# Patient Record
Sex: Female | Born: 1955
Health system: Southern US, Community
[De-identification: ages and names within clinical notes are randomized; demographics above are authoritative.]

## PROBLEM LIST (undated history)

## (undated) DIAGNOSIS — D689 Coagulation defect, unspecified: Secondary | ICD-10-CM

## (undated) DIAGNOSIS — S52123A Displaced fracture of head of unspecified radius, initial encounter for closed fracture: Secondary | ICD-10-CM

## (undated) DIAGNOSIS — I82409 Acute embolism and thrombosis of unspecified deep veins of unspecified lower extremity: Secondary | ICD-10-CM

## (undated) DIAGNOSIS — F32A Depression, unspecified: Secondary | ICD-10-CM

## (undated) DIAGNOSIS — K2210 Ulcer of esophagus without bleeding: Secondary | ICD-10-CM

## (undated) DIAGNOSIS — D6851 Activated protein C resistance: Secondary | ICD-10-CM

## (undated) DIAGNOSIS — T7840XA Allergy, unspecified, initial encounter: Secondary | ICD-10-CM

## (undated) DIAGNOSIS — C4491 Basal cell carcinoma of skin, unspecified: Secondary | ICD-10-CM

## (undated) DIAGNOSIS — K219 Gastro-esophageal reflux disease without esophagitis: Secondary | ICD-10-CM

## (undated) DIAGNOSIS — F419 Anxiety disorder, unspecified: Secondary | ICD-10-CM

## (undated) DIAGNOSIS — H269 Unspecified cataract: Secondary | ICD-10-CM

## (undated) DIAGNOSIS — K2970 Gastritis, unspecified, without bleeding: Secondary | ICD-10-CM

## (undated) DIAGNOSIS — M1990 Unspecified osteoarthritis, unspecified site: Secondary | ICD-10-CM

## (undated) DIAGNOSIS — E785 Hyperlipidemia, unspecified: Secondary | ICD-10-CM

## (undated) DIAGNOSIS — I10 Essential (primary) hypertension: Secondary | ICD-10-CM

## (undated) DIAGNOSIS — F329 Major depressive disorder, single episode, unspecified: Secondary | ICD-10-CM

## (undated) HISTORY — DX: Essential (primary) hypertension: I10

## (undated) HISTORY — DX: Acute embolism and thrombosis of unspecified deep veins of unspecified lower extremity: I82.409

## (undated) HISTORY — DX: Hyperlipidemia, unspecified: E78.5

## (undated) HISTORY — PX: SMALL INTESTINE SURGERY: SHX150

## (undated) HISTORY — DX: Gastritis, unspecified, without bleeding: K29.70

## (undated) HISTORY — DX: Ulcer of esophagus without bleeding: K22.10

## (undated) HISTORY — DX: Displaced fracture of head of unspecified radius, initial encounter for closed fracture: S52.123A

## (undated) HISTORY — PX: BREAST SURGERY: SHX581

## (undated) HISTORY — DX: Activated protein C resistance: D68.51

## (undated) HISTORY — PX: OOPHORECTOMY: SHX86

## (undated) HISTORY — DX: Anxiety disorder, unspecified: F41.9

## (undated) HISTORY — DX: Coagulation defect, unspecified: D68.9

## (undated) HISTORY — PX: UPPER GASTROINTESTINAL ENDOSCOPY: SHX188

## (undated) HISTORY — DX: Basal cell carcinoma of skin, unspecified: C44.91

## (undated) HISTORY — DX: Depression, unspecified: F32.A

## (undated) HISTORY — DX: Allergy, unspecified, initial encounter: T78.40XA

## (undated) HISTORY — PX: COLONOSCOPY: SHX174

## (undated) HISTORY — DX: Unspecified osteoarthritis, unspecified site: M19.90

## (undated) HISTORY — PX: CHOLECYSTECTOMY: SHX55

## (undated) HISTORY — DX: Unspecified cataract: H26.9

## (undated) HISTORY — DX: Gastro-esophageal reflux disease without esophagitis: K21.9

## (undated) HISTORY — DX: Major depressive disorder, single episode, unspecified: F32.9

---

## 1974-05-13 HISTORY — PX: TONSILLECTOMY: SUR1361

## 1988-05-13 DIAGNOSIS — K2970 Gastritis, unspecified, without bleeding: Secondary | ICD-10-CM

## 1988-05-13 HISTORY — DX: Gastritis, unspecified, without bleeding: K29.70

## 2003-01-26 ENCOUNTER — Encounter: Payer: Self-pay | Admitting: Family Medicine

## 2003-01-26 ENCOUNTER — Encounter: Admission: RE | Admit: 2003-01-26 | Discharge: 2003-01-26 | Payer: Self-pay | Admitting: Family Medicine

## 2003-01-27 ENCOUNTER — Encounter (INDEPENDENT_AMBULATORY_CARE_PROVIDER_SITE_OTHER): Payer: Self-pay | Admitting: Specialist

## 2003-01-27 ENCOUNTER — Observation Stay (HOSPITAL_COMMUNITY): Admission: AD | Admit: 2003-01-27 | Discharge: 2003-01-28 | Payer: Self-pay | Admitting: Surgery

## 2003-01-27 ENCOUNTER — Encounter: Payer: Self-pay | Admitting: Surgery

## 2003-03-17 ENCOUNTER — Encounter: Payer: Self-pay | Admitting: Internal Medicine

## 2003-05-03 ENCOUNTER — Encounter: Payer: Self-pay | Admitting: Internal Medicine

## 2003-05-03 LAB — HM COLONOSCOPY: HM Colonoscopy: NORMAL

## 2003-06-06 ENCOUNTER — Encounter: Payer: Self-pay | Admitting: Internal Medicine

## 2003-07-29 ENCOUNTER — Ambulatory Visit (HOSPITAL_COMMUNITY): Admission: RE | Admit: 2003-07-29 | Discharge: 2003-07-29 | Payer: Self-pay | Admitting: Internal Medicine

## 2003-08-31 ENCOUNTER — Ambulatory Visit (HOSPITAL_COMMUNITY): Admission: RE | Admit: 2003-08-31 | Discharge: 2003-08-31 | Payer: Self-pay | Admitting: Internal Medicine

## 2004-04-27 ENCOUNTER — Ambulatory Visit: Payer: Self-pay | Admitting: Internal Medicine

## 2004-05-15 ENCOUNTER — Ambulatory Visit: Payer: Self-pay | Admitting: Family Medicine

## 2005-02-21 ENCOUNTER — Ambulatory Visit: Payer: Self-pay | Admitting: Family Medicine

## 2005-02-22 ENCOUNTER — Encounter: Admission: RE | Admit: 2005-02-22 | Discharge: 2005-02-22 | Payer: Self-pay | Admitting: Family Medicine

## 2005-03-07 ENCOUNTER — Ambulatory Visit: Payer: Self-pay | Admitting: Family Medicine

## 2005-04-26 ENCOUNTER — Ambulatory Visit: Payer: Self-pay | Admitting: Family Medicine

## 2005-10-28 ENCOUNTER — Emergency Department (HOSPITAL_COMMUNITY): Admission: EM | Admit: 2005-10-28 | Discharge: 2005-10-28 | Payer: Self-pay | Admitting: Emergency Medicine

## 2006-03-18 ENCOUNTER — Ambulatory Visit: Payer: Self-pay | Admitting: Family Medicine

## 2006-04-12 HISTORY — PX: SHOULDER SURGERY: SHX246

## 2006-04-29 ENCOUNTER — Ambulatory Visit (HOSPITAL_BASED_OUTPATIENT_CLINIC_OR_DEPARTMENT_OTHER): Admission: RE | Admit: 2006-04-29 | Discharge: 2006-04-29 | Payer: Self-pay | Admitting: Orthopaedic Surgery

## 2006-05-01 ENCOUNTER — Ambulatory Visit: Payer: Self-pay | Admitting: Family Medicine

## 2006-05-04 ENCOUNTER — Emergency Department (HOSPITAL_COMMUNITY): Admission: EM | Admit: 2006-05-04 | Discharge: 2006-05-04 | Payer: Self-pay | Admitting: Emergency Medicine

## 2006-05-13 ENCOUNTER — Encounter: Payer: Self-pay | Admitting: Family Medicine

## 2006-05-13 DIAGNOSIS — I82409 Acute embolism and thrombosis of unspecified deep veins of unspecified lower extremity: Secondary | ICD-10-CM

## 2006-05-13 HISTORY — DX: Acute embolism and thrombosis of unspecified deep veins of unspecified lower extremity: I82.409

## 2006-05-13 LAB — CONVERTED CEMR LAB: Pap Smear: ABNORMAL

## 2006-05-14 LAB — HM HIV SCREENING LAB: HM HIV Screening: NEGATIVE

## 2006-06-05 ENCOUNTER — Encounter: Payer: Self-pay | Admitting: Vascular Surgery

## 2006-06-05 ENCOUNTER — Inpatient Hospital Stay (HOSPITAL_COMMUNITY): Admission: AD | Admit: 2006-06-05 | Discharge: 2006-06-07 | Payer: Self-pay | Admitting: Orthopaedic Surgery

## 2006-06-09 ENCOUNTER — Ambulatory Visit: Payer: Self-pay | Admitting: Family Medicine

## 2006-06-09 ENCOUNTER — Ambulatory Visit: Payer: Self-pay | Admitting: Internal Medicine

## 2006-06-09 LAB — CONVERTED CEMR LAB
Basophils Absolute: 0.1 10*3/uL (ref 0.0–0.1)
HCT: 39 % (ref 36.0–46.0)
Hemoglobin: 13.2 g/dL (ref 12.0–15.0)
Hgb A1c MFr Bld: 5.5 % (ref 4.6–6.0)
MCHC: 33.8 g/dL (ref 30.0–36.0)
Monocytes Relative: 6.8 % (ref 3.0–11.0)
Neutrophils Relative %: 58.4 % (ref 43.0–77.0)

## 2006-06-11 ENCOUNTER — Ambulatory Visit: Payer: Self-pay | Admitting: Family Medicine

## 2006-06-13 ENCOUNTER — Ambulatory Visit: Payer: Self-pay | Admitting: Family Medicine

## 2006-06-16 ENCOUNTER — Ambulatory Visit: Payer: Self-pay | Admitting: Family Medicine

## 2006-06-20 ENCOUNTER — Ambulatory Visit: Payer: Self-pay | Admitting: Family Medicine

## 2006-06-26 ENCOUNTER — Ambulatory Visit: Payer: Self-pay | Admitting: Family Medicine

## 2006-06-26 ENCOUNTER — Ambulatory Visit: Payer: Self-pay | Admitting: Internal Medicine

## 2006-06-30 ENCOUNTER — Ambulatory Visit: Payer: Self-pay | Admitting: Internal Medicine

## 2006-07-04 ENCOUNTER — Ambulatory Visit: Payer: Self-pay

## 2006-07-04 ENCOUNTER — Encounter: Payer: Self-pay | Admitting: Internal Medicine

## 2006-07-09 ENCOUNTER — Ambulatory Visit: Payer: Self-pay | Admitting: Family Medicine

## 2006-07-11 LAB — FECAL OCCULT BLOOD, GUAIAC: Fecal Occult Blood: NEGATIVE

## 2006-07-16 ENCOUNTER — Ambulatory Visit: Payer: Self-pay | Admitting: Internal Medicine

## 2006-07-16 ENCOUNTER — Ambulatory Visit: Payer: Self-pay | Admitting: Family Medicine

## 2006-08-06 ENCOUNTER — Ambulatory Visit: Payer: Self-pay | Admitting: Family Medicine

## 2006-08-07 ENCOUNTER — Ambulatory Visit: Payer: Self-pay | Admitting: Vascular Surgery

## 2006-08-07 ENCOUNTER — Ambulatory Visit (HOSPITAL_COMMUNITY): Admission: RE | Admit: 2006-08-07 | Discharge: 2006-08-07 | Payer: Self-pay | Admitting: Family Medicine

## 2006-08-07 ENCOUNTER — Encounter: Payer: Self-pay | Admitting: Vascular Surgery

## 2006-08-19 ENCOUNTER — Ambulatory Visit: Payer: Self-pay | Admitting: Family Medicine

## 2006-08-19 LAB — CONVERTED CEMR LAB
Cholesterol: 209 mg/dL (ref 0–200)
HDL: 51.7 mg/dL (ref >39.0)
Total CHOL/HDL Ratio: 4
Triglycerides: 118 mg/dL (ref 0–149)
VLDL: 24 mg/dL (ref 0–40)

## 2006-09-05 ENCOUNTER — Ambulatory Visit: Payer: Self-pay | Admitting: Family Medicine

## 2006-09-11 ENCOUNTER — Encounter: Payer: Self-pay | Admitting: Family Medicine

## 2006-09-12 ENCOUNTER — Telehealth (INDEPENDENT_AMBULATORY_CARE_PROVIDER_SITE_OTHER): Payer: Self-pay | Admitting: *Deleted

## 2006-09-12 ENCOUNTER — Ambulatory Visit: Payer: Self-pay | Admitting: Oncology

## 2006-10-01 ENCOUNTER — Ambulatory Visit: Payer: Self-pay | Admitting: Family Medicine

## 2006-10-01 LAB — CBC WITH DIFFERENTIAL (CANCER CENTER ONLY)
EOS%: 3 % (ref 0.0–7.0)
Eosinophils Absolute: 0.2 10*3/uL (ref 0.0–0.5)
HCT: 39.3 % (ref 34.8–46.6)
LYMPH#: 1.8 10*3/uL (ref 0.9–3.3)
MCV: 83 fL (ref 81–101)
MONO#: 0.2 10*3/uL (ref 0.1–0.9)
MONO%: 4 % (ref 0.0–13.0)
NEUT%: 59.1 % (ref 39.6–80.0)
Platelets: 176 10*3/uL (ref 145–400)
RBC: 4.72 10*6/uL (ref 3.70–5.32)
RDW: 12.6 % (ref 10.5–14.6)
WBC: 5.5 10*3/uL (ref 3.9–10.0)

## 2006-10-06 LAB — HYPERCOAGULABLE PANEL, COMPREHENSIVE
AntiThromb III Func: 102 % (ref 75–120)
Anticardiolipin IgA: <7 [APL'U] (ref <13)
Anticardiolipin IgG: <7 [GPL'U] (ref <11)
Anticardiolipin IgM: <7 [MPL'U] (ref <10)
Beta-2 Glyco I IgG: 82 U/mL (ref <20)
Beta-2-Glycoprotein I IgA: 29 U/mL (ref <10)
Beta-2-Glycoprotein I IgM: 42 U/mL (ref <10)
PTT Lupus Anticoagulant: 89.6 secs — ABNORMAL HIGH (ref 36.3–48.8)
PTTLA 4:1 Mix: 54.3 secs — ABNORMAL HIGH (ref 36.3–48.8)
PTTLA Confirmation: 0 secs (ref <8.0)
Protein C, Total: 77 % (ref 70–140)

## 2006-10-06 LAB — COMPREHENSIVE METABOLIC PANEL
ALT: 17 U/L (ref 0–35)
AST: 17 U/L (ref 0–37)
Alkaline Phosphatase: 79 U/L (ref 39–117)
Creatinine, Ser: 0.63 mg/dL (ref 0.40–1.20)
Sodium: 142 mEq/L (ref 135–145)
Total Bilirubin: 0.3 mg/dL (ref 0.3–1.2)
Total Protein: 6.6 g/dL (ref 6.0–8.3)

## 2006-10-06 LAB — FACTOR 5 LEIDEN: Activated Protein C Resistance: 0.47

## 2006-10-06 LAB — APTT: aPTT: 48 seconds — ABNORMAL HIGH (ref 24–37)

## 2006-10-08 ENCOUNTER — Ambulatory Visit: Payer: Self-pay | Admitting: Vascular Surgery

## 2006-10-29 ENCOUNTER — Ambulatory Visit: Payer: Self-pay | Admitting: Family Medicine

## 2006-10-29 DIAGNOSIS — E7800 Pure hypercholesterolemia, unspecified: Secondary | ICD-10-CM | POA: Insufficient documentation

## 2006-10-31 LAB — CONVERTED CEMR LAB
ALT: 14 units/L (ref 0–40)
AST: 17 units/L (ref 0–37)
Triglycerides: 64 mg/dL (ref 0–149)
VLDL: 13 mg/dL (ref 0–40)

## 2006-11-04 ENCOUNTER — Ambulatory Visit: Payer: Self-pay | Admitting: Internal Medicine

## 2006-11-05 ENCOUNTER — Encounter: Payer: Self-pay | Admitting: Family Medicine

## 2006-11-11 LAB — FACTOR V R2 DNA

## 2006-11-11 LAB — FACTOR 2 ASSAY: Factor II Activity: 19 % — ABNORMAL LOW (ref 81–115)

## 2006-11-19 ENCOUNTER — Ambulatory Visit: Payer: Self-pay | Admitting: Oncology

## 2006-11-20 ENCOUNTER — Encounter: Payer: Self-pay | Admitting: Family Medicine

## 2006-11-20 LAB — PROTIME-INR (CHCC SATELLITE)
INR: 3.2 (ref 2.0–3.5)
Protime: 38.4 Seconds — ABNORMAL HIGH (ref 10.6–13.4)

## 2006-11-25 ENCOUNTER — Encounter: Payer: Self-pay | Admitting: Oncology

## 2006-11-25 ENCOUNTER — Encounter: Payer: Self-pay | Admitting: Family Medicine

## 2006-11-25 ENCOUNTER — Ambulatory Visit: Payer: Self-pay | Admitting: Vascular Surgery

## 2006-11-25 ENCOUNTER — Ambulatory Visit (HOSPITAL_COMMUNITY): Admission: RE | Admit: 2006-11-25 | Discharge: 2006-11-25 | Payer: Self-pay | Admitting: Oncology

## 2006-11-25 DIAGNOSIS — K209 Esophagitis, unspecified without bleeding: Secondary | ICD-10-CM | POA: Insufficient documentation

## 2006-11-25 DIAGNOSIS — D239 Other benign neoplasm of skin, unspecified: Secondary | ICD-10-CM | POA: Insufficient documentation

## 2006-11-25 DIAGNOSIS — K2970 Gastritis, unspecified, without bleeding: Secondary | ICD-10-CM | POA: Insufficient documentation

## 2006-11-25 DIAGNOSIS — I471 Supraventricular tachycardia: Secondary | ICD-10-CM | POA: Insufficient documentation

## 2006-11-25 DIAGNOSIS — K2990 Gastroduodenitis, unspecified, without bleeding: Secondary | ICD-10-CM

## 2006-11-25 DIAGNOSIS — F418 Other specified anxiety disorders: Secondary | ICD-10-CM | POA: Insufficient documentation

## 2006-11-25 DIAGNOSIS — K589 Irritable bowel syndrome without diarrhea: Secondary | ICD-10-CM | POA: Insufficient documentation

## 2006-11-25 DIAGNOSIS — R945 Abnormal results of liver function studies: Secondary | ICD-10-CM | POA: Insufficient documentation

## 2006-11-25 DIAGNOSIS — Z87898 Personal history of other specified conditions: Secondary | ICD-10-CM | POA: Insufficient documentation

## 2006-11-25 DIAGNOSIS — F411 Generalized anxiety disorder: Secondary | ICD-10-CM | POA: Insufficient documentation

## 2006-11-25 DIAGNOSIS — Z86718 Personal history of other venous thrombosis and embolism: Secondary | ICD-10-CM | POA: Insufficient documentation

## 2006-11-26 ENCOUNTER — Ambulatory Visit: Payer: Self-pay | Admitting: Family Medicine

## 2006-11-26 LAB — CONVERTED CEMR LAB
Blood in Urine, dipstick: NEGATIVE
Glucose, Urine, Semiquant: NEGATIVE
Ketones, urine, test strip: NEGATIVE
Protein, U semiquant: NEGATIVE
WBC Urine, dipstick: NEGATIVE
pH: 7

## 2006-11-27 ENCOUNTER — Telehealth (INDEPENDENT_AMBULATORY_CARE_PROVIDER_SITE_OTHER): Payer: Self-pay | Admitting: *Deleted

## 2006-12-16 ENCOUNTER — Ambulatory Visit: Payer: Self-pay | Admitting: Family Medicine

## 2006-12-18 ENCOUNTER — Ambulatory Visit: Payer: Self-pay | Admitting: Family Medicine

## 2006-12-19 ENCOUNTER — Telehealth (INDEPENDENT_AMBULATORY_CARE_PROVIDER_SITE_OTHER): Payer: Self-pay | Admitting: *Deleted

## 2006-12-25 ENCOUNTER — Ambulatory Visit: Payer: Self-pay | Admitting: Family Medicine

## 2006-12-30 ENCOUNTER — Ambulatory Visit: Payer: Self-pay | Admitting: Family Medicine

## 2006-12-30 LAB — CONVERTED CEMR LAB
INR: 2.3
Prothrombin Time: 18.5 s

## 2007-01-27 ENCOUNTER — Ambulatory Visit: Payer: Self-pay | Admitting: Family Medicine

## 2007-01-27 LAB — CONVERTED CEMR LAB: Prothrombin Time: 17.3 s

## 2007-02-09 ENCOUNTER — Ambulatory Visit: Payer: Self-pay | Admitting: Family Medicine

## 2007-02-09 LAB — CONVERTED CEMR LAB
INR: 2.6
Prothrombin Time: 19.5 s

## 2007-02-23 ENCOUNTER — Ambulatory Visit: Payer: Self-pay | Admitting: Family Medicine

## 2007-02-23 LAB — CONVERTED CEMR LAB: INR: 2.4

## 2007-02-27 ENCOUNTER — Ambulatory Visit: Payer: Self-pay | Admitting: Family Medicine

## 2007-03-02 ENCOUNTER — Encounter: Payer: Self-pay | Admitting: Family Medicine

## 2007-03-03 ENCOUNTER — Ambulatory Visit: Payer: Self-pay | Admitting: Family Medicine

## 2007-03-18 ENCOUNTER — Telehealth: Payer: Self-pay | Admitting: Family Medicine

## 2007-03-20 ENCOUNTER — Telehealth (INDEPENDENT_AMBULATORY_CARE_PROVIDER_SITE_OTHER): Payer: Self-pay | Admitting: *Deleted

## 2007-03-20 ENCOUNTER — Encounter: Payer: Self-pay | Admitting: Family Medicine

## 2007-03-23 ENCOUNTER — Ambulatory Visit: Payer: Self-pay | Admitting: Family Medicine

## 2007-03-23 LAB — CONVERTED CEMR LAB: Prothrombin Time: 18.8 s

## 2007-04-20 ENCOUNTER — Ambulatory Visit: Payer: Self-pay | Admitting: Family Medicine

## 2007-04-20 LAB — CONVERTED CEMR LAB: INR: 2.2

## 2007-05-04 ENCOUNTER — Ambulatory Visit: Payer: Self-pay | Admitting: Family Medicine

## 2007-05-04 LAB — CONVERTED CEMR LAB: INR: 2.4

## 2007-05-14 LAB — CONVERTED CEMR LAB

## 2007-05-25 ENCOUNTER — Ambulatory Visit: Payer: Self-pay | Admitting: Oncology

## 2007-05-26 LAB — CBC WITH DIFFERENTIAL (CANCER CENTER ONLY)
BASO%: 0.9 % (ref 0.0–2.0)
EOS%: 2.3 % (ref 0.0–7.0)
Eosinophils Absolute: 0.2 10*3/uL (ref 0.0–0.5)
MCH: 28.4 pg (ref 26.0–34.0)
MCHC: 34 g/dL (ref 32.0–36.0)
MONO%: 5.9 % (ref 0.0–13.0)
NEUT#: 4.3 10*3/uL (ref 1.5–6.5)
Platelets: 207 10*3/uL (ref 145–400)
RBC: 4.82 10*6/uL (ref 3.70–5.32)
RDW: 12.4 % (ref 10.5–14.6)

## 2007-05-26 LAB — PROTIME-INR (CHCC SATELLITE): INR: 4.1 — ABNORMAL HIGH (ref 2.0–3.5)

## 2007-05-27 ENCOUNTER — Telehealth: Payer: Self-pay | Admitting: Family Medicine

## 2007-06-03 LAB — LUPUS ANTICOAGULANT PANEL: PTTLA Confirmation: 3.7 secs (ref <8.0)

## 2007-06-03 LAB — CARDIOLIPIN ANTIBODIES, IGG, IGM, IGA
Anticardiolipin IgG: <7 [GPL'U] (ref <11)
Anticardiolipin IgM: <7 [MPL'U] (ref <10)

## 2007-06-03 LAB — BETA-2 GLYCOPROTEIN ANTIBODIES
Beta-2 Glyco I IgG: <4 U/mL (ref <20)
Beta-2-Glycoprotein I IgA: <4 U/mL (ref <10)
Beta-2-Glycoprotein I IgM: 6 U/mL (ref <10)

## 2007-06-10 LAB — PROTIME-INR (CHCC SATELLITE): Protime: 27.6 Seconds — ABNORMAL HIGH (ref 10.6–13.4)

## 2007-06-19 ENCOUNTER — Encounter: Payer: Self-pay | Admitting: Oncology

## 2007-06-19 ENCOUNTER — Ambulatory Visit (HOSPITAL_COMMUNITY): Admission: RE | Admit: 2007-06-19 | Discharge: 2007-06-19 | Payer: Self-pay | Admitting: Oncology

## 2007-07-15 ENCOUNTER — Telehealth: Payer: Self-pay | Admitting: Family Medicine

## 2007-07-30 ENCOUNTER — Ambulatory Visit: Payer: Self-pay | Admitting: Oncology

## 2007-08-04 ENCOUNTER — Encounter: Payer: Self-pay | Admitting: Family Medicine

## 2007-09-28 ENCOUNTER — Telehealth: Payer: Self-pay | Admitting: Family Medicine

## 2007-10-07 ENCOUNTER — Encounter: Payer: Self-pay | Admitting: Family Medicine

## 2007-11-04 ENCOUNTER — Encounter: Payer: Self-pay | Admitting: Family Medicine

## 2007-12-01 ENCOUNTER — Ambulatory Visit: Payer: Self-pay | Admitting: Oncology

## 2007-12-03 ENCOUNTER — Encounter: Payer: Self-pay | Admitting: Family Medicine

## 2007-12-07 ENCOUNTER — Ambulatory Visit: Payer: Self-pay | Admitting: Family Medicine

## 2007-12-07 DIAGNOSIS — D6851 Activated protein C resistance: Secondary | ICD-10-CM | POA: Insufficient documentation

## 2007-12-08 ENCOUNTER — Telehealth: Payer: Self-pay | Admitting: Family Medicine

## 2007-12-09 ENCOUNTER — Telehealth (INDEPENDENT_AMBULATORY_CARE_PROVIDER_SITE_OTHER): Payer: Self-pay | Admitting: *Deleted

## 2007-12-15 ENCOUNTER — Encounter: Payer: Self-pay | Admitting: Family Medicine

## 2008-01-05 ENCOUNTER — Ambulatory Visit: Payer: Self-pay | Admitting: Family Medicine

## 2008-01-07 LAB — CONVERTED CEMR LAB
ALT: 16 units/L (ref 0–35)
AST: 17 units/L (ref 0–37)
Alkaline Phosphatase: 62 units/L (ref 39–117)
BUN: 11 mg/dL (ref 6–23)
Bilirubin, Direct: 0.1 mg/dL (ref 0.0–0.3)
CO2: 31 meq/L (ref 19–32)
Chloride: 112 meq/L (ref 96–112)
Eosinophils Relative: 3.8 % (ref 0.0–5.0)
GFR calc Af Amer: 113 mL/min
GFR calc non Af Amer: 93 mL/min
Glucose, Bld: 101 mg/dL — ABNORMAL HIGH (ref 70–99)
HCT: 37 % (ref 36.0–46.0)
Hemoglobin: 12.8 g/dL (ref 12.0–15.0)
Lymphocytes Relative: 29.1 % (ref 12.0–46.0)
Monocytes Relative: 5.3 % (ref 3.0–12.0)
Sodium: 144 meq/L (ref 135–145)
Total Bilirubin: 0.7 mg/dL (ref 0.3–1.2)

## 2008-03-16 ENCOUNTER — Ambulatory Visit: Payer: Self-pay | Admitting: Family Medicine

## 2008-04-04 ENCOUNTER — Ambulatory Visit: Payer: Self-pay | Admitting: Oncology

## 2008-04-11 ENCOUNTER — Encounter: Payer: Self-pay | Admitting: Family Medicine

## 2008-04-22 ENCOUNTER — Ambulatory Visit: Payer: Self-pay | Admitting: Family Medicine

## 2008-06-23 ENCOUNTER — Telehealth (INDEPENDENT_AMBULATORY_CARE_PROVIDER_SITE_OTHER): Payer: Self-pay | Admitting: Internal Medicine

## 2008-06-28 ENCOUNTER — Telehealth (INDEPENDENT_AMBULATORY_CARE_PROVIDER_SITE_OTHER): Payer: Self-pay | Admitting: Internal Medicine

## 2008-07-01 ENCOUNTER — Encounter (INDEPENDENT_AMBULATORY_CARE_PROVIDER_SITE_OTHER): Payer: Self-pay | Admitting: Internal Medicine

## 2008-09-09 ENCOUNTER — Telehealth: Payer: Self-pay | Admitting: Family Medicine

## 2008-09-29 ENCOUNTER — Ambulatory Visit: Payer: Self-pay | Admitting: Oncology

## 2008-10-03 ENCOUNTER — Encounter: Payer: Self-pay | Admitting: Family Medicine

## 2008-12-13 ENCOUNTER — Encounter: Payer: Self-pay | Admitting: Family Medicine

## 2008-12-30 ENCOUNTER — Telehealth: Payer: Self-pay | Admitting: Family Medicine

## 2009-02-09 ENCOUNTER — Ambulatory Visit: Payer: Self-pay | Admitting: Family Medicine

## 2009-04-14 ENCOUNTER — Ambulatory Visit: Payer: Self-pay | Admitting: Family Medicine

## 2009-04-14 DIAGNOSIS — K219 Gastro-esophageal reflux disease without esophagitis: Secondary | ICD-10-CM | POA: Insufficient documentation

## 2009-05-04 ENCOUNTER — Ambulatory Visit: Payer: Self-pay | Admitting: Family Medicine

## 2009-05-08 LAB — CONVERTED CEMR LAB
Alkaline Phosphatase: 62 units/L (ref 39–117)
Basophils Absolute: 0.1 10*3/uL (ref 0.0–0.1)
Basophils Relative: 1.3 % (ref 0.0–3.0)
Bilirubin, Direct: 0.1 mg/dL (ref 0.0–0.3)
CO2: 27 meq/L (ref 19–32)
Calcium: 9.3 mg/dL (ref 8.4–10.5)
Chloride: 108 meq/L (ref 96–112)
Eosinophils Absolute: 0.4 10*3/uL (ref 0.0–0.7)
Glucose, Bld: 86 mg/dL (ref 70–99)
HCT: 40 % (ref 36.0–46.0)
Hemoglobin: 13.4 g/dL (ref 12.0–15.0)
Lymphocytes Relative: 24.6 % (ref 12.0–46.0)
Monocytes Absolute: 0.4 10*3/uL (ref 0.1–1.0)
Potassium: 4.1 meq/L (ref 3.5–5.1)
RBC: 4.47 M/uL (ref 3.87–5.11)
Sodium: 141 meq/L (ref 135–145)
TSH: 1.74 microintl units/mL (ref 0.35–5.50)
WBC: 5.7 10*3/uL (ref 4.5–10.5)

## 2009-05-13 DIAGNOSIS — S52123A Displaced fracture of head of unspecified radius, initial encounter for closed fracture: Secondary | ICD-10-CM

## 2009-05-13 HISTORY — DX: Displaced fracture of head of unspecified radius, initial encounter for closed fracture: S52.123A

## 2009-05-13 LAB — CONVERTED CEMR LAB: Pap Smear: NORMAL

## 2009-05-22 ENCOUNTER — Ambulatory Visit: Payer: Self-pay | Admitting: Family Medicine

## 2009-05-22 DIAGNOSIS — M659 Unspecified synovitis and tenosynovitis, unspecified site: Secondary | ICD-10-CM | POA: Insufficient documentation

## 2009-05-22 DIAGNOSIS — M5430 Sciatica, unspecified side: Secondary | ICD-10-CM | POA: Insufficient documentation

## 2009-07-04 ENCOUNTER — Telehealth (INDEPENDENT_AMBULATORY_CARE_PROVIDER_SITE_OTHER): Payer: Self-pay | Admitting: *Deleted

## 2009-07-19 ENCOUNTER — Telehealth: Payer: Self-pay | Admitting: Family Medicine

## 2009-10-19 ENCOUNTER — Ambulatory Visit: Payer: Self-pay | Admitting: Family Medicine

## 2009-11-01 ENCOUNTER — Telehealth: Payer: Self-pay | Admitting: Family Medicine

## 2009-11-10 HISTORY — PX: MOHS SURGERY: SUR867

## 2009-11-23 ENCOUNTER — Encounter: Payer: Self-pay | Admitting: Family Medicine

## 2010-01-31 ENCOUNTER — Encounter: Payer: Self-pay | Admitting: Family Medicine

## 2010-02-17 ENCOUNTER — Ambulatory Visit: Payer: Self-pay | Admitting: Family Medicine

## 2010-03-15 ENCOUNTER — Telehealth: Payer: Self-pay | Admitting: Family Medicine

## 2010-03-22 ENCOUNTER — Ambulatory Visit: Payer: Self-pay | Admitting: Family Medicine

## 2010-05-02 ENCOUNTER — Telehealth: Payer: Self-pay | Admitting: Family Medicine

## 2010-05-15 ENCOUNTER — Encounter: Payer: Self-pay | Admitting: Internal Medicine

## 2010-05-30 ENCOUNTER — Ambulatory Visit
Admission: RE | Admit: 2010-05-30 | Discharge: 2010-05-30 | Payer: Self-pay | Source: Home / Self Care | Attending: Family Medicine | Admitting: Family Medicine

## 2010-05-30 ENCOUNTER — Other Ambulatory Visit: Payer: Self-pay | Admitting: Family Medicine

## 2010-05-30 DIAGNOSIS — S5290XA Unspecified fracture of unspecified forearm, initial encounter for closed fracture: Secondary | ICD-10-CM | POA: Insufficient documentation

## 2010-05-30 LAB — CBC WITH DIFFERENTIAL/PLATELET
Basophils Absolute: 0 10*3/uL (ref 0.0–0.1)
Basophils Relative: 0.5 % (ref 0.0–3.0)
Eosinophils Absolute: 0.2 10*3/uL (ref 0.0–0.7)
Eosinophils Relative: 2.9 % (ref 0.0–5.0)
HCT: 40.1 % (ref 36.0–46.0)
Hemoglobin: 13.4 g/dL (ref 12.0–15.0)
Lymphocytes Relative: 26.1 % (ref 12.0–46.0)
Lymphs Abs: 1.4 10*3/uL (ref 0.7–4.0)
MCHC: 33.4 g/dL (ref 30.0–36.0)
MCV: 87 fl (ref 78.0–100.0)
Monocytes Absolute: 0.4 10*3/uL (ref 0.1–1.0)
Monocytes Relative: 8.3 % (ref 3.0–12.0)
Neutro Abs: 3.3 10*3/uL (ref 1.4–7.7)
Neutrophils Relative %: 62.2 % (ref 43.0–77.0)
Platelets: 173 10*3/uL (ref 150.0–400.0)
RBC: 4.61 Mil/uL (ref 3.87–5.11)
RDW: 13.3 % (ref 11.5–14.6)
WBC: 5.3 10*3/uL (ref 4.5–10.5)

## 2010-05-30 LAB — HEPATIC FUNCTION PANEL
ALT: 18 U/L (ref 0–35)
AST: 17 U/L (ref 0–37)
Albumin: 3.9 g/dL (ref 3.5–5.2)
Alkaline Phosphatase: 72 U/L (ref 39–117)
Bilirubin, Direct: 0.1 mg/dL (ref 0.0–0.3)
Total Bilirubin: 0.5 mg/dL (ref 0.3–1.2)
Total Protein: 6.5 g/dL (ref 6.0–8.3)

## 2010-05-30 LAB — BASIC METABOLIC PANEL
BUN: 13 mg/dL (ref 6–23)
CO2: 30 mEq/L (ref 19–32)
Calcium: 9.5 mg/dL (ref 8.4–10.5)
Chloride: 108 mEq/L (ref 96–112)
Creatinine, Ser: 0.8 mg/dL (ref 0.4–1.2)
GFR: 82.49 mL/min (ref >60.00)
Glucose, Bld: 106 mg/dL — ABNORMAL HIGH (ref 70–99)
Potassium: 4.8 mEq/L (ref 3.5–5.1)
Sodium: 142 mEq/L (ref 135–145)

## 2010-05-30 LAB — TSH: TSH: 1.93 u[IU]/mL (ref 0.35–5.50)

## 2010-06-07 ENCOUNTER — Telehealth: Payer: Self-pay | Admitting: Internal Medicine

## 2010-06-08 ENCOUNTER — Encounter: Payer: Self-pay | Admitting: Family Medicine

## 2010-06-08 ENCOUNTER — Ambulatory Visit
Admission: RE | Admit: 2010-06-08 | Discharge: 2010-06-08 | Payer: Self-pay | Source: Home / Self Care | Attending: Family Medicine | Admitting: Family Medicine

## 2010-06-12 NOTE — Progress Notes (Signed)
Summary: t  Phone Note Call from Patient Call back at Home Phone [phone]   Caller: Patient Call For: Dr. Dayton Martes Summary of Call: Patient was seen about 3 weeks ago with respiratory symptoms.  She was given a Z-Pak.  She has never really gotten back to normal.  The symptoms improved but now are exacerbating again.  She has a raspy voice, throat hurts, ears hurt, especially the right one (up a few hours last night with a heating pad on it), lymph nodes in her neck were tender to touch over the weekend.  She took a Sudafed last night but only takes it sparingly because of her BP.  She also has taken a Claritin.  ? additional ABX.   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  [facility] [place] Initial call taken by: Delilah Shan CMA Duncan Dull),  November 01, 2009 9:41 AM  Follow-up for Phone Call        We can try 5 day course of Avelox. Ruthe Mannan MD  November 01, 2009 9:54 AM     New/Updated Medications: AVELOX 400 MG  TABS (MOXIFLOXACIN HCL) 1 tablet by mouth daily Prescriptions: AVELOX 400 MG  TABS (MOXIFLOXACIN HCL) 1 tablet by mouth daily  #5 x 0   Entered and Authorized by:   Ruthe Mannan MD   Signed by:   Ruthe Mannan MD on 11/01/2009   Method used:   Electronically [facility]        [facility]  [place] (retail)       [address]       [place], [place]  [address]       Ph: [phone] or [phone]       Fax: [phone]   RxID:   [id number]   Appended Document: t Patient advised via message left on cell phone voicemail, Rx sent [facility].

## 2010-06-12 NOTE — Progress Notes (Signed)
Summary: Bupropion XL  Phone Note Refill Request Message from:  [employer]line on March 15, 2010 12:29 PM  Refills Requested: Medication #1:  BUPROPION HCL 150 MG XR12H-TAB Take 1 tablet by mouth once a day. This was not originally on the meds list, added for refill purposes.   [facility]  [id number]   Last Fill Date:  No date sent   Pharmacy Phone:  [phone]   Method Requested: Electronic Initial call taken by: Delilah Shan CMA Duncan Dull),  March 15, 2010 12:29 PM  Follow-up for Phone Call        she has been on and off of it - verify she is back on  px written on EMR for call in  Follow-up by: Judith Part MD,  March 15, 2010 12:33 PM  Additional Follow-up for Phone Call Additional follow up Details #1::        Patient notified as instructed by telephone. Pt is taking Buproprion. [facility]  pharmacy as instructed. Lewanda Rife LPN  March 15, 2010 3:09 PM     New/Updated Medications: BUPROPION HCL 150 MG XR12H-TAB (BUPROPION HCL) Take 1 tablet by mouth once a day Prescriptions: BUPROPION HCL 150 MG XR12H-TAB (BUPROPION HCL) Take 1 tablet by mouth once a day  #30 x 11   Entered and Authorized by:   Judith Part MD   Signed by:   Lewanda Rife LPN on 63/87/5643   Method used:   Telephoned to ...       [facility]  [place] (retail)       [address]       [place], [place]  [address]       Ph: [phone] or [phone]       Fax: [phone]   RxID:   [id number]

## 2010-06-12 NOTE — Letter (Signed)
Summary: [facility]  [facility]   Imported By: Lanelle Bal 12/04/2009 09:47:00  _____________________________________________________________________  External Attachment:    Type:   Image     Comment:   External Document  Appended Document: [facility]     Clinical Lists Changes  Observations: Added new observation of PAST MED HX: DVT 1/08      Anxiety Depression DVT, hx of factor V Leiden heterozygote (on coag 1 year ending 09) basal cel carcinoma face       hematology---Dr Park Breed derm  (12/04/2009 22:00) Added new observation of PAST SURG HX: Cholecystectomy Oophorectomy Hospital- abd pain, gastritis (1990) EGD- esoph erosion Flex sig 9?1996) EGD- GERD, esophagitis (05/2002) Colonoscopy- neg (04/2003) Cardiolite- neg (04/2003) Heel dexa (10/20050 Shoulder surgery (04/2006) Stress cardiolite- neg  2D Echo- neg (06/2006) Moh's surgery for basal cell carcinoma near eye 7/11  (12/04/2009 22:00)       Past Medical History:    DVT 1/08         Anxiety    Depression    DVT, hx of    factor V Leiden heterozygote (on coag 1 year ending 09)    basal cel carcinoma face                            hematology---Dr Marlynn Perking   Past Surgical History:    Cholecystectomy    Oophorectomy    Hospital- abd pain, gastritis (1990)    EGD- esoph erosion    Flex sig 9?1996)    EGD- GERD, esophagitis (05/2002)    Colonoscopy- neg (04/2003)    Cardiolite- neg (04/2003)    Heel dexa (10/20050    Shoulder surgery (04/2006)    Stress cardiolite- neg  2D Echo- neg (06/2006)    Moh's surgery for basal cell carcinoma near eye 7/11

## 2010-06-12 NOTE — Assessment & Plan Note (Signed)
Summary: ST,EARS,EYES,COUGH/CLE   Vital Signs:  Patient profile:   55 year old female Height:      65.25 inches Temp:     98.7 degrees F oral Pulse rate:   68 / minute Pulse rhythm:   regular BP sitting:   130 / 90  (left arm) Cuff size:   regular  Vitals Entered By: Linde Gillis CMA Duncan Dull) (October 19, 2009 9:09 AM) CC: sore throat, ear pain, cough Comments patient refused to weigh   History of Present Illness: 55 yo with 9 days of URI symptoms, progressively worsening.  Started with a runny nose, congestion. Now coughing up phlegm, sinus pressure. No shortness of breath or wheezing.  Ears are popping and hurting.  Has tried Mucinex and cough suppressant.  Current Medications (verified): 1)  Zocor 20 Mg Tabs (Simvastatin) .Marland Kitchen.. 1 By Mouth Once Daily in Evening With A Low Fat Snack 2)  Allegra 180 Mg  Tabs (Fexofenadine Hcl) .... Take One By Mouth Daily As Needed 3)  Bayer Aspirin Ec Low Dose 81 Mg  Tbec (Aspirin) .Marland Kitchen.. 1 By Mouth Once Daily 4)  Mucinex Dm Maximum Strength 60-1200 Mg Xr12h-Tab (Dextromethorphan-Guaifenesin) .... Otc As Directed. 5)  Buspirone Hcl 15 Mg Tabs (Buspirone Hcl) .... 1/2 By Mouth Two Times A Day 6)  Prednisone 20 Mg Tabs (Prednisone) .... 2 Tabs By Mouth Daily 7)  Kenalog in Orabase Gel .... Apply To Cold Sore/ Mouth Ulcer Three Times A Day As Needed 8)  Azithromycin 250 Mg  Tabs (Azithromycin) .... 2 By  Mouth Today and Then 1 Daily For 4 Days  Allergies: 1)  ! Augmentin 2)  ! Erythromycin 3)  Zoloft 4)  Naprosyn 5)  * Calcium 6)  * Guaifenesin 7)  Augmentin 8)  Percocet 9)  Percodan  Review of Systems      See HPI General:  Complains of fever; denies chills. ENT:  Complains of earache, nasal congestion, postnasal drainage, sinus pressure, and sore throat. CV:  Denies chest pain or discomfort. Resp:  Complains of cough and sputum productive; denies shortness of breath and wheezing. GI:  Denies abdominal pain and change in bowel  habits.  Physical Exam  General:  Well-developed,well-nourished,in no acute distress; alert,appropriate and cooperative throughout examination Ears:  TMs retracted bilaterally Nose:  boggy turbinates frontal sinuses TTP Mouth:  Oral mucosa and oropharynx without lesions or exudates.  Teeth in good repair. Lungs:  normal respiratory effort.   no crackles and no wheezes.   Heart:  Normal rate and regular rhythm. S1 and S2 normal without gallop, murmur, click, rub or other extra sounds. Abdomen:  Bowel sounds positive,abdomen soft and non-tender without masses, organomegaly or hernias noted. no renal bruits  Extremities:  no c/c/e Psych:  Cognition and judgment appear intact. Alert and cooperative with normal attention span and concentration. No apparent delusions, illusions, hallucinations   Impression & Recommendations:  Problem # 1:  ACUTE FRONTAL SINUSITIS (ICD-461.1) Assessment New Given duration and progression of symptoms, will treat for bacterial sinusitis. has multiple abx allergies, using zpack. Her updated medication list for this problem includes:    Mucinex Dm Maximum Strength 60-1200 Mg Xr12h-tab (Dextromethorphan-guaifenesin) ..... Otc as directed.    Azithromycin 250 Mg Tabs (Azithromycin) .Marland Kitchen... 2 by  mouth today and then 1 daily for 4 days  Complete Medication List: 1)  Zocor 20 Mg Tabs (Simvastatin) .Marland Kitchen.. 1 by mouth once daily in evening with a low fat snack 2)  Allegra 180 Mg Tabs (Fexofenadine hcl) .Marland KitchenMarland KitchenMarland Kitchen  Take one by mouth daily as needed 3)  Bayer Aspirin Ec Low Dose 81 Mg Tbec (Aspirin) .Marland Kitchen.. 1 by mouth once daily 4)  Mucinex Dm Maximum Strength 60-1200 Mg Xr12h-tab (Dextromethorphan-guaifenesin) .... Otc as directed. 5)  Buspirone Hcl 15 Mg Tabs (Buspirone hcl) .... 1/2 by mouth two times a day 6)  Prednisone 20 Mg Tabs (Prednisone) .... 2 tabs by mouth daily 7)  Kenalog in Orabase Gel  .... Apply to cold sore/ mouth ulcer three times a day as needed 8)   Azithromycin 250 Mg Tabs (Azithromycin) .... 2 by  mouth today and then 1 daily for 4 days  Patient Instructions: 1)  Take Zpack as directed.  Drink lots of fluids.  Treat sympotmatically with Mucinex, nasal saline irrigation, and Tylenol/Ibuprofen. Also try claritin D or zyrtec D over the counter- two times a day as needed ( have to sign for them at pharmacy). You can use warm compresses.  Cough suppressant at night. Call if not improving as expected in 5-7 days.  Prescriptions: AZITHROMYCIN 250 MG  TABS (AZITHROMYCIN) 2 by  mouth today and then 1 daily for 4 days  #6 x 0   Entered and Authorized by:   Ruthe Mannan MD   Signed by:   Ruthe Mannan MD on 10/19/2009   Method used:   [facility]ronically to        [facility]  [place] (retail)       [address]       [place], [place]  [address]       Ph: [phone] or [phone]       Fax: [phone]   RxID:   [id number]   Current Allergies (reviewed today): ! AUGMENTIN ! ERYTHROMYCIN ZOLOFT NAPROSYN * CALCIUM * GUAIFENESIN AUGMENTIN PERCOCET PERCODAN

## 2010-06-12 NOTE — Progress Notes (Signed)
Summary: has fever blister  Phone Note Call from Patient Call back at Work Phone [phone]   Caller: Patient Call For: Judith Part MD Summary of Call: Pt states she has had a fever blister since monday morning and has a sore at the tip of her tongue since last night. She has had some mouth ulcers but they have cleared up.  She is asking if you recommend something otc or rx?  [facility]. Initial call taken by: Lowella Petties CMA,  July 19, 2009 10:26 AM  Follow-up for Phone Call        can try some kenalog in orabase gel - is not a cure but may speed up healing update me if not imp/ f/u px written on EMR for call in  Follow-up by: Judith Part MD,  July 19, 2009 11:48 AM  Additional Follow-up for Phone Call Additional follow up Details #1::        [facility] pt[facility] Additional Follow-up by: Lowella Petties CMA,  July 19, 2009 12:43 PM    New/Updated Medications: * KENALOG IN ORABASE GEL apply to cold sore/ mouth ulcer three times a day as needed Prescriptions: KENALOG IN ORABASE GEL apply to cold sore/ mouth ulcer three times a day as needed  #1 small x 1   Entered and Authorized by:   Judith Part MD   Signed by:   Lowella Petties CMA on 07/19/2009   Method used:   Telephoned to ...       [facility]  [place] (retail)       [address]       [place], [place]  [address]       Ph: [phone] or [phone]       Fax: [phone]   RxID:   [id number]

## 2010-06-12 NOTE — Assessment & Plan Note (Signed)
Summary: CONGESTION/DLO   Vital Signs:  Patient profile:   55 year old female Height:      65.25 inches Weight:      198 pounds BMI:     32.82 O2 Sat:      98 % Temp:     97.2 degrees F oral Pulse rate:   86 / minute BP sitting:   140 / 98  (left arm) Cuff size:   regular  Vitals Entered By: Jarome Lamas (February 17, 2010 12:08 PM) CC: sinus infection/pb   History of Present Illness: uri for 9 days - getting worse instead of better  thursday ached everywhere and a little diarrhea that is impoved  some tight chest and upper back - but not wheezing  cough prod of colored mucous --deep and rattly  headache and facial pain are worsening   no temp right now   is congested generally  blood and colored nasal mucous   working and trying to push through   tried some chlor tabs otc  today bp up a bit    Allergies: 1)  ! Augmentin 2)  ! Erythromycin 3)  Zoloft 4)  Naprosyn 5)  * Calcium 6)  * Guaifenesin 7)  Augmentin 8)  Percocet 9)  Percodan  Past History:  Past Medical History: Last updated: 12/04/2009 DVT 1/08      Anxiety Depression DVT, hx of factor V Leiden heterozygote (on coag 1 year ending 09) basal cel carcinoma face       hematology---Dr Park Breed derm   Past Surgical History: Last updated: 12/04/2009 Cholecystectomy Oophorectomy Hospital- abd pain, gastritis (1990) EGD- esoph erosion Flex sig 9?1996) EGD- GERD, esophagitis (05/2002) Colonoscopy- neg (04/2003) Cardiolite- neg (04/2003) Heel dexa (10/20050 Shoulder surgery (04/2006) Stress cardiolite- neg  2D Echo- neg (06/2006) Moh's surgery for basal cell carcinoma near eye 7/11  Family History: Last updated: 05/04/2009 Father: 3 MI's, CABG, CAD, DM, chol, PVD, HTN Mother: Grave's disease, depression, osteoporosis  Siblings: brother with MI' x2, CABG, DM, brother with HTN, sister with breast cancer, sister with HTN brother PE/blood clots, CAD with stents , atrial fib  bother died    Social History: Last updated: 12/07/2007 Marital Status: Married Children: 1 Occupation: nurse some walking for exercise  never smoked  rare alcohol   Review of Systems General:  Complains of chills, fatigue, fever, and malaise. Eyes:  Denies blurring and eye irritation. ENT:  Complains of nasal congestion, postnasal drainage, sinus pressure, and sore throat. CV:  Denies chest pain or discomfort and palpitations. Resp:  Complains of cough and sputum productive; denies pleuritic and shortness of breath. GI:  Complains of diarrhea; denies abdominal pain, nausea, and vomiting. Derm:  Denies itching and rash.  Physical Exam  General:  fatigued but well appearing  Head:  normocephalic, atraumatic, and no abnormalities observed.  bilat ethmoid and maxillary sinus tenderness   Eyes:  vision grossly intact, pupils equal, pupils round, pupils reactive to light, and no injection.   Mouth:  pharynx pink and moist.   Neck:  supple with full rom and no masses or thyromegally, no JVD or carotid bruit  Lungs:  Normal respiratory effort, chest expands symmetrically. Lungs are clear to auscultation, no crackles or wheezes. Heart:  Normal rate and regular rhythm. S1 and S2 normal without gallop, murmur, click, rub or other extra sounds. Skin:  Intact without suspicious lesions or rashes Cervical Nodes:  No lymphadenopathy noted Psych:  normal affect, talkative and pleasant    Impression &  Recommendations:  Problem # 1:  SINUSITIS - ACUTE-NOS (ICD-461.9) Assessment New  s/p uri with facial pain and malaise  pcn all  tx with levaquin and update recommend sympt care- see pt instructions   pt advised to update me if symptoms worsen or do not improve  Her updated medication list for this problem includes:    Mucinex Dm Maximum Strength 60-1200 Mg Xr12h-tab (Dextromethorphan-guaifenesin) ..... Otc as directed.    Levaquin 500 Mg Tabs (Levofloxacin) .Marland Kitchen... 1 by mouth once daily for 10  days  Orders: Prescription Created Electronically [phone])  Complete Medication List: 1)  Zocor 20 Mg Tabs (Simvastatin) .Marland Kitchen.. 1 by mouth once daily in evening with a low fat snack 2)  Allegra 180 Mg Tabs (Fexofenadine hcl) .... Take one by mouth daily as needed 3)  Bayer Aspirin Ec Low Dose 81 Mg Tbec (Aspirin) .Marland Kitchen.. 1 by mouth once daily 4)  Mucinex Dm Maximum Strength 60-1200 Mg Xr12h-tab (Dextromethorphan-guaifenesin) .... Otc as directed. 5)  Buspirone Hcl 15 Mg Tabs (Buspirone hcl) .... 1/2 by mouth two times a day 6)  Prednisone 20 Mg Tabs (Prednisone) .... 2 tabs by mouth daily 7)  Kenalog in Orabase Gel  .... Apply to cold sore/ mouth ulcer three times a day as needed 8)  Nexium 40 Mg Pack (Esomeprazole magnesium) .... Take one tablet daily 9)  Levaquin 500 Mg Tabs (Levofloxacin) .Marland Kitchen.. 1 by mouth once daily for 10 days  Patient Instructions: 1)  you can try mucinex over the counter twice daily as directed and nasal saline spray for congestion 2)  tylenol over the counter as directed may help with aches, headache and fever 3)  call if symptoms worsen or if not improved in 4-5 days  4)  take the levaquin as directed  Prescriptions: LEVAQUIN 500 MG TABS (LEVOFLOXACIN) 1 by mouth once daily for 10 days  #10 x 0   Entered and Authorized by:   Judith Part MD   Signed by:   Judith Part MD on 02/17/2010   Method used:   [facility]ically [facility]        [facility]  [place] (retail)       [address]       [place], [place]  [address]       Ph: [phone] or [phone]       Fax: [phone]   RxID:   [id number]

## 2010-06-12 NOTE — Progress Notes (Signed)
Summary: [place] [employer]  Phone Note Call from Patient Call back at [phone]   Caller: Patient Call For: [facility] Summary of Call: Pt. called to find out the date of her appt. w/ Dr.Copland.  I told her her appt. was yesterday.  She said she was waiting for the teleminder to call her,but never received the call and she didn't get the teleminder call for her last appt..   I switched her home # to her cell phone# to see if she gets the call for her next appt.. Initial call taken by: Beau Fanny,  July 04, 2009 9:51 AM

## 2010-06-12 NOTE — Assessment & Plan Note (Signed)
Summary: FLU SHOT/CLE   Nurse Visit   Allergies: 1)  ! Augmentin 2)  ! Erythromycin 3)  Zoloft 4)  Naprosyn 5)  * Calcium 6)  * Guaifenesin 7)  Augmentin 8)  Percocet 9)  Percodan  Orders Added: 1)  Admin 1st Vaccine [[address]] 2)  Flu Vaccine 76yrs + [[address]] Flu Vaccine Consent Questions     Do you have a history of severe allergic reactions to this vaccine? no    Any prior history of allergic reactions to egg and/or gelatin? no    Do you have a sensitivity to the preservative Thimersol? no    Do you have a past history of Guillan-Barre Syndrome? no    Do you currently have an acute febrile illness? no    Have you ever had a severe reaction to latex? no    Vaccine information given and explained to patient? yes    Are you currently pregnant? no    Lot Number:[id number]   Exp Date:11/10/2010   Site Given  Left Deltoid IM1

## 2010-06-12 NOTE — Assessment & Plan Note (Signed)
Summary: ARM and WRIST PAIN - ?TENDONITIS PER DR. Milinda Antis REFERRAL / [facility]   Vital Signs:  Patient profile:   55 year old female Height:      65.25 inches Weight:      200 pounds BMI:     33.15 Temp:     98.1 degrees F oral Pulse rate:   76 / minute Pulse rhythm:   regular BP sitting:   130 / 80  (left arm) Cuff size:   large  Vitals Entered By: Benny Lennert CMA Duncan Dull) (May 22, 2009 3:32 PM)  History of Present Illness: Chief complaint right elbow and wrist pain  55 year old female seen at the request of Dr. Milinda Antis for evaluation of right wrist and elbow pain:  1. Right and Left wrist pain: Starting August, was picking up some heavy charts for work repetitively and has been having pain in the wrists with forced deviation in multiple directions. No specific trauma or inciting event. No discrete swelling. Has been ongoing intermittently for months, recently flared with doing work outside.  3. Lateral epicondylitis, R: Over the last couple of months, has a deep pain in the lateral aspect of elbow, notably made worse with supination and flexion of the arm with the hand is in pronation. No trauma or accident. No known prior elbow injury. Points to maximal pain at the ECRB and forearm extensors.   RHD Had a partial thickness RTC tear, Glennis Brink did what sounds to be Arthroscopic SAD, DCE at that time.  4. Sciatica. Has had intermittent sciatica as well, decribes her buttocks pain and occaisional radiation pain down the posterior aspect of her thigh.  Allergies: 1)  ! Augmentin 2)  ! Erythromycin 3)  Zoloft 4)  Naprosyn 5)  * Calcium 6)  * Guaifenesin 7)  Augmentin 8)  Percocet 9)  Percodan  Past History:  Past medical, surgical, family and social histories (including risk factors) reviewed, and no changes noted (except as noted below).  Past Medical History: Reviewed history from 12/07/2007 and no changes required. DVT 1/08      Anxiety Depression DVT, hx of factor  V Leiden heterozygote (on coag 1 year ending 09)       hematology---Dr Marlynn Perking   Past Surgical History: Reviewed history from 11/25/2006 and no changes required. [facility]- abd pain, gastritis (1990) EGD- esoph erosion Flex sig 9?1996) EGD- GERD, esophagitis (05/2002) Colonoscopy- neg (04/2003) Cardiolite- neg (04/2003) Heel dexa (10/20050 Shoulder surgery (04/2006) Stress cardiolite- neg  2D Echo- neg (06/2006)  Family History: Reviewed history from 05/04/2009 and no changes required. Father: 3 MI's, CABG, CAD, DM, chol, PVD, HTN Mother: Grave's disease, depression, osteoporosis  Siblings: brother with MI' x2, CABG, DM, brother with HTN, sister with breast cancer, sister with HTN brother PE/blood clots, CAD with stents , atrial fib  bother died   Social History: Reviewed history from 12/07/2007 and no changes required. Marital Status: Married Children: 1 Occupation: Engineer, civil (consulting) some walking for exercise  never smoked  rare alcohol   Review of Systems       REVIEW OF SYSTEMS  GEN: No systemic complaints, no fevers, chills, sweats, or other acute illnesses MSK: Detailed in the HPI GI: tolerating PO intake without difficulty Neuro: No numbness, parasthesias, but radiculopathy is noted Otherwise, the pertinent positives and negatives are listed above and in the HPI, otherwise a full review of systems has been reviewed and is negative unless noted positive.   Physical Exam  General:  Well-developed,well-nourished,in  no acute distress; alert,appropriate and cooperative throughout examination Head:  Normocephalic and atraumatic without obvious abnormalities. No apparent alopecia or balding. Eyes:  vision grossly intact.   Ears:  no external deformities.   Nose:  no external deformity.   Mouth:  Oral mucosa and oropharynx without lesions or exudates.  Teeth in good repair. Neck:  No deformities, masses, or tenderness noted. Lungs:  normal  respiratory effort.   Msk:  R elbow Ecchymosis or edema: neg ROM: full flexion, extension, pronation, supination Shoulder ROM: Full Flexion: 5/5 Extension: 5/5 Supination: 5/5  Pronation: 5/5 Wrist ext: 4+/5, causes pain Wrist flexion: 5/5 No gross bony abnormality Varus and Valgus stress: stable ECRB tenderness: pos Medial epicondyle: NT Lateral epicondyle, resisted wrist extension from wrist full pronation and flexion: painful grip: 5/5 Resisted supination: painful  sensation intact Extremities:  no c/c/e Neurologic:  alert & oriented X3 and gait normal.   Skin:  no rashes.   Psych:  Cognition and judgment appear intact. Alert and cooperative with normal attention span and concentration. No apparent delusions, illusions, hallucinations Additional Exam:  B hand Ecchymosis or edema: neg ROM wrist/hand/digits/elbow: full  Carpals, MCP's, digits: NT Distal Ulna and Radius: NT Ecchymosis or edema: neg Cysts/nodules: neg Finkelstein's test: neg Snuffbox tenderness: neg Scaphoid tubercle: NT Hook of Hamate: NT Resisted supination: pain Full composite fist Grip, all digits: 5/5 str - mild thenar wasting No tenosynovitis Axial load test: mild TTP with some fullness in doral synovium  Hand sensation: intact    Impression & Recommendations:  Problem # 1:  LATERAL EPICONDYLITIS, RIGHT (ICD-726.32) Assessment New Elbow anatomy was reviewed, and tendinopathy was explained.  Pt. given a formal rehab program from Dr. Caralee Ates at [facility] and the [facility] on elbow rehabiliation.   Series of concentric and eccentric exercises should be done starting with no weight, work up to 1 lb, hammer, etc. Use counterforce strap if working or using hands - given aircast strap  Formal PT would be beneficial. Emphasized stretching an cross-friction massage Emphasized proper palms up lifting biomechanics to unload ECRB  cc: Dr. Milinda Antis  Problem # 2:  WRIST PAIN, BILATERAL  (ZOX-096.04) Assessment: New I think she is having some chronic dorsal synovitis B from repetitive motion.  Often in these cases, direct wrist joint injection helpful, but with B wrists, several month history of LE and sciatica, systemic steroids likely of more benefit in this case.  Oral steroid burst x 1 week  Problem # 3:  UNSPECIFIED SYNOVITIS AND TENOSYNOVITIS (ICD-727.00) Assessment: New B dorsal synovitis  Problem # 4:  SCIATICA (ICD-724.3) Mild sciatica signs, would treat conservatively, likely will get some benefit from steroids, core work and weight loss would help more than anything.  Her updated medication list for this problem includes:    Bayer Aspirin Ec Low Dose 81 Mg Tbec (Aspirin) .Marland Kitchen... 1 by mouth once daily  Complete Medication List: 1)  Zocor 20 Mg Tabs (Simvastatin) .Marland Kitchen.. 1 by mouth once daily in evening with a low fat snack 2)  Wellbutrin Xl 150 Mg Tb24 (Bupropion hcl) .... Take one by mouth daily 3)  Allegra 180 Mg Tabs (Fexofenadine hcl) .... Take one by mouth daily as needed 4)  Bayer Aspirin Ec Low Dose 81 Mg Tbec (Aspirin) .Marland Kitchen.. 1 by mouth once daily 5)  Mucinex Dm Maximum Strength 60-1200 Mg Xr12h-tab (Dextromethorphan-guaifenesin) .... Otc as directed. 6)  Buspirone Hcl 15 Mg Tabs (Buspirone hcl) .... 1/2 by mouth two times a day 7)  Prednisone 20  Mg Tabs (Prednisone) .... 2 tabs by mouth daily  Patient Instructions: 1)  f/u 6 weeks Prescriptions: PREDNISONE 20 MG TABS (PREDNISONE) 2 tabs by mouth daily  #14 x 0   Entered and Authorized by:   Hannah Beat MD   Signed by:   Hannah Beat MD on 05/22/2009   Method used:   [facility]        [facility]  [address] [facility]       [address]       [place], [place]  [address]       Ph: [phone] or [phone]       Fax: [phone]   RxID:   [id number]   Current Allergies (reviewed today): ! AUGMENTIN ! ERYTHROMYCIN ZOLOFT NAPROSYN * CALCIUM *  GUAIFENESIN AUGMENTIN PERCOCET PERCODAN

## 2010-06-14 NOTE — Assessment & Plan Note (Signed)
Summary: CPX/CLE  R/S FROM 05/24/10   Vital Signs:  Patient profile:   55 year old female Height:      65.25 inches Weight:      201.50 pounds BMI:     33.40 Temp:     98.3 degrees F oral Pulse rate:   84 / minute Pulse rhythm:   regular BP sitting:   130 / 80  (left arm) Cuff size:   large  Vitals Entered By: Selena Batten Dance CMA Duncan Dull) (May 30, 2010 8:01 AM) CC: CPx, Back Pain   History of Present Illness: here for wellness exam and to review chronic med problems  wt is up 3 lb  is doing well overall  moh's surgery in july and has to have another one  fell and had radial head fx in july -- that is healed  sciatica and bursitis  L knee has been a problem -- pain comes and goes -- ? what dx is  PT for back 2-3 times this year - disc issue  did start back walking several weeks ago and got recumbant bike  mixing up the exercise  working on loosing wt  is starting back with wt watchers meetings   is working on sinus infection  has sinus headache on and off for 2 weeks  lots of pressure in head  clear mucous  using saline nasal spray  lots of congestion throat is scratchy  ears ok  little irritating cough  thinks it started with allergies     bmi is 33  bp today 130/80  past hx of oophrectomy- one side  pap lgsil in 09- then was normal jan 2011  had colp in office  f/u jan 31   mam scheduled next week  last one nl in jan  no lumps on self exam  colonosc --? 04/ ? due   dexa 10/05 normal wants to have OP screening  lost 2 inches in past 3 year  fam hx OP in mother  she has personally fx radius this year -- from a hard fall  ortho wanted her to have this done   Td 09 flu -- had that in nov  ptx up to date will consider zostavax when 59   lipids-on zocor  is working on very healthy diet   labs [facility] from 9/21 trig 90/ HDL 65 and LDL 86 -- excellent  glucose 95  due for labs today   Allergies: 1)  ! Augmentin 2)  ! Erythromycin 3)   Zoloft 4)  Naprosyn 5)  * Calcium 6)  * Guaifenesin 7)  Augmentin 8)  Percocet 9)  Percodan  Past History:  Past Surgical History: Last updated: 12/04/2009 [facility]- abd pain, gastritis (1990) EGD- esoph erosion Flex sig 9?1996) EGD- GERD, esophagitis (05/2002) Colonoscopy- neg (04/2003) Cardiolite- neg (04/2003) Heel dexa (10/20050 Shoulder surgery (04/2006) Stress cardiolite- neg  2D Echo- neg (06/2006) Moh's surgery for basal cell carcinoma near eye 7/11  Family History: Last updated: 05/04/2009 Father: 3 MI's, CABG, CAD, DM, chol, PVD, HTN Mother: Grave's disease, depression, osteoporosis  Siblings: brother with MI' x2, CABG, DM, brother with HTN, sister with breast cancer, sister with HTN brother PE/blood clots, CAD with stents , atrial fib  bother died   Social History: Last updated: 12/07/2007 Marital Status: Married Children: 1 Occupation: nurse some walking for exercise  never smoked  rare alcohol   Past Medical History: DVT 1/08      Anxiety Depression DVT, hx  of factor V Leiden heterozygote (on coag 1 year ending 09) basal cel carcinoma face mult basal cell  radial head fx 2011     hematology---Dr Park Breed derm  ortho -- Dr Yisroel Ramming  [facility] Dr [facility]   Review of Systems General:  Denies fatigue, fever, loss of appetite, and malaise. Eyes:  Denies blurring and eye irritation. ENT:  Complains of earache, nasal congestion, postnasal drainage, and sinus pressure. CV:  Denies chest pain or discomfort and lightheadness. Resp:  Denies cough, shortness of breath, and wheezing. GI:  Denies abdominal pain, change in bowel habits, indigestion, and nausea. MS:  Complains of joint pain and stiffness; denies muscle aches and cramps. Derm:  Denies itching, lesion(s), poor wound healing, and rash. Neuro:  Denies numbness and tingling. Psych:  Complains of depression and irritability; denies panic attacks and  suicidal thoughts/plans. Heme:  Denies abnormal bruising and bleeding.  Physical Exam  General:  fatigued but well appearing , overwt  Head:  normocephalic, atraumatic, and no abnormalities observed.  bilat ethmoid and maxillary sinus tenderness   Eyes:  vision grossly intact, pupils equal, pupils round, pupils reactive to light, and no injection.   Ears:  TMs retracted bilaterally Nose:  nares are injected and congested bilaterally  Mouth:  pharynx pink and moist.   Neck:  supple with full rom and no masses or thyromegally, no JVD or carotid bruit  Chest Wall:  No deformities, masses, or tenderness noted. Lungs:  Normal respiratory effort, chest expands symmetrically. Lungs are clear to auscultation, no crackles or wheezes. Heart:  Normal rate and regular rhythm. S1 and S2 normal without gallop, murmur, click, rub or other extra sounds. Abdomen:  Bowel sounds positive,abdomen soft and non-tender without masses, organomegaly or hernias noted. no renal bruits  Msk:  No deformity or scoliosis noted of thoracic or lumbar spine.   Pulses:  R and L carotid,radial,femoral,dorsalis pedis and posterior tibial pulses are full and equal bilaterally Extremities:  no c/c/e Neurologic:  strength normal in all extremities, sensation intact to pinprick, gait normal, and DTRs symmetrical and normal.   Skin:  Intact without suspicious lesions or rashes Cervical Nodes:  No lymphadenopathy noted Inguinal Nodes:  No significant adenopathy Psych:  normal affect, talkative and pleasant  seems generally fatigued and stressed    Impression & Recommendations:  Problem # 1:  HEALTH MAINTENANCE EXAM (ICD-V70.0) Assessment Comment Only reviewed health habits including diet, exercise and skin cancer prevention reviewed health maintenance list and family history labs reviewed  wellness labs ordered  Orders: Venipuncture (14782) TLB-BMP (Basic Metabolic Panel-BMET) (80048-METABOL) TLB-CBC Platelet -  w/Differential (85025-CBCD) TLB-Hepatic/Liver Function Pnl (80076-HEPATIC) TLB-TSH (Thyroid Stimulating Hormone) (84443-TSH)  Problem # 2:  FAMILY HISTORY OSTEOPOROSIS (ICD-V17.81) Assessment: New along with hx of radial fx and ht loss sched dexa - copy to ortho rev ca and vit D Orders: Radiology Referral (Radiology)  Problem # 3:  GASTROESOPHAGEAL REFLUX DISEASE (ICD-530.81) Assessment: Unchanged  well controlled with nexium - no changes  Her updated medication list for this problem includes:    Nexium 40 Mg Pack (Esomeprazole magnesium) .Marland Kitchen... Take one tablet as needed  Diagnostics Reviewed:  Discussed lifestyle modifications, diet, antacids/medications, and preventive measures. Handout provided.   Problem # 4:  DEPRESSION (ICD-311) Assessment: Unchanged  much stress at work  disc coping skills doing ok on current meds  is thinking about leaving job Her updated medication list for this problem includes:    Buspirone Hcl 15 Mg Tabs (Buspirone hcl) .Marland Kitchen... 1/2  by mouth as needed    Bupropion Hcl 150 Mg Xr12h-tab (Bupropion hcl) .Marland Kitchen... Take 1 tablet by mouth once a day    Alprazolam 0.25 Mg Tabs (Alprazolam) .Marland Kitchen... 1 by mouth once daily as needed  Orders: Prescription Created Electronically [phone])  Problem # 5:  SINUSITIS, ACUTE (ICD-461.9) Assessment: New  with facial pain for 2 weeks following flare of allergies  tx with levaquin and update recommend sympt care- see pt instructions   The following medications were removed from the medication list:    Levaquin 500 Mg Tabs (Levofloxacin) .Marland Kitchen... 1 by mouth once daily for 10 days Her updated medication list for this problem includes:    Mucinex Dm Maximum Strength 60-1200 Mg Xr12h-tab (Dextromethorphan-guaifenesin) ..... Otc as directed.    Levaquin 500 Mg Tabs (Levofloxacin) .Marland Kitchen... 1 by mouth once daily for 10 days  Orders: Prescription Created Electronically [phone])  Complete Medication List: 1)  Zocor 20 Mg Tabs  (Simvastatin) .Marland Kitchen.. 1 by mouth once daily in evening with a low fat snack 2)  Allegra 180 Mg Tabs (Fexofenadine hcl) .... Take one by mouth daily as needed 3)  Bayer Aspirin Ec Low Dose 81 Mg Tbec (Aspirin) .Marland Kitchen.. 1 by mouth once daily 4)  Mucinex Dm Maximum Strength 60-1200 Mg Xr12h-tab (Dextromethorphan-guaifenesin) .... Otc as directed. 5)  Buspirone Hcl 15 Mg Tabs (Buspirone hcl) .... 1/2 by mouth as needed 6)  Kenalog in Orabase Gel  .... Apply to cold sore/ mouth ulcer three times a day as needed 7)  Nexium 40 Mg Pack (Esomeprazole magnesium) .... Take one tablet as needed 8)  Bupropion Hcl 150 Mg Xr12h-tab (Bupropion hcl) .... Take 1 tablet by mouth once a day 9)  Alprazolam 0.25 Mg Tabs (Alprazolam) .Marland Kitchen.. 1 by mouth once daily as needed 10)  Calcium 1200 1200-1000 Mg-unit Chew (Calcium carbonate-vit d-min) .Marland Kitchen.. 1 by mouth once daily 11)  Vitamin D 1000 Unit Tabs (Cholecalciferol) .Marland Kitchen.. 1 by mouth once daily 12)  Coq-10 30 Mg Caps (Coenzyme q10) .Marland Kitchen.. 1 by mouth once daily 13)  Levaquin 500 Mg Tabs (Levofloxacin) .Marland Kitchen.. 1 by mouth once daily for 10 days  Patient Instructions: 1)  please call GI to ask why colonosc is due  2)  labs today  3)  good luck with the healthy diet and exercise  4)  you can try mucinex over the counter twice daily as directed and nasal saline spray for congestion 5)  tylenol over the counter as directed may help with aches, headache and fever 6)  call if symptoms worsen or if not improved in 4-5 days 7)  take levaquin for sinus infection - update me if any problems or if not better  8)  we will schedule dexa at check out  Prescriptions: LEVAQUIN 500 MG TABS (LEVOFLOXACIN) 1 by mouth once daily for 10 days  #10 x 0   Entered and Authorized by:   Judith Part MD   Signed by:   Judith Part MD on 05/30/2010   Method used:   [facility]        [facility] [facility]       [address]       [place], [place]  [address]       Ph:  [phone] or [phone]       Fax: [phone]   RxID:   [id number] BUPROPION HCL 150 MG XR12H-TAB (BUPROPION HCL) Take 1 tablet by mouth once a day  #90 x 3  Entered and Authorized by:   Judith Part MD   Signed by:   Judith Part MD on 05/30/2010   Method used:   [facility]        [id number]       [address]       [place], [place]  [address]       Ph: [phone] or [phone]       Fax: [phone]   RxID:   [id number]    Orders Added: 1)  Venipuncture [51884] 2)  TLB-BMP (Basic Metabolic Panel-BMET) [80048-METABOL] 3)  TLB-CBC Platelet - w/Differential [85025-CBCD] 4)  TLB-Hepatic/Liver Function Pnl [80076-HEPATIC] 5)  TLB-TSH (Thyroid Stimulating Hormone) [84443-TSH] 6)  Radiology Referral [Radiology] 7)  Prescription Created Electronically [G8553] 8)  Est. Patient 40-64 years [99396] 9)  Est. Patient Level II [16606]    Current Allergies (reviewed today): ! AUGMENTIN ! ERYTHROMYCIN ZOLOFT NAPROSYN * CALCIUM * GUAIFENESIN AUGMENTIN PERCOCET PERCODAN   Preventive Care Screening  Mammogram:    Date:  05/13/2009    Results:  normal   Pap Smear:    Date:  05/13/2009    Results:  normal

## 2010-06-14 NOTE — Miscellaneous (Signed)
Summary: EGD  Clinical Lists Changes     Patient Name: Kristine [name], Kristine [name] MRN:  Procedure Procedures: Panendoscopy (EGD) CPT: 43235.  Personnel: Endoscopist: Iva Boop, MD, [facility].  Referred By: Roxy Manns, MD.  Exam Location: Exam performed in Outpatient Clinic. Outpatient  Patient Consent: Procedure, Alternatives, Risks and Benefits discussed, consent obtained, from patient. Consent was obtained by the RN.  Indications Symptoms: Chest Pain. Abdominal pain, location: epigastric.  History  Current Medications: Patient is not currently taking Coumadin.  Comments: On protonix 40 mg/day. Pre-Exam Physical: Performed Mar 17, 2003  Cardio-pulmonary exam, HEENT exam, Abdominal exam, Mental status exam WNL.  Exam Exam Info: Maximum depth of insertion Duodenum, intended Duodenum. Vocal cords not visualized. Gastric retroflexion performed. Images taken. ASA Classification: II. Tolerance: good.  Sedation Meds: Patient assessed and found to be appropriate for moderate (conscious) sedation. Sedation was managed by the Endoscopist. Fentanyl 100 mcg. given IV. Versed 8 mg. given IV. Cetacaine Spray 2 sprays given aerosolized.  Monitoring: BP and pulse monitoring done. Oximetry used. Supplemental O2 given  Findings - Normal: Proximal Esophagus to Mid Esophagus.  - Normal: Fundus to Duodenal 2nd Portion.  ESOPHAGEAL INFLAMMATION: suspected as a result of reflux. Severity is moderate, erosions present.  Length of inflammation: 0.5 cm. [place] Classification: Grade A. ICD9: Esophagitis, Reflux: 530.11.   Assessment Abnormal examination, see findings above.  Diagnoses: 530.11: Esophagitis, Reflux.   Events  Unplanned Intervention: No unplanned interventions were required.  Plans Comments: Will try Nexium instead of Protonix. If that fails to help may need to search for non-reflux causes of symptoms. Disposition: After procedure patient sent to recovery. After  recovery patient sent home.  Scheduling: Clinic Visit, to Iva Boop, MD, [facility], 4-6 weeks   This report was created from the original endoscopy report, which was reviewed and signed by the above listed endoscopist. .

## 2010-06-14 NOTE — Letter (Signed)
Summary: Colonoscopy Letter  [facility]  [address]   [place], [place] [address]   Phone: [phone]  Fax: [phone]      May 15, 2010 MRN: [id number]   [facility] [address] [place], [place]  [address]   Dear Ms. Sermons,   According to your medical record, it is time for you to schedule a Colonoscopy. The American Cancer Society recommends this procedure as a method to detect early colon cancer. Patients with a family history of colon cancer, or a personal history of colon polyps or inflammatory bowel disease are at increased risk.  This letter has been generated based on the recommendations made at the time of your procedure. If you feel that in your particular situation this may no longer apply, please contact our office.  Please call our office at [phone] to schedule this appointment or to update your records at your earliest convenience.  Thank you for cooperating with [place] to provide you with the very best care possible.   Sincerely,   Stan Head, M.D.  [facility]astro[facility] [phone]

## 2010-06-14 NOTE — Miscellaneous (Signed)
Summary: BONE DENSITY  Clinical Lists Changes  Orders: Added new Test order of T-Bone Densitometry (77080) - Signed Added new Test order of T-Lumbar Vertebral Assessment (77082) - Signed 

## 2010-06-14 NOTE — Progress Notes (Signed)
Summary: requests something for fever blister  Phone Note Call from Patient Call back at Home Phone [phone]   Caller: Patient Call For: Judith Part MD Summary of Call: Pt states she has just had a fever blister to come up and she is asking if [facility] be [facility].  She says she has never taken anything for fever blisters before but they are becoming more frequent and tend to last for a long time. Initial call taken by: Lowella Petties CMA, AAMA,  May 02, 2010 9:13 AM  Follow-up for Phone Call        lets try zovirax ointment and let me know if that helps px written on EMR for call in  Follow-up by: Judith Part MD,  May 02, 2010 11:29 AM  Additional Follow-up for Phone Call Additional follow up Details #1::        [facility]. Left message for patient to call back. Lewanda Rife LPN  May 02, 2010 11:45 AM   Patient notified as instructed by telephone. Lewanda Rife LPN  May 02, 2010 1:38 PM     New/Updated Medications: ZOVIRAX 5 % OINT (ACYCLOVIR) apply to cold sore 5 times daily for 5 days Prescriptions: ZOVIRAX 5 % OINT (ACYCLOVIR) apply to cold sore 5 times daily for 5 days  #1 small x 2   Entered by:   Lewanda Rife LPN   Authorized by:   Judith Part MD   Signed by:   Lewanda Rife LPN on 29/51/8841   Method used:   [facility]        [facility]ail)       [address]       [place], [place]  [address]       Ph: [phone] or [phone]       Fax: [phone]   RxID:   [id number]

## 2010-06-25 ENCOUNTER — Encounter (INDEPENDENT_AMBULATORY_CARE_PROVIDER_SITE_OTHER): Payer: Self-pay | Admitting: *Deleted

## 2010-06-28 ENCOUNTER — Encounter: Payer: Self-pay | Admitting: Family Medicine

## 2010-07-04 NOTE — Letter (Signed)
Summary: Results Follow up Letter  Lee at [facility]  [address] [place], [place] [address]   Phone: [phone]  Fax: [phone]    06/25/2010 MRN: [id number]  [facility] [address] [place], [place]  [address]  Dear Ms. Marcos,  The following are the results of your recent test(s):  Test         Result    Pap Smear:        Normal _____  Not Normal _____ Comments: ______________________________________________________ Cholesterol: LDL(Bad cholesterol):         Your goal is less than:         HDL (Good cholesterol):       Your goal is more than: Comments:  ______________________________________________________ Mammogram:        Normal _____  Not Normal _____ Comments:  ___________________________________________________________________ Hemoccult:        Normal _____  Not normal _______ Comments:    _____________________________________________________________________ Other Tests: Bone Density Scan- within normal limits. Take a calcium and vitamin D supplement to help continue with good bone health and strength.    We routinely do not discuss normal results over the telephone.  If you desire a copy of the results, or you have any questions about this information we can discuss them at your next office visit.   Sincerely,     Sharilyn Sites for Dr. Roxy Manns

## 2010-07-04 NOTE — Progress Notes (Signed)
Summary: Questions   Phone Note Call from Patient Call back at Home Phone [phone]   Caller: Patient Call For: Dr. Leone Payor Reason for Call: Talk to Nurse Summary of Call: Pt would like to speak with nurse about why she is required to have another colon now, she thought it would be 10 years Initial call taken by: [place] Johnson,  June 07, 2010 10:39 AM  Follow-up for Phone Call        Spoke with patient and will call her back when I get a fax of COLON from Dr Royden Purl office. Report is not in our system, but ENDO is. COLON  done 05/03/03 per Dr Royden Purl office. Patient stated understanding. Graciella Freer RN  June 07, 2010 11:52 AM   Notified patient I received the fax, but I've sent for the chart. Patient stated understanding. Graciella Freer RN  June 07, 2010 1:21 PM     Additional Follow-up for Phone Call Additional follow up Details #1::        also get me paper chart Iva Boop MD, [facility]  June 07, 2010 12:27 PM     Additional Follow-up for Phone Call Additional follow up Details #2::    LMOM that I'm now waiting for ENDO chart to come from the warehouse and I should know something next week.Graciella Freer RN  June 08, 2010 4:16 PM   Left ENDO chart for Dr Leone Payor to review. Thanks.Graciella Freer RN  June 14, 2010 4:44 PM  Looks like I recommended 7 years due to brother having had colon polyps. Based upon guidelines now, unless we know for sure her brother truly had pre-cancerous polyps  - she can wait for 10 years (2014) and that's probably ok anyway. A gray area of recommendation. Let me know what she decides Follow-up by: Iva Boop MD, Clementeen Graham,  June 15, 2010 4:08 PM  Additional Follow-up for Phone Call Additional follow up Details #3:: Details for Additional Follow-up Action Taken: Notified patient of Dr Marvell Fuller recommendations. Patient will check with her brother and call us back. Graciella Freer RN  June 18, 2010 8:31 AM     Appended Document: Questions waiting on patient to call back  Appended Document: Questions Spoke with patient who stated her brother is has polyps present on each Colonoscopy- another due next month.Patient has decided to go ahead and have the Colonoscopy. At present, she is having MOHS surgery on her face and will call us to schedule the COLON.

## 2010-07-10 NOTE — Miscellaneous (Signed)
Summary: COLONOSCOPY  Patient Name: Kristine [name], Kristine [name] MRN:  Procedure Procedures: Colonoscopy CPT: 11914.    with biopsy. CPT: Q5068410.  Personnel: Endoscopist: Iva Boop, MD, [facility].  Referred By: Roxy Manns, MD.  Exam Location: Exam performed in Outpatient Clinic. Outpatient  Patient Consent: Procedure, Alternatives, Risks and Benefits discussed, consent obtained, from patient. Consent was obtained by the RN.  Indications Symptoms: Diarrhea Abdominal pain / bloating.  Increased Risk Screening: Family History of Polyps.  Comments: Brother had colon polyps History  Current Medications: Patient is not currently taking Coumadin.  Pre-Exam Physical: Performed May 03, 2003. Cardio-pulmonary exam, Rectal exam, HEENT exam , Abdominal exam, Mental status exam WNL.  Exam Exam: Extent of exam reached: Terminal Ileum, extent intended: Terminal Ileum.  The cecum was identified by appendiceal orifice and IC valve. Patient position: on left side. Colon retroflexion performed. Images taken. ASA Classification: II. Tolerance: good.  Monitoring: Pulse and BP monitoring, Oximetry used. Supplemental O2 given.  Colon Prep Used MiraLax for colon prep. Dose Used: 64 oz. Prep results: excellent.  Sedation Meds: Patient assessed and found to be appropriate for moderate (conscious) sedation. Sedation was managed by the Endoscopist. Fentanyl 100 mcg. given IV. Versed 7 mg. given IV.  Findings - NORMAL EXAM: Terminal Ileum to Rectum. Biopsy/Normal Exam taken. Comments: Random colon biopsies taken.   Assessment Normal examination.  Events  Unplanned Interventions: No intervention was required.  Plans Medication Plan: Await pathology.  Disposition: After procedure patient sent to recovery. After recovery patient sent home.  Scheduling/Referral: Await pathology to schedule patient.  Comments: I will call results of biopsies and nuclear stress test and decide further  plans.   CC: Roxy Manns, MD     The Patient     Kristine [name]. Daphine Deutscher, MD This report was created from the original endoscopy report, which was reviewed and signed by the above listed endoscopist.    Clinical Lists Changes

## 2010-07-10 NOTE — Progress Notes (Signed)
Summary: Stan Head MD  Stan Head MD   Imported By: Lester Creekside 07/04/2010 07:53:46  _____________________________________________________________________  External Attachment:    Type:   Image     Comment:   External Document

## 2010-07-16 ENCOUNTER — Telehealth: Payer: Self-pay | Admitting: Family Medicine

## 2010-07-17 ENCOUNTER — Encounter: Payer: Self-pay | Admitting: Family Medicine

## 2010-07-19 NOTE — Letter (Signed)
Summary: [facility]  [facility]   Imported By: Maryln Gottron 07/09/2010 15:44:03  _____________________________________________________________________  External Attachment:    Type:   Image     Comment:   External Document

## 2010-07-24 NOTE — Letter (Signed)
Summary: Generic Letter  Rico at [facility]  [address] [place], [place] [address]   Phone: [phone]  Fax: [phone]    07/17/2010  Elnora Bunton [address] [place], [place]  [address]  To whom it may concern,   My patient Kristine [name] is a post menupausal female who suffered from a radial fracture this year and also has a strong family history of osteoporosis (including her mother).  For these reasons I feel she should be screened for osteoporosis with a dexa scan.  Please call if any questions.     Sincerely,   Roxy Manns MD

## 2010-07-24 NOTE — Progress Notes (Signed)
Summary: Needs note for bone densitiy diagnosis  Phone Note Call from Patient Call back at [phone]   Caller: Patient Call For: Judith Part MD Summary of Call: Pt needs note explaining  why she needs osteoporosis screening. Insurance denied claim  but pt can appeal with a note from Dr Milinda Antis. Pt thinks family hx and pt's fracture history from this past year should be reason enough. Pt would like note or letter mailed to her home address. Initial call taken by: Lewanda Rife LPN,  July 16, 2010 4:04 PM  Follow-up for Phone Call        letter done in IN box Follow-up by: Judith Part MD,  July 17, 2010 1:14 PM  Additional Follow-up for Phone Call Additional follow up Details #1::        Patient notified as instructed by telephone. Pt said she would pick letter up at front desk instead of mailing. Pt also wanted Dr Milinda Antis to know that the insurance co said the only dx on the claim form was ovarian failure. Pt hopes this letter will cause ins. co to take care of claim.Lewanda Rife LPN  July 16, 1189 1:39 PM     Additional Follow-up for Phone Call Additional follow up Details #2::    I said she was post menopausal -- that indicates ovarian failure Follow-up by: Judith Part MD,  July 17, 2010 3:46 PM  Additional Follow-up for Phone Call Additional follow up Details #3:: Details for Additional Follow-up Action Taken: Patient notified as instructed by telephone. Lewanda Rife LPN  July 16, 4780 5:19 PM

## 2010-09-25 NOTE — Consult Note (Signed)
VASCULAR SURGERY CONSULTATION   [name], [name]  DOB:  08-Mar-1956                                       10/08/2006  CHART#:[id number]   I saw Kristine [name] in the office today in consultation concerning a DVT  of the left lower extremity.  She was referred by Dr. Jerl Santos.  This is  a pleasant 55 year old woman who had undergone surgery on her right  shoulder in December and had done well from that standpoint.  She fell  shortly after surgery and injured her left foot.  The foot was  temporarily in a splint and then subsequently a boot.  She subsequently  developed some left calf pain and on May 13, 2007, underwent a  venous duplex which showed DVT involving the posterior tibial veins and  popliteal vein on the left side.  She was placed on Coumadin.  She did  have a followup duplex scan on August 07, 2006, which showed some chronic  clot within the posterior tibial vein and popliteal vein on the left.  They had also noted a clot involving the distal femoral vein, although  it would be very difficult to determine exactly where the popliteal vein  ended and the femoral vein began.  I do not think the tech was  suggesting the clot had propagated.  Subsequent to this, she has been  seen by a hematologist (Dr. Welton Flakes) and a hypercoagulable workup is in  progress.   Prior to this she had had no history of DVT or phlebitis.  Her only  symptom has been some aching pain in the leg associated with standing  and sometimes with walking.  She has had no fever, no chest pain or  shortness of breath.   PAST MEDICAL HISTORY:  Significant for hypercholesterolemia.  She denies  any history of diabetes, hypertension, history of previous myocardial  infarction, or history of congestive heart failure.  She had a history  of SVT in the remote past.   FAMILY HISTORY:  She did have  brother who had a pulmonary embolus after  knee surgery.  Her father apparently required an  amputation related to a  complication of an intra-aortic balloon pump after open-heart surgery.   SOCIAL HISTORY:  She is married.  She is a Astronomer.  She does not use  tobacco.   REVIEW OF SYSTEMS AND MEDICATIONS:  Noted on the medical history form in  her chart.   PHYSICAL EXAMINATION:  Blood pressure is 143/92, heart rate is 94.  I do  not detect any carotid bruits.  Lungs are clear bilaterally to  auscultation.  On cardiac exam, she has a regular rate and rhythm.  Abdomen is soft and nontender.  I cannot palpate an aneurysm.  She has  palpable femoral, popliteal, and pedal pulses bilaterally.  She has mild  left lower extremity swelling.  She has no significant hyperpigmentation  on either leg.   I did review her studies that were on CD that she brought with her.  It  would be very difficult to determine from the study without actually  being there whether or not the clot had propagated; however, I think  that most likely there is no evidence of clot propagation, and certainly  if there was it was only minimal.   The patient has been on Coumadin since January and  I have explained that  I would agree with at least 6 months of Coumadin.  If her  hypercoagulable workup comes back positive, obviously she may require  life-time anticoagulation.  She is followed by her hematologist at  [facility].  We have discussed the importance of intermittent leg  elevation and I have also written her a prescription for compression  stockings to help lower her risk of chronic problems with venous  insufficiency.  I would recommend a followup duplex scan in July, which  would be 6 months from her initial positive study, to look for continued  recanalization.   I will be happy to see her back at any time if any new vascular problems  arise.   Di Kindle. Edilia Bo, M.D.  Electronically Signed  CSD/MEDQ  D:  10/08/2006  T:  10/08/2006  Job:  [id number]

## 2010-09-28 NOTE — Op Note (Signed)
NAME:  Kristine [name], Kristine [name]          ACCOUNT NO.:  [id number]   MEDICAL RECORD NO.:  [id number]          PATIENT TYPE:  AMB   LOCATION:  DSC                          FACILITY:  [facility]   PHYSICIAN:  Lubertha Basque. Dalldorf, M.D.DATE OF BIRTH:  05/01/1956   DATE OF PROCEDURE:  04/29/2006  DATE OF DISCHARGE:                               OPERATIVE REPORT   PREOPERATIVE DIAGNOSIS:  Right shoulder impingement.   POSTOPERATIVE DIAGNOSIS:  Right shoulder impingement.   PROCEDURES:  1. Right shoulder arthroscopic acromioplasty.  2. Right shoulder arthroscopic debridement.   ANESTHESIA:  General and block.   ATTENDING SURGEON:  Lubertha Basque. Jerl Santos, M.D.   ASSISTANT:  Lindwood Qua, P.A.   INDICATIONS FOR PROCEDURE:  The patient is a 55 year old woman with a  long history of right shoulder pain.  This has persisted despite an  exercise program and a subacromial injection, which did afford her about  3 weeks of relief.  An MRI scan shows things consistent with impingement  and partial-thickness cuff tearing but certainly no full-thickness tear.  She has maintained good strength.  Unfortunately, she has pain which  limits her ability to use her arm and her ability to rest.  She is  offered an arthroscopy.  Informed operative consent was obtained after  discussion of the possible complications of reaction to anesthesia and  infection.   SUMMARY, FINDINGS AND PROCEDURE:  Under general anesthesia and a block,  an arthroscopy of the right shoulder was performed.  The glenohumeral  joint showed no degenerative changes, and the biceps tendon and rotator  cuff appeared benign from below.  In the subacromial space she had some  significant bursitis and perhaps a focal area of partial-thickness cuff  tear addressed with a debridement.  She had a prominent subacromial  morphology addressed with an acromioplasty.  Bryna Colander assisted  throughout and was invaluable to the completion of the case in that  he  helped position and retract and pass instruments while I performed the  procedure.   DESCRIPTION OF PROCEDURE:  The patient went to the operating suite,  where a general anesthetic was applied without difficulty.  She was also  given a block in the preanesthesia area.  She was positioned in a beach-  chair position and prepped and draped in normal sterile fashion.  After  the administration of IV Kefzol, an arthroscopy of the right shoulder  was performed through a total of two portals.  Findings were as noted  above and the procedure consisted of debridement of the bursa and small  partial-thickness cuff tear, followed by an acromioplasty.  The  acromioplasty was done with the bur in the lateral position, followed by  transfer of the bur to the posterior position.  The [facility] joint was  addressed with a brief co-plane but no formal AC decompression was felt  to be needed.  The shoulder was thoroughly irrigated.  Simple sutures of  nylon were used to loosely reapproximate the portals, followed by  Adaptic and a dry gauze dressing with tape.  Estimated blood loss and  intraoperative fluids can be obtained from anesthesia records.  DISPOSITION:  The patient was extubated in the operating room and taken  to the recovery room in stable addition.  She was to go home the same  day and follow up in the office in less than a week.  I will contact her  by phone tonight.      Lubertha Basque Jerl Santos, M.D.  Electronically Signed     PGD/MEDQ  D:  04/29/2006  T:  04/29/2006  Job:  [id number]

## 2010-09-28 NOTE — Op Note (Signed)
NAME:  Weismann, Sayre                      ACCOUNT NO.:  [id number]   MEDICAL RECORD NO.:  [id number]                   PATIENT TYPE:  AMB   LOCATION:  DAY                                  [facility]AC[facility]   PHYSICIAN:  Thornton Park. Daphine Deutscher, M.D.             DATE OF BIRTH:  12/17/1955   DATE OF PROCEDURE:  01/27/2003  DATE OF DISCHARGE:                                 OPERATIVE REPORT   PREOPERATIVE DIAGNOSIS:  Acute cholecystitis.   POSTOPERATIVE DIAGNOSIS:  Acute on chronic cholecystitis with normal  intraoperative cholangiogram.   SURGEON:  Thornton Park. Daphine Deutscher, M.D.   ASSISTANT:  Currie Paris, M.D.   ANESTHESIA:  General endotracheal anesthesia.   DESCRIPTION OF PROCEDURE:  Mrs. Joubert is a 55 year old lady who was seen in  the office this morning with a three-week history of epigastric pain, waxing  and waning with associated nausea and vomiting after eating.  Recent  gallbladder had shown contracted gallbladder full of stones.  Informed  consent was obtained in the office and she was set up for surgery the same  day.   Mrs. Dorgan was taken to Room #10 on January 27, 2003 and given general  anesthesia.  She received 1 g of Ancef preoperatively.  The abdomen was  prepped with Betadine and draped sterilely.  Longitudinal incision was made  down into the umbilicus and I entered the fascia without difficulty.  Hasson  cannula was inserted and secured to the fascia with sutures of 0 Vicryl.  The abdomen was insufflated.  The right upper quadrant was visualized and  the gallbladder was basically completely walled off with adhesions to the  omentum.  I lifted up the gallbladder and then was able to strip these away  with blunt dissection.  Clips were used when needed and I used the  electrocautery sparingly and did stay away from the bowel with that.  The  gallbladder was then able to be elevated and then I used blunt  hydrodissection with the irrigator to strip away  some very dense stringy  adhesions that very much enshrouded this gallbladder and caused it to have  almost a bilobed appearance which can be seen in the chart with the  photographs.  I dissected down to what appeared to be a fairly distal  portion of the gallbladder/cystic duct and incised that.  I put a clip above  that and two clips on what I felt to be the cystic artery.  A Reddick  catheter was inserted into the cystic duct and the dynamic cholangiogram was  obtained.  Using the C-arm which showed filling of a fairly long cystic duct  with retrograde flow up into the intrahepatic radicals, both the right and  left side and then with a slow but significant trickle of contrast into the  duodenum.  No stones were seen.   I then triple clipped the cystic duct and divided it, clipped the cystic  artery again and divided it and then removed the gallbladder from the  gallbladder bed using electrocautery hook.  On the way up the bed,  I  controlled bleeding with the electrocautery and once detached, I placed the  gallbladder in a bag.  It was brought out through the umbilicus, went back  in and inspected the gallbladder bed and no bleeding or bowel leaks were  noted.  The area was irrigated and the irrigant was withdrawn.  The  umbilicus defect was repaired with a figure-of-eight suture of 0 Vicryl.  This was inspected with a laparoscope following this closure.  The abdomen  was then deflated.  The port sites were all  injected with 0.5% Marcaine. The wounds were closed with 4-0 Vicryl  subcutaneously and subcuticularly along with Benzoin and Steri-Strips.  The  patient seemed to tolerated the procedure well and was taken to the recovery  room in satisfactory condition.                                                Thornton Park Daphine Deutscher, M.D.    MBM/MEDQ  D:  01/27/2003  T:  01/28/2003  Job:  [id number]   cc:   Marne A. Milinda Antis, M.D. [facility]

## 2010-09-28 NOTE — Assessment & Plan Note (Signed)
[facility] OFFICE NOTE   NAME:Kristine [name], Kristine [name]                 MRN:          [id number]  DATE:06/26/2006                            DOB:          [birth year]    REFERRING PHYSICIAN:  Marne A. 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   HISTORY OF PRESENT ILLNESS:  Kristine [name] has a history of SVT and  palpitations about 20 years ago.  At that time she had an echocardiogram  which reportedly showed normal LV function.  She denies any history of  known coronary artery disease.  She did have a stress test in 2004.  At  that time, she was being worked up for her reflux disease.  This was  negative.  She has had a difficult 2 to 3 months.  In December she  underwent a shoulder arthroplasty for a large spur.  Several days later,  she fell and hurt her foot, and was in a walking boot.  Subsequent to  this, she developed left lower extremity leg swelling, which was  progressive.  On January 24, she had an ultrasound, which showed a DVT.  She was started on Coumadin.  She tells me that, over the past few  weeks, she has felt winded and tired.  She has also had some tightness  in her chest, which can come on at any time and is not related to  exertion.  She denies any orthopnea, PND.  She has not had any  hemoptysis.  No fevers or chills.  She is currently on disability from  her work.   REVIEW OF SYSTEMS:  Notable for gastroesophageal reflux disease,  anxiety, depression, fatigue, irritable bowel syndrome, and arthritis  pain.  The remainder of the review of systems is negative, except for  HPI and problem list.   PAST MEDICAL HISTORY:  1. Gastroesophageal reflux disease.  2. Remote history of SVT.  3. History of chest and abdominal discomfort status post negative      Myoview in 2004.  4. Borderline  hyperlipidemia.  5. History of preeclampsia.  6. Obesity.  7. Irritable bowel syndrome.   CURRENT MEDICATIONS:  1. Nexium 40 a day.  2. Coumadin.  3. Wellbutrin.   MEDICATION ALLERGIES:  PERCOCET, GYNE-LOTRIMIN.  Questionable allergy to  ZOLOFT or PAXIL.   SOCIAL HISTORY:  She is married with 1 child.  She is currently on  disability.  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  She denies any tobacco.  She does drink an occasional  alcoholic drink.   FAMILY HISTORY:  There is a strong family history of coronary artery  disease.  Mother is alive at 88 and well.  Father passed away form a  motor vehicle accident at 31 with a history of coronary artery disease  status post CABG.  One brother at the age of 41 has coronary artery  disease and bypass surgery x2.  She has another brother who is 28 and a  sister who is 51.   PHYSICAL EXAM:  She is well-appearing.  In no acute distress.  Ambulates  around the clinic without any respiratory difficulty.  Blood pressure is 140/84, heart rate 67, weight 196.  HEENT:  Sclerae anicteric, EOMI.  There is no xanthelasma.  Mucous  membranes are moist.  Oropharynx clear with good dentition.  NECK:  Supple.  No JVD.  Carotids are 2+ bilaterally without any bruits.  There is no lymphadenopathy or thyromegaly.  CARDIAC:  Regular rate and rhythm.  No murmurs, rubs, or gallops.  LUNGS:  Clear.  ABDOMEN:  Obese, nontender, nondistended.  No hepatosplenomegaly.  No  bruits.  No masses.  Good bowel sounds.  EXTREMITIES:  Warm with no cyanosis or clubbing.  There is some mild  edema on the left lower leg.  There are no cords.  No rashes.  NEURO:  She is alert and oriented x3.  Cranial nerves 2-12 are intact.  Moves all 4 extremities without difficulty.  Affect is very pleasant.   EKG:  Shows a sinus rhythm with a first degree AV block at 210 ms.  There is an incomplete right bundle branch block.  No significant ST-T  wave  changes.   ASSESSMENT AND PLAN:  Dyspnea and fatigue.  I suspect this may be  multifactorial and perhaps related to deconditioning after her multiple  medical problems over the past 2 months.  However, given her deep vein  thrombosis I think it is important to rule out possible pulmonary  embolus.  Also with her family history, I also feel it is reasonable to  proceed with stress testing and to check and echocardiogram.  We will  get these things done in the next week or 2 and follow up with her.  She  will come back to see me in 1 month.  We can see her sooner if her  symptoms are progressing.     Bevelyn Buckles. Bensimhon, MD  Electronically Signed    DRB/MedQ  DD: 06/26/2006  DT: 06/26/2006  Job #: [id number]   cc:   Marne A. Milinda Antis, MD

## 2010-09-28 NOTE — Discharge Summary (Signed)
NAME:  Kristine [name], Kristine [name]          ACCOUNT NO.:  [id number]   MEDICAL RECORD NO.:  [id number]          PATIENT TYPE:  INP   LOCATION:  5703                         FACILITY:  [facility]   PHYSICIAN:  Lubertha Basque. Dalldorf, M.D.DATE OF BIRTH:  [birth year]   DATE OF ADMISSION:  06/05/2006  DATE OF DISCHARGE:  06/07/2006                               DISCHARGE SUMMARY   ADMISSION DIAGNOSES:  1. Left lower extremity deep vein thrombosis.  2. History of reflux.   DISCHARGE DIAGNOSES:  1. Left lower extremity deep vein thrombosis.  2. History of reflux.   OPERATION/PROCEDURE:  None.   BRIEF HISTORY:  [name] is a 55 year old white female who has been  having pain and swelling in her left lower extremity secondary to kind  of a sprain strain from a fall.  She was seen in our office and was  having calf pain.  We sent her over to the hospital for a Doppler which  was positive for DVT.  We have discussed treatment options with her that  being admission to the hospital for anticoagulation.   PERTINENT LABORATORY DATA:  WBC 7.5, hemoglobin 12.1, platelets 214.  Serial INRs as she was Coumadin were done.  Sodium 139, potassium 3.7,  glucose 165, BUN 6, creatinine 0.71.   HOSPITAL COURSE:  She was admitted to Dr.Dalldorf's service, given pain  medication Vicodin, elevation to her left lower extremity, bedrest, and  had a pharmacy consult for DVT protocol treated.  Also kept on her home  medicines, Nexium 40 mg one a day, Xanax 0.25 q.8h p.r.n. anxiety, and  Tylenol for pain.  Diet regular.  She was started initially on Lovenox  and Coumadin until her INR was appropriate, and then the Lovenox was  discontinued.  She was given a Coumadin booklet for information  purposes.  Her course in the hospital, she was stable and comfortable  and was eating and voiding well and was elevating her leg until her  Coumadin levels could be regulated.  She did have a pastoral care  visitation.  Once her INR was  therapeutic, she was discharged.  She also  had a consultation with Dr. Eleonore Chiquito, who felt that it was safe  for her to be released on Coumadin and Lovenox protocols until her INR  was high enough just to be on Coumadin.   CONDITION ON DISCHARGE:  Improved.   FOLLOW UP:  She was given Lovenox injections dose regulated by a  pharmacy q.12h.  Coumadin to continue at 7.5 mg daily.  Okay to resume  home medications which I will outline in just a moment.  No aspirin or  other antiinflammatory drugs.  There will be a home agency or her family  physician to draw her INRs and regulate her Coumadin dose.  This will be  prearranged to her leaving the hospital.  Home medicines will remain:  Nexium 40 mg  a day, Allegra 180 mg a day, Zantac 75-150 a day, Xanax 0.25 as needed  for anxiety.  She will be on a heart-healthy diet or on restricted diet.  Elevate her leg.  Increase activities slowly.  Any sign of infection to  call our office [phone], and to follow up in 7-10 days with [place].      Lindwood Qua, P.A.      Lubertha Basque Jerl Santos, M.D.  Electronically Signed    MC/MEDQ  D:  08/03/2006  T:  08/04/2006  Job:  [id number]

## 2010-09-28 NOTE — Consult Note (Signed)
NAME:  Kristine [name], Kristine [name]          ACCOUNT NO.:  [id number]   MEDICAL RECORD NO.:  [id number]          PATIENT TYPE:  INP   LOCATION:  5703                         FACILITY:  [facility]   PHYSICIAN:  Gordy Savers, MDDATE OF BIRTH:  [birth year]   DATE OF CONSULTATION:  DATE OF DISCHARGE:  06/07/2006                                 CONSULTATION   CHIEF COMPLAINT:  Left leg pain.   HISTORY OF PRESENT ILLNESS:  The patient is a 55 year old white female  who was admitted to the orthopedic service two days ago for evaluation  and treatment of a left leg deep venous thrombosis.  The patient noted  some pain and swelling involving the left lower extremity after a recent  fall.  The patient was evaluated as an outpatient in the orthopedic  office where Doppler evaluation confirmed a deep venous thrombosis.  The  patient was subsequently admitted to the hospital for initiation of  Lovenox and Coumadin.  Laboratory studies have been unremarkable except  for a random blood sugar of 165.  The patient has been seen by the  pharmacy service and has been on a regimen of Lovenox 90 mg every 12  hours and Coumadin 7.5 mg daily.  The patient has noted improvement  involving the pain and swelling involving her left leg.  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  She does have coverage  for Lovenox injections via per prescription drug plan with [employer].   RECOMMENDATIONS:  Had discussed options with the patient, she is very  anxious and agreeable for home discharge and self injections.  She will  be discharged on the Lovenox 90 mg twice a day along with Coumadin 7.5  mg daily.  She will follow up with her primary care Benjamin Casanas Dr. Milinda Antis  in 48 hours.  A CBC with platelet count, PT/INR will be checked at that  time.  She has been asked to keep the left leg elevated as much as  possible and report any worsening leg pain, swelling, chest pain or  shortness of breath.      Gordy Savers, MD  Electronically Signed     PFK/MEDQ  D:  06/07/2006  T:  06/08/2006  Job:  [id number]

## 2010-09-28 NOTE — Assessment & Plan Note (Signed)
[facility] OFFICE NOTE   NAME:Kristine [name], Kristine [name]                 MRN:          [id number]  DATE:07/16/2006                            DOB:          1955-12-21    PRIMARY CARE PHYSICIAN:  Dr. Roxy Manns.   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 We last saw her about two  weeks ago at which time she was complaining of significant dyspnea. We  had some concern over a possible pulmonary embolus or underlying  coronary disease. She underwent a chest CT which showed no evidence of  PE or dissection. She also had an echocardiogram which showed an EF 65  to 70% with mild MR and mild PR. The RV was normal. There was no  evidence of increased pulmonary pressures. She also underwent a Myoview  which showed an EF of 70% and no evidence of ischemia or infarct.   She returns today. She says her breathing is continuing to get better,  although she is short of breath with some activities. She denies any  chest pain, mild lower extremity edema. No orthopnea or PND.   CURRENT MEDICATIONS:  1. Nexium 40 a day.  2. Coumadin.  3. Wellbutrin.   PHYSICAL EXAMINATION:  GENERAL:  She is well-appearing in no acute  distress. Ambulates around the clinic without any respiratory  difficulty.  VITAL SIGNS:  Blood pressure 130/84 with heart rate 65.  HEENT:  Sclerae anicteric. EOMI. There is no xanthelasmas. Mixed  membranes moist. Oropharynx clear.  NECK:  Supple. No JVD. Carotids are 2+ bilaterally without bruits. There  is no lymphadenopathy or thyromegaly.  CARDIAC:  Regular rate and rhythm. No murmurs, rubs or gallops.  LUNGS:  Clear.  ABDOMEN:  Obese, nontender, nondistended. No hepatosplenomegaly, no  bruits, no masses appreciated. Good bowel sounds.  EXTREMITIES:  Warm with no  cyanosis, clubbing. There is trace edema. No  rash.  NEUROLOGICAL:  Alert and oriented x3. Cranial nerves II-XII are grossly  intact. Moves all four extremities without difficulty.   ASSESSMENT/PLAN:  1. Dyspnea. This is much improved. I suspect it may be related to her      deconditioning after her leg injury. She continues to get stronger.      I suspect she may also have a component of diastolic dysfunction,      although this is not reported on her echocardiogram. Will continue      with just watchful care.  2. Hypertension. Blood pressure is just minimally elevated. I asked      her to follow up with her primary care physician.  3. Deep venous thrombosis. Continue Coumadin as per her primary care      physician. Given the fact that there was not an inciting event, six      months of anticoagulation is probably sufficient.   DISPOSITION:  She will return on as needed basis.     Bevelyn Buckles. Bensimhon, MD  Electronically  Signed    DRB/MedQ  DD: 07/16/2006  DT: 07/16/2006  Job #: [id number]   cc:   Marne A. Milinda Antis, MD

## 2010-10-09 ENCOUNTER — Telehealth: Payer: Self-pay | Admitting: *Deleted

## 2010-10-09 NOTE — Telephone Encounter (Signed)
Triage Record Num: [id number] Operator: Audelia Hives Patient Name: Kristine [name] Call Date & Time: 10/06/2010 9:59:50AM Patient Phone: [phone] PCP: Audrie Gallus. Tower Patient Gender: Female PCP Fax : Patient DOB: Jul 03, 1955 Practice Name: [facility] Reason for Call: [facility] calling regarding possible sinus infection and bronchitis. Onset 09/27/10. Cough with green phlegm. Afebrile. Emergent s/s for Upper Respiratory Infection r/o per protocol except for see in 24 hours due to productice cough. [place] to [place]. Took Motrin 800mg  with relief of headache. Protocol(s) Used: Upper Respiratory Infection (URI) Recommended Outcome per Protocol: See Provider within 24 hours Reason for Outcome: Productive cough with colored sputum (other than clear or white sputum) Care Advice: ~ Use a cool mist humidifier to moisten air. Be sure to clean according to manufacturer's instructions. Increase fluids to 8-12 eight oz (1.6 to 2.4 liters) glasses per day, half of them to be water. Soups, popsicles, fruit juices, non-caffeinated sodas (unless restricting sodium intake), jello, broths, decaf teas, etc. are all okay. Warm fluids can be soothing. ~ ~ Warm fluids may help, or try a mixture of honey and lemon juice in warm tea. ~ SYMPTOM / CONDITION MANAGEMENT Coughing up mucus or phlegm helps to get rid of an infection. A productive cough should not be stopped. A cough medicine with guaifenesin (Robitussin, Mucinex) can help loosen the mucus. Cough medicine with dextromethorphan (DM) should be avoided. Drinking lots of fluids can help loosen the mucus too, especially warm fluids. ~ Analgesic/Antipyretic Advice - NSAIDs: Consider aspirin, ibuprofen, naproxen or ketoprofen for pain or fever as directed on label or by pharmacist/provider. PRECAUTIONS: - If over 58 years of age, should not take longer than 1 week without consulting provider. EXCEPTIONS: -  Should not be used if taking blood thinners or have bleeding problems. - Do not use if have history of sensitivity/allergy to any of these medications; or history of cardiovascular, ulcer, kidney, liver disease or diabetes unless approved by provider. - Do not exceed recommended dose or frequency. ~ 10/06/2010 10:11:58AM Page 1 of 1 [id number]

## 2010-11-24 ENCOUNTER — Other Ambulatory Visit: Payer: Self-pay | Admitting: Family Medicine

## 2010-11-26 ENCOUNTER — Other Ambulatory Visit: Payer: Self-pay | Admitting: *Deleted

## 2010-11-26 MED ORDER — SIMVASTATIN 20 MG PO TABS: ORAL_TABLET | ORAL | Status: DC

## 2010-12-18 ENCOUNTER — Encounter: Payer: Self-pay | Admitting: Family Medicine

## 2010-12-18 ENCOUNTER — Ambulatory Visit (INDEPENDENT_AMBULATORY_CARE_PROVIDER_SITE_OTHER): Payer: BC Managed Care – PPO | Admitting: Family Medicine

## 2010-12-18 DIAGNOSIS — R059 Cough, unspecified: Secondary | ICD-10-CM

## 2010-12-18 DIAGNOSIS — R05 Cough: Secondary | ICD-10-CM

## 2010-12-18 MED ORDER — AZITHROMYCIN 250 MG PO TABS: ORAL_TABLET | ORAL | Status: AC

## 2010-12-18 NOTE — Patient Instructions (Signed)
Drink plenty of fluids, take tylenol as needed, and start the zithromax today.  Stop the septra.  This should gradually improve.  Take care.  Let us know if you have other concerns.

## 2010-12-18 NOTE — Progress Notes (Signed)
3 weeks ago, sinus pressure and postnasal gtt. 2 weeks of cough.  10 days of productive sputum.  Chest tightness.  Minimally SOB with exertion.  Aching.  Pred had possible CAP [place]  Nonsmoker.  No fevers.  No wheeze.  On septra since Sunday w/o improvement.  Fatigued.    Meds, vitals, and allergies reviewed.   ROS: See HPI.  Otherwise, noncontributory.  GEN: nad, alert and oriented HEENT: mucous membranes moist, tm w/o erythema, nasal exam w/o erythema, clear discharge noted,  OP with cobblestoning, R max sinus ttp NECK: supple w/o LA CV: rrr.   PULM: ctab, no inc wob, no wheeze EXT: no edema SKIN: no acute rash

## 2010-12-19 DIAGNOSIS — R059 Cough, unspecified: Secondary | ICD-10-CM | POA: Insufficient documentation

## 2010-12-19 DIAGNOSIS — R05 Cough: Secondary | ICD-10-CM | POA: Insufficient documentation

## 2010-12-19 NOTE — Assessment & Plan Note (Signed)
Given her history, the tender max sinus, and the recent duration, I would treat and cover for atypicals.  She is nontoxic.  Zmax, supportive tx and f/u prn.  She agrees.  Okay for outpatient f/u.

## 2011-03-21 ENCOUNTER — Telehealth: Payer: Self-pay | Admitting: *Deleted

## 2011-03-21 ENCOUNTER — Ambulatory Visit (INDEPENDENT_AMBULATORY_CARE_PROVIDER_SITE_OTHER): Payer: BC Managed Care – PPO

## 2011-03-21 DIAGNOSIS — Z23 Encounter for immunization: Secondary | ICD-10-CM

## 2011-03-21 MED ORDER — ALPRAZOLAM 0.5 MG PO TABS: 0.5000 mg | ORAL_TABLET | Freq: Every evening | ORAL | Status: AC | PRN

## 2011-03-21 NOTE — Telephone Encounter (Signed)
Patient notified as instructed by telephone. Patient declined appointment at this time stating that her mom is in the late stage of dying and she does not want to leave her. Patient stated that her mom had a stroke and is at [facility] with Hospice care. Patient stated that she is not asking for something long term to take, but would like something to help with anxiety during this time while she is dealing with her mother dying.

## 2011-03-21 NOTE — Telephone Encounter (Signed)
We have to discuss that before I can px - please make appt --to disc poss choices/ side effects/ whether she needs something for anxiety or not In meantime benadryl 25-50 mg at bedtime or melatonin or volarian are options

## 2011-03-21 NOTE — Telephone Encounter (Signed)
Patient was at the office for a flu shot. Patient stated that her mother is dying and that she is not sleeping and her nerves are bad. Patient requested that something be called in for her to help her during this time. Pharmacy [facility]

## 2011-03-21 NOTE — Telephone Encounter (Signed)
Can try some xanax with caution- no driving with this

## 2011-03-21 NOTE — Telephone Encounter (Signed)
Patient notified as instructed by telephone. Rx called to pharmacy. 

## 2011-05-13 ENCOUNTER — Telehealth: Payer: Self-pay | Admitting: Internal Medicine

## 2011-05-13 ENCOUNTER — Encounter: Payer: Self-pay | Admitting: Internal Medicine

## 2011-05-13 NOTE — Telephone Encounter (Signed)
Spoke with patient and told her the recommendation was repeat in 7 years if brother's polyps were pre cancerous. She will get a copy of his path report for Dr. Leone Payor to review to be sure what she should do. She wants to keep the appointment for colonoscopy at present.

## 2011-05-23 ENCOUNTER — Telehealth: Payer: Self-pay | Admitting: Internal Medicine

## 2011-05-23 NOTE — Telephone Encounter (Signed)
Patient calling to report that her brothers polyp was tubular adenoma. She will come for her colonoscopy tomorrow.

## 2011-05-24 ENCOUNTER — Ambulatory Visit (AMBULATORY_SURGERY_CENTER): Payer: BC Managed Care – PPO

## 2011-05-24 ENCOUNTER — Other Ambulatory Visit: Payer: Self-pay | Admitting: Family Medicine

## 2011-05-24 VITALS — Ht 66.5 in | Wt 197.0 lb

## 2011-05-24 DIAGNOSIS — Z1211 Encounter for screening for malignant neoplasm of colon: Secondary | ICD-10-CM

## 2011-05-24 MED ORDER — PEG-KCL-NACL-NASULF-NA ASC-C 100 G PO SOLR: 1.0000 | Freq: Once | ORAL | Status: DC

## 2011-06-07 ENCOUNTER — Ambulatory Visit (AMBULATORY_SURGERY_CENTER): Payer: BC Managed Care – PPO | Admitting: Internal Medicine

## 2011-06-07 ENCOUNTER — Encounter: Payer: Self-pay | Admitting: Internal Medicine

## 2011-06-07 ENCOUNTER — Other Ambulatory Visit: Payer: BC Managed Care – PPO | Admitting: Internal Medicine

## 2011-06-07 VITALS — BP 152/83 | HR 83 | Temp 97.8°F | Resp 24 | Ht 67.2 in | Wt 197.0 lb

## 2011-06-07 DIAGNOSIS — K648 Other hemorrhoids: Secondary | ICD-10-CM

## 2011-06-07 DIAGNOSIS — Z1211 Encounter for screening for malignant neoplasm of colon: Secondary | ICD-10-CM

## 2011-06-07 MED ORDER — SODIUM CHLORIDE 0.9 % IV SOLN: 500.0000 mL | INTRAVENOUS | Status: DC

## 2011-06-07 NOTE — Progress Notes (Signed)
Patient did not experience any of the following events: a burn prior to discharge; a fall within the facility; wrong site/side/patient/procedure/implant event; or a hospital transfer or hospital admission upon discharge from the facility. (G8907) Patient did not have preoperative order for IV antibiotic SSI prophylaxis. (G8918)  

## 2011-06-07 NOTE — Op Note (Signed)
[facility] [address] [place], [place]  [address]  COLONOSCOPY PROCEDURE REPORT  PATIENT:  Kristine [name], Kristine [name]  MR#:  [id number] BIRTHDATE:  1955/08/08, 55 yrs. old  GENDER:  female ENDOSCOPIST:  Iva Boop, MD, [facility]  PROCEDURE DATE:  06/07/2011 PROCEDURE:  Colonoscopy 19147 ASA CLASS:  Class I INDICATIONS:  Routine Risk Screening MEDICATIONS:   These medications were titrated to patient response per physician's verbal order, Fentanyl 75 mcg IV, Versed 8 mg IV  DESCRIPTION OF PROCEDURE:   After the risks benefits and alternatives of the procedure were thoroughly explained, informed consent was obtained.  Digital rectal exam was performed and revealed no abnormalities.   The [id number] endoscope was introduced through the anus and advanced to the cecum, which was identified by both the appendix and ileocecal valve, without limitations.  The quality of the prep was excellent, using MoviPrep.  The instrument was then slowly withdrawn as the colon was fully examined. <<PROCEDUREIMAGES>>  FINDINGS:  A normal appearing cecum, ileocecal valve, and appendiceal orifice were identified. The ascending, hepatic flexure, transverse, splenic flexure, descending, sigmoid colon, and rectum appeared unremarkable.   Retroflexed views in the rectum revealed internal hemorrhoids.  Small.  The time to cecum = 2:17 minutes. The scope was then withdrawn in 10:04 minutes from the cecum and the procedure completed. COMPLICATIONS:  None ENDOSCOPIC IMPRESSION: 1) Normal colon, excellent prep 2) Internal hemorrhoids  REPEAT EXAM:  In 10 year(s) for routine screening colonoscopy.  Iva Boop, MD, [facility]  CC:  The Patient  n. eSIGNED:   Iva Boop at 06/07/2011 02:47 PM  Gaspar Garbe, [phone]

## 2011-06-07 NOTE — Patient Instructions (Addendum)
Very small hemorrhoids (internal) but otherwise normal colonoscopy! Next routine colonoscopy in 2023! Iva Boop, MD, [facility] Please refer to blue and green discharge instruction sheets.

## 2011-06-10 ENCOUNTER — Telehealth: Payer: Self-pay | Admitting: *Deleted

## 2011-06-10 NOTE — Telephone Encounter (Signed)
NO ANSWER, MESSAGE LEFT FOR THE PATIENT. 

## 2011-06-14 ENCOUNTER — Ambulatory Visit (INDEPENDENT_AMBULATORY_CARE_PROVIDER_SITE_OTHER): Payer: BC Managed Care – PPO | Admitting: Family Medicine

## 2011-06-14 ENCOUNTER — Encounter: Payer: Self-pay | Admitting: Family Medicine

## 2011-06-14 ENCOUNTER — Ambulatory Visit: Payer: BC Managed Care – PPO | Admitting: Family Medicine

## 2011-06-14 VITALS — BP 138/80 | HR 69 | Temp 97.7°F

## 2011-06-14 DIAGNOSIS — E669 Obesity, unspecified: Secondary | ICD-10-CM

## 2011-06-14 DIAGNOSIS — F329 Major depressive disorder, single episode, unspecified: Secondary | ICD-10-CM

## 2011-06-14 DIAGNOSIS — Z0000 Encounter for general adult medical examination without abnormal findings: Secondary | ICD-10-CM

## 2011-06-14 DIAGNOSIS — J310 Chronic rhinitis: Secondary | ICD-10-CM

## 2011-06-14 DIAGNOSIS — E7800 Pure hypercholesterolemia, unspecified: Secondary | ICD-10-CM

## 2011-06-14 DIAGNOSIS — F3289 Other specified depressive episodes: Secondary | ICD-10-CM

## 2011-06-14 DIAGNOSIS — K219 Gastro-esophageal reflux disease without esophagitis: Secondary | ICD-10-CM

## 2011-06-14 MED ORDER — BUPROPION HCL ER (SR) 150 MG PO TB12: 150.0000 mg | ORAL_TABLET | Freq: Every day | ORAL | Status: DC

## 2011-06-14 MED ORDER — SIMVASTATIN 20 MG PO TABS: 20.0000 mg | ORAL_TABLET | Freq: Every day | ORAL | Status: DC

## 2011-06-14 MED ORDER — FLUTICASONE PROPIONATE 50 MCG/ACT NA SUSP: 2.0000 | Freq: Every day | NASAL | Status: DC

## 2011-06-14 NOTE — Assessment & Plan Note (Signed)
For occ breakthrough - she has zantac and nexuim Not using regularly  Overall doing well

## 2011-06-14 NOTE — Patient Instructions (Addendum)
Schedule fasting labs when convenient  Try your hardest to work on healthy diet and exercise Avoid red meat/ fried foods/ egg yolks/ fatty breakfast meats/ butter, cheese and high fat dairy/ and shellfish

## 2011-06-14 NOTE — Progress Notes (Signed)
Subjective:    Patient ID: Kristine [name], female    DOB: 1955-10-01, 56 y.o.   MRN: [id number]  HPI Here for f/u of chronic conditions incl lipids and gerd Has been doing fairly well overall   Had neg mammo and neg colonosc , pap will be done next week   Lab Results  Component Value Date   CHOL 190 10/29/2006   HDL 57.0 10/29/2006   LDLCALC 120* 10/29/2006   LDLDIRECT 146.1 08/19/2006   TRIG 64 10/29/2006   CHOLHDL 3.3 CALC 10/29/2006    Terrible with diet and exercise because her mom was so sick  Lots of stress at work  Difficult to get time for herself   Going to a massage today  Is working 11-12 hour days   Is eating too much conv foods   GERd is ok - when she eats app  occ breakthrough with stress  Uses nexium only when she needs it   Patient Active Problem List  Diagnoses  . NEVUS, ATYPICAL  . HYPERCHOLESTEROLEMIA, PURE  . CONGENITAL DEFICIENCY OF OTHER CLOTTING FACTORS  . ANXIETY  . DEPRESSION  . PAT  . ESOPHAGITIS  . GASTROESOPHAGEAL REFLUX DISEASE  . GASTRITIS  . IBS  . SCIATICA  . UNSPECIFIED SYNOVITIS AND TENOSYNOVITIS  . LIVER FUNCTION TESTS, ABNORMAL  . DVT, HX OF  . MIGRAINES, HX OF  . FRACTURE, RADIUS  . Cough  . Rhinitis  . Routine general medical examination at a health care facility   Past Medical History  Diagnosis Date  . DVT (deep venous thrombosis) 1/08  . Anxiety   . Depression   . Factor V Leiden     On coag x 1 year ending in 09  . Basal cell carcinoma     face  . Basal cell cancer   . Radial head fracture 2011  . Gastritis 1990    hospital- abd pain  . Esophageal erosions   . GERD (gastroesophageal reflux disease)    Past Surgical History  Procedure Date  . Cholecystectomy   . Oophorectomy   . Shoulder surgery 04/2006  . Mohs surgery 7/11    basal cell carcinoma near eye  . Tonsillectomy 1976  . Colonoscopy   . Upper gastrointestinal endoscopy    History  Substance Use Topics  . Smoking status: Never Smoker     . Smokeless tobacco: Never Used  . Alcohol Use: Yes   Family History  Problem Relation Age of Onset  . Heart attack Father     x 3   . Graves' disease Mother   . Depression Mother   . Osteoporosis Mother   . Breast cancer Sister   . Heart attack Brother     x 2  . Diabetes Brother   . Hypertension Brother   . Hypertension Sister   . Coronary artery disease Brother     with stents   . Atrial fibrillation Brother   . Colon cancer Neg Hx    Allergies  Allergen Reactions  . Calcium     REACTION: muscle aches  . Erythromycin     REACTION: GI  . Guaifenesin     REACTION: GI  . HYQ:MVHQIONGEXB+MWUXLKGMW+NUUVOZDGUY Acid+Aspartame     REACTION: GI  . Naproxen     REACTION: GI  . Oxycodone-Acetaminophen     REACTION: reaction not known  . Oxycodone-Aspirin     REACTION: reaction not known  . Sertraline Hcl     REACTION: hives   Current  Outpatient Prescriptions on File Prior to Visit  Medication Sig Dispense Refill  . acetaminophen (TYLENOL) 500 MG tablet Take 500 mg by mouth every 6 (six) hours as needed.        . ALPRAZolam (XANAX) 0.25 MG tablet Take 0.25 mg by mouth daily.        Marland Kitchen ALPRAZolam (XANAX) 0.5 MG tablet Take 1 tablet (0.5 mg total) by mouth at bedtime as needed for sleep or anxiety.  20 tablet  0  . aspirin 81 MG tablet Take 81 mg by mouth daily.        Marland Kitchen esomeprazole (NEXIUM) 40 MG packet Take 40 mg by mouth as needed.        . fexofenadine (ALLEGRA) 180 MG tablet Take 180 mg by mouth daily.        . ranitidine (ZANTAC) 150 MG tablet Take 150 mg by mouth 2 (two) times daily.           Review of Systems Review of Systems  Constitutional: Negative for fever, appetite change,and unexpected weight change. pos for fatigue  Eyes: Negative for pain and visual disturbance.  Respiratory: Negative for cough and shortness of breath.   Cardiovascular: Negative for cp or palpitations    Gastrointestinal: Negative for nausea, diarrhea and constipation.   Genitourinary: Negative for urgency and frequency.  Skin: Negative for pallor or rash   Neurological: Negative for weakness, light-headedness, numbness and headaches.  Hematological: Negative for adenopathy. Does not bruise/bleed easily.  Psychiatric/Behavioral: Negative for dysphoric mood. The patient is not nervous/anxious.  pos for symptoms of grief reaction        Objective:   Physical Exam  Constitutional: She appears well-developed and well-nourished. No distress.       overwt and well appearing   HENT:  Head: Normocephalic and atraumatic.  Right Ear: External ear normal.  Left Ear: External ear normal.  Nose: Nose normal.  Mouth/Throat: Oropharynx is clear and moist.  Eyes: Conjunctivae and EOM are normal. Pupils are equal, round, and reactive to light. No scleral icterus.  Neck: Normal range of motion. Neck supple. No JVD present. Carotid bruit is not present. No thyromegaly present.  Cardiovascular: Normal rate, regular rhythm, normal heart sounds and intact distal pulses.  Exam reveals no gallop.   Pulmonary/Chest: Effort normal and breath sounds normal. No respiratory distress. She has no wheezes.  Abdominal: Soft. Bowel sounds are normal. She exhibits no distension, no abdominal bruit and no mass. There is no tenderness.  Musculoskeletal: Normal range of motion. She exhibits no edema and no tenderness.  Lymphadenopathy:    She has no cervical adenopathy.  Neurological: She is alert. She has normal reflexes. No cranial nerve deficit. She exhibits normal muscle tone. Coordination normal.  Skin: Skin is warm and dry. No rash noted. No erythema. No pallor.  Psychiatric: She has a normal mood and affect. Her behavior is normal.       Is generally fatigued but in good spirits           Assessment & Plan:

## 2011-06-14 NOTE — Assessment & Plan Note (Signed)
Worse cong lately  Will start flonase and update

## 2011-06-14 NOTE — Assessment & Plan Note (Signed)
Doing well with simvastatin but diet is poor due to stress Disc goals for lipids and reasons to control them Rev labs with pt- previous  Rev low sat fat diet in detail  sched fasting lab Has PE in june

## 2011-06-14 NOTE — Assessment & Plan Note (Signed)
Doing fairly well with loss of mother now - will continue low dose wellbutrin  Uses xanax very sparingly

## 2011-06-16 DIAGNOSIS — E669 Obesity, unspecified: Secondary | ICD-10-CM | POA: Insufficient documentation

## 2011-06-16 NOTE — Assessment & Plan Note (Signed)
Discussed how this problem influences overall health and the risks it imposes  Reviewed plan for weight loss with lower calorie diet (via better food choices and also portion control or program like weight watchers) and exercise building up to or more than 30 minutes 5 days per week including some aerobic activity    

## 2011-06-24 ENCOUNTER — Ambulatory Visit (INDEPENDENT_AMBULATORY_CARE_PROVIDER_SITE_OTHER): Payer: BC Managed Care – PPO | Admitting: Family Medicine

## 2011-06-24 ENCOUNTER — Encounter: Payer: Self-pay | Admitting: Family Medicine

## 2011-06-24 DIAGNOSIS — R05 Cough: Secondary | ICD-10-CM

## 2011-06-24 DIAGNOSIS — R059 Cough, unspecified: Secondary | ICD-10-CM

## 2011-06-24 MED ORDER — BENZONATATE 200 MG PO CAPS: 200.0000 mg | ORAL_CAPSULE | Freq: Three times a day (TID) | ORAL | Status: AC | PRN

## 2011-06-24 MED ORDER — AZITHROMYCIN 250 MG PO TABS: ORAL_TABLET | ORAL | Status: AC

## 2011-06-24 NOTE — Progress Notes (Signed)
Started with head congestion about 3 weeks ago.  Was using nasal saline, never started flonase. Last weekend she had more cold sx, then over last week inc in cough, fatigue, inc in cough at night.  Fever over the weekend.  +sputum, now discolored.  Chest feels tight. HA continues.  Fever up to 100.4 last night.  Mult sick contacts.  Meds, vitals, and allergies reviewed.   ROS: See HPI.  Otherwise, noncontributory.  GEN: nad, alert and oriented HEENT: mucous membranes moist, tm w/o erythema, nasal exam w/o erythema, clear discharge noted,  OP with cobblestoning NECK: supple w/o LA CV: rrr.   PULM: ctab, mildly coarse bs, no inc wob EXT: no edema SKIN: no acute rash

## 2011-06-24 NOTE — Patient Instructions (Signed)
Drink plenty of fluids, take tylenol as needed, and start the antibiotics and tessalon today.  This should gradually improve.  Take care.  Let us know if you have other concerns.

## 2011-06-26 NOTE — Assessment & Plan Note (Signed)
With fever, given duration would cover for atypicals. Start zmax and f/u prn.  Nontoxic.  She agrees. Supportive tx o/w.

## 2011-09-03 ENCOUNTER — Other Ambulatory Visit: Payer: Self-pay | Admitting: *Deleted

## 2011-09-03 MED ORDER — ESOMEPRAZOLE MAGNESIUM 40 MG PO PACK: 40.0000 mg | PACK | ORAL | Status: DC | PRN

## 2011-09-06 ENCOUNTER — Telehealth: Payer: Self-pay

## 2011-09-06 NOTE — Telephone Encounter (Signed)
Prior authorization request from [facility]. Received faxed form from [facility] pharmacy requesting PA for Nexium. Dr Milinda Antis has completed form and Lyla Son faxed to [phone]. Artelia Laroche has PA at desk waiting for approval letter.

## 2011-09-10 NOTE — Telephone Encounter (Signed)
Letter of approval received and faxed to [facility] [phone]. PA form and approval letter put Dr Royden Purl shelf in the in box for signature and then to be scanned.

## 2011-10-15 ENCOUNTER — Other Ambulatory Visit (INDEPENDENT_AMBULATORY_CARE_PROVIDER_SITE_OTHER): Payer: BC Managed Care – PPO

## 2011-10-15 DIAGNOSIS — E7800 Pure hypercholesterolemia, unspecified: Secondary | ICD-10-CM

## 2011-10-15 DIAGNOSIS — Z0000 Encounter for general adult medical examination without abnormal findings: Secondary | ICD-10-CM

## 2011-10-15 DIAGNOSIS — K219 Gastro-esophageal reflux disease without esophagitis: Secondary | ICD-10-CM

## 2011-10-15 LAB — TSH: TSH: 2.33 u[IU]/mL (ref 0.35–5.50)

## 2011-10-15 LAB — COMPREHENSIVE METABOLIC PANEL
ALT: 19 U/L (ref 0–35)
Alkaline Phosphatase: 61 U/L (ref 39–117)
CO2: 28 mEq/L (ref 19–32)
Potassium: 4.4 mEq/L (ref 3.5–5.1)
Sodium: 145 mEq/L (ref 135–145)
Total Bilirubin: 0.5 mg/dL (ref 0.3–1.2)
Total Protein: 6.8 g/dL (ref 6.0–8.3)

## 2011-10-15 LAB — CBC WITH DIFFERENTIAL/PLATELET
Basophils Absolute: 0.1 10*3/uL (ref 0.0–0.1)
HCT: 40 % (ref 36.0–46.0)
Lymphs Abs: 1.5 10*3/uL (ref 0.7–4.0)
Monocytes Absolute: 0.4 10*3/uL (ref 0.1–1.0)
Monocytes Relative: 7.8 % (ref 3.0–12.0)
Neutrophils Relative %: 55.9 % (ref 43.0–77.0)
Platelets: 155 10*3/uL (ref 150.0–400.0)
RDW: 13.2 % (ref 11.5–14.6)

## 2011-10-15 LAB — LIPID PANEL
LDL Cholesterol: 60 mg/dL (ref 0–99)
VLDL: 13 mg/dL (ref 0.0–40.0)

## 2011-10-18 ENCOUNTER — Encounter: Payer: Self-pay | Admitting: Family Medicine

## 2011-10-18 ENCOUNTER — Ambulatory Visit (INDEPENDENT_AMBULATORY_CARE_PROVIDER_SITE_OTHER): Payer: BC Managed Care – PPO | Admitting: Family Medicine

## 2011-10-18 VITALS — BP 130/84 | HR 71 | Temp 97.8°F | Ht 65.25 in | Wt 198.2 lb

## 2011-10-18 DIAGNOSIS — Z0000 Encounter for general adult medical examination without abnormal findings: Secondary | ICD-10-CM

## 2011-10-18 DIAGNOSIS — E7800 Pure hypercholesterolemia, unspecified: Secondary | ICD-10-CM

## 2011-10-18 NOTE — Patient Instructions (Signed)
Good job with your lifestyle change so far --keep up the good work  Cholesterol is looking good No change in medicines  Keep walking !

## 2011-10-18 NOTE — Progress Notes (Signed)
Subjective:    Patient ID: Kristine [name], female    DOB: 1956-04-20, 56 y.o.   MRN: [id number]  HPI Here for health maintenance exam and to review chronic medical problems   Is doing ok overall    Wt is up 1 lb with bmi of 32  Lost 10 lb and then gained it back on vacation - [place] and the beach  Is having more swelling too in both feet and legs (they feel tight )-- more sodium on vacation  Is back walking regularly - aims for 3 or more days per week    Labs are overall ok    Chemistry      Component Value Date/Time   NA 145 10/15/2011 0838   K 4.4 10/15/2011 0838   CL 111 10/15/2011 0838   CO2 28 10/15/2011 0838   BUN 15 10/15/2011 0838   CREATININE 0.6 10/15/2011 0838      Component Value Date/Time   CALCIUM 9.0 10/15/2011 0838   ALKPHOS 61 10/15/2011 0838   AST 16 10/15/2011 0838   ALT 19 10/15/2011 0838   BILITOT 0.5 10/15/2011 0838     Lab Results  Component Value Date   WBC 4.9 10/15/2011   HGB 13.1 10/15/2011   HCT 40.0 10/15/2011   MCV 87.4 10/15/2011   PLT 155.0 10/15/2011    Lab Results  Component Value Date   TSH 2.33 10/15/2011     Hyperlipidemia on zocor-- missed a few doses - but overall doing well  Diet is better and better , also walking  Lab Results  Component Value Date   CHOL 135 10/15/2011   CHOL 190 10/29/2006   CHOL 209* 08/19/2006   Lab Results  Component Value Date   HDL 62.30 10/15/2011   HDL 57.0 10/29/2006   HDL 51.7 08/19/2006   Lab Results  Component Value Date   LDLCALC 60 10/15/2011   LDLCALC 120* 10/29/2006   Lab Results  Component Value Date   TRIG 65.0 10/15/2011   TRIG 64 10/29/2006   TRIG 118 08/19/2006   Lab Results  Component Value Date   CHOLHDL 2 10/15/2011   CHOLHDL 3.3 CALC 10/29/2006   CHOLHDL 4.0 CALC 08/19/2006   Lab Results  Component Value Date   LDLDIRECT 146.1 08/19/2006     Gyn care- had her pap and exam with gyn in jan 2013  mammo 1/13 fine  Self exam-- no lumps or changes   colonosc 1/13  utd imms   Patient Active Problem List    Diagnoses  . NEVUS, ATYPICAL  . HYPERCHOLESTEROLEMIA, PURE  . CONGENITAL DEFICIENCY OF OTHER CLOTTING FACTORS  . ANXIETY  . DEPRESSION  . PAT  . ESOPHAGITIS  . GASTROESOPHAGEAL REFLUX DISEASE  . GASTRITIS  . IBS  . SCIATICA  . UNSPECIFIED SYNOVITIS AND TENOSYNOVITIS  . LIVER FUNCTION TESTS, ABNORMAL  . DVT, HX OF  . MIGRAINES, HX OF  . FRACTURE, RADIUS  . Cough  . Rhinitis  . Routine general medical examination at a health care facility  . Obesity   Past Medical History  Diagnosis Date  . DVT (deep venous thrombosis) 1/08  . Anxiety   . Depression   . Factor V Leiden     On coag x 1 year ending in 09  . Basal cell carcinoma     face  . Basal cell cancer   . Radial head fracture 2011  . Gastritis 1990    hospital- abd pain  . Esophageal erosions   .  GERD (gastroesophageal reflux disease)    Past Surgical History  Procedure Date  . Cholecystectomy   . Oophorectomy   . Shoulder surgery 04/2006  . Mohs surgery 7/11    basal cell carcinoma near eye  . Tonsillectomy 1976  . Colonoscopy   . Upper gastrointestinal endoscopy    History  Substance Use Topics  . Smoking status: Never Smoker   . Smokeless tobacco: Never Used  . Alcohol Use: Yes   Family History  Problem Relation Age of Onset  . Heart attack Father     x 3   . Graves' disease Mother   . Depression Mother   . Osteoporosis Mother   . Breast cancer Sister   . Heart attack Brother     x 2  . Diabetes Brother   . Hypertension Brother   . Hypertension Sister   . Coronary artery disease Brother     with stents   . Atrial fibrillation Brother   . Colon cancer Neg Hx    Allergies  Allergen Reactions  . Amoxicillin-Pot Clavulanate     REACTION: GI  . Calcium     REACTION: muscle aches  . Erythromycin     REACTION: GI  . Guaifenesin     REACTION: GI  . Naproxen     REACTION: GI  . Oxycodone-Acetaminophen     REACTION: reaction not known  . Oxycodone-Aspirin     REACTION: reaction not  known  . Sertraline Hcl     REACTION: hives   Current Outpatient Prescriptions on File Prior to Visit  Medication Sig Dispense Refill  . acetaminophen (TYLENOL) 500 MG tablet Take 500 mg by mouth every 6 (six) hours as needed.        . ALPRAZolam (XANAX) 0.25 MG tablet Take 0.25 mg by mouth daily as needed.       Marland Kitchen aspirin 81 MG tablet Take 81 mg by mouth daily.        Marland Kitchen buPROPion (WELLBUTRIN SR) 150 MG 12 hr tablet Take 1 tablet (150 mg total) by mouth daily.  90 tablet  3  . esomeprazole (NEXIUM) 40 MG packet Take 40 mg by mouth as needed.  30 each  6  . fexofenadine (ALLEGRA) 180 MG tablet Take 180 mg by mouth daily as needed.       . fluticasone (FLONASE) 50 MCG/ACT nasal spray Place 2 sprays into the nose daily as needed.      . ranitidine (ZANTAC) 150 MG tablet Take 150 mg by mouth 2 (two) times daily as needed.       . simvastatin (ZOCOR) 20 MG tablet Take 1 tablet (20 mg total) by mouth daily.  90 tablet  3  . ALPRAZolam (XANAX) 0.5 MG tablet Take 1 tablet (0.5 mg total) by mouth at bedtime as needed for sleep or anxiety.  20 tablet  0  . NON FORMULARY Vitamin B-12 2500 mcg. Sublingual daily.            Review of Systems Review of Systems  Constitutional: Negative for fever, appetite change, fatigue and unexpected weight change.  Eyes: Negative for pain and visual disturbance.  Respiratory: Negative for cough and shortness of breath.  no pnd or orthopnea  Cardiovascular: Negative for cp or palpitations    Gastrointestinal: Negative for nausea, diarrhea and constipation.  Genitourinary: Negative for urgency and frequency.  Skin: Negative for pallor or rash   MSK pos for swelling of feet and ankles-worse after vacation  Neurological: Negative  for weakness, light-headedness, numbness and headaches.  Hematological: Negative for adenopathy. Does not bruise/bleed easily.  Psychiatric/Behavioral: Negative for dysphoric mood. The patient is not nervous/anxious.           Objective:   Physical Exam  Constitutional: She appears well-developed and well-nourished. No distress.  HENT:  Head: Normocephalic and atraumatic.  Right Ear: External ear normal.  Left Ear: External ear normal.  Nose: Nose normal.  Mouth/Throat: Oropharynx is clear and moist.  Eyes: Conjunctivae and EOM are normal. Pupils are equal, round, and reactive to light. No scleral icterus.  Neck: Normal range of motion. Neck supple. No JVD present. No thyromegaly present.  Cardiovascular: Normal rate, regular rhythm, normal heart sounds and intact distal pulses.  Exam reveals no gallop.   Pulmonary/Chest: Effort normal and breath sounds normal. No respiratory distress. She has no wheezes.  Abdominal: Soft. Bowel sounds are normal. She exhibits no distension and no mass. There is no tenderness.  Musculoskeletal: Normal range of motion. She exhibits edema. She exhibits no tenderness.       Trace edema in lower legs and ankles   No acute joint changes  Lymphadenopathy:    She has no cervical adenopathy.  Neurological: She is alert. She has normal reflexes. No cranial nerve deficit. She exhibits normal muscle tone. Coordination normal.  Skin: Skin is warm and dry. No rash noted. No erythema. No pallor.       Fair complexion  Psychiatric: She has a normal mood and affect.          Assessment & Plan:

## 2011-10-18 NOTE — Assessment & Plan Note (Signed)
Reviewed health habits including diet and exercise and skin cancer prevention Also reviewed health mt list, fam hx and immunizations   Rev wellness labs in detail  Doing very well  Has gyn for gyn care

## 2011-10-18 NOTE — Assessment & Plan Note (Signed)
Lipids are much improved with diet and statin  No side eff Disc goals for lipids and reasons to control them Rev labs with pt Rev low sat fat diet in detail

## 2012-08-23 ENCOUNTER — Other Ambulatory Visit: Payer: Self-pay | Admitting: Family Medicine

## 2012-08-24 NOTE — Telephone Encounter (Signed)
Ok to refill? No recent appt and no future appt 

## 2012-08-24 NOTE — Telephone Encounter (Signed)
Please schedule PE this summer and refil until then

## 2012-08-25 NOTE — Telephone Encounter (Signed)
Left voicemail requesting pt to call office 

## 2012-08-25 NOTE — Telephone Encounter (Signed)
appt scheduled in July and meds refilled

## 2012-09-01 ENCOUNTER — Ambulatory Visit (INDEPENDENT_AMBULATORY_CARE_PROVIDER_SITE_OTHER): Payer: BC Managed Care – PPO | Admitting: Family Medicine

## 2012-09-01 ENCOUNTER — Encounter: Payer: Self-pay | Admitting: Family Medicine

## 2012-09-01 VITALS — BP 142/82 | HR 77 | Temp 98.6°F | Ht 65.25 in

## 2012-09-01 DIAGNOSIS — R1900 Intra-abdominal and pelvic swelling, mass and lump, unspecified site: Secondary | ICD-10-CM

## 2012-09-01 NOTE — Assessment & Plan Note (Signed)
Pelvic mass in R adnexal area in pt with prior oophrectomy in that area (has also had CS)- remote hx of endometriosis Solid lesion- occ pain (minimal) and neg ca 125 Wants 2nd op about what this could be -wonders about adhesions  Is high risk surg candidate due to clotting disorder Will ref to gyn to get an opinion

## 2012-09-01 NOTE — Progress Notes (Signed)
Subjective:    Patient ID: Kristine [name], female    DOB: 28-Feb-1956, 57 y.o.   MRN: [id number]  HPI Here for f/u of gyn issue  [facility] for routine gyn exam- she had mentioned some bloating at that time  (has hx of IBS) as well as a little R sided pelvic pain - fleeting for several months  Investigated by ultrasound   Told she likely had bowel loop with stool in it  She took a laxative - and on f/u US it was still there   colonosc 2/13 was fine    Pt has seen her gyn doctor for issue of R adnexal?/ pelvic solid mass seen on [place] and CT 4 cm by 1 by 1.4 It has ben called a pelvic nodule and ? Of calcification / not vascular or cystic in nature  Ca 125 neg at 17  She has had R oophrectomy for endometriosis and ovarian cyst (did have adhesions at the time)  Patient Active Problem List  Diagnosis  . NEVUS, ATYPICAL  . HYPERCHOLESTEROLEMIA, PURE  . CONGENITAL DEFICIENCY OF OTHER CLOTTING FACTORS  . ANXIETY  . DEPRESSION  . PAT  . ESOPHAGITIS  . GASTROESOPHAGEAL REFLUX DISEASE  . GASTRITIS  . IBS  . SCIATICA  . UNSPECIFIED SYNOVITIS AND TENOSYNOVITIS  . LIVER FUNCTION TESTS, ABNORMAL  . DVT, HX OF  . MIGRAINES, HX OF  . FRACTURE, RADIUS  . Cough  . Rhinitis  . Routine general medical examination at a health care facility  . Obesity   Past Medical History  Diagnosis Date  . DVT (deep venous thrombosis) 1/08  . Anxiety   . Depression   . Factor V Leiden     On coag x 1 year ending in 09  . Basal cell carcinoma     face  . Basal cell cancer   . Radial head fracture 2011  . Gastritis 1990    hospital- abd pain  . Esophageal erosions   . GERD (gastroesophageal reflux disease)    Past Surgical History  Procedure Laterality Date  . Cholecystectomy    . Oophorectomy    . Shoulder surgery  04/2006  . Mohs surgery  7/11    basal cell carcinoma near eye  . Tonsillectomy  1976  . Colonoscopy    . Upper gastrointestinal endoscopy     History   Substance Use Topics  . Smoking status: Never Smoker   . Smokeless tobacco: Never Used  . Alcohol Use: Yes     Comment: occ   Family History  Problem Relation Age of Onset  . Heart attack Father     x 3   . Graves' disease Mother   . Depression Mother   . Osteoporosis Mother   . Breast cancer Sister   . Heart attack Brother     x 2  . Diabetes Brother   . Hypertension Brother   . Hypertension Sister   . Coronary artery disease Brother     with stents   . Atrial fibrillation Brother   . Colon cancer Neg Hx    Allergies  Allergen Reactions  . Amoxicillin-Pot Clavulanate     REACTION: GI  . Calcium     REACTION: muscle aches  . Erythromycin     REACTION: GI  . Guaifenesin     REACTION: GI  . Naproxen     REACTION: GI  . Oxycodone-Acetaminophen     REACTION: reaction not known  .  Oxycodone-Aspirin     REACTION: reaction not known  . Sertraline Hcl     REACTION: hives   Current Outpatient Prescriptions on File Prior to Visit  Medication Sig Dispense Refill  . acetaminophen (TYLENOL) 500 MG tablet Take 500 mg by mouth every 6 (six) hours as needed.        Marland Kitchen aspirin 81 MG tablet Take 81 mg by mouth daily.        Marland Kitchen buPROPion (WELLBUTRIN SR) 150 MG 12 hr tablet Take 1 tablet (150 mg total) by mouth daily.  90 tablet  3  . fexofenadine (ALLEGRA) 180 MG tablet Take 180 mg by mouth daily as needed.       . fluticasone (FLONASE) 50 MCG/ACT nasal spray Place 2 sprays into the nose daily as needed.      . ranitidine (ZANTAC) 150 MG tablet Take 150 mg by mouth 2 (two) times daily as needed.       . simvastatin (ZOCOR) 20 MG tablet TAKE 1 TABLET BY MOUTH DAILY  90 tablet  1  . ALPRAZolam (XANAX) 0.25 MG tablet Take 0.25 mg by mouth daily as needed.        No current facility-administered medications on file prior to visit.      Review of Systems Review of Systems  Constitutional: Negative for fever, appetite change, fatigue and unexpected weight change.  Eyes: Negative  for pain and visual disturbance.  Respiratory: Negative for cough and shortness of breath.   Cardiovascular: Negative for cp or palpitations    Gastrointestinal: Negative for nausea, pos for IBS with occ bloating / neg for blood in stool Genitourinary: Negative for urgency and frequency. neg for hematuria or urinary pain  Skin: Negative for pallor or rash   Neurological: Negative for weakness, light-headedness, numbness and headaches.  Hematological: Negative for adenopathy. Does not bruise/bleed easily.  Psychiatric/Behavioral: Negative for dysphoric mood. The patient is not nervous/anxious.         Objective:   Physical Exam  Constitutional: She appears well-developed and well-nourished. No distress.  obese and well appearing   HENT:  Head: Normocephalic and atraumatic.  Mouth/Throat: Oropharynx is clear and moist.  Eyes: Conjunctivae and EOM are normal. Pupils are equal, round, and reactive to light.  Cardiovascular: Normal rate and regular rhythm.   Abdominal: Soft. Bowel sounds are normal. She exhibits no distension and no mass. There is no tenderness. There is no rebound and no guarding.  Musculoskeletal: She exhibits no edema.  Neurological: She is alert. She exhibits normal muscle tone. Coordination normal.  Skin: Skin is warm and dry. No rash noted. No erythema. No pallor.  Psychiatric: She has a normal mood and affect.          Assessment & Plan:

## 2012-09-01 NOTE — Patient Instructions (Addendum)
We will do gyn referral at check out  If symptoms worsen in the meantime let me know

## 2012-11-22 ENCOUNTER — Telehealth: Payer: Self-pay | Admitting: Family Medicine

## 2012-11-22 DIAGNOSIS — Z0000 Encounter for general adult medical examination without abnormal findings: Secondary | ICD-10-CM

## 2012-11-22 DIAGNOSIS — E7800 Pure hypercholesterolemia, unspecified: Secondary | ICD-10-CM

## 2012-11-22 NOTE — Telephone Encounter (Signed)
Message copied by Judy Pimple on Sun Nov 22, 2012 12:58 PM ------      Message from: Baldomero Lamy      Created: Wed Nov 18, 2012 11:23 AM      Regarding: Cpx labs Mon 11/23/12       Please order  future cpx labs for pt's upcoming lab appt.      Thanks      Tasha       ------

## 2012-11-23 ENCOUNTER — Other Ambulatory Visit (INDEPENDENT_AMBULATORY_CARE_PROVIDER_SITE_OTHER): Payer: BC Managed Care – PPO

## 2012-11-23 DIAGNOSIS — E7800 Pure hypercholesterolemia, unspecified: Secondary | ICD-10-CM

## 2012-11-23 DIAGNOSIS — Z0000 Encounter for general adult medical examination without abnormal findings: Secondary | ICD-10-CM

## 2012-11-23 DIAGNOSIS — R945 Abnormal results of liver function studies: Secondary | ICD-10-CM

## 2012-11-23 LAB — CBC WITH DIFFERENTIAL/PLATELET
Basophils Absolute: 0.1 10*3/uL (ref 0.0–0.1)
Basophils Relative: 0.9 % (ref 0.0–3.0)
Eosinophils Absolute: 0.2 10*3/uL (ref 0.0–0.7)
Lymphocytes Relative: 25.4 % (ref 12.0–46.0)
MCHC: 33.3 g/dL (ref 30.0–36.0)
Monocytes Relative: 7.8 % (ref 3.0–12.0)
Neutrophils Relative %: 62 % (ref 43.0–77.0)
RBC: 4.52 Mil/uL (ref 3.87–5.11)

## 2012-11-23 LAB — COMPREHENSIVE METABOLIC PANEL
ALT: 18 U/L (ref 0–35)
AST: 14 U/L (ref 0–37)
Albumin: 4 g/dL (ref 3.5–5.2)
BUN: 17 mg/dL (ref 6–23)
Calcium: 9 mg/dL (ref 8.4–10.5)
Chloride: 107 mEq/L (ref 96–112)
Potassium: 4.1 mEq/L (ref 3.5–5.1)

## 2012-11-23 LAB — TSH: TSH: 1.96 u[IU]/mL (ref 0.35–5.50)

## 2012-11-23 LAB — LIPID PANEL: Total CHOL/HDL Ratio: 2

## 2012-11-27 ENCOUNTER — Encounter: Payer: Self-pay | Admitting: Family Medicine

## 2012-11-27 ENCOUNTER — Ambulatory Visit (INDEPENDENT_AMBULATORY_CARE_PROVIDER_SITE_OTHER): Payer: BC Managed Care – PPO | Admitting: Family Medicine

## 2012-11-27 VITALS — BP 116/70 | HR 95 | Temp 98.7°F | Ht 65.0 in

## 2012-11-27 DIAGNOSIS — E7800 Pure hypercholesterolemia, unspecified: Secondary | ICD-10-CM

## 2012-11-27 DIAGNOSIS — Z0000 Encounter for general adult medical examination without abnormal findings: Secondary | ICD-10-CM

## 2012-11-27 DIAGNOSIS — E669 Obesity, unspecified: Secondary | ICD-10-CM

## 2012-11-27 MED ORDER — SIMVASTATIN 20 MG PO TABS: 20.0000 mg | ORAL_TABLET | Freq: Every day | ORAL | Status: DC

## 2012-11-27 MED ORDER — BUPROPION HCL ER (XL) 150 MG PO TB24: 150.0000 mg | ORAL_TABLET | Freq: Every day | ORAL | Status: DC

## 2012-11-27 NOTE — Patient Instructions (Addendum)
Keep working on healthy diet and exercise  Cut sugars in diet as much as you can  App- "my fitness pal"- is a great way to count calories and keep accountable

## 2012-11-27 NOTE — Progress Notes (Signed)
Subjective:    Patient ID: Kristine [name], female    DOB: 23-Mar-1956, 57 y.o.   MRN: [id number]  HPI Here for health maintenance exam and to review chronic medical problems    Is doing ok overall  Some sinus troubles  Has had to deal with plantar fasciitis and heel spur - has not seen someone yet - will try to tape it for a while   Going to yoga  bmi was 32 at last visit Flu vaccine- did have this fall  Mammogram 1/14 with gyn  Gyn visit 2/14- pap normal - watching an area and has an ultrasound planned in the fall as well  Td 7/09  Colonoscopy in 2013 - up to date   Labs Lab Results  Component Value Date   CHOL 141 11/23/2012   CHOL 135 10/15/2011   CHOL 190 10/29/2006   Lab Results  Component Value Date   HDL 62.80 11/23/2012   HDL 32.35 10/15/2011   HDL 57.0 10/29/2006   Lab Results  Component Value Date   LDLCALC 64 11/23/2012   LDLCALC 60 10/15/2011   LDLCALC 120* 10/29/2006   Lab Results  Component Value Date   TRIG 73.0 11/23/2012   TRIG 65.0 10/15/2011   TRIG 64 10/29/2006   Lab Results  Component Value Date   CHOLHDL 2 11/23/2012   CHOLHDL 2 10/15/2011   CHOLHDL 3.3 CALC 10/29/2006   Lab Results  Component Value Date   LDLDIRECT 146.1 08/19/2006   on zocor and diet - doing a great job   She declined to weigh today - is trying hard to loose weight -retaining fluid with the heat and humidity She is eaten much more fruit and veg   Sugar was 102 fasting   Patient Active Problem List   Diagnosis Date Noted  . Pelvic mass in female 09/01/2012  . Obesity 06/16/2011  . Rhinitis 06/14/2011  . Routine general medical examination at a health care facility 06/14/2011  . Cough 12/19/2010  . FRACTURE, RADIUS 05/30/2010  . SCIATICA 05/22/2009  . UNSPECIFIED SYNOVITIS AND TENOSYNOVITIS 05/22/2009  . GASTROESOPHAGEAL REFLUX DISEASE 04/14/2009  . CONGENITAL DEFICIENCY OF OTHER CLOTTING FACTORS 12/07/2007  . NEVUS, ATYPICAL 11/25/2006  . ANXIETY 11/25/2006  .  DEPRESSION 11/25/2006  . PAT 11/25/2006  . ESOPHAGITIS 11/25/2006  . GASTRITIS 11/25/2006  . IBS 11/25/2006  . LIVER FUNCTION TESTS, ABNORMAL 11/25/2006  . DVT, HX OF 11/25/2006  . MIGRAINES, HX OF 11/25/2006  . HYPERCHOLESTEROLEMIA, PURE 10/29/2006   Past Medical History  Diagnosis Date  . DVT (deep venous thrombosis) 1/08  . Anxiety   . Depression   . Factor V Leiden     On coag x 1 year ending in 09  . Basal cell carcinoma     face  . Basal cell cancer   . Radial head fracture 2011  . Gastritis 1990    hospital- abd pain  . Esophageal erosions   . GERD (gastroesophageal reflux disease)    Past Surgical History  Procedure Laterality Date  . Cholecystectomy    . Oophorectomy    . Shoulder surgery  04/2006  . Mohs surgery  7/11    basal cell carcinoma near eye  . Tonsillectomy  1976  . Colonoscopy    . Upper gastrointestinal endoscopy     History  Substance Use Topics  . Smoking status: Never Smoker   . Smokeless tobacco: Never Used  . Alcohol Use: Yes     Comment: occ  Family History  Problem Relation Age of Onset  . Heart attack Father     x 3   . Graves' disease Mother   . Depression Mother   . Osteoporosis Mother   . Breast cancer Sister   . Heart attack Brother     x 2  . Diabetes Brother   . Hypertension Brother   . Hypertension Sister   . Coronary artery disease Brother     with stents   . Atrial fibrillation Brother   . Colon cancer Neg Hx    Allergies  Allergen Reactions  . Amoxicillin-Pot Clavulanate     REACTION: GI  . Calcium     REACTION: muscle aches  . Erythromycin     REACTION: GI  . Guaifenesin     REACTION: GI  . Naproxen     REACTION: GI  . Oxycodone-Acetaminophen     REACTION: reaction not known  . Oxycodone-Aspirin     REACTION: reaction not known  . Sertraline Hcl     REACTION: hives   Current Outpatient Prescriptions on File Prior to Visit  Medication Sig Dispense Refill  . acetaminophen (TYLENOL) 500 MG tablet  Take 500 mg by mouth every 6 (six) hours as needed.        Marland Kitchen aspirin 81 MG tablet Take 81 mg by mouth daily.        Marland Kitchen buPROPion (WELLBUTRIN SR) 150 MG 12 hr tablet Take 1 tablet (150 mg total) by mouth daily.  90 tablet  3  . fexofenadine (ALLEGRA) 180 MG tablet Take 180 mg by mouth daily as needed.       . fluticasone (FLONASE) 50 MCG/ACT nasal spray Place 2 sprays into the nose daily as needed.      . ranitidine (ZANTAC) 150 MG tablet Take 150 mg by mouth 2 (two) times daily as needed.       . simvastatin (ZOCOR) 20 MG tablet TAKE 1 TABLET BY MOUTH DAILY  90 tablet  1  . ALPRAZolam (XANAX) 0.25 MG tablet Take 0.25 mg by mouth daily as needed.        No current facility-administered medications on file prior to visit.    Review of Systems Review of Systems  Constitutional: Negative for fever, appetite change, fatigue and unexpected weight change.  Eyes: Negative for pain and visual disturbance.  Respiratory: Negative for cough and shortness of breath.   Cardiovascular: Negative for cp or palpitations    Gastrointestinal: Negative for nausea, diarrhea and constipation.  Genitourinary: Negative for urgency and frequency.  Skin: Negative for pallor or rash   Neurological: Negative for weakness, light-headedness, numbness and headaches.  Hematological: Negative for adenopathy. Does not bruise/bleed easily.  Psychiatric/Behavioral: Negative for dysphoric mood. The patient is not nervous/anxious.         Objective:   Physical Exam  Constitutional: She appears well-developed and well-nourished. No distress.  obese and well appearing   HENT:  Head: Normocephalic and atraumatic.  Right Ear: External ear normal.  Left Ear: External ear normal.  Nose: Nose normal.  Mouth/Throat: Oropharynx is clear and moist.  Eyes: Conjunctivae and EOM are normal. Pupils are equal, round, and reactive to light. Right eye exhibits no discharge. Left eye exhibits no discharge. No scleral icterus.  Neck:  Normal range of motion. Neck supple. No JVD present. Carotid bruit is not present. No thyromegaly present.  Cardiovascular: Normal rate, regular rhythm, normal heart sounds and intact distal pulses.  Exam reveals no gallop.  Pulmonary/Chest: Effort normal and breath sounds normal. No respiratory distress. She has no wheezes. She has no rales.  Abdominal: Soft. Bowel sounds are normal. She exhibits no distension, no abdominal bruit and no mass. There is no tenderness.  Musculoskeletal: She exhibits no edema and no tenderness.  Lymphadenopathy:    She has no cervical adenopathy.  Neurological: She is alert. She has normal reflexes. No cranial nerve deficit. She exhibits normal muscle tone. Coordination normal.  Skin: Skin is warm and dry. No rash noted. No erythema. No pallor.  Psychiatric: She has a normal mood and affect.          Assessment & Plan:

## 2012-11-29 NOTE — Assessment & Plan Note (Signed)
Reviewed health habits including diet and exercise and skin cancer prevention Also reviewed health mt list, fam hx and immunizations  Labs rev in detail

## 2012-11-29 NOTE — Assessment & Plan Note (Signed)
Discussed how this problem influences overall health and the risks it imposes  Reviewed plan for weight loss with lower calorie diet (via better food choices and also portion control or program like weight watchers) and exercise building up to or more than 30 minutes 5 days per week including some aerobic activity    Pt is motivated to work on wt loss-limited by foot pain for exercise

## 2012-11-29 NOTE — Assessment & Plan Note (Signed)
Disc goals for lipids and reasons to control them Rev labs with pt Rev low sat fat diet in detail On zocor and diet

## 2013-02-15 ENCOUNTER — Ambulatory Visit (INDEPENDENT_AMBULATORY_CARE_PROVIDER_SITE_OTHER): Payer: BC Managed Care – PPO | Admitting: Family Medicine

## 2013-02-15 ENCOUNTER — Encounter: Payer: Self-pay | Admitting: Family Medicine

## 2013-02-15 VITALS — BP 130/86 | HR 70 | Temp 98.4°F | Ht 65.0 in

## 2013-02-15 DIAGNOSIS — L255 Unspecified contact dermatitis due to plants, except food: Secondary | ICD-10-CM

## 2013-02-15 DIAGNOSIS — L237 Allergic contact dermatitis due to plants, except food: Secondary | ICD-10-CM | POA: Insufficient documentation

## 2013-02-15 MED ORDER — PREDNISONE 10 MG PO TABS: ORAL_TABLET | ORAL | Status: DC

## 2013-02-15 NOTE — Progress Notes (Signed)
Subjective:    Patient ID: Kristine [name], female    DOB: August 10, 1955, 57 y.o.   MRN: [id number]  HPI Here with what she thinks is dermatitis (poison ivy or oak)   Was working outdoors a week ago and then rash started on R 5th finger last Sunday- by Thursday had a few spots on face (blistery looking as well)- now sees it on fingers/arms/ upper torso and some on upper leg  Itchy Area on face looks more inflammed  She does not scratch  She has used hydrocort cream and claritin and oral benadryl Also calamine lotions   Has always been very allergic to poison ivy   Patient Active Problem List   Diagnosis Date Noted  . Pelvic mass in female 09/01/2012  . Obesity 06/16/2011  . Rhinitis 06/14/2011  . Routine general medical examination at a health care facility 06/14/2011  . SCIATICA 05/22/2009  . GASTROESOPHAGEAL REFLUX DISEASE 04/14/2009  . CONGENITAL DEFICIENCY OF OTHER CLOTTING FACTORS 12/07/2007  . ANXIETY 11/25/2006  . DEPRESSION 11/25/2006  . PAT 11/25/2006  . ESOPHAGITIS 11/25/2006  . GASTRITIS 11/25/2006  . IBS 11/25/2006  . DVT, HX OF 11/25/2006  . MIGRAINES, HX OF 11/25/2006  . HYPERCHOLESTEROLEMIA, PURE 10/29/2006   Past Medical History  Diagnosis Date  . DVT (deep venous thrombosis) 1/08  . Anxiety   . Depression   . Factor V Leiden     On coag x 1 year ending in 09  . Basal cell carcinoma     face  . Basal cell cancer   . Radial head fracture 2011  . Gastritis 1990    hospital- abd pain  . Esophageal erosions   . GERD (gastroesophageal reflux disease)    Past Surgical History  Procedure Laterality Date  . Cholecystectomy    . Oophorectomy    . Shoulder surgery  04/2006  . Mohs surgery  7/11    basal cell carcinoma near eye  . Tonsillectomy  1976  . Colonoscopy    . Upper gastrointestinal endoscopy     History  Substance Use Topics  . Smoking status: Never Smoker   . Smokeless tobacco: Never Used  . Alcohol Use: Yes     Comment: occ    Family History  Problem Relation Age of Onset  . Heart attack Father     x 3   . Graves' disease Mother   . Depression Mother   . Osteoporosis Mother   . Breast cancer Sister   . Heart attack Brother     x 2  . Diabetes Brother   . Hypertension Brother   . Hypertension Sister   . Coronary artery disease Brother     with stents   . Atrial fibrillation Brother   . Colon cancer Neg Hx    Allergies  Allergen Reactions  . Amoxicillin-Pot Clavulanate     REACTION: GI  . Calcium     REACTION: muscle aches  . Erythromycin     REACTION: GI  . Guaifenesin     REACTION: GI  . Naproxen     REACTION: GI  . Oxycodone-Acetaminophen     REACTION: reaction not known  . Oxycodone-Aspirin     REACTION: reaction not known  . Sertraline Hcl     REACTION: hives   Current Outpatient Prescriptions on File Prior to Visit  Medication Sig Dispense Refill  . acetaminophen (TYLENOL) 500 MG tablet Take 500 mg by mouth every 6 (six) hours as needed.        Marland Kitchen  ALPRAZolam (XANAX) 0.25 MG tablet Take 0.25 mg by mouth daily as needed.       Marland Kitchen aspirin 81 MG tablet Take 81 mg by mouth daily.        Marland Kitchen buPROPion (WELLBUTRIN XL) 150 MG 24 hr tablet Take 1 tablet (150 mg total) by mouth daily.  90 tablet  3  . fexofenadine (ALLEGRA) 180 MG tablet Take 180 mg by mouth daily as needed.       . fluticasone (FLONASE) 50 MCG/ACT nasal spray Place 2 sprays into the nose daily as needed.      . loratadine (CLARITIN) 10 MG tablet Take 10 mg by mouth as needed for allergies.      . ranitidine (ZANTAC) 150 MG tablet Take 150 mg by mouth 2 (two) times daily as needed.       . simvastatin (ZOCOR) 20 MG tablet Take 1 tablet (20 mg total) by mouth daily.  90 tablet  3   No current facility-administered medications on file prior to visit.      Review of Systems Review of Systems  Constitutional: Negative for fever, appetite change, fatigue and unexpected weight change.  Eyes: Negative for pain and visual  disturbance.  Respiratory: Negative for cough and shortness of breath.   Cardiovascular: Negative for cp or palpitations    Gastrointestinal: Negative for nausea, diarrhea and constipation.  Genitourinary: Negative for urgency and frequency.  Skin: Negative for pallor and pos for itchy rash Neurological: Negative for weakness, light-headedness, numbness and headaches.  Hematological: Negative for adenopathy. Does not bruise/bleed easily.  Psychiatric/Behavioral: Negative for dysphoric mood. The patient is not nervous/anxious.         Objective:   Physical Exam  Constitutional: She appears well-developed and well-nourished.  obese and well appearing   HENT:  Head: Normocephalic and atraumatic.  Eyes: Conjunctivae and EOM are normal. Pupils are equal, round, and reactive to light. Right eye exhibits no discharge. Left eye exhibits no discharge.  Neck: Normal range of motion. Neck supple.  Musculoskeletal: She exhibits no edema.  Lymphadenopathy:    She has no cervical adenopathy.  Neurological: She is alert.  Skin: Skin is warm and dry. Rash noted. There is erythema.  Patches of vesicles with erythema and some linear streaks of vesicles -hands/ arms/ R face and R upper leg  No pustules or swelling   Psychiatric: She has a normal mood and affect.          Assessment & Plan:

## 2013-02-15 NOTE — Assessment & Plan Note (Signed)
Symptomatic in several areas -now worsening  Will tx with prednisone 30 mg taper Symptom relief-cool compresses/ otc cortisone cream/ calamine as needed and oral antihistamines See AVS- recommend eradicating plant and anything with oil on it

## 2013-02-15 NOTE — Patient Instructions (Addendum)
I think you have plant contact dermatitis  Clean up anything that has contacted the oil  Stay cool Take the prednisone as directed  Cortisone cream is ok as well Update if not starting to improve in a week or if worsening

## 2013-03-18 ENCOUNTER — Other Ambulatory Visit: Payer: Self-pay

## 2013-05-31 ENCOUNTER — Encounter: Payer: Self-pay | Admitting: Internal Medicine

## 2013-05-31 ENCOUNTER — Ambulatory Visit (INDEPENDENT_AMBULATORY_CARE_PROVIDER_SITE_OTHER): Payer: BC Managed Care – PPO | Admitting: Internal Medicine

## 2013-05-31 VITALS — BP 132/84 | HR 74 | Temp 98.3°F

## 2013-05-31 DIAGNOSIS — J019 Acute sinusitis, unspecified: Secondary | ICD-10-CM

## 2013-05-31 MED ORDER — CEFUROXIME AXETIL 500 MG PO TABS
500.0000 mg | ORAL_TABLET | Freq: Two times a day (BID) | ORAL | Status: DC
Start: 2013-05-31 — End: 2013-06-21

## 2013-05-31 NOTE — Progress Notes (Signed)
HPI  Pt presents to the clinic today with c/o headache, nasal congestion, cough, chest congestion and sore throat. This started about 4 weeks ago but has gotten much worse over the last week. The cough is productive of thick green mucous. She has tried Robitussin, Tylenol, Advil, Mucinex, saline nasal spray. These have not helped that much. She denies fever, chills or body aches. She does have a history of allergies but denies breathing problems. She is taking Allegra. She has had sick contacts.  Review of Systems    Past Medical History  Diagnosis Date  . DVT (deep venous thrombosis) 1/08  . Anxiety   . Depression   . Factor V Leiden     On coag x 1 year ending in 09  . Basal cell carcinoma     face  . Basal cell cancer   . Radial head fracture 2011  . Gastritis 1990    hospital- abd pain  . Esophageal erosions   . GERD (gastroesophageal reflux disease)     Family History  Problem Relation Age of Onset  . Heart attack Father     x 3   . Graves' disease Mother   . Depression Mother   . Osteoporosis Mother   . Breast cancer Sister   . Heart attack Brother     x 2  . Diabetes Brother   . Hypertension Brother   . Hypertension Sister   . Coronary artery disease Brother     with stents   . Atrial fibrillation Brother   . Colon cancer Neg Hx     History   Social History  . Marital Status: Married    Spouse Name: N/A    Number of Children: 1  . Years of Education: N/A   Occupational History  . Nurse     Social History Main Topics  . Smoking status: Never Smoker   . Smokeless tobacco: Never Used  . Alcohol Use: Yes     Comment: occ  . Drug Use: No  . Sexual Activity: Not on file   Other Topics Concern  . Not on file   Social History Narrative   Some walking for exercise         Hematology - Dr. Catarina Hartshorn - Dr. Latanya Maudlin   [facility]- [facility]- Dr. Rosana Berger    Allergies  Allergen Reactions  . Amoxicillin-Pot Clavulanate     REACTION: GI  . Calcium      REACTION: muscle aches  . Erythromycin     REACTION: GI  . Guaifenesin     REACTION: GI  . Naproxen     REACTION: GI  . Oxycodone-Acetaminophen     REACTION: reaction not known  . Oxycodone-Aspirin     REACTION: reaction not known  . Sertraline Hcl     REACTION: hives     Constitutional: Positive headache, fatigue. Denies fever or abrupt weight changes.  HEENT:  Positive eye pain, pressure behind the eyes, facial pain, nasal congestion and sore throat. Denies eye redness, ear pain, ringing in the ears, wax buildup, runny nose or bloody nose. Respiratory: Positive cough and thick green sputum production. Denies difficulty breathing or shortness of breath.  Cardiovascular: Denies chest pain, chest tightness, palpitations or swelling in the hands or feet.   No other specific complaints in a complete review of systems (except as listed in HPI above).  Objective:    BP 132/84  Pulse 74  Temp(Src) 98.3 F (36.8 C) (Oral)  SpO2 98% Wt Readings from Last 3 Encounters:  10/18/11 198 lb 4 oz (89.926 kg)  06/07/11 197 lb (89.359 kg)  05/24/11 197 lb (89.359 kg)    General: Appears her stated age, well developed, well nourished in NAD. HEENT: Head: normal shape and size, bilateral frontal sinus tenderness; Eyes: sclera white, no icterus, conjunctiva pink, PERRLA and EOMs intact; Ears: Tm's gray and intact, normal light reflex; Nose: mucosa pink and moist, septum midline; Throat/Mouth: + PND. Teeth present, mucosa pink and moist, no exudate noted, no lesions or ulcerations noted.  Neck: Mild cervical lymphadenopathy. Neck supple, trachea midline. No massses, lumps or thyromegaly present.  Cardiovascular: Normal rate and rhythm. S1,S2 noted.  No murmur, rubs or gallops noted. No JVD or BLE edema. No carotid bruits noted. Pulmonary/Chest: Normal effort and positive vesicular breath sounds. No respiratory distress. No wheezes, rales or ronchi noted.      Assessment & Plan:   Acute  bacterial sinusitis  Can use a Neti Pot which can be purchased from your local drug store. Flonase 2 sprays each nostril for 3 days and then as needed. Ceftin BID for 10 days  RTC as needed or if symptoms persist.

## 2013-05-31 NOTE — Progress Notes (Signed)
Pre-visit discussion using our clinic review tool. No additional management support is needed unless otherwise documented below in the visit note.  

## 2013-05-31 NOTE — Patient Instructions (Signed)

## 2013-06-14 LAB — HM MAMMOGRAPHY: HM MAMMO: NORMAL

## 2013-06-18 ENCOUNTER — Telehealth: Payer: Self-pay | Admitting: Family Medicine

## 2013-06-18 NOTE — Telephone Encounter (Signed)
No appts avail unless we have a cancellation I think Follow up when able - Saturday clinic may be a good option if she does not want to wait- and if worse or recurrent - seek care at ER or UC  Migraine can manifest like this but it deserves addnl eval

## 2013-06-18 NOTE — Telephone Encounter (Signed)
Patient Information:  Caller Name: Kristine [name]  Phone: [phone]  Patient: Kristine [name], Kristine [name]  Gender: Female  DOB: December 06, 1955  Age: 58 Years  PCP: Loura Pardon [facility])  Office Follow Up:  Does the office need to follow up with this patient?: Yes  Instructions For The Office: appt needs/callback please krs/can  RN Note:  Patient states had episode "event" 06/17/13 of sudden peripheral vision loss, blurred vision and lightheadedness.  No headache.  States episode lasted ~ 5 minutes.  States dizziness occurred when she moved her head.  States she did feel her left hand jerk.  Had a dull headache noted about 15 minutes after this occurred.  States an opthalmologist told her she had "silent migraines" several years ago.  During day 06/18/13 she seems better.  c/o fullness in her sinuses.  Per dizziness protocol, emergent symptoms denied; patient would like appt or discussion with Dr. Glori Bickers about this, as she is quite concerned.  No appts available.  Info to office via high priority for provider review/appt workin/callback.  May reach patient at [phone].  krs/can  Symptoms  Reason For Call & Symptoms: Patient has Fector V issue, hx of DVT  Reviewed Health History In EMR: Yes  Reviewed Medications In EMR: Yes  Reviewed Allergies In EMR: Yes  Reviewed Surgeries / Procedures: Yes  Date of Onset of Symptoms: 06/17/2013  Guideline(s) Used:  Dizziness  Disposition Per Guideline:   See Today in Office  Reason For Disposition Reached:   Patient wants to be seen  Advice Given:  N/A  Patient Will Follow Care Advice:  YES

## 2013-06-18 NOTE — Telephone Encounter (Signed)
Pt scheduled appt for Monday 06/21/13, advised pt to see ER or UC if sxs worsen over the weekend, pt verbalized understanding

## 2013-06-21 ENCOUNTER — Encounter: Payer: Self-pay | Admitting: Family Medicine

## 2013-06-21 ENCOUNTER — Ambulatory Visit (INDEPENDENT_AMBULATORY_CARE_PROVIDER_SITE_OTHER): Payer: BC Managed Care – PPO | Admitting: Family Medicine

## 2013-06-21 VITALS — BP 136/84 | HR 71 | Temp 98.4°F | Ht 65.0 in

## 2013-06-21 DIAGNOSIS — G43109 Migraine with aura, not intractable, without status migrainosus: Secondary | ICD-10-CM | POA: Insufficient documentation

## 2013-06-21 DIAGNOSIS — H6980 Other specified disorders of Eustachian tube, unspecified ear: Secondary | ICD-10-CM | POA: Insufficient documentation

## 2013-06-21 NOTE — Patient Instructions (Signed)
Get back on flonase for at least 2 weeks to open your ears/ sinuses   Drink fluids  Avoid caffeine Get enough sleep  Try to avoid getting overwhelmed or overbooked  If episodes continue - let me know or get to ER if severe or persistent

## 2013-06-21 NOTE — Progress Notes (Signed)
Pre-visit discussion using our clinic review tool. No additional management support is needed unless otherwise documented below in the visit note.  

## 2013-06-21 NOTE — Assessment & Plan Note (Signed)
Suspect pt's symptoms were aura to an atypical migraine  Nl neuro exam today  Many triggers - fatigue/ dehydration/ caffeine -disc lifestyle habits and given handout on ha  If this becomes recurrent -will update and consider further eval

## 2013-06-21 NOTE — Progress Notes (Signed)
Subjective:    Patient ID: Kristine [name], female    DOB: [birth year], 58 y.o.   MRN: [id number]  HPI Here for episode of vision change and lt headedness  Episode 06/17/13 5 pm - very busy day-driving to [place] and shopping and rushing to meet some friends  (had drank a coffee and felt a bit dehydrated) - also wondered if her sugar was too low  The dizziness was positional - with head movement , and felt a bit vasovagal  Eyes watered both sides  Oddly-her L hand "jerked" twice -that worried her  No facial droop or speech problem or hemiplegia  Peripheral vision loss both sides / some blurring of vision/ light headedness A dull headache started 15 min later Symptoms overall lasted 35 min Did eat and felt a lot better  Felt washed out in general -and dull headache thru the weekend - she took some advil finally and that helped  (has been sick on off with uri since xmas)- had just finished ceftin (saw Marshall Cork)  Just feels like she has 100 lb of pressure in head Taking mucinex      Patient Active Problem List   Diagnosis Date Noted  . Plant allergic contact dermatitis 02/15/2013  . Pelvic mass in female 09/01/2012  . Obesity 06/16/2011  . Rhinitis 06/14/2011  . Routine general medical examination at a health care facility 06/14/2011  . SCIATICA 05/22/2009  . GASTROESOPHAGEAL REFLUX DISEASE 04/14/2009  . CONGENITAL DEFICIENCY OF OTHER CLOTTING FACTORS 12/07/2007  . ANXIETY 11/25/2006  . DEPRESSION 11/25/2006  . PAT 11/25/2006  . ESOPHAGITIS 11/25/2006  . GASTRITIS 11/25/2006  . IBS 11/25/2006  . DVT, HX OF 11/25/2006  . MIGRAINES, HX OF 11/25/2006  . HYPERCHOLESTEROLEMIA, PURE 10/29/2006   Past Medical History  Diagnosis Date  . DVT (deep venous thrombosis) 1/08  . Anxiety   . Depression   . Factor V Leiden     On coag x 1 year ending in 09  . Basal cell carcinoma     face  . Basal cell cancer   . Radial head fracture 2011  . Gastritis 1990    hospital-  abd pain  . Esophageal erosions   . GERD (gastroesophageal reflux disease)    Past Surgical History  Procedure Laterality Date  . Cholecystectomy    . Oophorectomy    . Shoulder surgery  04/2006  . Mohs surgery  7/11    basal cell carcinoma near eye  . Tonsillectomy  1976  . Colonoscopy    . Upper gastrointestinal endoscopy     History  Substance Use Topics  . Smoking status: Never Smoker   . Smokeless tobacco: Never Used  . Alcohol Use: Yes     Comment: occ   Family History  Problem Relation Age of Onset  . Heart attack Father     x 3   . Graves' disease Mother   . Depression Mother   . Osteoporosis Mother   . Breast cancer Sister   . Heart attack Brother     x 2  . Diabetes Brother   . Hypertension Brother   . Hypertension Sister   . Coronary artery disease Brother     with stents   . Atrial fibrillation Brother   . Colon cancer Neg Hx    Allergies  Allergen Reactions  . Amoxicillin-Pot Clavulanate     REACTION: GI  . Calcium     REACTION: muscle aches  . Erythromycin  REACTION: GI  . Guaifenesin     REACTION: GI  . Naproxen     REACTION: GI  . Oxycodone-Acetaminophen     REACTION: reaction not known  . Oxycodone-Aspirin     REACTION: reaction not known  . Sertraline Hcl     REACTION: hives   Current Outpatient Prescriptions on File Prior to Visit  Medication Sig Dispense Refill  . acetaminophen (TYLENOL) 500 MG tablet Take 500 mg by mouth every 6 (six) hours as needed.        Marland Kitchen aspirin 81 MG tablet Take 81 mg by mouth daily.        Marland Kitchen buPROPion (WELLBUTRIN XL) 150 MG 24 hr tablet Take 1 tablet (150 mg total) by mouth daily.  90 tablet  3  . fexofenadine (ALLEGRA) 180 MG tablet Take 180 mg by mouth daily as needed.       . fluticasone (FLONASE) 50 MCG/ACT nasal spray Place 2 sprays into the nose daily as needed.      . loratadine (CLARITIN) 10 MG tablet Take 10 mg by mouth as needed for allergies.      . ranitidine (ZANTAC) 150 MG tablet Take 150  mg by mouth 2 (two) times daily as needed.       . simvastatin (ZOCOR) 20 MG tablet Take 1 tablet (20 mg total) by mouth daily.  90 tablet  3  . ALPRAZolam (XANAX) 0.25 MG tablet Take 0.25 mg by mouth daily as needed.       Marland Kitchen Ketotifen Fumarate (ALAWAY OP) Place 1 drop into both eyes 2 (two) times daily.       No current facility-administered medications on file prior to visit.    Still has sinus congestion and ear pain (ear pain since Friday-that subsided) , a little cough   Was told she had opthamalogic migraine in the past from an eye doctor    Review of Systems Review of Systems  Constitutional: Negative for fever, appetite change,  and unexpected weight change. pos for fatigue Eyes: Negative for pain and visual disturbance. (vision is back to normal) Respiratory: Negative for cough and shortness of breath.   Cardiovascular: Negative for cp or palpitations    Gastrointestinal: Negative for nausea, diarrhea and constipation.  Genitourinary: Negative for urgency and frequency.  Skin: Negative for pallor or rash   Neurological: Negative for , light-headedness, numbness and pos for  headaches. pos for positional dizziness/ vertigo  Hematological: Negative for adenopathy. Does not bruise/bleed easily.  Psychiatric/Behavioral: Negative for dysphoric mood. The patient is not nervous/anxious.         Objective:   Physical Exam  Constitutional: She appears well-developed and well-nourished. No distress.  obese and well appearing   HENT:  Head: Normocephalic and atraumatic.  Right Ear: External ear normal.  Left Ear: External ear normal.  Nose: Nose normal.  Mouth/Throat: Oropharynx is clear and moist.  Nares are boggy and mildly congested No sinus tenderness  TMs dull  Eyes: Conjunctivae and EOM are normal. Pupils are equal, round, and reactive to light. Right eye exhibits no discharge. Left eye exhibits no discharge. No scleral icterus.  Few beats of horizontal nystagmus  bilaterally  Neck: Normal range of motion. Neck supple. No JVD present. No thyromegaly present.  Cardiovascular: Normal rate, regular rhythm, normal heart sounds and intact distal pulses.  Exam reveals no gallop.   Pulmonary/Chest: Effort normal and breath sounds normal. No respiratory distress. She has no wheezes. She has no rales.  Abdominal: Soft.  Bowel sounds are normal. She exhibits no distension and no mass. There is no tenderness.  Musculoskeletal: She exhibits no edema and no tenderness.  Lymphadenopathy:    She has no cervical adenopathy.  Neurological: She is alert. She has normal reflexes. She displays no atrophy and no tremor. No cranial nerve deficit or sensory deficit. She exhibits normal muscle tone. She displays a negative Romberg sign. She displays no seizure activity. Coordination and gait normal.  No focal cerebellar signs  Nl gait   Skin: Skin is warm and dry. No rash noted. No erythema. No pallor.  Psychiatric: She has a normal mood and affect.          Assessment & Plan:

## 2013-06-21 NOTE — Assessment & Plan Note (Signed)
After uri/ congestion- poss triggering some vertigo and migraine  Adv to start flonase for at least 2 weeks  Update if worse or new symptoms

## 2013-06-25 ENCOUNTER — Other Ambulatory Visit: Payer: Self-pay | Admitting: Family Medicine

## 2013-08-26 ENCOUNTER — Encounter: Payer: Self-pay | Admitting: Family Medicine

## 2013-08-26 ENCOUNTER — Ambulatory Visit (INDEPENDENT_AMBULATORY_CARE_PROVIDER_SITE_OTHER): Payer: BC Managed Care – PPO | Admitting: Family Medicine

## 2013-08-26 VITALS — BP 112/68 | HR 75 | Temp 97.8°F | Wt 197.2 lb

## 2013-08-26 DIAGNOSIS — R109 Unspecified abdominal pain: Secondary | ICD-10-CM | POA: Insufficient documentation

## 2013-08-26 LAB — CBC WITH DIFFERENTIAL/PLATELET
Basophils Absolute: 0.1 K/uL (ref 0.0–0.1)
Basophils Relative: 1.2 % (ref 0.0–3.0)
Eosinophils Absolute: 0.2 K/uL (ref 0.0–0.7)
Eosinophils Relative: 3.4 % (ref 0.0–5.0)
HCT: 40 % (ref 36.0–46.0)
Hemoglobin: 13.1 g/dL (ref 12.0–15.0)
Lymphocytes Relative: 27.3 % (ref 12.0–46.0)
Lymphs Abs: 1.6 K/uL (ref 0.7–4.0)
MCHC: 32.8 g/dL (ref 30.0–36.0)
MCV: 85.7 fl (ref 78.0–100.0)
Monocytes Absolute: 0.4 K/uL (ref 0.1–1.0)
Monocytes Relative: 7.6 % (ref 3.0–12.0)
Neutro Abs: 3.5 K/uL (ref 1.4–7.7)
Neutrophils Relative %: 60.5 % (ref 43.0–77.0)
Platelets: 210 K/uL (ref 150.0–400.0)
RBC: 4.67 Mil/uL (ref 3.87–5.11)
RDW: 13.1 % (ref 11.5–14.6)
WBC: 5.7 K/uL (ref 4.5–10.5)

## 2013-08-26 LAB — COMPREHENSIVE METABOLIC PANEL WITH GFR
ALT: 16 U/L (ref 0–35)
AST: 13 U/L (ref 0–37)
Albumin: 3.7 g/dL (ref 3.5–5.2)
Alkaline Phosphatase: 62 U/L (ref 39–117)
BUN: 12 mg/dL (ref 6–23)
CO2: 27 meq/L (ref 19–32)
Calcium: 9.4 mg/dL (ref 8.4–10.5)
Chloride: 104 meq/L (ref 96–112)
Creatinine, Ser: 0.7 mg/dL (ref 0.4–1.2)
GFR: 89.55 mL/min
Glucose, Bld: 90 mg/dL (ref 70–99)
Potassium: 4.3 meq/L (ref 3.5–5.1)
Sodium: 139 meq/L (ref 135–145)
Total Bilirubin: 0.7 mg/dL (ref 0.3–1.2)
Total Protein: 7 g/dL (ref 6.0–8.3)

## 2013-08-26 LAB — LIPASE: Lipase: 57 U/L (ref 11.0–59.0)

## 2013-08-26 MED ORDER — DICYCLOMINE HCL 10 MG PO CAPS: 10.0000 mg | ORAL_CAPSULE | Freq: Three times a day (TID) | ORAL | Status: DC

## 2013-08-26 NOTE — Patient Instructions (Signed)
Go to the lab on the way out.  We'll contact you with your lab report. Bland diet, small meals, no alcohol.  Use bentyl if needed.   Follow up with Dr. Glori Bickers or Dr. Carlean Purl if needed.  Take care.

## 2013-08-26 NOTE — Assessment & Plan Note (Signed)
Presumed IBS sx.  Nontoxic, sx better now.  Would check basic labs and use bentyl prn along with bland diet and small amounts per meal.  She agrees.  F/u prn.  Routed to PCP as fyi.

## 2013-08-26 NOTE — Progress Notes (Signed)
Pre visit review using our clinic review tool, if applicable. No additional management support is needed unless otherwise documented below in the visit note.  Was eating a regular, nonfatty meal Friday night.  Epigastric fullness after the meal.   Felt very bloated after the meal. The next AM she had a small breakfast.  Then had B upper abd pain. Radiated to the back.  H/o dx of esophageal spasm.  H/o cholecystectomy.   She restarted her ranitidine and nexium.  No diarrhea. A lot of burping.  She has been fatigued, slept a lot in the meantime.  A lot of lower abd gurgling in the meantime, then a lot of flatus.  Since then the pain got better, after a few days.  BRAT type diet in meantime.    No vomiting with this episodes.  No blood in stool.  No fevers.  No pain now.  She feels better today but not fully back to baseline.  Nonsmoker. Minimal etoh.   She is leaving to go to [place] in a few days.    Meds, vitals, and allergies reviewed.   ROS: See HPI.  Otherwise, noncontributory.  nad ncat Mmm Neck supple, no LA rrr ctab Abd soft, not ttp, no RUQ pain, normal BS, no rebound Ext w/o edema

## 2013-09-10 ENCOUNTER — Other Ambulatory Visit: Payer: Self-pay | Admitting: Family Medicine

## 2013-10-26 ENCOUNTER — Encounter: Payer: Self-pay | Admitting: Family Medicine

## 2013-10-26 ENCOUNTER — Ambulatory Visit (INDEPENDENT_AMBULATORY_CARE_PROVIDER_SITE_OTHER): Payer: BC Managed Care – PPO | Admitting: Family Medicine

## 2013-10-26 VITALS — BP 140/88 | HR 92 | Temp 98.1°F

## 2013-10-26 DIAGNOSIS — R05 Cough: Secondary | ICD-10-CM

## 2013-10-26 DIAGNOSIS — R059 Cough, unspecified: Secondary | ICD-10-CM

## 2013-10-26 MED ORDER — AZITHROMYCIN 250 MG PO TABS: ORAL_TABLET | ORAL | Status: DC

## 2013-10-26 MED ORDER — ALBUTEROL SULFATE HFA 108 (90 BASE) MCG/ACT IN AERS: 1.0000 | INHALATION_SPRAY | Freq: Four times a day (QID) | RESPIRATORY_TRACT | Status: DC | PRN

## 2013-10-26 NOTE — Patient Instructions (Signed)
Use the inhaler as needed and start the antibiotics today. Glad to see you.

## 2013-10-26 NOTE — Progress Notes (Signed)
Pre visit review using our clinic review tool, if applicable. No additional management support is needed unless otherwise documented below in the visit note.  Sick for 2 weeks.  Fist noted a cough and feeling tired.  Aches.  No fevers known.  Some sputum, occ, yesterday with discolored sputum.  Usually dry cough, sounds wet but she isn't usually getting much up.  Chest feels tight.  SABA helped but made her jittery (2 puffs).  No vomiting, no diarrhea.  She felt a little better recently, but then back slid and felt worse again.  Some ST initially, not now. No ear pain.  Used some left over tessalon perles, no help.  Delsym didn't help.   Meds, vitals, and allergies reviewed.   ROS: See HPI.  Otherwise, noncontributory.  GEN: nad, alert and oriented HEENT: mucous membranes moist, tm w/o erythema, nasal exam w/o erythema, scant clear discharge noted,  OP with mild cobblestoning NECK: supple w/o LA CV: rrr.   PULM: coarse BS but ctag o/w, no wheeze, no inc wob EXT: no edema SKIN: no acute rash

## 2013-10-27 DIAGNOSIS — R059 Cough, unspecified: Secondary | ICD-10-CM | POA: Insufficient documentation

## 2013-10-27 DIAGNOSIS — R05 Cough: Secondary | ICD-10-CM | POA: Insufficient documentation

## 2013-10-27 NOTE — Assessment & Plan Note (Signed)
Ongoing for 2 weeks, improved with SABA.  rx'd SABA with routine cautions.  Start zmax to cover atypicals and f/u prn. Nontoxic. She agrees with plan.  Supportive tx o/w.

## 2013-12-24 ENCOUNTER — Encounter: Payer: Self-pay | Admitting: Internal Medicine

## 2014-04-12 ENCOUNTER — Other Ambulatory Visit: Payer: Self-pay | Admitting: Family Medicine

## 2014-04-13 ENCOUNTER — Encounter: Payer: Self-pay | Admitting: Family Medicine

## 2014-04-13 ENCOUNTER — Ambulatory Visit (INDEPENDENT_AMBULATORY_CARE_PROVIDER_SITE_OTHER): Payer: BC Managed Care – PPO | Admitting: Family Medicine

## 2014-04-13 VITALS — BP 138/84 | HR 72 | Temp 98.0°F | Ht 65.0 in | Wt 194.2 lb

## 2014-04-13 DIAGNOSIS — Z0000 Encounter for general adult medical examination without abnormal findings: Secondary | ICD-10-CM

## 2014-04-13 DIAGNOSIS — E7800 Pure hypercholesterolemia, unspecified: Secondary | ICD-10-CM

## 2014-04-13 DIAGNOSIS — E669 Obesity, unspecified: Secondary | ICD-10-CM

## 2014-04-13 DIAGNOSIS — R0981 Nasal congestion: Secondary | ICD-10-CM | POA: Insufficient documentation

## 2014-04-13 MED ORDER — ALPRAZOLAM 0.25 MG PO TABS: 0.2500 mg | ORAL_TABLET | Freq: Every day | ORAL | Status: DC | PRN

## 2014-04-13 MED ORDER — LEVOFLOXACIN 500 MG PO TABS: 500.0000 mg | ORAL_TABLET | Freq: Every day | ORAL | Status: DC

## 2014-04-13 MED ORDER — BUPROPION HCL ER (XL) 150 MG PO TB24: 150.0000 mg | ORAL_TABLET | Freq: Every day | ORAL | Status: DC

## 2014-04-13 MED ORDER — CYCLOBENZAPRINE HCL 10 MG PO TABS: 5.0000 mg | ORAL_TABLET | Freq: Three times a day (TID) | ORAL | Status: DC | PRN

## 2014-04-13 NOTE — Progress Notes (Signed)
Subjective:    Patient ID: Kristine [name], female    DOB: 07-08-1955, 58 y.o.   MRN: [id number]  HPI Here for health maintenance exam and to review chronic medical problems    Feeling pretty good overall   Is having some sinus problems - 2-3 weeks  Started with congestion  Now has pain in her sinuses , and pretty tired Post nasal drip at night  Mucous -not getting a lot out  Thinks she has sinusitis  Is using nasal saline spray  Leaving for a cruise soon - excited about that   Flu vaccine - had it in oct   Last mammogram was 2/15  Gyn exam - Dr Julien Girt - 2/15 and also had an ultrasound in aug  Has dense breasts and wants a 3D mammogram   Td 7/09 colonosc 1/13 - 5 year recall due to brother's hx of polyps   Family history-nothing new   Wt is down 3 lb with bmi of 32  BP Readings from Last 3 Encounters:  04/13/14 138/84  10/26/13 140/88  08/26/13 112/68  was 130/82 for biometric screening   She is taking care of herself  Exercise 4-5 days out of the week Tracking on a fit bit/steps     Walking at the mall - feels better when she does that  Sits too much at work - trying to make up for it   Some hip problems- going for massage Got an injection for bursitis   In June for biometric     Total chol 162  HDL 64 LDL 85 Trig 64 Fasting glucose 101  Then she stopped her simvastatin in Oct - was worried about memory issues , and she thinks it was causing cognitive issues from it    Flu vaccine  Mammogram 1/14 Self exam  Pap 1/14  Td 7/09  colonosc 1/13  Last labs in April   Office Visit on 08/26/2013  Component Date Value Ref Range Status  . Sodium 08/26/2013 139  135 - 145 mEq/L Final  . Potassium 08/26/2013 4.3  3.5 - 5.1 mEq/L Final  . Chloride 08/26/2013 104  96 - 112 mEq/L Final  . CO2 08/26/2013 27  19 - 32 mEq/L Final  . Glucose, Bld 08/26/2013 90  70 - 99 mg/dL Final  . BUN 08/26/2013 12  6 - 23 mg/dL Final  . Creatinine, Ser 08/26/2013  0.7  0.4 - 1.2 mg/dL Final  . Total Bilirubin 08/26/2013 0.7  0.3 - 1.2 mg/dL Final  . Alkaline Phosphatase 08/26/2013 62  39 - 117 U/L Final  . AST 08/26/2013 13  0 - 37 U/L Final  . ALT 08/26/2013 16  0 - 35 U/L Final  . Total Protein 08/26/2013 7.0  6.0 - 8.3 g/dL Final  . Albumin 08/26/2013 3.7  3.5 - 5.2 g/dL Final  . Calcium 08/26/2013 9.4  8.4 - 10.5 mg/dL Final  . GFR 08/26/2013 89.55  >60.00 mL/min Final  . Lipase 08/26/2013 57.0  11.0 - 59.0 U/L Final  . WBC 08/26/2013 5.7  4.5 - 10.5 K/uL Final  . RBC 08/26/2013 4.67  3.87 - 5.11 Mil/uL Final  . Hemoglobin 08/26/2013 13.1  12.0 - 15.0 g/dL Final  . HCT 08/26/2013 40.0  36.0 - 46.0 % Final  . MCV 08/26/2013 85.7  78.0 - 100.0 fl Final  . MCHC 08/26/2013 32.8  30.0 - 36.0 g/dL Final  . RDW 08/26/2013 13.1  11.5 - 14.6 % Final  . Platelets  08/26/2013 210.0  150.0 - 400.0 K/uL Final  . Neutrophils Relative % 08/26/2013 60.5  43.0 - 77.0 % Final  . Lymphocytes Relative 08/26/2013 27.3  12.0 - 46.0 % Final  . Monocytes Relative 08/26/2013 7.6  3.0 - 12.0 % Final  . Eosinophils Relative 08/26/2013 3.4  0.0 - 5.0 % Final  . Basophils Relative 08/26/2013 1.2  0.0 - 3.0 % Final  . Neutro Abs 08/26/2013 3.5  1.4 - 7.7 K/uL Final  . Lymphs Abs 08/26/2013 1.6  0.7 - 4.0 K/uL Final  . Monocytes Absolute 08/26/2013 0.4  0.1 - 1.0 K/uL Final  . Eosinophils Absolute 08/26/2013 0.2  0.0 - 0.7 K/uL Final  . Basophils Absolute 08/26/2013 0.1  0.0 - 0.1 K/uL Final      Review of Systems Review of Systems  Constitutional: Negative for fever, appetite change, fatigue and unexpected weight change.  Eyes: Negative for pain and visual disturbance.  ENt pos for sinus congestion and pain  Respiratory: Negative for cough and shortness of breath.   Cardiovascular: Negative for cp or palpitations    Gastrointestinal: Negative for nausea, diarrhea and constipation.  Genitourinary: Negative for urgency and frequency.  Skin: Negative for pallor or  rash   Neurological: Negative for weakness, light-headedness, numbness and headaches.  Hematological: Negative for adenopathy. Does not bruise/bleed easily.  Psychiatric/Behavioral: Negative for dysphoric mood. The patient is not nervous/anxious.         Objective:   Physical Exam  Constitutional: She appears well-developed and well-nourished. No distress.  obese and well appearing   HENT:  Head: Normocephalic and atraumatic.  Right Ear: External ear normal.  Left Ear: External ear normal.  Mouth/Throat: Oropharynx is clear and moist.  Nares are injected and congested  Mild maxillary sinus tenderness  Eyes: Conjunctivae and EOM are normal. Pupils are equal, round, and reactive to light. Right eye exhibits no discharge. Left eye exhibits no discharge. No scleral icterus.  Neck: Normal range of motion. Neck supple. No JVD present. No thyromegaly present.  Cardiovascular: Normal rate, regular rhythm, normal heart sounds and intact distal pulses.  Exam reveals no gallop.   Pulmonary/Chest: Effort normal and breath sounds normal. No respiratory distress. She has no wheezes. She has no rales.  Abdominal: Soft. Bowel sounds are normal. She exhibits no distension and no mass. There is no tenderness.  Musculoskeletal: She exhibits no edema or tenderness.  Lymphadenopathy:    She has no cervical adenopathy.  Neurological: She is alert. She has normal reflexes. No cranial nerve deficit. She exhibits normal muscle tone. Coordination normal.  Skin: Skin is warm and dry. No rash noted. No erythema. No pallor.  Psychiatric: She has a normal mood and affect.          Assessment & Plan:   Problem List Items Addressed This Visit      Respiratory   Sinus congestion    In pt prone to bacterial sinusitis  Px for levaquin given to hold - if symptoms worsen while out of town or if fever she will fill it and take it all  Continue nasal saline irrigation  Enc fluids       Other    HYPERCHOLESTEROLEMIA, PURE    Pt stopped simvastatin due to fear of memory loss/side eff  r echeck lab in April Expect lipids to be up  Disc goals for lipids and reasons to control them Rev labs with pt from last check  Rev low sat fat diet in detail  Obesity    Discussed how this problem influences overall health and the risks it imposes  Reviewed plan for weight loss with lower calorie diet (via better food choices and also portion control or program like weight watchers) and exercise building up to or more than 30 minutes 5 days per week including some aerobic activity   Enc her to keep up the good work    Routine general medical examination at a health care facility - Primary    Reviewed health habits including diet and exercise and skin cancer prevention Reviewed appropriate screening tests for age  Also reviewed health mt list, fam hx and immunization status , as well as social and family history   See HPI Goes to gyn for care and mammogram  Enc further diet/exercise for wt loss  Labs rev prev Next labs planned for April

## 2014-04-13 NOTE — Assessment & Plan Note (Signed)
Reviewed health habits including diet and exercise and skin cancer prevention Reviewed appropriate screening tests for age  Also reviewed health mt list, fam hx and immunization status , as well as social and family history   See HPI Goes to gyn for care and mammogram  Enc further diet/exercise for wt loss  Labs rev prev Next labs planned for April

## 2014-04-13 NOTE — Assessment & Plan Note (Signed)
In pt prone to bacterial sinusitis  Px for levaquin given to hold - if symptoms worsen while out of town or if fever she will fill it and take it all  Continue nasal saline irrigation  Enc fluids

## 2014-04-13 NOTE — Assessment & Plan Note (Signed)
Discussed how this problem influences overall health and the risks it imposes  Reviewed plan for weight loss with lower calorie diet (via better food choices and also portion control or program like weight watchers) and exercise building up to or more than 30 minutes 5 days per week including some aerobic activity   Enc her to keep up the good work

## 2014-04-13 NOTE — Patient Instructions (Signed)
Schedule fasting labs for April  Work on low cholesterol diet (Avoid red meat/ fried foods/ egg yolks/ fatty breakfast meats/ butter, cheese and high fat dairy/ and shellfish )    Fat and Cholesterol Control Diet Fat and cholesterol levels in your blood and organs are influenced by your diet. High levels of fat and cholesterol may lead to diseases of the heart, small and large blood vessels, gallbladder, liver, and pancreas. CONTROLLING FAT AND CHOLESTEROL WITH DIET Although exercise and lifestyle factors are important, your diet is key. That is because certain foods are known to raise cholesterol and others to lower it. The goal is to balance foods for their effect on cholesterol and more importantly, to replace saturated and trans fat with other types of fat, such as monounsaturated fat, polyunsaturated fat, and omega-3 fatty acids. On average, a person should consume no more than 15 to 17 g of saturated fat daily. Saturated and trans fats are considered "bad" fats, and they will raise LDL cholesterol. Saturated fats are primarily found in animal products such as meats, butter, and cream. However, that does not mean you need to give up all your favorite foods. Today, there are good tasting, low-fat, low-cholesterol substitutes for most of the things you like to eat. Choose low-fat or nonfat alternatives. Choose round or loin cuts of red meat. These types of cuts are lowest in fat and cholesterol. Chicken (without the skin), fish, veal, and ground [place] breast are great choices. Eliminate fatty meats, such as hot dogs and salami. Even shellfish have little or no saturated fat. Have a 3 oz (85 g) portion when you eat lean meat, poultry, or fish. Trans fats are also called "partially hydrogenated oils." They are oils that have been scientifically manipulated so that they are solid at room temperature resulting in a longer shelf life and improved taste and texture of foods in which they are added. Trans fats  are found in stick margarine, some tub margarines, cookies, crackers, and baked goods.  When baking and cooking, oils are a great substitute for butter. The monounsaturated oils are especially beneficial since it is believed they lower LDL and raise HDL. The oils you should avoid entirely are saturated tropical oils, such as coconut and palm.  Remember to eat a lot from food groups that are naturally free of saturated and trans fat, including fish, fruit, vegetables, beans, grains (barley, rice, couscous, bulgur wheat), and pasta (without cream sauces).  IDENTIFYING FOODS THAT LOWER FAT AND CHOLESTEROL  Soluble fiber may lower your cholesterol. This type of fiber is found in fruits such as apples, vegetables such as broccoli, potatoes, and carrots, legumes such as beans, peas, and lentils, and grains such as barley. Foods fortified with plant sterols (phytosterol) may also lower cholesterol. You should eat at least 2 g per day of these foods for a cholesterol lowering effect.  Read package labels to identify low-saturated fats, trans fat free, and low-fat foods at the supermarket. Select cheeses that have only 2 to 3 g saturated fat per ounce. Use a heart-healthy tub margarine that is free of trans fats or partially hydrogenated oil. When buying baked goods (cookies, crackers), avoid partially hydrogenated oils. Breads and muffins should be made from whole grains (whole-wheat or whole oat flour, instead of "flour" or "enriched flour"). Buy non-creamy canned soups with reduced salt and no added fats.  FOOD PREPARATION TECHNIQUES  Never deep-fry. If you must fry, either stir-fry, which uses very little fat, or use non-stick cooking  sprays. When possible, broil, bake, or roast meats, and steam vegetables. Instead of putting butter or margarine on vegetables, use lemon and herbs, applesauce, and cinnamon (for squash and sweet potatoes). Use nonfat yogurt, salsa, and low-fat dressings for salads.  LOW-SATURATED  FAT / LOW-FAT FOOD SUBSTITUTES Meats / Saturated Fat (g)  Avoid: Steak, marbled (3 oz/85 g) / 11 g  Choose: Steak, lean (3 oz/85 g) / 4 g  Avoid: Hamburger (3 oz/85 g) / 7 g  Choose: Hamburger, lean (3 oz/85 g) / 5 g  Avoid: Ham (3 oz/85 g) / 6 g  Choose: Ham, lean cut (3 oz/85 g) / 2.4 g  Avoid: Chicken, with skin, dark meat (3 oz/85 g) / 4 g  Choose: Chicken, skin removed, dark meat (3 oz/85 g) / 2 g  Avoid: Chicken, with skin, light meat (3 oz/85 g) / 2.5 g  Choose: Chicken, skin removed, light meat (3 oz/85 g) / 1 g Dairy / Saturated Fat (g)  Avoid: Whole milk (1 cup) / 5 g  Choose: Low-fat milk, 2% (1 cup) / 3 g  Choose: Low-fat milk, 1% (1 cup) / 1.5 g  Choose: Skim milk (1 cup) / 0.3 g  Avoid: Hard cheese (1 oz/28 g) / 6 g  Choose: Skim milk cheese (1 oz/28 g) / 2 to 3 g  Avoid: Cottage cheese, 4% fat (1 cup) / 6.5 g  Choose: Low-fat cottage cheese, 1% fat (1 cup) / 1.5 g  Avoid: Ice cream (1 cup) / 9 g  Choose: Sherbet (1 cup) / 2.5 g  Choose: Nonfat frozen yogurt (1 cup) / 0.3 g  Choose: Frozen fruit bar / trace  Avoid: Whipped cream (1 tbs) / 3.5 g  Choose: Nondairy whipped topping (1 tbs) / 1 g Condiments / Saturated Fat (g)  Avoid: Mayonnaise (1 tbs) / 2 g  Choose: Low-fat mayonnaise (1 tbs) / 1 g  Avoid: Butter (1 tbs) / 7 g  Choose: Extra light margarine (1 tbs) / 1 g  Avoid: Coconut oil (1 tbs) / 11.8 g  Choose: Olive oil (1 tbs) / 1.8 g  Choose: Corn oil (1 tbs) / 1.7 g  Choose: Safflower oil (1 tbs) / 1.2 g  Choose: Sunflower oil (1 tbs) / 1.4 g  Choose: Soybean oil (1 tbs) / 2.4 g  Choose: Canola oil (1 tbs) / 1 g Document Released: 04/29/2005 Document Revised: 08/24/2012 Document Reviewed: 07/28/2013 [employer] Patient Information 2015 [place], [place]. This information is not intended to replace advice given to you by your health care provider. Make sure you discuss any questions you have with your health care provider.

## 2014-04-13 NOTE — Progress Notes (Signed)
Pre visit review using our clinic review tool, if applicable. No additional management support is needed unless otherwise documented below in the visit note. 

## 2014-04-13 NOTE — Assessment & Plan Note (Signed)
Pt stopped simvastatin due to fear of memory loss/side eff  r echeck lab in April Expect lipids to be up  Disc goals for lipids and reasons to control them Rev labs with pt from last check  Rev low sat fat diet in detail

## 2014-06-30 ENCOUNTER — Ambulatory Visit (INDEPENDENT_AMBULATORY_CARE_PROVIDER_SITE_OTHER): Payer: BLUE CROSS/BLUE SHIELD | Admitting: Family Medicine

## 2014-06-30 ENCOUNTER — Encounter: Payer: Self-pay | Admitting: Family Medicine

## 2014-06-30 VITALS — BP 128/86 | HR 85 | Temp 98.4°F

## 2014-06-30 DIAGNOSIS — J0110 Acute frontal sinusitis, unspecified: Secondary | ICD-10-CM

## 2014-06-30 MED ORDER — AZITHROMYCIN 250 MG PO TABS
ORAL_TABLET | ORAL | Status: DC
Start: 2014-06-30 — End: 2014-08-10

## 2014-06-30 NOTE — Patient Instructions (Signed)
Start back on the flonase and use zirthromax in the meantime.  Try to get some rest.  Try to rest your voice. Glad to see you.

## 2014-06-30 NOTE — Progress Notes (Signed)
Pre visit review using our clinic review tool, if applicable. No additional management support is needed unless otherwise documented below in the visit note.  Sick since early 04/2014.  Didn't have to take prev levaquin rx.  No fevers, did have chills.  Sx worse recently, in the last ~1 week.  Voice has been hoarse.  Cough.  Stuffy.  Facial pain, frontal.  No vomiting, no diarrhea now.  Prev did have diarrhea and vertigo about 3 week ago, both resolved in the meantime.    She has eye clinic f/u recently after the episode of vertigo.  She had some transient floater like changes in the L eye, unclear if from atypical migraine.    Meds, vitals, and allergies reviewed.   ROS: See HPI.  Otherwise, noncontributory.  GEN: nad, alert and oriented HEENT: mucous membranes moist, tm w/o erythema, nasal exam w/o erythema, clear discharge noted,  OP with cobblestoning, frontal sinuses ttp  NECK: supple w/o LA CV: rrr.   PULM: ctab, no inc wob EXT: no edema SKIN: no acute rash

## 2014-07-01 DIAGNOSIS — J0110 Acute frontal sinusitis, unspecified: Secondary | ICD-10-CM | POA: Insufficient documentation

## 2014-07-01 NOTE — Assessment & Plan Note (Signed)
Nontoxic, can tolerate zmax, start zmax and use flonase, f/u prn.  She agrees.

## 2014-08-03 ENCOUNTER — Telehealth: Payer: Self-pay | Admitting: Family Medicine

## 2014-08-03 MED ORDER — OSELTAMIVIR PHOSPHATE 75 MG PO CAPS: 75.0000 mg | ORAL_CAPSULE | Freq: Two times a day (BID) | ORAL | Status: DC

## 2014-08-03 NOTE — Telephone Encounter (Signed)
Husband at [place] today.  He was flu positive.  Patient is sick at home with similar.  I am okay with rxing tamiflu for her to have.  She should be able to take med.  rx sent.   Routed to PCP as FYI.

## 2014-08-09 ENCOUNTER — Telehealth: Payer: Self-pay | Admitting: Family Medicine

## 2014-08-09 NOTE — Telephone Encounter (Signed)
[facility] Patient Name: [name] DOB: 1955/11/07 Initial Comment Caller states she has been treated for the flu. She has been having shortness of breath. Nurse Assessment Nurse: Donalynn Furlong, RN, Myna Hidalgo Date/Time Eilene Ghazi Time): 08/09/2014 11:52:52 AM Confirm and document reason for call. If symptomatic, describe symptoms. ---Caller states she has been treated for the flu. She has been having shortness of breath with exertion only, otherwise ok. Has taken Tamiflu. Has the patient traveled out of the country within the last 30 days? ---No Does the patient require triage? ---Yes Related visit to physician within the last 2 weeks? ---Yes Does the PT have any chronic conditions? (i.e. diabetes, asthma, etc.) ---Yes List chronic conditions. ---depression Guidelines Guideline Title Affirmed Question Affirmed Notes Breathing Difficulty [1] MODERATE longstanding difficulty breathing (e.g., speaks in phrases, SOB even at rest, pulse 100-120) AND [2] SAME as normal Final Disposition User See PCP When Office is Open (within 3 days) Donalynn Furlong, RN, Myna Hidalgo Comments pt called back with appt 3/30 at 9:45 w Dr Lorelei Pont

## 2014-08-09 NOTE — Telephone Encounter (Signed)
Sorry transposed day; date of appt is 08/10/14.

## 2014-08-09 NOTE — Telephone Encounter (Signed)
Please verify appointment date.

## 2014-08-09 NOTE — Telephone Encounter (Signed)
[facility] scheduled appt on 07/14/14 at 9:45 AM with Dr Lorelei Pont.

## 2014-08-10 ENCOUNTER — Encounter: Payer: Self-pay | Admitting: Family Medicine

## 2014-08-10 ENCOUNTER — Ambulatory Visit (INDEPENDENT_AMBULATORY_CARE_PROVIDER_SITE_OTHER): Payer: BLUE CROSS/BLUE SHIELD | Admitting: Family Medicine

## 2014-08-10 VITALS — BP 140/90 | HR 70 | Temp 98.6°F | Ht 65.0 in | Wt 190.5 lb

## 2014-08-10 DIAGNOSIS — J1189 Influenza due to unidentified influenza virus with other manifestations: Secondary | ICD-10-CM | POA: Diagnosis not present

## 2014-08-10 DIAGNOSIS — J111 Influenza due to unidentified influenza virus with other respiratory manifestations: Secondary | ICD-10-CM

## 2014-08-10 NOTE — Progress Notes (Signed)
Pre visit review using our clinic review tool, if applicable. No additional management support is needed unless otherwise documented below in the visit note. 

## 2014-08-10 NOTE — Progress Notes (Signed)
Dr. Frederico Hamman T. Wright Gravely, MD, [facility] [place], [address] Phone: [phone] Fax: [phone]  08/10/2014  Patient: Kristine [name], MRN: [id number], DOB: 11-26-1955, 59 y.o.  Primary Physician:  Loura Pardon, MD  Chief Complaint: Cough and Shortness of Breath  Subjective:   Kristine [name] presents with runny nose, sneezing, cough, sore throat, malaise, myalgias, arthralgia, chills, and fever.  F/u flu.  Husband sick last week, last Tuesday, then hit the wall. S/p Tamiflu  Non-smoker.   + recent exposure to others with similar symptoms.   The patent denies sore throat as the primary complaint. Denies sthortness of breath/wheezing, otalgia, facial pain, abdominal pain, changes in bowel or bladder.  Generally feels terrible  Tmax: ?  PMH, PHS, Allergies, Problem List, Medications, Family History, and Social History have all been reviewed.  ROS as above, eating and drinking - tolerating PO. Urinating normally. No excessive vomitting or diarrhea.   Objective:   Blood pressure 140/90, pulse 70, temperature 98.6 F (37 C), temperature source Oral, height 5\' 5"  (1.651 m), weight 190 lb 8 oz (86.41 kg), SpO2 97 %.  Gen: WDWN, NAD; A & O x3, cooperative. Pleasant.Globally Non-toxic HEENT: Normocephalic and atraumatic. Throat clear, w/o exudate, R TM clear, L TM - good landmarks, No fluid present. rhinnorhea. No frontal or maxillary sinus T. MMM NECK: Anterior cervical  LAD is absent CV: RRR, No M/G/R, cap refill <2 sec PULM: Breathing comfortably in no respiratory distress. no wheezing, crackles, rhonchi ABD: S,NT,ND,+BS. No HSM. No rebound. EXT: No c/c/e PSYCH: Friendly, good eye contact MSK: Nml gait   Assessment and Plan:   Influenza with respiratory manifestation other than pneumonia  The patient's clinical exam and history is consistent with a diagnosis of influenza. C/w conservative  care  Supportive care, fluids, cough medicines as needed, and anti-pyretics. Infection control emphasized, including OOW or school until AF 24 hours.  Signed,  Maud Deed. Orman Matsumura, MD   Patient's Medications  New Prescriptions   No medications on file  Previous Medications   ACETAMINOPHEN (TYLENOL) 500 MG TABLET    Take 500 mg by mouth every 6 (six) hours as needed.     ALPRAZOLAM (XANAX) 0.25 MG TABLET    Take 1 tablet (0.25 mg total) by mouth daily as needed.   ASPIRIN 81 MG TABLET    Take 81 mg by mouth daily.     BUPROPION (WELLBUTRIN XL) 150 MG 24 HR TABLET    Take 1 tablet (150 mg total) by mouth daily.   CYCLOBENZAPRINE (FLEXERIL) 10 MG TABLET    Take 0.5 tablets (5 mg total) by mouth 3 (three) times daily as needed for muscle spasms.   ESOMEPRAZOLE (NEXIUM) 40 MG CAPSULE    Take 40 mg by mouth daily as needed.   FEXOFENADINE (ALLEGRA) 180 MG TABLET    Take 180 mg by mouth daily as needed.    FLUTICASONE (FLONASE) 50 MCG/ACT NASAL SPRAY    PLACE 2 SPRAYS INTO THE NOSE DAILY.   KETOTIFEN FUMARATE (ALAWAY OP)    Place 1 drop into both eyes 2 (two) times daily.   LORATADINE (CLARITIN) 10 MG TABLET    Take 10 mg by mouth as needed for allergies.   RANITIDINE (ZANTAC) 150 MG TABLET    Take 150 mg by mouth 2 (two) times daily as needed.   Modified Medications   No medications on file  Discontinued Medications   AZITHROMYCIN (ZITHROMAX) 250  MG TABLET    2 tabs a day for 1 day and then 1 a day for 4 days.   OSELTAMIVIR (TAMIFLU) 75 MG CAPSULE    Take 1 capsule (75 mg total) by mouth 2 (two) times daily.

## 2014-08-17 ENCOUNTER — Other Ambulatory Visit: Payer: Self-pay | Admitting: Family Medicine

## 2014-09-05 ENCOUNTER — Other Ambulatory Visit: Payer: Self-pay | Admitting: Obstetrics and Gynecology

## 2014-09-06 LAB — CYTOLOGY - PAP

## 2014-10-17 ENCOUNTER — Encounter: Payer: Self-pay | Admitting: Family Medicine

## 2015-01-20 ENCOUNTER — Other Ambulatory Visit: Payer: Self-pay | Admitting: Family Medicine

## 2015-04-11 ENCOUNTER — Other Ambulatory Visit: Payer: Self-pay | Admitting: Family Medicine

## 2015-04-18 ENCOUNTER — Telehealth: Payer: Self-pay | Admitting: Family Medicine

## 2015-04-18 DIAGNOSIS — Z0000 Encounter for general adult medical examination without abnormal findings: Secondary | ICD-10-CM

## 2015-04-18 DIAGNOSIS — E7800 Pure hypercholesterolemia, unspecified: Secondary | ICD-10-CM

## 2015-04-18 NOTE — Telephone Encounter (Signed)
-----   Message from Ellamae Sia sent at 04/13/2015  2:28 PM EST ----- Regarding: Lab orders for Wednesday, 12.7.16 Lab orders for a f/u appt

## 2015-04-19 ENCOUNTER — Other Ambulatory Visit (INDEPENDENT_AMBULATORY_CARE_PROVIDER_SITE_OTHER): Payer: BLUE CROSS/BLUE SHIELD

## 2015-04-19 DIAGNOSIS — Z0000 Encounter for general adult medical examination without abnormal findings: Secondary | ICD-10-CM | POA: Diagnosis not present

## 2015-04-19 DIAGNOSIS — E7800 Pure hypercholesterolemia, unspecified: Secondary | ICD-10-CM

## 2015-04-19 LAB — CBC WITH DIFFERENTIAL/PLATELET
BASOS ABS: 0 10*3/uL (ref 0.0–0.1)
Basophils Relative: 0.9 % (ref 0.0–3.0)
EOS ABS: 0.2 10*3/uL (ref 0.0–0.7)
Eosinophils Relative: 4.1 % (ref 0.0–5.0)
HEMATOCRIT: 40.7 % (ref 36.0–46.0)
Hemoglobin: 13.4 g/dL (ref 12.0–15.0)
LYMPHS PCT: 25.7 % (ref 12.0–46.0)
Lymphs Abs: 1.4 10*3/uL (ref 0.7–4.0)
MCHC: 33 g/dL (ref 30.0–36.0)
MCV: 84.8 fl (ref 78.0–100.0)
MONOS PCT: 7.4 % (ref 3.0–12.0)
Monocytes Absolute: 0.4 10*3/uL (ref 0.1–1.0)
Neutro Abs: 3.5 10*3/uL (ref 1.4–7.7)
Neutrophils Relative %: 61.9 % (ref 43.0–77.0)
PLATELETS: 197 10*3/uL (ref 150.0–400.0)
RBC: 4.8 Mil/uL (ref 3.87–5.11)
RDW: 13.5 % (ref 11.5–15.5)
WBC: 5.6 10*3/uL (ref 4.0–10.5)

## 2015-04-19 LAB — COMPREHENSIVE METABOLIC PANEL
ALBUMIN: 3.9 g/dL (ref 3.5–5.2)
ALK PHOS: 67 U/L (ref 39–117)
ALT: 11 U/L (ref 0–35)
AST: 13 U/L (ref 0–37)
BILIRUBIN TOTAL: 0.4 mg/dL (ref 0.2–1.2)
BUN: 14 mg/dL (ref 6–23)
CALCIUM: 9.2 mg/dL (ref 8.4–10.5)
CO2: 27 mEq/L (ref 19–32)
CREATININE: 0.68 mg/dL (ref 0.40–1.20)
Chloride: 106 mEq/L (ref 96–112)
GFR: 93.6 mL/min (ref >60.00)
Glucose, Bld: 118 mg/dL — ABNORMAL HIGH (ref 70–99)
Potassium: 4.4 mEq/L (ref 3.5–5.1)
Sodium: 141 mEq/L (ref 135–145)
TOTAL PROTEIN: 6.3 g/dL (ref 6.0–8.3)

## 2015-04-19 LAB — LIPID PANEL
CHOL/HDL RATIO: 4
Cholesterol: 207 mg/dL — ABNORMAL HIGH (ref 0–200)
HDL: 56.5 mg/dL (ref >39.00)
LDL Cholesterol: 138 mg/dL — ABNORMAL HIGH (ref 0–99)
NONHDL: 150.04
TRIGLYCERIDES: 60 mg/dL (ref 0.0–149.0)
VLDL: 12 mg/dL (ref 0.0–40.0)

## 2015-04-19 LAB — TSH: TSH: 2.12 u[IU]/mL (ref 0.35–4.50)

## 2015-04-21 ENCOUNTER — Ambulatory Visit (INDEPENDENT_AMBULATORY_CARE_PROVIDER_SITE_OTHER): Payer: BLUE CROSS/BLUE SHIELD | Admitting: Family Medicine

## 2015-04-21 ENCOUNTER — Encounter: Payer: Self-pay | Admitting: Family Medicine

## 2015-04-21 VITALS — BP 145/90 | HR 73 | Temp 98.3°F | Ht 65.0 in | Wt 191.5 lb

## 2015-04-21 DIAGNOSIS — R030 Elevated blood-pressure reading, without diagnosis of hypertension: Secondary | ICD-10-CM

## 2015-04-21 DIAGNOSIS — F430 Acute stress reaction: Secondary | ICD-10-CM | POA: Insufficient documentation

## 2015-04-21 DIAGNOSIS — E7800 Pure hypercholesterolemia, unspecified: Secondary | ICD-10-CM

## 2015-04-21 DIAGNOSIS — IMO0001 Reserved for inherently not codable concepts without codable children: Secondary | ICD-10-CM

## 2015-04-21 DIAGNOSIS — R7303 Prediabetes: Secondary | ICD-10-CM | POA: Insufficient documentation

## 2015-04-21 DIAGNOSIS — E669 Obesity, unspecified: Secondary | ICD-10-CM

## 2015-04-21 DIAGNOSIS — I10 Essential (primary) hypertension: Secondary | ICD-10-CM | POA: Insufficient documentation

## 2015-04-21 DIAGNOSIS — R739 Hyperglycemia, unspecified: Secondary | ICD-10-CM | POA: Diagnosis not present

## 2015-04-21 MED ORDER — SIMVASTATIN 20 MG PO TABS: 20.0000 mg | ORAL_TABLET | Freq: Every day | ORAL | Status: DC

## 2015-04-21 NOTE — Progress Notes (Signed)
Pre visit review using our clinic review tool, if applicable. No additional management support is needed unless otherwise documented below in the visit note. 

## 2015-04-21 NOTE — Patient Instructions (Signed)
Start back on simvastatin  Drink lots of water/eat less sugar and carbs and get back to exercise when you can  Hopefully changing work situation will help  Keep an eye on your blood pressure   Follow up with labs prior in 3 months

## 2015-04-21 NOTE — Progress Notes (Signed)
Subjective:    Patient ID: Kristine [name], female    DOB: 08/05/1955, 59 y.o.   MRN: [id number]  HPI Here for f/u of chronic health problems   Very very stressed  Long work hours -needs to make a change  Thinks she needs to take some xanax - hardly ever takes (refilled a year ago) bp is up today -has been fine outside of the office   BP Readings from Last 3 Encounters:  05/19/15 144/84  04/21/15 145/90  08/10/14 140/90    Depression is fine   Wt is up 1 lb with bmi of 31 Sits all day - and too late to exercise   Labs Results for orders placed or performed in visit on 04/19/15  Comprehensive metabolic panel  Result Value Ref Range   Sodium 141 135 - 145 mEq/L   Potassium 4.4 3.5 - 5.1 mEq/L   Chloride 106 96 - 112 mEq/L   CO2 27 19 - 32 mEq/L   Glucose, Bld 118 (H) 70 - 99 mg/dL   BUN 14 6 - 23 mg/dL   Creatinine, Ser 0.68 0.40 - 1.20 mg/dL   Total Bilirubin 0.4 0.2 - 1.2 mg/dL   Alkaline Phosphatase 67 39 - 117 U/L   AST 13 0 - 37 U/L   ALT 11 0 - 35 U/L   Total Protein 6.3 6.0 - 8.3 g/dL   Albumin 3.9 3.5 - 5.2 g/dL   Calcium 9.2 8.4 - 10.5 mg/dL   GFR 93.60 >60.00 mL/min  CBC with Differential/Platelet  Result Value Ref Range   WBC 5.6 4.0 - 10.5 K/uL   RBC 4.80 3.87 - 5.11 Mil/uL   Hemoglobin 13.4 12.0 - 15.0 g/dL   HCT 40.7 36.0 - 46.0 %   MCV 84.8 78.0 - 100.0 fl   MCHC 33.0 30.0 - 36.0 g/dL   RDW 13.5 11.5 - 15.5 %   Platelets 197.0 150.0 - 400.0 K/uL   Neutrophils Relative % 61.9 43.0 - 77.0 %   Lymphocytes Relative 25.7 12.0 - 46.0 %   Monocytes Relative 7.4 3.0 - 12.0 %   Eosinophils Relative 4.1 0.0 - 5.0 %   Basophils Relative 0.9 0.0 - 3.0 %   Neutro Abs 3.5 1.4 - 7.7 K/uL   Lymphs Abs 1.4 0.7 - 4.0 K/uL   Monocytes Absolute 0.4 0.1 - 1.0 K/uL   Eosinophils Absolute 0.2 0.0 - 0.7 K/uL   Basophils Absolute 0.0 0.0 - 0.1 K/uL  Lipid panel  Result Value Ref Range   Cholesterol 207 (H) 0 - 200 mg/dL   Triglycerides 60.0 0.0 - 149.0 mg/dL    HDL 56.50 >39.00 mg/dL   VLDL 12.0 0.0 - 40.0 mg/dL   LDL Cholesterol 138 (H) 0 - 99 mg/dL   Total CHOL/HDL Ratio 4    NonHDL 150.04   TSH  Result Value Ref Range   TSH 2.12 0.35 - 4.50 uIU/mL    Glucose is 118  Was fasting  Family hx and obese -need to watch for DM    Lab Results  Component Value Date   CHOL 207* 04/19/2015   CHOL 141 11/23/2012   CHOL 135 10/15/2011   Lab Results  Component Value Date   HDL 56.50 04/19/2015   HDL 62.80 11/23/2012   HDL 62.30 10/15/2011   Lab Results  Component Value Date   LDLCALC 138* 04/19/2015   LDLCALC 64 11/23/2012   LDLCALC 60 10/15/2011   Lab Results  Component Value Date  TRIG 60.0 04/19/2015   TRIG 73.0 11/23/2012   TRIG 65.0 10/15/2011   Lab Results  Component Value Date   CHOLHDL 4 04/19/2015   CHOLHDL 2 11/23/2012   CHOLHDL 2 10/15/2011   Lab Results  Component Value Date   LDLDIRECT 146.1 08/19/2006    Was on medicine in the past and was much better -she went off of it Convenience eating - a lot of fatty  Would like LDL down to 130  She wants to start back on cholesterol med   Patient Active Problem List   Diagnosis Date Noted  . Hyperglycemia 04/21/2015  . Stress reaction 04/21/2015  . Elevated BP 04/21/2015  . Cough 10/27/2013  . Abdominal pain, other specified site 08/26/2013  . ETD (eustachian tube dysfunction) 06/21/2013  . Migraine with aura 06/21/2013  . Pelvic mass in female 09/01/2012  . Obesity 06/16/2011  . Rhinitis 06/14/2011  . Routine general medical examination at a health care facility 06/14/2011  . SCIATICA 05/22/2009  . GASTROESOPHAGEAL REFLUX DISEASE 04/14/2009  . CONGENITAL DEFICIENCY OF OTHER CLOTTING FACTORS 12/07/2007  . ANXIETY 11/25/2006  . DEPRESSION 11/25/2006  . PAT 11/25/2006  . ESOPHAGITIS 11/25/2006  . GASTRITIS 11/25/2006  . IBS 11/25/2006  . DVT, HX OF 11/25/2006  . MIGRAINES, HX OF 11/25/2006  . HYPERCHOLESTEROLEMIA, PURE 10/29/2006   Past Medical  History  Diagnosis Date  . DVT (deep venous thrombosis) ([place]) 1/08  . Anxiety   . Depression   . Factor V Leiden ([place])     On coag x 1 year ending in 09  . Basal cell carcinoma     face  . Basal cell cancer   . Radial head fracture 2011  . Gastritis 1990    hospital- abd pain  . Esophageal erosions   . GERD (gastroesophageal reflux disease)    Past Surgical History  Procedure Laterality Date  . Cholecystectomy    . Oophorectomy    . Shoulder surgery  04/2006  . Mohs surgery  7/11    basal cell carcinoma near eye  . Tonsillectomy  1976  . Colonoscopy    . Upper gastrointestinal endoscopy     Social History  Substance Use Topics  . Smoking status: Never Smoker   . Smokeless tobacco: Never Used  . Alcohol Use: 0.0 oz/week    0 Standard drinks or equivalent per week     Comment: occ   Family History  Problem Relation Age of Onset  . Heart attack Father     x 3   . Graves' disease Mother   . Depression Mother   . Osteoporosis Mother   . Breast cancer Sister   . Heart attack Brother     x 2  . Diabetes Brother   . Hypertension Brother   . Hypertension Sister   . Coronary artery disease Brother     with stents   . Atrial fibrillation Brother   . Colon cancer Neg Hx    Allergies  Allergen Reactions  . Amoxicillin-Pot Clavulanate     REACTION: GI  . Calcium     REACTION: muscle aches  . Erythromycin     REACTION: GI  . Guaifenesin     REACTION: GI  . Naproxen     REACTION: GI  . Oxycodone-Acetaminophen     REACTION: reaction not known  . Oxycodone-Aspirin     REACTION: reaction not known  . Sertraline Hcl     REACTION: hives   Current Outpatient Prescriptions on  File Prior to Visit  Medication Sig Dispense Refill  . acetaminophen (TYLENOL) 500 MG tablet Take 500 mg by mouth every 6 (six) hours as needed.      . ALPRAZolam (XANAX) 0.25 MG tablet Take 1 tablet (0.25 mg total) by mouth daily as needed. (Patient not taking: Reported on 05/19/2015) 20 tablet  0  . aspirin 81 MG tablet Take 81 mg by mouth daily.      Marland Kitchen buPROPion (WELLBUTRIN XL) 150 MG 24 hr tablet TAKE 1 TABLET (150 MG TOTAL) BY MOUTH DAILY. 90 tablet 3  . cyclobenzaprine (FLEXERIL) 10 MG tablet Take 0.5 tablets (5 mg total) by mouth 3 (three) times daily as needed for muscle spasms. (Patient not taking: Reported on 05/19/2015) 30 tablet 0  . fexofenadine (ALLEGRA) 180 MG tablet Take 180 mg by mouth daily as needed. Reported on 05/19/2015    . fluticasone (FLONASE) 50 MCG/ACT nasal spray PLACE 2 SPRAYS INTO THE NOSE DAILY. (Patient not taking: Reported on 05/19/2015) 16 g 2  . Ketotifen Fumarate (ALAWAY OP) Place 1 drop into both eyes 2 (two) times daily.    Marland Kitchen loratadine (CLARITIN) 10 MG tablet Take 10 mg by mouth as needed for allergies. Reported on 05/19/2015    . ranitidine (ZANTAC) 150 MG tablet Take 150 mg by mouth 2 (two) times daily as needed. Reported on 05/19/2015     No current facility-administered medications on file prior to visit.     Review of Systems Review of Systems  Constitutional: Negative for fever, appetite change,  and unexpected weight change.  Eyes: Negative for pain and visual disturbance.  Respiratory: Negative for cough and shortness of breath.   Cardiovascular: Negative for cp or palpitations    Gastrointestinal: Negative for nausea, diarrhea and constipation.  Genitourinary: Negative for urgency and frequency.  Skin: Negative for pallor or rash   Neurological: Negative for weakness, light-headedness, numbness and headaches.  Hematological: Negative for adenopathy. Does not bruise/bleed easily.  Psychiatric/Behavioral: Negative for dysphoric mood. The patient is not nervous/anxious.  pos for severe stressors causing exhaustion        Objective:   Physical Exam  Constitutional: She appears well-developed and well-nourished. No distress.  obese and well appearing   HENT:  Head: Normocephalic and atraumatic.  Mouth/Throat: Oropharynx is clear and moist.    Eyes: Conjunctivae and EOM are normal. Pupils are equal, round, and reactive to light.  Neck: Normal range of motion. Neck supple. No JVD present. Carotid bruit is not present. No thyromegaly present.  Cardiovascular: Normal rate, regular rhythm, normal heart sounds and intact distal pulses.  Exam reveals no gallop.   Pulmonary/Chest: Effort normal and breath sounds normal. No respiratory distress. She has no wheezes. She has no rales.  No crackles  Abdominal: Soft. Bowel sounds are normal. She exhibits no distension, no abdominal bruit and no mass. There is no tenderness.  Musculoskeletal: She exhibits no edema.  Lymphadenopathy:    She has no cervical adenopathy.  Neurological: She is alert. She has normal reflexes.  Skin: Skin is warm and dry. No rash noted.  Psychiatric: Thought content is not paranoid. She exhibits a depressed mood. She expresses no homicidal and no suicidal ideation.  Pt seems tired and is almost tearful at times when discussing her job situation     Assessment & Plan:   Problem List Items Addressed This Visit    HYPERCHOLESTEROLEMIA, PURE - Primary    Disc goals for lipids and reasons to control them Rev labs  with pt Rev low sat fat diet in detail  Pt choose to re start simvastatin  Will update if side effects  F/u in 3 mo for visit and labs       Relevant Medications   simvastatin (ZOCOR) 20 MG tablet   Other Relevant Orders   Comprehensive metabolic panel   Lipid panel   Obesity    Discussed how this problem influences overall health and the risks it imposes  Reviewed plan for weight loss with lower calorie diet (via better food choices and also portion control or program like weight watchers) and exercise building up to or more than 30 minutes 5 days per week including some aerobic activity   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 work harder on lifestyle change       Hyperglycemia    Disc low  glycemic diet and wt loss to prevent DM Fasting glucose 118  Check A1C at next f/u      Relevant Orders   Hemoglobin A1c   Stress reaction    Due to work environment - long hours have caused pt to be unable to care for herself She plans to resign soon  Reviewed stressors/ coping techniques/symptoms/ support sources/ tx options and side effects in detail today  Continue to follow       Elevated BP    Better on re check BP: (!) 145/90 mmHg    However still high  Pt will watch at home Not symptomatic  Consider tx if no imp in 3 mo with lifestyle change       Relevant Orders   Lipid panel      Kristine [name] Refreshed for Med List issue.

## 2015-04-23 NOTE — Assessment & Plan Note (Signed)
Disc goals for lipids and reasons to control them Rev labs with pt Rev low sat fat diet in detail  Pt choose to re start simvastatin  Will update if side effects  F/u in 3 mo for visit and labs

## 2015-04-23 NOTE — Assessment & Plan Note (Signed)
Due to work environment - long hours have caused pt to be unable to care for herself She plans to resign soon  Reviewed stressors/ coping techniques/symptoms/ support sources/ tx options and side effects in detail today  Continue to follow

## 2015-04-23 NOTE — Assessment & Plan Note (Signed)
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

## 2015-04-23 NOTE — Assessment & Plan Note (Signed)
Disc low glycemic diet and wt loss to prevent DM Fasting glucose 118  Check A1C at next f/u

## 2015-04-23 NOTE — Assessment & Plan Note (Signed)
Better on re check BP: (!) 145/90 mmHg    However still high  Pt will watch at home Not symptomatic  Consider tx if no imp in 3 mo with lifestyle change

## 2015-04-24 MED ORDER — ESOMEPRAZOLE MAGNESIUM 40 MG PO CPDR: 40.0000 mg | DELAYED_RELEASE_CAPSULE | Freq: Every day | ORAL | Status: DC

## 2015-05-19 ENCOUNTER — Encounter: Payer: Self-pay | Admitting: Primary Care

## 2015-05-19 ENCOUNTER — Ambulatory Visit (INDEPENDENT_AMBULATORY_CARE_PROVIDER_SITE_OTHER): Payer: BLUE CROSS/BLUE SHIELD | Admitting: Primary Care

## 2015-05-19 VITALS — BP 144/84 | HR 71 | Temp 97.5°F | Ht 65.0 in | Wt 194.8 lb

## 2015-05-19 DIAGNOSIS — R002 Palpitations: Secondary | ICD-10-CM | POA: Diagnosis not present

## 2015-05-19 LAB — BASIC METABOLIC PANEL
BUN: 13 mg/dL (ref 6–23)
CALCIUM: 9.4 mg/dL (ref 8.4–10.5)
CHLORIDE: 107 meq/L (ref 96–112)
CO2: 29 meq/L (ref 19–32)
CREATININE: 0.64 mg/dL (ref 0.40–1.20)
GFR: 100.36 mL/min (ref >60.00)
Glucose, Bld: 101 mg/dL — ABNORMAL HIGH (ref 70–99)
Potassium: 4.2 mEq/L (ref 3.5–5.1)
Sodium: 141 mEq/L (ref 135–145)

## 2015-05-19 NOTE — Progress Notes (Signed)
Subjective:    Patient ID: Kristine [name], female    DOB: 1955-10-15, 60 y.o.   MRN: [id number]  HPI  Kristine [name] is a 60 year old female who presents today with a chief complaint of palpitations. Her palpitations have been present intermittently for the past month. She's recently been experiencing increased stress at work and has been consuming more caffeine during the holidays. She's had no caffine since Tuesday this week and has noticed a slight improvement since. Her palpitations have been present regardless of activity. Denies chest pain, shortness of breath. She has a history of anxiety and feels well managed.   Review of Systems  Respiratory: Negative for shortness of breath.   Cardiovascular: Positive for palpitations. Negative for chest pain and leg swelling.  Neurological: Negative for headaches.  Psychiatric/Behavioral: The patient is not nervous/anxious.        Past Medical History  Diagnosis Date  . DVT (deep venous thrombosis) ([place]) 1/08  . Anxiety   . Depression   . Factor V Leiden ([place])     On coag x 1 year ending in 09  . Basal cell carcinoma     face  . Basal cell cancer   . Radial head fracture 2011  . Gastritis 1990    hospital- abd pain  . Esophageal erosions   . GERD (gastroesophageal reflux disease)     Social History   Social History  . Marital Status: Married    Spouse Name: N/A  . Number of Children: 1  . Years of Education: N/A   Occupational History  . Nurse     Social History Main Topics  . Smoking status: Never Smoker   . Smokeless tobacco: Never Used  . Alcohol Use: 0.0 oz/week    0 Standard drinks or equivalent per week     Comment: occ  . Drug Use: No  . Sexual Activity: Not on file   Other Topics Concern  . Not on file   Social History Narrative   Some walking for exercise         Hematology - Dr. Catarina Hartshorn - Dr. Latanya Maudlin   [facility]- [facility]- Dr. Rosana Berger    Past Surgical History  Procedure Laterality Date    . Cholecystectomy    . Oophorectomy    . Shoulder surgery  04/2006  . Mohs surgery  7/11    basal cell carcinoma near eye  . Tonsillectomy  1976  . Colonoscopy    . Upper gastrointestinal endoscopy      Family History  Problem Relation Age of Onset  . Heart attack Father     x 3   . Graves' disease Mother   . Depression Mother   . Osteoporosis Mother   . Breast cancer Sister   . Heart attack Brother     x 2  . Diabetes Brother   . Hypertension Brother   . Hypertension Sister   . Coronary artery disease Brother     with stents   . Atrial fibrillation Brother   . Colon cancer Neg Hx     Allergies  Allergen Reactions  . Amoxicillin-Pot Clavulanate     REACTION: GI  . Calcium     REACTION: muscle aches  . Erythromycin     REACTION: GI  . Guaifenesin     REACTION: GI  . Naproxen     REACTION: GI  . Oxycodone-Acetaminophen     REACTION: reaction not known  . Oxycodone-Aspirin  REACTION: reaction not known  . Sertraline Hcl     REACTION: hives    Current Outpatient Prescriptions on File Prior to Visit  Medication Sig Dispense Refill  . acetaminophen (TYLENOL) 500 MG tablet Take 500 mg by mouth every 6 (six) hours as needed.      Marland Kitchen aspirin 81 MG tablet Take 81 mg by mouth daily.      Marland Kitchen buPROPion (WELLBUTRIN XL) 150 MG 24 hr tablet TAKE 1 TABLET (150 MG TOTAL) BY MOUTH DAILY. 90 tablet 3  . esomeprazole (NEXIUM) 40 MG capsule Take 40 mg by mouth daily at 12 noon.    Marland Kitchen Ketotifen Fumarate (ALAWAY OP) Place 1 drop into both eyes 2 (two) times daily.    . simvastatin (ZOCOR) 20 MG tablet Take 1 tablet (20 mg total) by mouth daily. 90 tablet 3  . ALPRAZolam (XANAX) 0.25 MG tablet Take 1 tablet (0.25 mg total) by mouth daily as needed. (Patient not taking: Reported on 05/19/2015) 20 tablet 0  . cyclobenzaprine (FLEXERIL) 10 MG tablet Take 0.5 tablets (5 mg total) by mouth 3 (three) times daily as needed for muscle spasms. (Patient not taking: Reported on 05/19/2015) 30  tablet 0  . fexofenadine (ALLEGRA) 180 MG tablet Take 180 mg by mouth daily as needed. Reported on 05/19/2015    . fluticasone (FLONASE) 50 MCG/ACT nasal spray PLACE 2 SPRAYS INTO THE NOSE DAILY. (Patient not taking: Reported on 05/19/2015) 16 g 2  . loratadine (CLARITIN) 10 MG tablet Take 10 mg by mouth as needed for allergies. Reported on 05/19/2015    . ranitidine (ZANTAC) 150 MG tablet Take 150 mg by mouth 2 (two) times daily as needed. Reported on 05/19/2015     No current facility-administered medications on file prior to visit.    BP 144/84 mmHg  Pulse 71  Temp(Src) 97.5 F (36.4 C) (Oral)  Ht 5\' 5"  (1.651 m)  Wt 194 lb 12.8 oz (88.361 kg)  BMI 32.42 kg/m2  SpO2 99%    Objective:   Physical Exam  Constitutional: She appears well-nourished.  Cardiovascular: Normal rate and regular rhythm.   Murmur heard. Slight murmur noted to aortic side.   Pulmonary/Chest: Effort normal and breath sounds normal.  Skin: Skin is warm and dry.  Psychiatric: She has a normal mood and affect.          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 Present x 1 month, worse recent.  ECG normal. NSR rate of 72, no PVC/PAC, signs of acute MI. Did notice aortic murmur today in office. History of years ago.  Suspect this is due to increased stress at work and recent increase in caffeine intake. Will send to cardiology for Holter monitor evaluation and possibly for echo. Return precautions provided.

## 2015-05-19 NOTE — Progress Notes (Signed)
Pre visit review using our clinic review tool, if applicable. No additional management support is needed unless otherwise documented below in the visit note. 

## 2015-05-19 NOTE — Patient Instructions (Addendum)
Your ECG was normal.  Complete lab work prior to leaving today. I will notify you of your results once received.   Stop by the front and speak with Rosaria Ferries regarding your referral to cardiology.  It was a pleasure meeting you!

## 2015-05-22 ENCOUNTER — Other Ambulatory Visit: Payer: Self-pay | Admitting: Primary Care

## 2015-05-22 DIAGNOSIS — R011 Cardiac murmur, unspecified: Secondary | ICD-10-CM

## 2015-05-23 ENCOUNTER — Ambulatory Visit: Payer: BLUE CROSS/BLUE SHIELD | Admitting: Family Medicine

## 2015-05-31 ENCOUNTER — Other Ambulatory Visit: Payer: BLUE CROSS/BLUE SHIELD

## 2015-06-01 ENCOUNTER — Other Ambulatory Visit: Payer: Self-pay

## 2015-06-01 ENCOUNTER — Ambulatory Visit (INDEPENDENT_AMBULATORY_CARE_PROVIDER_SITE_OTHER): Payer: BLUE CROSS/BLUE SHIELD

## 2015-06-01 DIAGNOSIS — R011 Cardiac murmur, unspecified: Secondary | ICD-10-CM

## 2015-06-07 ENCOUNTER — Encounter: Payer: Self-pay | Admitting: *Deleted

## 2015-06-19 ENCOUNTER — Ambulatory Visit (INDEPENDENT_AMBULATORY_CARE_PROVIDER_SITE_OTHER): Payer: BLUE CROSS/BLUE SHIELD | Admitting: Internal Medicine

## 2015-06-19 ENCOUNTER — Encounter: Payer: Self-pay | Admitting: Internal Medicine

## 2015-06-19 VITALS — BP 140/80 | HR 80 | Ht 65.0 in | Wt 192.0 lb

## 2015-06-19 DIAGNOSIS — R002 Palpitations: Secondary | ICD-10-CM

## 2015-06-19 NOTE — Progress Notes (Signed)
Cardiology Office Note   Date:  06/19/2015   ID:  Macala, Prokosch Nov 27, 1955, MRN [id number]  PCP:  Loura Pardon, MD  Cardiologist:   Dorris Carnes, MD   Pt referred by Dr Marliss Coots office for eval of palpitations     History of Present Illness: Kristine [name] is a 60 y.o. female with a history of palpitations  Referred by Naomie Dean, NP   Pt was seen in Jan  Says palpitations have occurred intermitt for past couple months  She has cut back on caffeine  Sl improvement since then  Echo done  LVEF was normal  Mild MR  RV normal   Patient says that she thinks spells are PVCs  Feels flip  Coughs with it Isolated  Sometimes says she has bigeminy, trigeminy    Had some SVT in past  Years ago, ? 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 acetaminophen (TYLENOL) 500 MG tablet Take 500 mg by mouth every 6 (six) hours as needed.      . ALPRAZolam (XANAX) 0.25 MG tablet Take 0.25 mg by mouth at bedtime as needed for anxiety.    Marland Kitchen aspirin 81 MG tablet Take 81 mg by mouth daily.      Marland Kitchen buPROPion (WELLBUTRIN XL) 150 MG 24 hr tablet TAKE 1 TABLET (150 MG TOTAL) BY MOUTH DAILY. 90 tablet 3  . cyclobenzaprine (FLEXERIL) 10 MG tablet Take 0.5 tablets (5 mg total) by mouth 3 (three) times daily as needed for muscle spasms. 30 tablet 0  . esomeprazole (NEXIUM) 40 MG capsule Take 40 mg by mouth daily at 12 noon. HEARTBURN    . fexofenadine (ALLEGRA) 180 MG tablet Take 180 mg by mouth daily as needed. Reported on 05/19/2015    . fluticasone (FLONASE) 50 MCG/ACT nasal spray PLACE 2 SPRAYS INTO THE NOSE DAILY. 16 g 2  . loratadine (CLARITIN) 10 MG tablet Take 10 mg by mouth as needed for allergies. Reported on 05/19/2015    . ranitidine (ZANTAC) 150 MG tablet  Take 150 mg by mouth 2 (two) times daily as needed for heartburn. Reported on 05/19/2015    . simvastatin (ZOCOR) 20 MG tablet Take 1 tablet (20 mg total) by mouth daily. 90 tablet 3   No current facility-administered medications for this visit.    Allergies:   Amoxicillin-pot clavulanate; Calcium; Erythromycin; Guaifenesin; Naproxen; Oxycodone-acetaminophen; Oxycodone-acetaminophen; Oxycodone-aspirin; Sertraline; and Sertraline hcl   Past Medical History  Diagnosis Date  . DVT (deep venous thrombosis) ([place]) 1/08  . Anxiety   . Depression   . Factor V Leiden ([place])     On coag x 1 year ending in 09  . Basal cell carcinoma     face  . Basal cell cancer   . Radial head fracture 2011  . Gastritis 1990    hospital- abd pain  . Esophageal erosions   . GERD (gastroesophageal reflux disease)     Past Surgical History  Procedure Laterality Date  . Cholecystectomy    . Oophorectomy    . Shoulder surgery  04/2006  . Mohs surgery  7/11    basal cell carcinoma near eye  . Tonsillectomy  1976  . Colonoscopy    .  Upper gastrointestinal endoscopy       Social History:  The patient  reports that she has never smoked. She has never used smokeless tobacco. She reports that she drinks alcohol. She reports that she does not use illicit drugs.   Family History:  The patient's family history includes Atrial fibrillation in her brother; Breast cancer in her sister; Coronary artery disease in her brother; Depression in her mother; Diabetes in her brother; Berenice Primas' disease in her mother; Heart attack in her brother and father; Hypertension in her brother and sister; Osteoporosis in her mother. There is no history of Colon cancer.    ROS:  Please see the history of present illness. All other systems are reviewed and  Negative to the above problem except as noted.    PHYSICAL EXAM: VS:  BP 140/80 mmHg  Pulse 80  Ht 5\' 5"  (1.651 m)  Wt 192 lb (87.091 kg)  BMI 31.95 kg/m2  GEN: Well nourished,  well developed, in no acute distress HEENT: normal Neck: no JVD, carotid bruits, or masses Cardiac: RRR; no murmurs, rubs, or gallops,no edema  Respiratory:  clear to auscultation bilaterally, normal work of breathing GI: soft, nontender, nondistended, + BS  No hepatomegaly  MS: no deformity Moving all extremities   Skin: warm and dry, no rash Neuro:  Strength and sensation are intact Psych: euthymic mood, full affect   EKG:  EKG is not ordered today.  On 05/19/15 SR 73 bpm     Lipid Panel    Component Value Date/Time   CHOL 207* 04/19/2015 0808   TRIG 60.0 04/19/2015 0808   HDL 56.50 04/19/2015 0808   CHOLHDL 4 04/19/2015 0808   VLDL 12.0 04/19/2015 0808   LDLCALC 138* 04/19/2015 0808   LDLDIRECT 146.1 08/19/2006 0935      Wt Readings from Last 3 Encounters:  06/19/15 192 lb (87.091 kg)  05/19/15 194 lb 12.8 oz (88.361 kg)  04/21/15 191 lb 8 oz (86.864 kg)      ASSESSMENT AND PLAN:  1  Palpitatioms / SOB  Sound like isolated skips  Not clear whether from atria or ventricles  Not hemodyn destabilizing   Does admit to some SOB Echo is normal   Would recomm Stress myoview to see if inducible and to r/o ischemia   Possible event vs b blocker if myoview is negative  Contiue to stay active  Avoid stimulants    Labs/ tests ordered today include:  Orders Placed This Encounter  Procedures  . Myocardial Perfusion Imaging     Disposition:   FU with me will be based on test results.    Signed, Dorris Carnes, MD  06/19/2015 9:52 PM    [facility] [place], [place], [place]  [address] Phone: [phone]; Fax: [phone]

## 2015-06-19 NOTE — Patient Instructions (Signed)
Your physician recommends that you continue on your current medications as directed. Please refer to the Current Medication list given to you today. Your physician has requested that you have a lexiscan myoview. For further information please visit [email or web address]. Please follow instruction sheet, as given.  Follow up with your physician will depend on test results.  

## 2015-06-27 ENCOUNTER — Telehealth (HOSPITAL_COMMUNITY): Payer: Self-pay | Admitting: *Deleted

## 2015-06-27 NOTE — Telephone Encounter (Signed)
Patient given detailed instructions per Myocardial Perfusion Study Information Sheet for the test on 06/29/15 at 0945. Patient notified to arrive 15 minutes early and that it is imperative to arrive on time for appointment to keep from having the test rescheduled.  If you need to cancel or reschedule your appointment, please call the office within 24 hours of your appointment. Failure to do so may result in a cancellation of your appointment, and a $50 no show fee. Patient verbalized understanding.Audi Conover W    

## 2015-06-28 LAB — HM MAMMOGRAPHY: HM Mammogram: NORMAL

## 2015-06-29 ENCOUNTER — Telehealth: Payer: Self-pay | Admitting: *Deleted

## 2015-06-29 ENCOUNTER — Ambulatory Visit (HOSPITAL_COMMUNITY): Payer: BLUE CROSS/BLUE SHIELD | Attending: Internal Medicine

## 2015-06-29 DIAGNOSIS — R0609 Other forms of dyspnea: Secondary | ICD-10-CM | POA: Diagnosis not present

## 2015-06-29 DIAGNOSIS — R002 Palpitations: Secondary | ICD-10-CM | POA: Diagnosis not present

## 2015-06-29 DIAGNOSIS — R0602 Shortness of breath: Secondary | ICD-10-CM | POA: Diagnosis not present

## 2015-06-29 LAB — MYOCARDIAL PERFUSION IMAGING
CHL RATE OF PERCEIVED EXERTION: 18
CSEPED: 6 min
CSEPEW: 7 METS
CSEPHR: 94 %
CSEPPHR: 151 {beats}/min
Exercise duration (sec): 0 s
LHR: 0.36
LV dias vol: 80 mL
LVSYSVOL: 29 mL
MPHR: 160 {beats}/min
Rest HR: 65 {beats}/min
SDS: 3
SRS: 4
SSS: 7
TID: 0.97

## 2015-06-29 MED ORDER — METOPROLOL SUCCINATE ER 25 MG PO TB24: 25.0000 mg | ORAL_TABLET | Freq: Every day | ORAL | Status: DC

## 2015-06-29 MED ORDER — TECHNETIUM TC 99M SESTAMIBI GENERIC - CARDIOLITE
10.8000 | Freq: Once | INTRAVENOUS | Status: AC | PRN
  Administered 2015-06-29: 11 via INTRAVENOUS

## 2015-06-29 MED ORDER — TECHNETIUM TC 99M SESTAMIBI GENERIC - CARDIOLITE
32.7000 | Freq: Once | INTRAVENOUS | Status: AC | PRN
  Administered 2015-06-29: 32.7 via INTRAVENOUS

## 2015-06-29 NOTE — Telephone Encounter (Signed)
Patient informed. She will start Toprol XL 25 mg daily F/u appointment made.

## 2015-06-29 NOTE — Progress Notes (Signed)
Patient would like to make sure Dr. Harrington Challenger is aware that when she had her lab work check in December with Dr. Glori Bickers she had been off her statin for about a year. She has restarted it around the middle of December and has follow up lab appointment in March to recheck lipids at that time.

## 2015-06-29 NOTE — Telephone Encounter (Signed)
-----   Message from Dorris Carnes V, MD sent at 06/29/2015  3:05 PM EST ----- Stess test is normal  DId have hypertensive response to exercise Would try low dose b blocker to see how feels  Toprol XL 25   Set f/u for 2 months in clinic

## 2015-07-04 ENCOUNTER — Telehealth: Payer: Self-pay

## 2015-07-04 NOTE — Telephone Encounter (Signed)
Pt is scheduled for 07/19/15 for f/u labs; pt wants to know what labs are to be done that day; pt had BMP on 05/19/15. Pt request cb.

## 2015-07-04 NOTE — Telephone Encounter (Signed)
Looks like A1C, CMET and Lipid profile

## 2015-07-05 NOTE — Telephone Encounter (Signed)
Patient is aware 

## 2015-07-18 ENCOUNTER — Other Ambulatory Visit: Payer: BLUE CROSS/BLUE SHIELD

## 2015-07-19 ENCOUNTER — Other Ambulatory Visit (INDEPENDENT_AMBULATORY_CARE_PROVIDER_SITE_OTHER): Payer: BLUE CROSS/BLUE SHIELD

## 2015-07-19 DIAGNOSIS — R030 Elevated blood-pressure reading, without diagnosis of hypertension: Secondary | ICD-10-CM | POA: Diagnosis not present

## 2015-07-19 DIAGNOSIS — R739 Hyperglycemia, unspecified: Secondary | ICD-10-CM | POA: Diagnosis not present

## 2015-07-19 DIAGNOSIS — E7800 Pure hypercholesterolemia, unspecified: Secondary | ICD-10-CM | POA: Diagnosis not present

## 2015-07-19 DIAGNOSIS — IMO0001 Reserved for inherently not codable concepts without codable children: Secondary | ICD-10-CM

## 2015-07-19 LAB — COMPREHENSIVE METABOLIC PANEL
ALBUMIN: 4 g/dL (ref 3.5–5.2)
ALK PHOS: 65 U/L (ref 39–117)
ALT: 13 U/L (ref 0–35)
AST: 14 U/L (ref 0–37)
BUN: 14 mg/dL (ref 6–23)
CALCIUM: 9.1 mg/dL (ref 8.4–10.5)
CO2: 29 mEq/L (ref 19–32)
Chloride: 107 mEq/L (ref 96–112)
Creatinine, Ser: 0.69 mg/dL (ref 0.40–1.20)
GFR: 91.96 mL/min (ref >60.00)
Glucose, Bld: 104 mg/dL — ABNORMAL HIGH (ref 70–99)
POTASSIUM: 4.3 meq/L (ref 3.5–5.1)
SODIUM: 140 meq/L (ref 135–145)
TOTAL PROTEIN: 6.2 g/dL (ref 6.0–8.3)
Total Bilirubin: 0.6 mg/dL (ref 0.2–1.2)

## 2015-07-19 LAB — LIPID PANEL
CHOLESTEROL: 143 mg/dL (ref 0–200)
HDL: 57.8 mg/dL (ref >39.00)
LDL Cholesterol: 70 mg/dL (ref 0–99)
NonHDL: 84.92
Total CHOL/HDL Ratio: 2
Triglycerides: 77 mg/dL (ref 0.0–149.0)
VLDL: 15.4 mg/dL (ref 0.0–40.0)

## 2015-07-19 LAB — HEMOGLOBIN A1C: Hgb A1c MFr Bld: 5.6 % (ref 4.6–6.5)

## 2015-07-21 ENCOUNTER — Ambulatory Visit (INDEPENDENT_AMBULATORY_CARE_PROVIDER_SITE_OTHER): Payer: BLUE CROSS/BLUE SHIELD | Admitting: Family Medicine

## 2015-07-21 ENCOUNTER — Encounter: Payer: Self-pay | Admitting: Family Medicine

## 2015-07-21 VITALS — BP 128/80 | HR 68 | Temp 98.4°F | Ht 65.0 in

## 2015-07-21 DIAGNOSIS — R739 Hyperglycemia, unspecified: Secondary | ICD-10-CM | POA: Diagnosis not present

## 2015-07-21 DIAGNOSIS — E669 Obesity, unspecified: Secondary | ICD-10-CM

## 2015-07-21 DIAGNOSIS — R030 Elevated blood-pressure reading, without diagnosis of hypertension: Secondary | ICD-10-CM

## 2015-07-21 DIAGNOSIS — IMO0001 Reserved for inherently not codable concepts without codable children: Secondary | ICD-10-CM

## 2015-07-21 DIAGNOSIS — E7800 Pure hypercholesterolemia, unspecified: Secondary | ICD-10-CM

## 2015-07-21 MED ORDER — FLUTICASONE PROPIONATE 50 MCG/ACT NA SUSP: NASAL | Status: DC

## 2015-07-21 MED ORDER — CYCLOBENZAPRINE HCL 10 MG PO TABS: 5.0000 mg | ORAL_TABLET | Freq: Three times a day (TID) | ORAL | Status: DC | PRN

## 2015-07-21 NOTE — Progress Notes (Signed)
Pre visit review using our clinic review tool, if applicable. No additional management support is needed unless otherwise documented below in the visit note. 

## 2015-07-21 NOTE — Progress Notes (Signed)
Subjective:    Patient ID: Kristine [name], female    DOB: 1955/09/11, 60 y.o.   MRN: [id number]  HPI Here for f/u of chronic medical problems   Doing ok overall   bp was elevated during recent stress myoview Was px metoprolol xl She is having side effects from this "kicking my rear end"- wants to try it a bit longer  Is avoiding caffeine No more palpitations  BP Readings from Last 3 Encounters:  07/21/15 138/86  06/19/15 140/80  05/19/15 144/84   BP: 128/80 mmHg  Better on re check   Was seen by cardiol for palpitations  Nl myoview and also echo-reasurring   Results for orders placed or performed in visit on 07/19/15  Hemoglobin A1c  Result Value Ref Range   Hgb A1c MFr Bld 5.6 4.6 - 6.5 %  Comprehensive metabolic panel  Result Value Ref Range   Sodium 140 135 - 145 mEq/L   Potassium 4.3 3.5 - 5.1 mEq/L   Chloride 107 96 - 112 mEq/L   CO2 29 19 - 32 mEq/L   Glucose, Bld 104 (H) 70 - 99 mg/dL   BUN 14 6 - 23 mg/dL   Creatinine, Ser 0.69 0.40 - 1.20 mg/dL   Total Bilirubin 0.6 0.2 - 1.2 mg/dL   Alkaline Phosphatase 65 39 - 117 U/L   AST 14 0 - 37 U/L   ALT 13 0 - 35 U/L   Total Protein 6.2 6.0 - 8.3 g/dL   Albumin 4.0 3.5 - 5.2 g/dL   Calcium 9.1 8.4 - 10.5 mg/dL   GFR 91.96 >60.00 mL/min  Lipid panel  Result Value Ref Range   Cholesterol 143 0 - 200 mg/dL   Triglycerides 77.0 0.0 - 149.0 mg/dL   HDL 57.80 >39.00 mg/dL   VLDL 15.4 0.0 - 40.0 mg/dL   LDL Cholesterol 70 0 - 99 mg/dL   Total CHOL/HDL Ratio 2    NonHDL 84.92      Hyperglycemia  Lab Results  Component Value Date   HGBA1C 5.6 07/19/2015   This is up from 5.5  Overall good   Back to exercise /yoga Very well controlled   Patient Active Problem List   Diagnosis Date Noted  . Hyperglycemia 04/21/2015  . Stress reaction 04/21/2015  . Elevated BP 04/21/2015  . Cough 10/27/2013  . Abdominal pain, other specified site 08/26/2013  . ETD (eustachian tube dysfunction) 06/21/2013  .  Migraine with aura 06/21/2013  . Pelvic mass in female 09/01/2012  . Obesity 06/16/2011  . Rhinitis 06/14/2011  . Routine general medical examination at a health care facility 06/14/2011  . SCIATICA 05/22/2009  . GASTROESOPHAGEAL REFLUX DISEASE 04/14/2009  . CONGENITAL DEFICIENCY OF OTHER CLOTTING FACTORS 12/07/2007  . ANXIETY 11/25/2006  . DEPRESSION 11/25/2006  . PAT 11/25/2006  . ESOPHAGITIS 11/25/2006  . GASTRITIS 11/25/2006  . IBS 11/25/2006  . DVT, HX OF 11/25/2006  . MIGRAINES, HX OF 11/25/2006  . HYPERCHOLESTEROLEMIA, PURE 10/29/2006   Past Medical History  Diagnosis Date  . DVT (deep venous thrombosis) ([place]) 1/08  . Anxiety   . Depression   . Factor V Leiden ([place])     On coag x 1 year ending in 09  . Basal cell carcinoma     face  . Basal cell cancer   . Radial head fracture 2011  . Gastritis 1990    hospital- abd pain  . Esophageal erosions   . GERD (gastroesophageal reflux disease)    Past  Surgical History  Procedure Laterality Date  . Cholecystectomy    . Oophorectomy    . Shoulder surgery  04/2006  . Mohs surgery  7/11    basal cell carcinoma near eye  . Tonsillectomy  1976  . Colonoscopy    . Upper gastrointestinal endoscopy     Social History  Substance Use Topics  . Smoking status: Never Smoker   . Smokeless tobacco: Never Used  . Alcohol Use: 0.0 oz/week    0 Standard drinks or equivalent per week     Comment: occ   Family History  Problem Relation Age of Onset  . Heart attack Father     x 3   . Graves' disease Mother   . Depression Mother   . Osteoporosis Mother   . Breast cancer Sister   . Heart attack Brother     x 2  . Diabetes Brother   . Hypertension Brother   . Hypertension Sister   . Coronary artery disease Brother     with stents   . Atrial fibrillation Brother   . Colon cancer Neg Hx    Allergies  Allergen Reactions  . Amoxicillin-Pot Clavulanate     REACTION: GI  . Calcium     REACTION: muscle aches  .  Erythromycin     REACTION: GI  . Guaifenesin     REACTION: GI  . Naproxen     REACTION: GI  . Oxycodone-Acetaminophen     REACTION: reaction not known  . Oxycodone-Acetaminophen Other (See Comments)    Other Reaction: Not Assessed  . Oxycodone-Aspirin     REACTION: reaction not known  . Sertraline Other (See Comments)    Other Reaction: Not Assessed  . Sertraline Hcl     REACTION: hives   Current Outpatient Prescriptions on File Prior to Visit  Medication Sig Dispense Refill  . acetaminophen (TYLENOL) 500 MG tablet Take 500 mg by mouth every 6 (six) hours as needed.      . ALPRAZolam (XANAX) 0.25 MG tablet Take 0.25 mg by mouth at bedtime as needed for anxiety.    Marland Kitchen aspirin 81 MG tablet Take 81 mg by mouth daily.      Marland Kitchen buPROPion (WELLBUTRIN XL) 150 MG 24 hr tablet TAKE 1 TABLET (150 MG TOTAL) BY MOUTH DAILY. 90 tablet 3  . esomeprazole (NEXIUM) 40 MG capsule Take 40 mg by mouth daily at 12 noon. HEARTBURN    . fexofenadine (ALLEGRA) 180 MG tablet Take 180 mg by mouth daily as needed. Reported on 05/19/2015    . loratadine (CLARITIN) 10 MG tablet Take 10 mg by mouth as needed for allergies. Reported on 05/19/2015    . metoprolol succinate (TOPROL XL) 25 MG 24 hr tablet Take 1 tablet (25 mg total) by mouth daily. 30 tablet 11  . ranitidine (ZANTAC) 150 MG tablet Take 150 mg by mouth 2 (two) times daily as needed for heartburn. Reported on 05/19/2015    . simvastatin (ZOCOR) 20 MG tablet Take 1 tablet (20 mg total) by mouth daily. 90 tablet 3   No current facility-administered medications on file prior to visit.     Review of Systems Review of Systems  Constitutional: Negative for fever, appetite change, fatigue and unexpected weight change.  Eyes: Negative for pain and visual disturbance.  Respiratory: Negative for cough and shortness of breath.   Cardiovascular: Negative for cp or palpitations   (palpitations are resolved) Gastrointestinal: Negative for nausea, diarrhea and  constipation.  Genitourinary: Negative  for urgency and frequency.  Skin: Negative for pallor or rash   Neurological: Negative for weakness, light-headedness, numbness and headaches.  Hematological: Negative for adenopathy. Does not bruise/bleed easily.  Psychiatric/Behavioral: Negative for dysphoric mood. The patient is not nervous/anxious.         Objective:   Physical Exam  Constitutional: She appears well-developed and well-nourished. No distress.  obese and well appearing   HENT:  Head: Normocephalic and atraumatic.  Mouth/Throat: Oropharynx is clear and moist.  Eyes: Conjunctivae and EOM are normal. Pupils are equal, round, and reactive to light.  Neck: Normal range of motion. Neck supple. No JVD present. Carotid bruit is not present. No thyromegaly present.  Cardiovascular: Normal rate, regular rhythm, normal heart sounds and intact distal pulses.  Exam reveals no gallop.   Pulmonary/Chest: Effort normal and breath sounds normal. No respiratory distress. She has no wheezes. She has no rales.  No crackles  Abdominal: Soft. Bowel sounds are normal. She exhibits no distension, no abdominal bruit and no mass. There is no tenderness.  Musculoskeletal: She exhibits no edema.  Lymphadenopathy:    She has no cervical adenopathy.  Neurological: She is alert. She has normal reflexes. No cranial nerve deficit. She exhibits normal muscle tone. Coordination normal.  Skin: Skin is warm and dry. No rash noted.  Psychiatric: She has a normal mood and affect.          Assessment & Plan:   Problem List Items Addressed This Visit      Other   Elevated BP    Improved on beta blocker (given for palpitations) Getting used to side eff Cardiology following  Enc wt loss/ DASH diet/good habits       HYPERCHOLESTEROLEMIA, PURE - Primary    Disc goals for lipids and reasons to control them Rev labs with pt Rev low sat fat diet in detail  Urged to continue zocor and diet        Hyperglycemia    Stable and well controlled with diet and exercise  Lab Results  Component Value Date   HGBA1C 5.6 07/19/2015   Enc wt loss       Obesity    Discussed how this problem influences overall health and the risks it imposes  Reviewed plan for weight loss with lower calorie diet (via better food choices and also portion control or program like weight watchers) and exercise building up to or more than 30 minutes 5 days per week including some aerobic activity

## 2015-07-21 NOTE — Patient Instructions (Addendum)
Blood pressure is great 128/80  Tough it out on the metoprolol the best you can  Keep up your fluids Also keep exercising Labs look good  If you are interested in a shingles/zoster vaccine - call your insurance to check on coverage,( you should not get it within 1 month of other vaccines) , then call us for a prescription  for it to take to a pharmacy that gives the shot , or make a nurse visit to get it here depending on your coverage

## 2015-07-23 NOTE — Assessment & Plan Note (Signed)
Stable and well controlled with diet and exercise  Lab Results  Component Value Date   HGBA1C 5.6 07/19/2015   Enc wt loss

## 2015-07-23 NOTE — Assessment & Plan Note (Signed)
Discussed how this problem influences overall health and the risks it imposes  Reviewed plan for weight loss with lower calorie diet (via better food choices and also portion control or program like weight watchers) and exercise building up to or more than 30 minutes 5 days per week including some aerobic activity    

## 2015-07-23 NOTE — Assessment & Plan Note (Signed)
Improved on beta blocker (given for palpitations) Getting used to side eff Cardiology following  Enc wt loss/ DASH diet/good habits

## 2015-07-23 NOTE — Assessment & Plan Note (Signed)
Disc goals for lipids and reasons to control them Rev labs with pt Rev low sat fat diet in detail  Urged to continue zocor and diet

## 2015-08-31 ENCOUNTER — Ambulatory Visit (INDEPENDENT_AMBULATORY_CARE_PROVIDER_SITE_OTHER): Payer: BLUE CROSS/BLUE SHIELD | Admitting: Internal Medicine

## 2015-08-31 ENCOUNTER — Encounter: Payer: Self-pay | Admitting: Internal Medicine

## 2015-08-31 VITALS — BP 142/80 | HR 63 | Ht 65.0 in | Wt 199.0 lb

## 2015-08-31 DIAGNOSIS — R002 Palpitations: Secondary | ICD-10-CM | POA: Diagnosis not present

## 2015-08-31 MED ORDER — METOPROLOL SUCCINATE ER 25 MG PO TB24: 25.0000 mg | ORAL_TABLET | Freq: Every day | ORAL | Status: DC

## 2015-08-31 NOTE — Progress Notes (Signed)
Cardiology Office Note   Date:  08/31/2015   ID:  Kristine [name], Kristine [name] May 07, 1956, MRN [id number]  PCP:  Loura Pardon, MD  Cardiologist:   Dorris Carnes, MD   F/U of palpitations      History of Present Illness: Kristine [name] is a 60 y.o. female with a history of palpitations  I saw her in Feb and Jan Echo normal  Stress myoview normal  Rx toprol XL   Since seen she has done well  She felt sluggish initially  Now OK [place] night felt skips for a few hours  Otherwise nothing      Outpatient Prescriptions Prior to Visit  Medication Sig Dispense Refill  . acetaminophen (TYLENOL) 500 MG tablet Take 500 mg by mouth every 6 (six) hours as needed.      . ALPRAZolam (XANAX) 0.25 MG tablet Take 0.25 mg by mouth at bedtime as needed for anxiety.    Marland Kitchen aspirin 81 MG tablet Take 81 mg by mouth daily.      Marland Kitchen buPROPion (WELLBUTRIN XL) 150 MG 24 hr tablet TAKE 1 TABLET (150 MG TOTAL) BY MOUTH DAILY. 90 tablet 3  . cyclobenzaprine (FLEXERIL) 10 MG tablet Take 0.5 tablets (5 mg total) by mouth 3 (three) times daily as needed for muscle spasms. 30 tablet 3  . esomeprazole (NEXIUM) 40 MG capsule Take 40 mg by mouth daily at 12 noon. HEARTBURN    . fexofenadine (ALLEGRA) 180 MG tablet Take 180 mg by mouth daily as needed. Reported on 05/19/2015    . fluticasone (FLONASE) 50 MCG/ACT nasal spray PLACE 2 SPRAYS INTO THE NOSE DAILY. 16 g 11  . loratadine (CLARITIN) 10 MG tablet Take 10 mg by mouth as needed for allergies. Reported on 05/19/2015    . metoprolol succinate (TOPROL XL) 25 MG 24 hr tablet Take 1 tablet (25 mg total) by mouth daily. 30 tablet 11  . ranitidine (ZANTAC) 150 MG tablet Take 150 mg by mouth 2 (two) times daily as needed for heartburn. Reported on 05/19/2015    . simvastatin (ZOCOR) 20 MG tablet Take 1 tablet (20 mg total) by mouth daily. 90 tablet 3   No facility-administered medications prior to visit.     Allergies:   Amoxicillin-pot clavulanate; Calcium; Erythromycin;  Guaifenesin; Naproxen; Oxycodone-acetaminophen; Oxycodone-acetaminophen; Oxycodone-aspirin; Sertraline; and Sertraline hcl   Past Medical History  Diagnosis Date  . DVT (deep venous thrombosis) ([place]) 1/08  . Anxiety   . Depression   . Factor V Leiden ([place])     On coag x 1 year ending in 09  . Basal cell carcinoma     face  . Basal cell cancer   . Radial head fracture 2011  . Gastritis 1990    hospital- abd pain  . Esophageal erosions   . GERD (gastroesophageal reflux disease)     Past Surgical History  Procedure Laterality Date  . Cholecystectomy    . Oophorectomy    . Shoulder surgery  04/2006  . Mohs surgery  7/11    basal cell carcinoma near eye  . Tonsillectomy  1976  . Colonoscopy    . Upper gastrointestinal endoscopy       Social History:  The patient  reports that she has never smoked. She has never used smokeless tobacco. She reports that she drinks alcohol. She reports that she does not use illicit drugs.   Family History:  The patient's family history includes Atrial fibrillation in her brother; Breast cancer in her  sister; Coronary artery disease in her brother; Depression in her mother; Diabetes in her brother; Berenice Primas' disease in her mother; Heart attack in her brother and father; Hypertension in her brother and sister; Osteoporosis in her mother. There is no history of Colon cancer.    ROS:  Please see the history of present illness. All other systems are reviewed and  Negative to the above problem except as noted.    PHYSICAL EXAM: VS:  BP 142/80 mmHg  Pulse 63  Ht 5\' 5"  (1.651 m)  Wt 199 lb (90.266 kg)  BMI 33.12 kg/m2  GEN: Well nourished, well developed, in no acute distress HEENT: normal Neck: no JVD, carotid bruits, or masses Cardiac: RRR; no murmurs, rubs, or gallops,no edema  Respiratory:  clear to auscultation bilaterally, normal work of breathing GI: soft, nontender, nondistended, + BS  No hepatomegaly  MS: no deformity Moving all extremities    Skin: warm and dry, no rash Neuro:  Strength and sensation are intact Psych: euthymic mood, full affect   EKG:  EKG is not ordered today.   Lipid Panel    Component Value Date/Time   CHOL 143 07/19/2015 0814   TRIG 77.0 07/19/2015 0814   HDL 57.80 07/19/2015 0814   CHOLHDL 2 07/19/2015 0814   VLDL 15.4 07/19/2015 0814   LDLCALC 70 07/19/2015 0814   LDLDIRECT 146.1 08/19/2006 0935      Wt Readings from Last 3 Encounters:  08/31/15 199 lb (90.266 kg)  06/29/15 192 lb (87.091 kg)  06/19/15 192 lb (87.091 kg)      ASSESSMENT AND PLAN:  1  Palpitations  Pt is doing well on Toprol  One breakthrough spell that lasted a few hours  (drank caffeine that day) Keep on current regimen  Watch caffeine and EtOH  Stay hydrated  Walk  F/U in December      Signed, Kristine Ross, MD  08/31/2015 8:55 AM    [address], [place], [place]  [address] Phone: [phone]; Fax: [phone]

## 2015-08-31 NOTE — Patient Instructions (Signed)
Your physician recommends that you continue on your current medications as directed. Please refer to the Current Medication list given to you today. Your physician wants you to follow-up in: December, 2017 WITH DR. Harrington Challenger.  You will receive a reminder letter in the mail two months in advance. If you don't receive a letter, please call our office to schedule the follow-up appointment.

## 2015-09-11 LAB — HM PAP SMEAR: HM Pap smear: NORMAL

## 2015-09-26 ENCOUNTER — Ambulatory Visit (INDEPENDENT_AMBULATORY_CARE_PROVIDER_SITE_OTHER): Payer: BLUE CROSS/BLUE SHIELD | Admitting: Internal Medicine

## 2015-09-26 ENCOUNTER — Encounter: Payer: Self-pay | Admitting: Internal Medicine

## 2015-09-26 VITALS — BP 132/90 | HR 65 | Temp 98.2°F | Resp 12

## 2015-09-26 DIAGNOSIS — J019 Acute sinusitis, unspecified: Secondary | ICD-10-CM | POA: Insufficient documentation

## 2015-09-26 DIAGNOSIS — J0100 Acute maxillary sinusitis, unspecified: Secondary | ICD-10-CM | POA: Diagnosis not present

## 2015-09-26 MED ORDER — CEFUROXIME AXETIL 250 MG PO TABS: 250.0000 mg | ORAL_TABLET | Freq: Two times a day (BID) | ORAL | Status: DC

## 2015-09-26 NOTE — Assessment & Plan Note (Signed)
Seems to have secondary bacterial infection Recommended analgesics and the fluticasone ceftin

## 2015-09-26 NOTE — Progress Notes (Signed)
Pre visit review using our clinic review tool, if applicable. No additional management support is needed unless otherwise documented below in the visit note. 

## 2015-09-26 NOTE — Progress Notes (Signed)
Subjective:    Patient ID: Kristine [name], female    DOB: May 15, 1955, 60 y.o.   MRN: [id number]  HPI Here due to respiratory illness  Has been sick for over a week Worse for past 5 days Not deep in chest --but coughing up grey/brown sputum Voice is gone--for 3-4 days  No fever that she is aware of Does have some sweats and chills though Some SOB with walking 3 days ago--not at rest Lots of head congestion and some post nasal drip Frontal headache--esp bad with cough Some right ear pain--and down right neck  Tried claritin, mucinex, saline for nose, flonase again  Current Outpatient Prescriptions on File Prior to Visit  Medication Sig Dispense Refill  . acetaminophen (TYLENOL) 500 MG tablet Take 500 mg by mouth every 6 (six) hours as needed.      . ALPRAZolam (XANAX) 0.25 MG tablet Take 0.25 mg by mouth at bedtime as needed for anxiety.    Marland Kitchen aspirin 81 MG tablet Take 81 mg by mouth daily.      Marland Kitchen buPROPion (WELLBUTRIN XL) 150 MG 24 hr tablet TAKE 1 TABLET (150 MG TOTAL) BY MOUTH DAILY. 90 tablet 3  . cyclobenzaprine (FLEXERIL) 10 MG tablet Take 0.5 tablets (5 mg total) by mouth 3 (three) times daily as needed for muscle spasms. 30 tablet 3  . esomeprazole (NEXIUM) 40 MG capsule Take 40 mg by mouth daily at 12 noon. HEARTBURN    . fexofenadine (ALLEGRA) 180 MG tablet Take 180 mg by mouth daily as needed. Reported on 05/19/2015    . fluticasone (FLONASE) 50 MCG/ACT nasal spray PLACE 2 SPRAYS INTO THE NOSE DAILY. 16 g 11  . loratadine (CLARITIN) 10 MG tablet Take 10 mg by mouth as needed for allergies. Reported on 05/19/2015    . metoprolol succinate (TOPROL XL) 25 MG 24 hr tablet Take 1 tablet (25 mg total) by mouth daily. 90 tablet 3  . ranitidine (ZANTAC) 150 MG tablet Take 150 mg by mouth 2 (two) times daily as needed for heartburn. Reported on 05/19/2015    . simvastatin (ZOCOR) 20 MG tablet Take 1 tablet (20 mg total) by mouth daily. 90 tablet 3   No current  facility-administered medications on file prior to visit.    Allergies  Allergen Reactions  . Amoxicillin-Pot Clavulanate     REACTION: GI  . Calcium     REACTION: muscle aches  . Erythromycin     REACTION: GI  . Guaifenesin     REACTION: GI  . Naproxen     REACTION: GI  . Oxycodone-Acetaminophen     REACTION: reaction not known  . Oxycodone-Acetaminophen Other (See Comments)    Other Reaction: Not Assessed  . Oxycodone-Aspirin     REACTION: reaction not known  . Sertraline Other (See Comments)    Other Reaction: Not Assessed  . Sertraline Hcl     REACTION: hives    Past Medical History  Diagnosis Date  . DVT (deep venous thrombosis) ([place]) 1/08  . Anxiety   . Depression   . Factor V Leiden ([employer] [facility])     On coag x 1 year ending in 09  . Basal cell carcinoma     face  . Basal cell cancer   . Radial head fracture 2011  . Gastritis 1990    hospital- abd pain  . Esophageal erosions   . GERD (gastroesophageal reflux disease)     Past Surgical History  Procedure Laterality Date  . Cholecystectomy    .  Oophorectomy    . Shoulder surgery  04/2006  . Mohs surgery  7/11    basal cell carcinoma near eye  . Tonsillectomy  1976  . Colonoscopy    . Upper gastrointestinal endoscopy      Family History  Problem Relation Age of Onset  . Heart attack Father     x 3   . Graves' disease Mother   . Depression Mother   . Osteoporosis Mother   . Breast cancer Sister   . Heart attack Brother     x 2  . Diabetes Brother   . Hypertension Brother   . Hypertension Sister   . Coronary artery disease Brother     with stents   . Atrial fibrillation Brother   . Colon cancer Neg Hx     Social History   Social History  . Marital Status: Married    Spouse Name: N/A  . Number of Children: 1  . Years of Education: N/A   Occupational History  . Nurse     Social History Main Topics  . Smoking status: Never Smoker   . Smokeless tobacco: Never Used  . Alcohol Use: 0.0  oz/week    0 Standard drinks or equivalent per week     Comment: occ  . Drug Use: No  . Sexual Activity: Not on file   Other Topics Concern  . Not on file   Social History Narrative   Some walking for exercise         Hematology - Dr. Catarina Hartshorn - Dr. Latanya Maudlin   [facility] Dr. Rosana Berger   Review of Systems  No rash No N/V. Some diarrhea week before this Appetite is just down a little bit     Objective:   Physical Exam  Constitutional: No distress.  freq coarse cough  HENT:  Mouth/Throat: Oropharynx is clear and moist. No oropharyngeal exudate.  Mild maxillary tenderness TMs normal  Moderate nasal inflammation  Neck: Normal range of motion. Neck supple.  Pulmonary/Chest: Effort normal and breath sounds normal. No respiratory distress. She has no wheezes. She has no rales.  Lymphadenopathy:    She has no cervical adenopathy.          Assessment & Plan:

## 2015-12-27 ENCOUNTER — Ambulatory Visit (INDEPENDENT_AMBULATORY_CARE_PROVIDER_SITE_OTHER): Payer: BLUE CROSS/BLUE SHIELD | Admitting: Family Medicine

## 2015-12-27 ENCOUNTER — Encounter: Payer: Self-pay | Admitting: Family Medicine

## 2015-12-27 VITALS — BP 130/80 | HR 58 | Temp 97.5°F | Ht 65.0 in | Wt 205.0 lb

## 2015-12-27 DIAGNOSIS — E7800 Pure hypercholesterolemia, unspecified: Secondary | ICD-10-CM | POA: Diagnosis not present

## 2015-12-27 DIAGNOSIS — Z1159 Encounter for screening for other viral diseases: Secondary | ICD-10-CM

## 2015-12-27 DIAGNOSIS — R739 Hyperglycemia, unspecified: Secondary | ICD-10-CM

## 2015-12-27 DIAGNOSIS — Z0000 Encounter for general adult medical examination without abnormal findings: Secondary | ICD-10-CM

## 2015-12-27 DIAGNOSIS — E669 Obesity, unspecified: Secondary | ICD-10-CM

## 2015-12-27 NOTE — Patient Instructions (Addendum)
If you are interested in a shingles/zoster vaccine - call your insurance to check on coverage,( you should not get it within 1 month of other vaccines) , then call us for a prescription  for it to take to a pharmacy that gives the shot , or make a nurse visit to get it here depending on your coverage Don't forget to get your flu shot at the fall   lab today for thyroid and Hep C screening   Keep working on healthy habits

## 2015-12-27 NOTE — Progress Notes (Signed)
Pre visit review using our clinic review tool, if applicable. No additional management support is needed unless otherwise documented below in the visit note. 

## 2015-12-27 NOTE — Progress Notes (Signed)
Subjective:    Patient ID: Kristine [name], female    DOB: 1956-01-04, 60 y.o.   MRN: [id number]  HPI  Here for health maintenance exam and to review chronic medical problems    Feeling pretty well lately except for flare up with her back  Went for deep tissue massage and using flexeril -improving  Not able to walk as much for 2.5 weeks  Never knows why it flares when it does    Wt Readings from Last 3 Encounters:  12/27/15 205 lb (93 kg)  08/31/15 199 lb (90.3 kg)  06/29/15 192 lb (87.1 kg)  walking for exercise up until recently - has a new fit bit to monitor  There is no time for self care  bmi is 34.1  Cannot figure out her wt gain  Ate asian food last night- retains fluid  She does try to watch carbs in general/ and more fresh vegetables    Pap per pt 5/17 with Dr Birdena Jubilee opth often for a macular pucker -watching that - affects vision in spells (uses a drop periodically that is anti inflammatory)  Mammogram 2/17 nl at gyn as well Self breast exam -no lumps or changes/ gyn also examined her   Colonoscopy 1/13 -nl with 10 y recall   Hep C/HIV screening -has had hiv testing after needle sticks in the past that were neg  Is interested in hep C screen   Zoster vaccine-is interested - will check on coverage   Flu shot- will get at work in the fall   Hx of hyperlipidemia Lab Results  Component Value Date   CHOL 143 07/19/2015   HDL 57.80 07/19/2015   LDLCALC 70 07/19/2015   LDLDIRECT 146.1 08/19/2006   TRIG 77.0 07/19/2015   CHOLHDL 2 07/19/2015  simvastatin and diet     Hx of hyperglycemia Lab Results  Component Value Date   HGBA1C 5.6 07/19/2015  watching her carbs - doing better   Mood- stable  On wellbutrin   Palpitations are better-on metoprolol   Still takes nexium - no longer needs it daily - will use zantac unless a bad flare Has not taken recently    Patient Active Problem List   Diagnosis Date Noted  . Need for hepatitis C  screening test 12/27/2015  . Acute non-recurrent maxillary sinusitis 09/26/2015  . Hyperglycemia 04/21/2015  . Stress reaction 04/21/2015  . Elevated BP 04/21/2015  . Cough 10/27/2013  . Abdominal pain, other specified site 08/26/2013  . ETD (eustachian tube dysfunction) 06/21/2013  . Migraine with aura 06/21/2013  . Pelvic mass in female 09/01/2012  . Obesity 06/16/2011  . Rhinitis 06/14/2011  . Routine general medical examination at a health care facility 06/14/2011  . SCIATICA 05/22/2009  . GASTROESOPHAGEAL REFLUX DISEASE 04/14/2009  . CONGENITAL DEFICIENCY OF OTHER CLOTTING FACTORS 12/07/2007  . ANXIETY 11/25/2006  . DEPRESSION 11/25/2006  . PAT 11/25/2006  . ESOPHAGITIS 11/25/2006  . IBS 11/25/2006  . DVT, HX OF 11/25/2006  . MIGRAINES, HX OF 11/25/2006  . HYPERCHOLESTEROLEMIA, PURE 10/29/2006   Past Medical History:  Diagnosis Date  . Anxiety   . Basal cell cancer   . Basal cell carcinoma    face  . Depression   . DVT (deep venous thrombosis) ([place]) 1/08  . Esophageal erosions   . Factor V Leiden ([place])    On coag x 1 year ending in 09  . Gastritis 1990   hospital- abd pain  . GERD (gastroesophageal  reflux disease)   . Radial head fracture 2011   Past Surgical History:  Procedure Laterality Date  . CHOLECYSTECTOMY    . COLONOSCOPY    . MOHS SURGERY  7/11   basal cell carcinoma near eye  . OOPHORECTOMY    . SHOULDER SURGERY  04/2006  . TONSILLECTOMY  1976  . UPPER GASTROINTESTINAL ENDOSCOPY     Social History  Substance Use Topics  . Smoking status: Never Smoker  . Smokeless tobacco: Never Used  . Alcohol use 0.0 oz/week     Comment: occ   Family History  Problem Relation Age of Onset  . Heart attack Father     x 3   . Graves' disease Mother   . Depression Mother   . Osteoporosis Mother   . Breast cancer Sister   . Heart attack Brother     x 2  . Diabetes Brother   . Hypertension Brother   . Hypertension Sister   . Coronary artery disease  Brother     with stents   . Atrial fibrillation Brother   . Colon cancer Neg Hx    Allergies  Allergen Reactions  . Amoxicillin-Pot Clavulanate     REACTION: GI  . Calcium     REACTION: muscle aches  . Erythromycin     REACTION: GI  . Guaifenesin     REACTION: GI  . Naproxen     REACTION: GI  . Oxycodone-Acetaminophen     REACTION: reaction not known  . Oxycodone-Acetaminophen Other (See Comments)    Other Reaction: Not Assessed  . Oxycodone-Aspirin     REACTION: reaction not known  . Sertraline Other (See Comments)    Other Reaction: Not Assessed  . Sertraline Hcl     REACTION: hives   Current Outpatient Prescriptions on File Prior to Visit  Medication Sig Dispense Refill  . acetaminophen (TYLENOL) 500 MG tablet Take 500 mg by mouth every 6 (six) hours as needed.      . ALPRAZolam (XANAX) 0.25 MG tablet Take 0.25 mg by mouth at bedtime as needed for anxiety.    Marland Kitchen aspirin 81 MG tablet Take 81 mg by mouth daily.      Marland Kitchen buPROPion (WELLBUTRIN XL) 150 MG 24 hr tablet TAKE 1 TABLET (150 MG TOTAL) BY MOUTH DAILY. 90 tablet 3  . cyclobenzaprine (FLEXERIL) 10 MG tablet Take 0.5 tablets (5 mg total) by mouth 3 (three) times daily as needed for muscle spasms. 30 tablet 3  . esomeprazole (NEXIUM) 40 MG capsule Take 40 mg by mouth daily at 12 noon. HEARTBURN    . fexofenadine (ALLEGRA) 180 MG tablet Take 180 mg by mouth daily as needed. Reported on 05/19/2015    . fluticasone (FLONASE) 50 MCG/ACT nasal spray PLACE 2 SPRAYS INTO THE NOSE DAILY. 16 g 11  . loratadine (CLARITIN) 10 MG tablet Take 10 mg by mouth as needed for allergies. Reported on 05/19/2015    . metoprolol succinate (TOPROL XL) 25 MG 24 hr tablet Take 1 tablet (25 mg total) by mouth daily. 90 tablet 3  . ranitidine (ZANTAC) 150 MG tablet Take 150 mg by mouth 2 (two) times daily as needed for heartburn. Reported on 05/19/2015    . simvastatin (ZOCOR) 20 MG tablet Take 1 tablet (20 mg total) by mouth daily. 90 tablet 3   No  current facility-administered medications on file prior to visit.     Review of Systems    Review of Systems  Constitutional: Negative for  fever, appetite change,  and unexpected weight change.  Eyes: Negative for pain and visual disturbance.  Respiratory: Negative for cough and shortness of breath.   Cardiovascular: Negative for cp or palpitations    Gastrointestinal: Negative for nausea, diarrhea and constipation.  Genitourinary: Negative for urgency and frequency.  Skin: Negative for pallor or rash   MSK pos for low back pain with sciatica  Neurological: Negative for weakness, light-headedness, numbness and headaches.  Hematological: Negative for adenopathy. Does not bruise/bleed easily.  Psychiatric/Behavioral: Negative for dysphoric mood. The patient is not nervous/anxious.      Objective:   Physical Exam  Constitutional: She appears well-developed and well-nourished. No distress.  obese and well appearing   HENT:  Head: Normocephalic and atraumatic.  Right Ear: External ear normal.  Left Ear: External ear normal.  Nose: Nose normal.  Mouth/Throat: Oropharynx is clear and moist.  Eyes: Conjunctivae and EOM are normal. Pupils are equal, round, and reactive to light. Right eye exhibits no discharge. Left eye exhibits no discharge. No scleral icterus.  Neck: Normal range of motion. Neck supple. No JVD present. Carotid bruit is not present. No thyromegaly present.  Cardiovascular: Normal rate, regular rhythm, normal heart sounds and intact distal pulses.  Exam reveals no gallop.   Pulmonary/Chest: Effort normal and breath sounds normal. No respiratory distress. She has no wheezes. She has no rales.  Abdominal: Soft. Bowel sounds are normal. She exhibits no distension and no mass. There is no tenderness.  Musculoskeletal: She exhibits no edema or tenderness.  Limited rom of LS due to back pain - gait is normal and no neuro deficits   Lymphadenopathy:    She has no cervical  adenopathy.  Neurological: She is alert. She has normal reflexes. No cranial nerve deficit. She exhibits normal muscle tone. Coordination normal.  Skin: Skin is warm and dry. No rash noted. No erythema. No pallor.  Solar lentigines diffusely  Psychiatric: She has a normal mood and affect.          Assessment & Plan:   Problem List Items Addressed This Visit      Other   Routine general medical examination at a health care facility - Primary    Reviewed health habits including diet and exercise and skin cancer prevention Reviewed appropriate screening tests for age  Also reviewed health mt list, fam hx and immunization status , as well as social and family history   See HPI Labs reviewed (most recent) and tsh and Hep c screen done today utd gyn care Disc plan for wt loss If you are interested in a shingles/zoster vaccine - call your insurance to check on coverage,( you should not get it within 1 month of other vaccines) , then call us for a prescription  for it to take to a pharmacy that gives the shot , or make a nurse visit to get it here depending on your coverage Don't forget to get your flu shot at the fall       Relevant Orders   TSH (Completed)   Obesity    Discussed how this problem influences overall health and the risks it imposes  Reviewed plan for weight loss with lower calorie diet (via better food choices and also portion control or program like weight watchers) and exercise building up to or more than 30 minutes 5 days per week including some aerobic activity   She is frustrated by wt  Needs to fit in more exercise  Disc options for eating-  perhaps DASH eating plan (avoiding processed food) or Mediterranean type eating with less carbs may help Big barrier is no time for self care       Need for hepatitis C screening test    Hep C ab screen with lab today      Relevant Orders   Hepatitis C antibody (Completed)   Hyperglycemia    Lab Results  Component  Value Date   HGBA1C 5.6 07/19/2015   Controlled with diet Disc low glycemic diet and wt loss to prevent DM2      HYPERCHOLESTEROLEMIA, PURE    Disc goals for lipids and reasons to control them Rev labs with pt Rev low sat fat diet in detail Continue statin and diet        Other Visit Diagnoses   None.

## 2015-12-28 LAB — TSH: TSH: 0.67 u[IU]/mL (ref 0.35–4.50)

## 2015-12-28 LAB — HEPATITIS C ANTIBODY: HCV AB: NEGATIVE

## 2015-12-28 NOTE — Assessment & Plan Note (Signed)
Discussed how this problem influences overall health and the risks it imposes  Reviewed plan for weight loss with lower calorie diet (via better food choices and also portion control or program like weight watchers) and exercise building up to or more than 30 minutes 5 days per week including some aerobic activity   She is frustrated by wt  Needs to fit in more exercise  Disc options for eating- perhaps DASH eating plan (avoiding processed food) or Mediterranean type eating with less carbs may help [place] barrier is no time for self care

## 2015-12-28 NOTE — Assessment & Plan Note (Signed)
Lab Results  Component Value Date   HGBA1C 5.6 07/19/2015   Controlled with diet Disc low glycemic diet and wt loss to prevent DM2

## 2015-12-28 NOTE — Assessment & Plan Note (Signed)
Hep C ab screen with lab today

## 2015-12-28 NOTE — Assessment & Plan Note (Signed)
Disc goals for lipids and reasons to control them Rev labs with pt Rev low sat fat diet in detail Continue statin and diet  

## 2015-12-28 NOTE — Assessment & Plan Note (Signed)
Reviewed health habits including diet and exercise and skin cancer prevention Reviewed appropriate screening tests for age  Also reviewed health mt list, fam hx and immunization status , as well as social and family history   See HPI Labs reviewed (most recent) and tsh and Hep c screen done today utd gyn care Disc plan for wt loss If you are interested in a shingles/zoster vaccine - call your insurance to check on coverage,( you should not get it within 1 month of other vaccines) , then call us for a prescription  for it to take to a pharmacy that gives the shot , or make a nurse visit to get it here depending on your coverage Don't forget to get your flu shot at the fall

## 2016-02-28 ENCOUNTER — Telehealth: Payer: Self-pay

## 2016-02-28 MED ORDER — ZOSTER VACCINE LIVE 19400 UNT/0.65ML ~~LOC~~ SUSR: 0.6500 mL | Freq: Once | SUBCUTANEOUS | 0 refills | Status: AC

## 2016-02-28 NOTE — Telephone Encounter (Signed)
Pt request rx for shingles vaccine sent to [facility]. Pt will ck with pharmacy tomorrow about scheduling immunization.

## 2016-02-28 NOTE — Telephone Encounter (Signed)
I sent it  

## 2016-02-29 NOTE — Telephone Encounter (Signed)
Pt notified Rx sent for vaccine

## 2016-02-29 NOTE — Telephone Encounter (Signed)
Pt not able to get vaccine at [facility], pt is scheduled for here 10/26  Pt aware

## 2016-03-07 ENCOUNTER — Ambulatory Visit (INDEPENDENT_AMBULATORY_CARE_PROVIDER_SITE_OTHER): Payer: BLUE CROSS/BLUE SHIELD

## 2016-03-07 DIAGNOSIS — Z23 Encounter for immunization: Secondary | ICD-10-CM | POA: Diagnosis not present

## 2016-03-07 DIAGNOSIS — Z2911 Encounter for prophylactic immunotherapy for respiratory syncytial virus (RSV): Secondary | ICD-10-CM

## 2016-04-08 ENCOUNTER — Ambulatory Visit (INDEPENDENT_AMBULATORY_CARE_PROVIDER_SITE_OTHER): Payer: BLUE CROSS/BLUE SHIELD | Admitting: Internal Medicine

## 2016-04-08 ENCOUNTER — Telehealth: Payer: Self-pay | Admitting: Family Medicine

## 2016-04-08 ENCOUNTER — Encounter: Payer: Self-pay | Admitting: Internal Medicine

## 2016-04-08 VITALS — BP 148/90 | HR 68 | Ht 65.0 in | Wt 192.0 lb

## 2016-04-08 DIAGNOSIS — I10 Essential (primary) hypertension: Secondary | ICD-10-CM

## 2016-04-08 DIAGNOSIS — R002 Palpitations: Secondary | ICD-10-CM | POA: Diagnosis not present

## 2016-04-08 MED ORDER — ACYCLOVIR 5 % EX OINT: 1.0000 "application " | TOPICAL_OINTMENT | CUTANEOUS | 3 refills | Status: DC

## 2016-04-08 NOTE — Telephone Encounter (Signed)
Sorry- I looked it up and can't figure out what viranex cream is (?)- and I do not see any old meds on her list that would work for fever blisters  Perhaps she means acyclovir ? I sent that to her pharmacy - if it is wrong she can tell them to put it back and clarify   Thanks

## 2016-04-08 NOTE — Progress Notes (Signed)
Cardiology Office Note   Date:  04/08/2016   ID:  Kristine [name] 1955-09-27, MRN [id number]  PCP:  Kristine Pardon, MD  Cardiologist:   Dorris Carnes, MD   Pt presents for f/u of palpitations     History of Present Illness: Kristine [name] is a 60 y.o. female with a history of palpitatoins  I saw her in April   Echo normal  Myoview normal  Rx toprol   Since seen she denies signif palpitations  Breathing is OK       Outpatient Medications Prior to Visit  Medication Sig Dispense Refill  . acetaminophen (TYLENOL) 500 MG tablet Take 500 mg by mouth every 6 (six) hours as needed.      . ALPRAZolam (XANAX) 0.25 MG tablet Take 0.25 mg by mouth at bedtime as needed for anxiety.    Marland Kitchen aspirin 81 MG tablet Take 81 mg by mouth daily.      Marland Kitchen buPROPion (WELLBUTRIN XL) 150 MG 24 hr tablet TAKE 1 TABLET (150 MG TOTAL) BY MOUTH DAILY. 90 tablet 3  . cyclobenzaprine (FLEXERIL) 10 MG tablet Take 0.5 tablets (5 mg total) by mouth 3 (three) times daily as needed for muscle spasms. 30 tablet 3  . esomeprazole (NEXIUM) 40 MG capsule Take 40 mg by mouth daily at 12 noon. HEARTBURN    . fexofenadine (ALLEGRA) 180 MG tablet Take 180 mg by mouth daily as needed. Reported on 05/19/2015    . fluticasone (FLONASE) 50 MCG/ACT nasal spray PLACE 2 SPRAYS INTO THE NOSE DAILY. 16 g 11  . loratadine (CLARITIN) 10 MG tablet Take 10 mg by mouth as needed for allergies. Reported on 05/19/2015    . metoprolol succinate (TOPROL XL) 25 MG 24 hr tablet Take 1 tablet (25 mg total) by mouth daily. 90 tablet 3  . ranitidine (ZANTAC) 150 MG tablet Take 150 mg by mouth 2 (two) times daily as needed for heartburn. Reported on 05/19/2015    . simvastatin (ZOCOR) 20 MG tablet Take 1 tablet (20 mg total) by mouth daily. 90 tablet 3   No facility-administered medications prior to visit.      Allergies:   Amoxicillin-pot clavulanate; Calcium; Erythromycin; Guaifenesin; Naproxen; Oxycodone-acetaminophen;  Oxycodone-acetaminophen; Oxycodone-acetaminophen; Oxycodone-aspirin; Sertraline; and Sertraline hcl   Past Medical History:  Diagnosis Date  . Anxiety   . Basal cell cancer   . Basal cell carcinoma    face  . Depression   . DVT (deep venous thrombosis) ([place]) 1/08  . Esophageal erosions   . Factor V Leiden ([place])    On coag x 1 year ending in 09  . Gastritis 1990   hospital- abd pain  . GERD (gastroesophageal reflux disease)   . Radial head fracture 2011    Past Surgical History:  Procedure Laterality Date  . CHOLECYSTECTOMY    . COLONOSCOPY    . MOHS SURGERY  7/11   basal cell carcinoma near eye  . OOPHORECTOMY    . SHOULDER SURGERY  04/2006  . TONSILLECTOMY  1976  . UPPER GASTROINTESTINAL ENDOSCOPY       Social History:  The patient  reports that she has never smoked. She has never used smokeless tobacco. She reports that she drinks alcohol. She reports that she does not use drugs.   Family History:  The patient's family history includes Atrial fibrillation in her brother; Breast cancer in her sister; Coronary artery disease in her brother; Depression in her mother; Diabetes in her brother; Berenice Primas' disease  in her mother; Heart attack in her brother and father; Hypertension in her brother and sister; Osteoporosis in her mother.    ROS:  Please see the history of present illness. All other systems are reviewed and  Negative to the above problem except as noted.    PHYSICAL EXAM: VS:  BP (!) 148/90   Pulse 68   Ht 5\' 5"  (1.651 m)   Wt 192 lb (87.1 kg) Comment: pt stated weight, refused to stand on scale  BMI 31.95 kg/m   GEN: Well nourished, well developed, in no acute distress  HEENT: normal  Neck: no JVD, carotid bruits, or masses Cardiac: RRR; no murmurs, rubs, or gallops,no edema  Respiratory:  clear to auscultation bilaterally, normal work of breathing GI: soft, nontender, nondistended, + BS  No hepatomegaly  MS: no deformity Moving all extremities   Skin: warm  and dry, no rash Neuro:  Strength and sensation are intact Psych: euthymic mood, full affect   EKG:  EKG is not  ordered today.   Lipid Panel    Component Value Date/Time   CHOL 143 07/19/2015 0814   TRIG 77.0 07/19/2015 0814   HDL 57.80 07/19/2015 0814   CHOLHDL 2 07/19/2015 0814   VLDL 15.4 07/19/2015 0814   LDLCALC 70 07/19/2015 0814   LDLDIRECT 146.1 08/19/2006 0935      Wt Readings from Last 3 Encounters:  04/08/16 192 lb (87.1 kg)  12/27/15 205 lb (93 kg)  08/31/15 199 lb (90.3 kg)      ASSESSMENT AND PLAN:  1 Palpiatoins Pt denies   2  BP  BP is mildly increased  I would not change meds for now.     Current medicines are reviewed at length with the patient today.  The patient does not have concerns regarding medicines.  Signed, Dorris Carnes, MD  04/08/2016 3:47 PM    [facility] [address], [place], [place]  [address] Phone: [phone]; Fax: [phone]

## 2016-04-08 NOTE — Telephone Encounter (Signed)
Pt called because her viranex cream has expired and she has a fever blister that started today and would like to get this refilled at the [facility] in [place] so she can start using it today.  Can you please call this in and let the patient know.

## 2016-04-09 NOTE — Telephone Encounter (Signed)
Pt said she was trying to say zovirax in the message so Dr. Glori Bickers did send in the right medication and pt picked it up yesterday

## 2016-04-19 ENCOUNTER — Other Ambulatory Visit: Payer: Self-pay | Admitting: Family Medicine

## 2016-05-09 ENCOUNTER — Other Ambulatory Visit: Payer: Self-pay | Admitting: Family Medicine

## 2016-07-02 ENCOUNTER — Other Ambulatory Visit: Payer: Self-pay | Admitting: Obstetrics and Gynecology

## 2016-07-02 DIAGNOSIS — C50911 Malignant neoplasm of unspecified site of right female breast: Secondary | ICD-10-CM

## 2016-07-19 ENCOUNTER — Ambulatory Visit
Admission: RE | Admit: 2016-07-19 | Discharge: 2016-07-19 | Disposition: A | Discharge status: Home / Self Care | Payer: BLUE CROSS/BLUE SHIELD | Source: Ambulatory Visit | Attending: Obstetrics and Gynecology | Admitting: Obstetrics and Gynecology

## 2016-07-19 DIAGNOSIS — C50911 Malignant neoplasm of unspecified site of right female breast: Secondary | ICD-10-CM

## 2016-07-19 MED ORDER — GADOBENATE DIMEGLUMINE 529 MG/ML IV SOLN
20.0000 mL | Freq: Once | INTRAVENOUS | Status: AC | PRN
  Administered 2016-07-19: 19 mL via INTRAVENOUS

## 2016-07-22 ENCOUNTER — Other Ambulatory Visit: Payer: Self-pay | Admitting: Family Medicine

## 2016-08-15 ENCOUNTER — Other Ambulatory Visit: Payer: Self-pay | Admitting: Internal Medicine

## 2016-10-24 ENCOUNTER — Other Ambulatory Visit: Payer: Self-pay | Admitting: Family Medicine

## 2016-11-14 ENCOUNTER — Other Ambulatory Visit: Payer: Self-pay | Admitting: Family Medicine

## 2017-01-21 ENCOUNTER — Other Ambulatory Visit: Payer: Self-pay | Admitting: Family Medicine

## 2017-01-21 NOTE — Telephone Encounter (Signed)
Pt hasn't been seen in over a year and no future appts., please advise  

## 2017-01-21 NOTE — Telephone Encounter (Signed)
Please schedule f/u and refill until then  

## 2017-01-22 NOTE — Telephone Encounter (Signed)
CPE scheduled and med refilled  

## 2017-02-04 ENCOUNTER — Other Ambulatory Visit: Payer: Self-pay | Admitting: Internal Medicine

## 2017-03-02 ENCOUNTER — Telehealth: Payer: Self-pay | Admitting: Family Medicine

## 2017-03-02 DIAGNOSIS — R739 Hyperglycemia, unspecified: Secondary | ICD-10-CM

## 2017-03-02 DIAGNOSIS — Z0000 Encounter for general adult medical examination without abnormal findings: Secondary | ICD-10-CM

## 2017-03-02 DIAGNOSIS — E7800 Pure hypercholesterolemia, unspecified: Secondary | ICD-10-CM

## 2017-03-02 NOTE — Telephone Encounter (Signed)
-----   Message from Ellamae Sia sent at 02/24/2017 11:11 AM EDT ----- Regarding: Lab orders for Monday, 10.22.18 Patient is scheduled for CPX labs, please order future labs, Thanks , Karna Christmas

## 2017-03-03 ENCOUNTER — Other Ambulatory Visit (INDEPENDENT_AMBULATORY_CARE_PROVIDER_SITE_OTHER): Payer: BLUE CROSS/BLUE SHIELD

## 2017-03-03 DIAGNOSIS — R739 Hyperglycemia, unspecified: Secondary | ICD-10-CM | POA: Diagnosis not present

## 2017-03-03 DIAGNOSIS — E7800 Pure hypercholesterolemia, unspecified: Secondary | ICD-10-CM

## 2017-03-03 DIAGNOSIS — Z0000 Encounter for general adult medical examination without abnormal findings: Secondary | ICD-10-CM | POA: Diagnosis not present

## 2017-03-03 LAB — CBC WITH DIFFERENTIAL/PLATELET
BASOS PCT: 1.2 % (ref 0.0–3.0)
Basophils Absolute: 0.1 10*3/uL (ref 0.0–0.1)
EOS ABS: 0.3 10*3/uL (ref 0.0–0.7)
Eosinophils Relative: 4.6 % (ref 0.0–5.0)
HCT: 40.8 % (ref 36.0–46.0)
HEMOGLOBIN: 13.4 g/dL (ref 12.0–15.0)
LYMPHS ABS: 1.6 10*3/uL (ref 0.7–4.0)
LYMPHS PCT: 29.2 % (ref 12.0–46.0)
MCHC: 32.9 g/dL (ref 30.0–36.0)
MCV: 88.4 fl (ref 78.0–100.0)
MONO ABS: 0.4 10*3/uL (ref 0.1–1.0)
MONOS PCT: 7.5 % (ref 3.0–12.0)
NEUTROS ABS: 3.2 10*3/uL (ref 1.4–7.7)
Neutrophils Relative %: 57.5 % (ref 43.0–77.0)
Platelets: 192 10*3/uL (ref 150.0–400.0)
RBC: 4.62 Mil/uL (ref 3.87–5.11)
RDW: 13.4 % (ref 11.5–15.5)
WBC: 5.5 10*3/uL (ref 4.0–10.5)

## 2017-03-03 LAB — LIPID PANEL
CHOL/HDL RATIO: 2
Cholesterol: 137 mg/dL (ref 0–200)
HDL: 59.4 mg/dL (ref >39.00)
LDL CALC: 64 mg/dL (ref 0–99)
NONHDL: 77.38
TRIGLYCERIDES: 68 mg/dL (ref 0.0–149.0)
VLDL: 13.6 mg/dL (ref 0.0–40.0)

## 2017-03-03 LAB — TSH: TSH: 1.97 u[IU]/mL (ref 0.35–4.50)

## 2017-03-03 LAB — COMPREHENSIVE METABOLIC PANEL
ALK PHOS: 60 U/L (ref 39–117)
ALT: 14 U/L (ref 0–35)
AST: 15 U/L (ref 0–37)
Albumin: 4 g/dL (ref 3.5–5.2)
BUN: 10 mg/dL (ref 6–23)
CO2: 30 mEq/L (ref 19–32)
CREATININE: 0.68 mg/dL (ref 0.40–1.20)
Calcium: 9.2 mg/dL (ref 8.4–10.5)
Chloride: 106 mEq/L (ref 96–112)
GFR: 93.03 mL/min (ref >60.00)
GLUCOSE: 115 mg/dL — AB (ref 70–99)
Potassium: 4.1 mEq/L (ref 3.5–5.1)
Sodium: 141 mEq/L (ref 135–145)
TOTAL PROTEIN: 6.4 g/dL (ref 6.0–8.3)
Total Bilirubin: 0.6 mg/dL (ref 0.2–1.2)

## 2017-03-03 LAB — HEMOGLOBIN A1C: Hgb A1c MFr Bld: 5.8 % (ref 4.6–6.5)

## 2017-03-05 ENCOUNTER — Encounter: Payer: BLUE CROSS/BLUE SHIELD | Admitting: Family Medicine

## 2017-03-05 ENCOUNTER — Ambulatory Visit (INDEPENDENT_AMBULATORY_CARE_PROVIDER_SITE_OTHER): Payer: BLUE CROSS/BLUE SHIELD

## 2017-03-05 DIAGNOSIS — Z23 Encounter for immunization: Secondary | ICD-10-CM | POA: Diagnosis not present

## 2017-03-11 ENCOUNTER — Ambulatory Visit (INDEPENDENT_AMBULATORY_CARE_PROVIDER_SITE_OTHER): Payer: BLUE CROSS/BLUE SHIELD | Admitting: Family Medicine

## 2017-03-11 ENCOUNTER — Encounter: Payer: Self-pay | Admitting: Family Medicine

## 2017-03-11 VITALS — BP 122/74 | HR 72 | Temp 97.5°F | Ht 65.0 in | Wt 202.5 lb

## 2017-03-11 DIAGNOSIS — Z6833 Body mass index (BMI) 33.0-33.9, adult: Secondary | ICD-10-CM

## 2017-03-11 DIAGNOSIS — E6609 Other obesity due to excess calories: Secondary | ICD-10-CM

## 2017-03-11 DIAGNOSIS — R739 Hyperglycemia, unspecified: Secondary | ICD-10-CM | POA: Diagnosis not present

## 2017-03-11 DIAGNOSIS — F418 Other specified anxiety disorders: Secondary | ICD-10-CM

## 2017-03-11 DIAGNOSIS — E7800 Pure hypercholesterolemia, unspecified: Secondary | ICD-10-CM

## 2017-03-11 DIAGNOSIS — Z0000 Encounter for general adult medical examination without abnormal findings: Secondary | ICD-10-CM | POA: Diagnosis not present

## 2017-03-11 MED ORDER — CYCLOBENZAPRINE HCL 10 MG PO TABS
5.0000 mg | ORAL_TABLET | Freq: Three times a day (TID) | ORAL | 5 refills | Status: DC | PRN
Start: 2017-03-11 — End: 2018-04-03

## 2017-03-11 MED ORDER — SIMVASTATIN 20 MG PO TABS: 20.0000 mg | ORAL_TABLET | Freq: Every day | ORAL | 3 refills | Status: DC

## 2017-03-11 MED ORDER — BUPROPION HCL ER (XL) 150 MG PO TB24: ORAL_TABLET | ORAL | 3 refills | Status: DC

## 2017-03-11 NOTE — Assessment & Plan Note (Signed)
No issues lately  Continue to follow

## 2017-03-11 NOTE — Assessment & Plan Note (Signed)
Lab Results  Component Value Date   HGBA1C 5.8 03/03/2017   Up slightly  disc imp of low glycemic diet and wt loss to prevent DM2

## 2017-03-11 NOTE — Progress Notes (Signed)
Subjective:    Patient ID: Kristine [name], female    DOB: [birth year], 61 y.o.   MRN: [id number]  HPI Here for health maintenance exam and to review chronic medical problems    Doing ok overall  Taking care of herself   Wt Readings from Last 3 Encounters:  03/11/17 202 lb 8 oz (91.9 kg)  04/08/16 192 lb (87.1 kg)  12/27/15 205 lb (93 kg)  trying desperately to loose weight  1200 or less calories or less per day consistently  Using a plexus system incl magnesium and bioflavinoid complex, vitamins, probiotics and chromium and caffeine  In her 3rd month  Exercise - walking and yoga at home  (difficult to fit in) -- not often  33.70 kg/m   She was always skinny  Then tx post partum dep and started gaining with medication  Now working long hours and sitting    She has considered the keto diet    Mammogram 2/18 with MRI - in the spring (doing mammo every other and MRI every other  Self breast exam -no lumps  fam hx of breast cancer in sister   HIV screen -has had before with blood donation    Tetanus shot 7/09  Pap 4/18 with Dr Helane Rima  Was normal   Colonoscopy 1/13 10 year recall    Had her flu shot 10/24  Zoster vaccine 10/17  BP Readings from Last 3 Encounters:  03/11/17 122/74  04/08/16 (!) 148/90  12/27/15 130/80    Hyperlipidemia  fam hx of CAD Lab Results  Component Value Date   CHOL 137 03/03/2017   CHOL 143 07/19/2015   CHOL 207 (H) 04/19/2015   Lab Results  Component Value Date   HDL 59.40 03/03/2017   HDL 57.80 07/19/2015   HDL 56.50 04/19/2015   Lab Results  Component Value Date   LDLCALC 64 03/03/2017   LDLCALC 70 07/19/2015   LDLCALC 138 (H) 04/19/2015   Lab Results  Component Value Date   TRIG 68.0 03/03/2017   TRIG 77.0 07/19/2015   TRIG 60.0 04/19/2015   Lab Results  Component Value Date   CHOLHDL 2 03/03/2017   CHOLHDL 2 07/19/2015   CHOLHDL 4 04/19/2015   Lab Results  Component Value Date   LDLDIRECT 146.1  08/19/2006   simvastatin and diet   Hyperglycemia Lab Results  Component Value Date   HGBA1C 5.8 03/03/2017  up from 5.6  Lab Results  Component Value Date   CREATININE 0.68 03/03/2017   BUN 10 03/03/2017   NA 141 03/03/2017   K 4.1 03/03/2017   CL 106 03/03/2017   CO2 30 03/03/2017   Lab Results  Component Value Date   ALT 14 03/03/2017   AST 15 03/03/2017   ALKPHOS 60 03/03/2017   BILITOT 0.6 03/03/2017   Lab Results  Component Value Date   WBC 5.5 03/03/2017   HGB 13.4 03/03/2017   HCT 40.8 03/03/2017   MCV 88.4 03/03/2017   PLT 192.0 03/03/2017   glucose 115   Patient Active Problem List   Diagnosis Date Noted  . Need for hepatitis C screening test 12/27/2015  . Hyperglycemia 04/21/2015  . Stress reaction 04/21/2015  . Elevated BP 04/21/2015  . Cough 10/27/2013  . Abdominal pain, other specified site 08/26/2013  . ETD (eustachian tube dysfunction) 06/21/2013  . Migraine with aura 06/21/2013  . Pelvic mass in female 09/01/2012  . Obesity 06/16/2011  . Rhinitis 06/14/2011  . Routine general medical examination  at a health care facility 06/14/2011  . SCIATICA 05/22/2009  . GASTROESOPHAGEAL REFLUX DISEASE 04/14/2009  . CONGENITAL DEFICIENCY OF OTHER CLOTTING FACTORS 12/07/2007  . ANXIETY 11/25/2006  . Depression with anxiety 11/25/2006  . ESOPHAGITIS 11/25/2006  . IBS 11/25/2006  . DVT, HX OF 11/25/2006  . MIGRAINES, HX OF 11/25/2006  . HYPERCHOLESTEROLEMIA, PURE 10/29/2006   Past Medical History:  Diagnosis Date  . Anxiety   . Basal cell cancer   . Basal cell carcinoma    face  . Depression   . DVT (deep venous thrombosis) ([place]) 1/08  . Esophageal erosions   . Factor V Leiden ([place])    On coag x 1 year ending in 09  . Gastritis 1990   hospital- abd pain  . GERD (gastroesophageal reflux disease)   . Radial head fracture 2011   Past Surgical History:  Procedure Laterality Date  . CHOLECYSTECTOMY    . COLONOSCOPY    . MOHS SURGERY  7/11    basal cell carcinoma near eye  . OOPHORECTOMY    . SHOULDER SURGERY  04/2006  . TONSILLECTOMY  1976  . UPPER GASTROINTESTINAL ENDOSCOPY     Social History  Substance Use Topics  . Smoking status: Never Smoker  . Smokeless tobacco: Never Used  . Alcohol use 0.0 oz/week     Comment: occ   Family History  Problem Relation Age of Onset  . Graves' disease Mother   . Depression Mother   . Osteoporosis Mother   . Heart attack Father        x 3   . Breast cancer Sister   . Heart attack Brother        x 2  . Diabetes Brother   . Hypertension Brother   . Hypertension Sister   . Coronary artery disease Brother        with stents   . Atrial fibrillation Brother   . Colon cancer Neg Hx    Allergies  Allergen Reactions  . Amoxicillin-Pot Clavulanate     REACTION: GI  . Calcium     REACTION: muscle aches  . Erythromycin     REACTION: GI  . Guaifenesin     REACTION: GI  . Naproxen     REACTION: GI  . Oxycodone-Acetaminophen     REACTION: reaction not known  . Oxycodone-Acetaminophen Other (See Comments)    Other Reaction: Not Assessed  . Oxycodone-Acetaminophen Other (See Comments)    Other Reaction: Not Assessed  . Oxycodone-Aspirin     REACTION: reaction not known  . Sertraline Other (See Comments)    Other Reaction: Not Assessed  . Sertraline Hcl     REACTION: hives   Current Outpatient Prescriptions on File Prior to Visit  Medication Sig Dispense Refill  . acetaminophen (TYLENOL) 500 MG tablet Take 500 mg by mouth every 6 (six) hours as needed.      Marland Kitchen acyclovir ointment (ZOVIRAX) 5 % Apply 1 application topically every 3 (three) hours. To cold sore as needed 5 g 3  . ALPRAZolam (XANAX) 0.25 MG tablet Take 0.25 mg by mouth at bedtime as needed for anxiety.    Marland Kitchen aspirin 81 MG tablet Take 81 mg by mouth daily.      Marland Kitchen esomeprazole (NEXIUM) 40 MG capsule Take 40 mg by mouth daily as needed. HEARTBURN    . fexofenadine (ALLEGRA) 180 MG tablet Take 180 mg by mouth daily as  needed. Reported on 05/19/2015    . fluticasone (FLONASE) 50  MCG/ACT nasal spray PLACE 2 SPRAYS INTO THE NOSE DAILY. (Patient taking differently: PLACE 2 SPRAYS INTO THE NOSE DAILY AS NEEDED) 16 g 11  . loratadine (CLARITIN) 10 MG tablet Take 10 mg by mouth as needed for allergies. Reported on 05/19/2015    . metoprolol succinate (TOPROL-XL) 25 MG 24 hr tablet TAKE 1 TABLET (25 MG TOTAL) BY MOUTH DAILY. 90 tablet 0  . ranitidine (ZANTAC) 150 MG tablet Take 150 mg by mouth 2 (two) times daily as needed for heartburn. Reported on 05/19/2015     No current facility-administered medications on file prior to visit.     Review of Systems  Constitutional: Positive for unexpected weight change. Negative for activity change, appetite change, fatigue and fever.  HENT: Negative for congestion, ear pain, rhinorrhea, sinus pressure and sore throat.   Eyes: Negative for pain, redness and visual disturbance.  Respiratory: Negative for cough, shortness of breath and wheezing.   Cardiovascular: Negative for chest pain and palpitations.  Gastrointestinal: Negative for abdominal pain, blood in stool, constipation and diarrhea.  Endocrine: Negative for polydipsia and polyuria.  Genitourinary: Negative for dysuria, frequency and urgency.  Musculoskeletal: Negative for arthralgias, back pain and myalgias.  Skin: Negative for pallor and rash.  Allergic/Immunologic: Negative for environmental allergies.  Neurological: Negative for dizziness, syncope and headaches.  Hematological: Negative for adenopathy. Does not bruise/bleed easily.  Psychiatric/Behavioral: Negative for decreased concentration and dysphoric mood. The patient is not nervous/anxious.        Objective:   Physical Exam  Constitutional: She appears well-developed and well-nourished. No distress.  obese and well appearing   HENT:  Head: Normocephalic and atraumatic.  Right Ear: External ear normal.  Left Ear: External ear normal.  Nose: Nose normal.    Mouth/Throat: Oropharynx is clear and moist.  Eyes: Pupils are equal, round, and reactive to light. Conjunctivae and EOM are normal. Right eye exhibits no discharge. Left eye exhibits no discharge. No scleral icterus.  Neck: Normal range of motion. Neck supple. No JVD present. Carotid bruit is not present. No thyromegaly present.  Cardiovascular: Normal rate, regular rhythm, normal heart sounds and intact distal pulses.  Exam reveals no gallop.   Pulmonary/Chest: Effort normal and breath sounds normal. No respiratory distress. She has no wheezes. She has no rales.  Abdominal: Soft. Bowel sounds are normal. She exhibits no distension and no mass. There is no tenderness.  Musculoskeletal: She exhibits no edema or tenderness.  Lymphadenopathy:    She has no cervical adenopathy.  Neurological: She is alert. She has normal reflexes. No cranial nerve deficit. She exhibits normal muscle tone. Coordination normal.  Skin: Skin is warm and dry. No rash noted. No erythema. No pallor.  Solar lentigines diffusely   Psychiatric: She has a normal mood and affect.  Pleasant and talkative           Assessment & Plan:   Problem List Items Addressed This Visit      Other   Depression with anxiety - Primary    Doing well with wellbutrin and prn xanax  Wants to continue this  Reviewed stressors/ coping techniques/symptoms/ support sources/ tx options and side effects in detail today       Relevant Medications   buPROPion (WELLBUTRIN XL) 150 MG 24 hr tablet   HYPERCHOLESTEROLEMIA, PURE    Disc goals for lipids and reasons to control them Rev labs with pt Rev low sat fat diet in detail  Very well controlled with simvastatin and diet  Relevant Medications   simvastatin (ZOCOR) 20 MG tablet   Hyperglycemia    Lab Results  Component Value Date   HGBA1C 5.8 03/03/2017   Up slightly  disc imp of low glycemic diet and wt loss to prevent DM2       Obesity    Pt is struggling to loose wt   Discussed how this problem influences overall health and the risks it imposes  Reviewed plan for weight loss with lower calorie diet (via better food choices and also portion control or program like weight watchers) and exercise building up to or more than 30 minutes 5 days per week including some aerobic activity   Disc wt loss product she has been using-not effective  Disc consideration of lower carb or keto diet to see if this helps the insulin resistance Must change schedule to add exercise as well       Routine general medical examination at a health care facility    Reviewed health habits including diet and exercise and skin cancer prevention Reviewed appropriate screening tests for age  Also reviewed health mt list, fam hx and immunization status , as well as social and family history   See HPI Labs reviewed  Sent for last pap report  Disc plan for exercise and wt loss

## 2017-03-11 NOTE — Assessment & Plan Note (Signed)
Reviewed health habits including diet and exercise and skin cancer prevention Reviewed appropriate screening tests for age  Also reviewed health mt list, fam hx and immunization status , as well as social and family history   See HPI Labs reviewed  Sent for last pap report  Disc plan for exercise and wt loss

## 2017-03-11 NOTE — Assessment & Plan Note (Signed)
Pt is struggling to loose wt  Discussed how this problem influences overall health and the risks it imposes  Reviewed plan for weight loss with lower calorie diet (via better food choices and also portion control or program like weight watchers) and exercise building up to or more than 30 minutes 5 days per week including some aerobic activity   Disc wt loss product she has been using-not effective  Disc consideration of lower carb or keto diet to see if this helps the insulin resistance Must change schedule to add exercise as well

## 2017-03-11 NOTE — Assessment & Plan Note (Signed)
Disc goals for lipids and reasons to control them Rev labs with pt Rev low sat fat diet in detail  Very well controlled with simvastatin and diet

## 2017-03-11 NOTE — Patient Instructions (Addendum)
You will need to exercise at least 30 minutes 5 days per week for weight loss and better health   Look into lower carb options  Keto - is an option   Try to get most of your carbohydrates from produce (with the exception of white potatoes)  Eat less bread/pasta/rice/snack foods/cereals/sweets and other items from the middle of the grocery store (processed carbs)   We will send for your last pap report

## 2017-03-11 NOTE — Assessment & Plan Note (Signed)
Doing well with wellbutrin and prn xanax  Wants to continue this  Reviewed stressors/ coping techniques/symptoms/ support sources/ tx options and side effects in detail today

## 2017-03-14 ENCOUNTER — Encounter: Payer: Self-pay | Admitting: Internal Medicine

## 2017-03-28 ENCOUNTER — Encounter: Payer: Self-pay | Admitting: Internal Medicine

## 2017-03-28 ENCOUNTER — Ambulatory Visit (INDEPENDENT_AMBULATORY_CARE_PROVIDER_SITE_OTHER): Payer: BLUE CROSS/BLUE SHIELD | Admitting: Internal Medicine

## 2017-03-28 VITALS — BP 118/82 | HR 64 | Ht 66.0 in | Wt 203.0 lb

## 2017-03-28 DIAGNOSIS — R002 Palpitations: Secondary | ICD-10-CM | POA: Diagnosis not present

## 2017-03-28 DIAGNOSIS — I10 Essential (primary) hypertension: Secondary | ICD-10-CM | POA: Diagnosis not present

## 2017-03-28 NOTE — Progress Notes (Signed)
Cardiology Office Note   Date:  03/28/2017   ID:  Taralyn, Ferraiolo 23-Jul-1955, MRN [id number]  PCP:  Abner Greenspan, MD  Cardiologist:   Dorris Carnes, MD   Pt presents for f/u of palpitations     History of Present Illness: Kristine [name] is a 61 y.o. female with a history of palpitatoins  I saw her in April   Echo normal  Myoview normal  Rx toprol   Since seen she denies signif palpitations  Breathing is OK     Breathing good  No palpiatioitns   At work she is  sitting  10 to 14 hours per day  Does not exercise regularly  Outpatient Medications Prior to Visit  Medication Sig Dispense Refill  . acetaminophen (TYLENOL) 500 MG tablet Take 500 mg by mouth every 6 (six) hours as needed.      Marland Kitchen acyclovir ointment (ZOVIRAX) 5 % Apply 1 application topically every 3 (three) hours. To cold sore as needed 5 g 3  . ALPRAZolam (XANAX) 0.25 MG tablet Take 0.25 mg by mouth at bedtime as needed for anxiety.    Marland Kitchen aspirin 81 MG tablet Take 81 mg by mouth daily.      Marland Kitchen buPROPion (WELLBUTRIN XL) 150 MG 24 hr tablet TAKE 1 TABLET (150 MG TOTAL) BY MOUTH DAILY. 90 tablet 3  . cyclobenzaprine (FLEXERIL) 10 MG tablet Take 0.5 tablets (5 mg total) by mouth 3 (three) times daily as needed for muscle spasms. 30 tablet 5  . esomeprazole (NEXIUM) 40 MG capsule Take 40 mg by mouth daily as needed. HEARTBURN    . fexofenadine (ALLEGRA) 180 MG tablet Take 180 mg by mouth daily as needed. Reported on 05/19/2015    . fluticasone (FLONASE) 50 MCG/ACT nasal spray PLACE 2 SPRAYS INTO THE NOSE DAILY. (Patient taking differently: PLACE 2 SPRAYS INTO THE NOSE DAILY AS NEEDED) 16 g 11  . loratadine (CLARITIN) 10 MG tablet Take 10 mg by mouth as needed for allergies. Reported on 05/19/2015    . metoprolol succinate (TOPROL-XL) 25 MG 24 hr tablet TAKE 1 TABLET (25 MG TOTAL) BY MOUTH DAILY. 90 tablet 0  . NON FORMULARY PLEXUS: DIETARY SUPPLEMENT    . NON FORMULARY PLEXUS: BIO CLEANSE    . NON FORMULARY PLEXUS:  PRO BIO 5    . ranitidine (ZANTAC) 150 MG tablet Take 150 mg by mouth 2 (two) times daily as needed for heartburn. Reported on 05/19/2015    . simvastatin (ZOCOR) 20 MG tablet Take 1 tablet (20 mg total) by mouth daily. 90 tablet 3   No facility-administered medications prior to visit.      Allergies:   Amoxicillin-pot clavulanate; Calcium; Erythromycin; Guaifenesin; Naproxen; Oxycodone-acetaminophen; Oxycodone-acetaminophen; Oxycodone-acetaminophen; Oxycodone-aspirin; Sertraline; and Sertraline hcl   Past Medical History:  Diagnosis Date  . Anxiety   . Basal cell cancer   . Basal cell carcinoma    face  . Depression   . DVT (deep venous thrombosis) ([place]) 1/08  . Esophageal erosions   . Factor V Leiden ([place])    On coag x 1 year ending in 09  . Gastritis 1990   hospital- abd pain  . GERD (gastroesophageal reflux disease)   . Radial head fracture 2011    Past Surgical History:  Procedure Laterality Date  . CHOLECYSTECTOMY    . COLONOSCOPY    . MOHS SURGERY  7/11   basal cell carcinoma near eye  . OOPHORECTOMY    . SHOULDER SURGERY  04/2006  . TONSILLECTOMY  1976  . UPPER GASTROINTESTINAL ENDOSCOPY       Social History:  The patient  reports that  has never smoked. she has never used smokeless tobacco. She reports that she drinks alcohol. She reports that she does not use drugs.   Family History:  The patient's family history includes Atrial fibrillation in her brother; Breast cancer in her sister; Coronary artery disease in her brother; Depression in her mother; Diabetes in her brother; Berenice Primas' disease in her mother; Heart attack in her brother and father; Hypertension in her brother and sister; Osteoporosis in her mother.    ROS:  Please see the history of present illness. All other systems are reviewed and  Negative to the above problem except as noted.    PHYSICAL EXAM: VS:  BP 118/82   Pulse 64   Ht 5\' 6"  (1.676 m)   Wt 203 lb (92.1 kg)   SpO2 98%   BMI 32.77  kg/m   GEN: Well nourished, well developed, in no acute distress  HEENT: normal  Neck: no JVD, carotid bruits, or masses Cardiac: RRR; no murmurs, rubs, or gallops,no edema  Respiratory:  clear to auscultation bilaterally, normal work of breathing GI: soft, nontender, nondistended, + BS  No hepatomegaly  MS: no deformity Moving all extremities   Skin: warm and dry, no rash Neuro:  Strength and sensation are intact Psych: euthymic mood, full affect   EKG:  EKG is not  ordered today.  SR 64 bpm     Lipid Panel    Component Value Date/Time   CHOL 137 03/03/2017 0819   TRIG 68.0 03/03/2017 0819   HDL 59.40 03/03/2017 0819   CHOLHDL 2 03/03/2017 0819   VLDL 13.6 03/03/2017 0819   LDLCALC 64 03/03/2017 0819   LDLDIRECT 146.1 08/19/2006 0935      Wt Readings from Last 3 Encounters:  03/28/17 203 lb (92.1 kg)  03/11/17 202 lb 8 oz (91.9 kg)  04/08/16 192 lb (87.1 kg)      ASSESSMENT AND PLAN:  1 Palpiatoins Pt denies   2  BP  BP is good  Keep on same regimen   Current medicines are reviewed at length with the patient today.  The patient does not have concerns regarding medicines.  Signed, Dorris Carnes, MD  03/28/2017 11:29 AM    [facility] [place], [place], [place]  [address] Phone: [phone]; Fax: [phone]

## 2017-03-28 NOTE — Patient Instructions (Signed)
Your physician recommends that you continue on your current medications as directed. Please refer to the Current Medication list given to you today. Your physician wants you to follow-up in: 1 year with Dr. Ross.  You will receive a reminder letter in the mail two months in advance. If you don't receive a letter, please call our office to schedule the follow-up appointment.  

## 2017-04-30 ENCOUNTER — Other Ambulatory Visit: Payer: Self-pay | Admitting: Internal Medicine

## 2017-06-27 LAB — HM MAMMOGRAPHY

## 2017-06-30 ENCOUNTER — Encounter: Payer: Self-pay | Admitting: Family Medicine

## 2017-06-30 ENCOUNTER — Ambulatory Visit (INDEPENDENT_AMBULATORY_CARE_PROVIDER_SITE_OTHER): Payer: BLUE CROSS/BLUE SHIELD | Admitting: Family Medicine

## 2017-06-30 DIAGNOSIS — J069 Acute upper respiratory infection, unspecified: Secondary | ICD-10-CM | POA: Diagnosis not present

## 2017-06-30 MED ORDER — FLUTICASONE PROPIONATE 50 MCG/ACT NA SUSP: NASAL | Status: DC

## 2017-06-30 MED ORDER — DOXYCYCLINE HYCLATE 100 MG PO TABS: 100.0000 mg | ORAL_TABLET | Freq: Two times a day (BID) | ORAL | 0 refills | Status: DC

## 2017-06-30 MED ORDER — ALBUTEROL SULFATE HFA 108 (90 BASE) MCG/ACT IN AERS: 1.0000 | INHALATION_SPRAY | Freq: Four times a day (QID) | RESPIRATORY_TRACT | 0 refills | Status: DC | PRN

## 2017-06-30 NOTE — Patient Instructions (Addendum)
Rest, fluids, tylenol and albuterol as needed.   If you have the flu then tamiflu likely wouldn't help.  It doesn't really make sense to test you for the flu at this point.  If you have progressive symptoms for longer than 1 week with more sinus pain then start the doxycycline.   Take care.  Glad to see you.  Update [place] as needed.

## 2017-06-30 NOTE — Progress Notes (Signed)
She is out of work today and that is atypical for her.    Sx started with cough on 06/26/17.  She thought it was a cold initially.  She has been mainly in bed since Friday.  Aching, chills and sweats in the meantime.  Cough.  Some diarrhea, episodically.  Still aching, with HA.  Facial pain, also with some pain along the mandible intermittently on the R.    She has a flu shot.    Meds, vitals, and allergies reviewed.   ROS: Per HPI unless specifically indicated in ROS section   GEN: nad, alert and oriented HEENT: mucous membranes moist, tm w/o erythema, nasal exam w/o erythema, clear discharge noted,  OP with cobblestoning NECK: supple w/o LA CV: rrr.   PULM: ctab, no inc wob, but cough noted.  EXT: no edema SKIN: well perfused.  Frontal sinuses ttp B.

## 2017-07-02 DIAGNOSIS — J069 Acute upper respiratory infection, unspecified: Secondary | ICD-10-CM | POA: Insufficient documentation

## 2017-07-02 NOTE — Assessment & Plan Note (Signed)
Nontoxic.  Okay for outpatient follow-up. Rest, fluids, use tylenol and albuterol as needed.   If she had the flu then tamiflu likely wouldn't help, given her timeline of sx.   It doesn't really make sense to test her for the flu at this point.  D/w pt.  She agrees.   If she had progressive symptoms for longer than 1 week with more sinus pain then reasonable to start the doxycycline.   Update [place] as needed.   She agrees with plan.

## 2017-07-03 ENCOUNTER — Encounter: Payer: Self-pay | Admitting: Family Medicine

## 2017-07-27 ENCOUNTER — Other Ambulatory Visit: Payer: Self-pay | Admitting: Family Medicine

## 2017-11-19 ENCOUNTER — Encounter: Payer: Self-pay | Admitting: Family Medicine

## 2017-11-19 ENCOUNTER — Ambulatory Visit
Admission: RE | Admit: 2017-11-19 | Discharge: 2017-11-19 | Disposition: A | Discharge status: Home / Self Care | Payer: BLUE CROSS/BLUE SHIELD | Source: Ambulatory Visit | Attending: Family Medicine | Admitting: Family Medicine

## 2017-11-19 ENCOUNTER — Ambulatory Visit (INDEPENDENT_AMBULATORY_CARE_PROVIDER_SITE_OTHER): Payer: BLUE CROSS/BLUE SHIELD | Admitting: Family Medicine

## 2017-11-19 VITALS — BP 168/88 | HR 74 | Temp 97.5°F | Ht 66.0 in

## 2017-11-19 DIAGNOSIS — M7989 Other specified soft tissue disorders: Secondary | ICD-10-CM

## 2017-11-19 DIAGNOSIS — I82819 Embolism and thrombosis of superficial veins of unspecified lower extremities: Secondary | ICD-10-CM | POA: Insufficient documentation

## 2017-11-19 DIAGNOSIS — M79662 Pain in left lower leg: Secondary | ICD-10-CM

## 2017-11-19 DIAGNOSIS — R600 Localized edema: Secondary | ICD-10-CM | POA: Diagnosis not present

## 2017-11-19 NOTE — Patient Instructions (Signed)
Please stop at front desk to see one of the referrals coordinators.   Will notify you of results when available.   Elevate legs as much as possible

## 2017-11-19 NOTE — Progress Notes (Signed)
Subjective:    Patient ID: Kristine [name], female    DOB: [birth year], 62 y.o.   MRN: [id number]  HPI This is a 62 yo female who presents with an area of swelling on her right upper leg. She noticed 5 days ago when on her way home from the beach. Area pink, still painful and enlarged.  Has history of DVT in left leg 2007. Over last week, has had increased swelling of left leg and foot.   Past Medical History:  Diagnosis Date  . Anxiety   . Basal cell cancer   . Basal cell carcinoma    face  . Depression   . DVT (deep venous thrombosis) ([place]) 1/08  . Esophageal erosions   . Factor V Leiden ([place])    On coag x 1 year ending in 09  . Gastritis 1990   hospital- abd pain  . GERD (gastroesophageal reflux disease)   . Radial head fracture 2011   Past Surgical History:  Procedure Laterality Date  . CHOLECYSTECTOMY    . COLONOSCOPY    . MOHS SURGERY  7/11   basal cell carcinoma near eye  . OOPHORECTOMY    . SHOULDER SURGERY  04/2006  . TONSILLECTOMY  1976  . UPPER GASTROINTESTINAL ENDOSCOPY     Family History  Problem Relation Age of Onset  . Graves' disease Mother   . Depression Mother   . Osteoporosis Mother   . Heart attack Father        x 3   . Breast cancer Sister   . Heart attack Brother        x 2  . Diabetes Brother   . Hypertension Brother   . Hypertension Sister   . Coronary artery disease Brother        with stents   . Atrial fibrillation Brother   . Colon cancer Neg Hx    Social History   Tobacco Use  . Smoking status: Never Smoker  . Smokeless tobacco: Never Used  Substance Use Topics  . Alcohol use: Yes    Alcohol/week: 0.0 oz    Comment: occ  . Drug use: No      Review of Systems Per HPI    Objective:   Physical Exam  Constitutional: She is oriented to person, place, and time. She appears well-developed and well-nourished. No distress.  HENT:  Head: Normocephalic and atraumatic.  Eyes: Conjunctivae are normal.  Cardiovascular:  Normal rate.  Pulmonary/Chest: Effort normal.  Musculoskeletal: She exhibits edema.  Left leg with generalized redness and swelling. Bilateral legs same temperature.   Neurological: She is alert and oriented to person, place, and time.  Skin: Skin is warm and dry. She is not diaphoretic.  Right upper medial thigh with palpable knot and tenderness. Mild erythema.   Psychiatric: She has a normal mood and affect. Her behavior is normal. Judgment and thought content normal.  Vitals reviewed.     BP (!) 168/88 (BP Location: Left Arm, Patient Position: Sitting, Cuff Size: Large)   Pulse 74   Temp (!) 97.5 F (36.4 C) (Oral)   Ht 5\' 6"  (1.676 m)   SpO2 99%   BMI 32.56 kg/m      Assessment & Plan:  1. Right leg swelling - will get stat US given DVT history - US Venous Img Lower Bilateral; Future  2. Pain and swelling of lower leg, left - US Venous Img Lower Bilateral; Future   Clarene Reamer, FNP-BC  [facility]  at [facility]  11/19/2017 3:20 PM

## 2017-11-19 NOTE — Addendum Note (Signed)
Addended by: Clarene Reamer B on: 11/19/2017 05:50 PM   Modules accepted: Orders

## 2017-11-27 ENCOUNTER — Other Ambulatory Visit: Payer: Self-pay

## 2017-11-27 ENCOUNTER — Ambulatory Visit (INDEPENDENT_AMBULATORY_CARE_PROVIDER_SITE_OTHER): Payer: BLUE CROSS/BLUE SHIELD

## 2017-11-27 DIAGNOSIS — R600 Localized edema: Secondary | ICD-10-CM | POA: Diagnosis not present

## 2017-11-28 ENCOUNTER — Telehealth: Payer: Self-pay | Admitting: Family Medicine

## 2017-11-28 NOTE — Telephone Encounter (Signed)
Copied from [place] [id number]. Topic: Quick Communication - See Telephone Encounter >> Nov 28, 2017  2:39 PM Kristine [name] wrote: Pt is not able to get in with her cardiologist for 2 months.  Pt is wanting to know if this is something she needs to wait that long on?

## 2017-11-28 NOTE — Telephone Encounter (Signed)
Patient saw Kristine Netters, Kristine [name] on 11/19/17 for R Leg Swelling - [place] neg for DVT was set up for an ECHO yesterday and was contacted with results today.  ECHO showed an abnormality, per patient, and she was instructed to follow up with cardiology; however, they say they cannot see her until September.  Patient is concerned that this is too far out and would like our assistance communicating with cardiology office if we deem it necessary to be seen sooner.  Please follow up with patient how we will proceed.  Thanks.   I will copy both Dr. Glori Bickers and Kristine Netters, Kristine [name] on this, as D. Carlean Purl, Kristine [name] has been following this current care situation.

## 2017-11-30 NOTE — Telephone Encounter (Signed)
Please ask her if she is ok seeing an extender -perhaps it would affect how soon she could get in (not sure)  I think she wanted to see Dr Harrington Challenger?

## 2017-12-01 NOTE — Telephone Encounter (Signed)
I will ask [facility] if she can get in earlier with an extender or other provider  Please let pt know

## 2017-12-01 NOTE — Telephone Encounter (Signed)
Pt informed of below. She wants to know your opinion on if she can wait for further evaluation for this until 01/30/18? Do you think she needs to see another cardiologist(different group)? She feels she should not wait 2 months and she states she will call Dr. Harrington Challenger' office to see if she can see someone else sooner as she does not have a problem with seeing someone other than Dr. Harrington Challenger.  Please advise

## 2017-12-01 NOTE — Telephone Encounter (Signed)
Appointment made to see Dr Harrington Challenger tomorrow 12/02/2017 at 8:40 am. Left message for patient and spoke with patient's husband and relayed the information also-Anastasiya V Hopkins, RMA

## 2017-12-01 NOTE — Telephone Encounter (Signed)
That is great. Thanks! 

## 2017-12-02 ENCOUNTER — Encounter: Payer: Self-pay | Admitting: Internal Medicine

## 2017-12-02 ENCOUNTER — Ambulatory Visit (INDEPENDENT_AMBULATORY_CARE_PROVIDER_SITE_OTHER): Payer: BLUE CROSS/BLUE SHIELD | Admitting: Internal Medicine

## 2017-12-02 VITALS — BP 130/78 | HR 65 | Ht 66.0 in | Wt 206.4 lb

## 2017-12-02 DIAGNOSIS — I272 Pulmonary hypertension, unspecified: Secondary | ICD-10-CM

## 2017-12-02 DIAGNOSIS — R002 Palpitations: Secondary | ICD-10-CM | POA: Diagnosis not present

## 2017-12-02 DIAGNOSIS — I10 Essential (primary) hypertension: Secondary | ICD-10-CM

## 2017-12-02 MED ORDER — POTASSIUM CHLORIDE ER 10 MEQ PO TBCR: 10.0000 meq | EXTENDED_RELEASE_TABLET | ORAL | 3 refills | Status: DC

## 2017-12-02 MED ORDER — FUROSEMIDE 20 MG PO TABS: 20.0000 mg | ORAL_TABLET | ORAL | 3 refills | Status: DC

## 2017-12-02 NOTE — Patient Instructions (Signed)
Your physician has recommended you make the following change in your medication:   twice a week (Mon/Thur) take:  1.) furosemide (Lasix) 20 mg  2.) potassium chloride 10 meq  Your physician recommends that you return for lab work at PCP in about 1 month (BMET)-take prescription with you  Your physician recommends that you schedule a follow-up appointment in: December, 2019 with Dr. Harrington Challenger  We will plan to repeat echocardiogram in spring

## 2017-12-02 NOTE — Progress Notes (Signed)
Cardiology Office Note   Date:  12/02/2017   ID:  Kristine [name], Kristine [name] 1955/09/06, MRN [id number]  PCP:  Abner Greenspan, MD  Cardiologist:   Dorris Carnes, MD   Pt presents for f/u of echo     History of Present Illness: Kristine [name] is a 62 y.o. female with a history of palpitatoins   Echo norma in 2017l  Myoview normal  Rx toprol   Since seen in Novmeber she has done OK    She did have swelling in R leg recently  Seen by Glenda Chroman in IM clinic   LE dopplers done    Found to have superfiscial thrombophlebitis  There was increased pulsitivity consistent with R heart presure elevaton   Echo recommended Echo was done on 11/27/17  LVEF normal  RV and RA normal in size   RVEF normal   Esitmated PAP was 47 mm Hg   IVC dilated   The pt says her breathing has been OK   SHe has noticed swelling in fet at times     Outpatient Medications Prior to Visit  Medication Sig Dispense Refill  . acetaminophen (TYLENOL) 500 MG tablet Take 500 mg by mouth every 6 (six) hours as needed.      Marland Kitchen acyclovir ointment (ZOVIRAX) 5 % Apply 1 application topically every 3 (three) hours. To cold sore as needed 5 g 3  . albuterol (PROVENTIL HFA;VENTOLIN HFA) 108 (90 Base) MCG/ACT inhaler INHALE 1-2 PUFFS INTO THE LUNGS EVERY 6 (SIX) HOURS AS NEEDED FOR WHEEZING OR SHORTNESS OF BREATH. 8.5 Inhaler 3  . ALPRAZolam (XANAX) 0.25 MG tablet Take 0.25 mg by mouth at bedtime as needed for anxiety.    Marland Kitchen aspirin 81 MG tablet Take 81 mg by mouth daily.      Marland Kitchen buPROPion (WELLBUTRIN XL) 150 MG 24 hr tablet TAKE 1 TABLET (150 MG TOTAL) BY MOUTH DAILY. 90 tablet 3  . cyclobenzaprine (FLEXERIL) 10 MG tablet Take 0.5 tablets (5 mg total) by mouth 3 (three) times daily as needed for muscle spasms. 30 tablet 5  . doxycycline (VIBRA-TABS) 100 MG tablet Take 1 tablet (100 mg total) by mouth 2 (two) times daily. 20 tablet 0  . esomeprazole (NEXIUM) 40 MG capsule Take 40 mg by mouth daily as needed. HEARTBURN    .  fexofenadine (ALLEGRA) 180 MG tablet Take 180 mg by mouth daily as needed. Reported on 05/19/2015    . fluticasone (FLONASE) 50 MCG/ACT nasal spray PLACE 2 SPRAYS INTO THE NOSE DAILY AS NEEDED    . loratadine (CLARITIN) 10 MG tablet Take 10 mg by mouth as needed for allergies. Reported on 05/19/2015    . metoprolol succinate (TOPROL-XL) 25 MG 24 hr tablet TAKE 1 TABLET BY MOUTH EVERY DAY 90 tablet 3  . NON FORMULARY PLEXUS: DIETARY SUPPLEMENT    . NON FORMULARY PLEXUS: BIO CLEANSE    . NON FORMULARY PLEXUS: PRO BIO 5    . ranitidine (ZANTAC) 150 MG tablet Take 150 mg by mouth 2 (two) times daily as needed for heartburn. Reported on 05/19/2015    . simvastatin (ZOCOR) 20 MG tablet Take 1 tablet (20 mg total) by mouth daily. 90 tablet 3   No facility-administered medications prior to visit.      Allergies:   Amoxicillin-pot clavulanate; Calcium; Erythromycin; Guaifenesin; Naproxen; Oxycodone-acetaminophen; Oxycodone-acetaminophen; Oxycodone-acetaminophen; Oxycodone-aspirin; Sertraline; and Sertraline hcl   Past Medical History:  Diagnosis Date  . Anxiety   . Basal cell cancer   .  Basal cell carcinoma    face  . Depression   . DVT (deep venous thrombosis) ([place]) 1/08  . Esophageal erosions   . Factor V Leiden ([place])    On coag x 1 year ending in 09  . Gastritis 1990   hospital- abd pain  . GERD (gastroesophageal reflux disease)   . Radial head fracture 2011    Past Surgical History:  Procedure Laterality Date  . CHOLECYSTECTOMY    . COLONOSCOPY    . MOHS SURGERY  7/11   basal cell carcinoma near eye  . OOPHORECTOMY    . SHOULDER SURGERY  04/2006  . TONSILLECTOMY  1976  . UPPER GASTROINTESTINAL ENDOSCOPY       Social History:  The patient  reports that she has never smoked. She has never used smokeless tobacco. She reports that she drinks alcohol. She reports that she does not use drugs.   Family History:  The patient's family history includes Atrial fibrillation in her brother;  Breast cancer in her sister; Coronary artery disease in her brother; Depression in her mother; Diabetes in her brother; Berenice Primas' disease in her mother; Heart attack in her brother and father; Hypertension in her brother and sister; Osteoporosis in her mother.    ROS:  Please see the history of present illness. All other systems are reviewed and  Negative to the above problem except as noted.    PHYSICAL EXAM: VS:  BP 130/78 (BP Location: Left Arm, Patient Position: Sitting, Cuff Size: Large)   Pulse 65   Ht 5\' 6"  (1.676 m)   Wt 206 lb 6.4 oz (93.6 kg)   SpO2 98%   BMI 33.31 kg/m   GEN: Well nourished, well developed, in no acute distress  HEENT: normal  Neck: no JVD, carotid bruits, or masses Cardiac: RRR; no murmurs, rubs, or gallops,  Tr LE edema  Respiratory:  clear to auscultation bilaterally, normal work of breathing GI: soft, nontender, nondistended, + BS  No hepatomegaly  MS: no deformity Moving all extremities   Skin: warm and dry, no rash Neuro:  Strength and sensation are intact Psych: euthymic mood, full affect   EKG:  EKG is not  ordered today   Lipid Panel    Component Value Date/Time   CHOL 137 03/03/2017 0819   TRIG 68.0 03/03/2017 0819   HDL 59.40 03/03/2017 0819   CHOLHDL 2 03/03/2017 0819   VLDL 13.6 03/03/2017 0819   LDLCALC 64 03/03/2017 0819   LDLDIRECT 146.1 08/19/2006 0935      Wt Readings from Last 3 Encounters:  12/02/17 206 lb 6.4 oz (93.6 kg)  06/30/17 201 lb 12 oz (91.5 kg)  03/28/17 203 lb (92.1 kg)      ASSESSMENT AND PLAN:  1   Abnormal echo    I have reviewed the echo images with pt and her husband   PAP Is an estimate   Pt may have some increased volume on exam   She admits to some swelling of hands though she does not have today   I do think changes are mild     I would recomm taking Lasix 20 with 10 KCL 2x per week    WOuld recomm BMET in 1 month I would like to see pt later this winter   F/U echo in spring to reassess  2  Palpiatoins Pt denies   2  BP  BP is adequate     Current medicines are reviewed at length with the patient today.  The patient does not have concerns regarding medicines.  Signed, Dorris Carnes, MD  12/02/2017 9:27 AM    [address], [place], [place]  [address] Phone: [phone]; Fax: [phone]

## 2018-01-02 ENCOUNTER — Telehealth: Payer: Self-pay

## 2018-01-02 NOTE — Telephone Encounter (Signed)
Unable to reach pt by phone. I spoke with Terri in lab and due to new policy  to have blood drawn at a [facility] lab has to be a [employer] provider.

## 2018-01-02 NOTE — Telephone Encounter (Signed)
Copied from [place] [id number] [id number]. Topic: Inquiry >> Jan 02, 2018  2:50 PM Reyne Dumas L wrote: Reason for CRM:   Pt states she was given a script by Dr. Harrington Challenger to have BMET drawn.  Pt wants to know if she can have it drawn at this office. Pt can be reached at [phone]

## 2018-01-08 ENCOUNTER — Other Ambulatory Visit: Payer: Self-pay | Admitting: Internal Medicine

## 2018-01-09 LAB — BASIC METABOLIC PANEL
BUN/Creatinine Ratio: 17 (ref 12–28)
BUN: 12 mg/dL (ref 8–27)
CALCIUM: 9.3 mg/dL (ref 8.7–10.3)
CO2: 24 mmol/L (ref 20–29)
CREATININE: 0.69 mg/dL (ref 0.57–1.00)
Chloride: 104 mmol/L (ref 96–106)
GFR calc Af Amer: 107 mL/min/{1.73_m2} (ref >59)
GFR, EST NON AFRICAN AMERICAN: 93 mL/min/{1.73_m2} (ref >59)
GLUCOSE: 103 mg/dL — AB (ref 65–99)
POTASSIUM: 3.9 mmol/L (ref 3.5–5.2)
Sodium: 143 mmol/L (ref 134–144)

## 2018-01-21 ENCOUNTER — Ambulatory Visit (INDEPENDENT_AMBULATORY_CARE_PROVIDER_SITE_OTHER): Payer: BLUE CROSS/BLUE SHIELD | Admitting: Family Medicine

## 2018-01-21 ENCOUNTER — Encounter: Payer: Self-pay | Admitting: Family Medicine

## 2018-01-21 VITALS — BP 142/94 | HR 62 | Temp 97.9°F | Ht 66.0 in

## 2018-01-21 DIAGNOSIS — B029 Zoster without complications: Secondary | ICD-10-CM

## 2018-01-21 DIAGNOSIS — Z8619 Personal history of other infectious and parasitic diseases: Secondary | ICD-10-CM | POA: Insufficient documentation

## 2018-01-21 DIAGNOSIS — F430 Acute stress reaction: Secondary | ICD-10-CM | POA: Diagnosis not present

## 2018-01-21 DIAGNOSIS — J069 Acute upper respiratory infection, unspecified: Secondary | ICD-10-CM

## 2018-01-21 DIAGNOSIS — B9789 Other viral agents as the cause of diseases classified elsewhere: Secondary | ICD-10-CM | POA: Diagnosis not present

## 2018-01-21 NOTE — Assessment & Plan Note (Signed)
Pt is resigning from current job an happy about it

## 2018-01-21 NOTE — Assessment & Plan Note (Signed)
Mild shingles rash in L  T 2-3 dermatomal distribution  Suspect mild due to past zostavax  pain is not severe but bothersome Recommend keeping clean with soap and water/ cool compress if needed  Taking ibuprofen prn - use caution with GI side eff  Out of window for antiviral tx Update if not starting to improve in a week or if worsening

## 2018-01-21 NOTE — Patient Instructions (Addendum)
Drink lots of fluids Nasal saline spray will help congestion  Over the counter antihistamine- for sneezing and runny nose and post nasal drip  Warm compresses on face for sinus fullness  A steroid nasal spray (flonase ) is helpful   Watch for fever / increase in sinus pain or colored nasal d/c   mucinex DM for cough   Update if not starting to improve in a week or if worsening    I think you have mild shingles  Stay away from immunocompromised people   It is too late for antiviral treatment  Use ibuprofen for pain as needed / tylenol is ok also  If you need something stronger let us know  Keep rash clean with soap and water  Cold compress is fine

## 2018-01-21 NOTE — Progress Notes (Signed)
Subjective:    Patient ID: Kristine [name], female    DOB: [birth year], 62 y.o.   MRN: [id number]  HPI  Here for rash - itchy  Trunk and left chest  Also sinus symptoms with headache  Refused wt today  bp was up  BP Readings from Last 3 Encounters:  01/21/18 (!) 142/94  12/02/17 130/78  11/19/17 (!) 168/88    A week ago sat- cold sore on lip  Then itching in back  Sunday- burping a lot (husband also) - along with pressure in upper abdomen - with frequent bms   ? If she picked something up  Continued several days - used tums and alka selzer   Developed pain in L scapula area along with itching  Bad enough to wake her up at night  Could not get comfortable  advil did not help  Always worse at night  Noticed some bumps there and around to her L breast  Skin feels weird and tender and burning  Bra hurts   Has had the shingles vaccine    Has felt yucky  Sneeze /cough  Lots of sneezing Bad sore throat  Sinus pressure (not pain) ethmoid and maxillary area  Post nasal drip  Nasal d/c-not a lot / mostly clear  Husband got better - (but he was on prednisone for something else)  Thought it was viral  No fever she knows of   Temp: 97.9 F (36.6 C)   Headache started today- took advil (frontal and both sides)  Cold symptoms are a bit better       Review of Systems     Objective:   Physical Exam  Constitutional: She appears well-developed and well-nourished. No distress.  obese and well appearing   HENT:  Head: Normocephalic and atraumatic.  Right Ear: External ear normal.  Left Ear: External ear normal.  Mouth/Throat: Oropharynx is clear and moist.  Nares are injected and congested  Clear rhinorrhea and pnd   Mild ethmoid sinus tenderness worse on the L   Eyes: Pupils are equal, round, and reactive to light. Conjunctivae and EOM are normal. Right eye exhibits no discharge. Left eye exhibits no discharge. No scleral icterus.  Neck: Normal range of  motion.  Cardiovascular: Normal rate, regular rhythm and normal heart sounds.  Pulmonary/Chest: Effort normal and breath sounds normal. No stridor. No respiratory distress. She has no wheezes. She has no rales.  Lymphadenopathy:    She has no cervical adenopathy.  Neurological: She is alert. No cranial nerve deficit. She exhibits normal muscle tone. Coordination normal.  Skin: Skin is warm and dry. Rash noted.  Papular/vesicular rash scattered following T2-3 dermatomes on the left (back/side of torso and underside of L breast)  No drainage Mildly sensitive/tender   Psychiatric: She has a normal mood and affect.          Assessment & Plan:   Problem List Items Addressed This Visit      Respiratory   Viral URI with cough    Reassuring exam  Disc symptomatic care - see instructions on AVS  Watch for sinus pain or purulent d/c Update if not starting to improve in a week or if worsening            Other   Stress reaction    Pt is resigning from current job an happy about it       [facility] - Primary    Mild shingles rash in L  T 2-3 dermatomal  distribution  Suspect mild due to past zostavax  pain is not severe but bothersome Recommend keeping clean with soap and water/ cool compress if needed  Taking ibuprofen prn - use caution with GI side eff  Out of window for antiviral tx Update if not starting to improve in a week or if worsening

## 2018-01-21 NOTE — Assessment & Plan Note (Signed)
Reassuring exam  Disc symptomatic care - see instructions on AVS  Watch for sinus pain or purulent d/c Update if not starting to improve in a week or if worsening

## 2018-01-30 ENCOUNTER — Ambulatory Visit: Payer: BLUE CROSS/BLUE SHIELD | Admitting: Internal Medicine

## 2018-02-18 ENCOUNTER — Telehealth: Payer: Self-pay

## 2018-02-18 NOTE — Telephone Encounter (Signed)
Pt notified of Dr. Tower's recommendations  

## 2018-02-18 NOTE — Telephone Encounter (Signed)
Copied from [place] [id number]. Topic: General - Other >> Feb 18, 2018  8:25 AM Janace Aris A wrote: Reason for CRM: patient called in wanting to make sure that it wouldn't be a problem getting her flu shot, since she has the shingles.   Please advise

## 2018-02-18 NOTE — Telephone Encounter (Signed)
That is fine -go ahead and get the flu vaccine

## 2018-03-30 ENCOUNTER — Other Ambulatory Visit (INDEPENDENT_AMBULATORY_CARE_PROVIDER_SITE_OTHER): Payer: BLUE CROSS/BLUE SHIELD

## 2018-03-30 ENCOUNTER — Telehealth (INDEPENDENT_AMBULATORY_CARE_PROVIDER_SITE_OTHER): Payer: BLUE CROSS/BLUE SHIELD | Admitting: Family Medicine

## 2018-03-30 DIAGNOSIS — R739 Hyperglycemia, unspecified: Secondary | ICD-10-CM | POA: Diagnosis not present

## 2018-03-30 DIAGNOSIS — E7800 Pure hypercholesterolemia, unspecified: Secondary | ICD-10-CM

## 2018-03-30 DIAGNOSIS — Z0000 Encounter for general adult medical examination without abnormal findings: Secondary | ICD-10-CM | POA: Diagnosis not present

## 2018-03-30 LAB — CBC WITH DIFFERENTIAL/PLATELET
Basophils Absolute: 0.1 10*3/uL (ref 0.0–0.1)
Basophils Relative: 1.2 % (ref 0.0–3.0)
EOS ABS: 0.2 10*3/uL (ref 0.0–0.7)
Eosinophils Relative: 3.7 % (ref 0.0–5.0)
HCT: 41 % (ref 36.0–46.0)
Hemoglobin: 13.7 g/dL (ref 12.0–15.0)
Lymphocytes Relative: 32.5 % (ref 12.0–46.0)
Lymphs Abs: 1.7 10*3/uL (ref 0.7–4.0)
MCHC: 33.4 g/dL (ref 30.0–36.0)
MCV: 86.2 fl (ref 78.0–100.0)
MONO ABS: 0.4 10*3/uL (ref 0.1–1.0)
Monocytes Relative: 6.9 % (ref 3.0–12.0)
NEUTROS ABS: 2.8 10*3/uL (ref 1.4–7.7)
Neutrophils Relative %: 55.7 % (ref 43.0–77.0)
PLATELETS: 179 10*3/uL (ref 150.0–400.0)
RBC: 4.76 Mil/uL (ref 3.87–5.11)
RDW: 13.4 % (ref 11.5–15.5)
WBC: 5.1 10*3/uL (ref 4.0–10.5)

## 2018-03-30 LAB — COMPREHENSIVE METABOLIC PANEL
ALT: 13 U/L (ref 0–35)
AST: 14 U/L (ref 0–37)
Albumin: 4 g/dL (ref 3.5–5.2)
Alkaline Phosphatase: 56 U/L (ref 39–117)
BUN: 16 mg/dL (ref 6–23)
CHLORIDE: 105 meq/L (ref 96–112)
CO2: 27 meq/L (ref 19–32)
Calcium: 9 mg/dL (ref 8.4–10.5)
Creatinine, Ser: 0.76 mg/dL (ref 0.40–1.20)
GFR: 81.54 mL/min (ref >60.00)
Glucose, Bld: 135 mg/dL — ABNORMAL HIGH (ref 70–99)
POTASSIUM: 3.8 meq/L (ref 3.5–5.1)
Sodium: 139 mEq/L (ref 135–145)
Total Bilirubin: 0.6 mg/dL (ref 0.2–1.2)
Total Protein: 6.4 g/dL (ref 6.0–8.3)

## 2018-03-30 LAB — LIPID PANEL
CHOLESTEROL: 148 mg/dL (ref 0–200)
HDL: 60.5 mg/dL (ref >39.00)
LDL Cholesterol: 68 mg/dL (ref 0–99)
NONHDL: 87.3
Total CHOL/HDL Ratio: 2
Triglycerides: 97 mg/dL (ref 0.0–149.0)
VLDL: 19.4 mg/dL (ref 0.0–40.0)

## 2018-03-30 LAB — TSH: TSH: 2.05 u[IU]/mL (ref 0.35–4.50)

## 2018-03-30 LAB — HEMOGLOBIN A1C: Hgb A1c MFr Bld: 5.9 % (ref 4.6–6.5)

## 2018-03-30 NOTE — Telephone Encounter (Signed)
Lab orders done. 

## 2018-03-31 ENCOUNTER — Encounter: Payer: BLUE CROSS/BLUE SHIELD | Admitting: Family Medicine

## 2018-04-02 ENCOUNTER — Other Ambulatory Visit: Payer: Self-pay | Admitting: Internal Medicine

## 2018-04-03 ENCOUNTER — Encounter: Payer: Self-pay | Admitting: Family Medicine

## 2018-04-03 ENCOUNTER — Ambulatory Visit (INDEPENDENT_AMBULATORY_CARE_PROVIDER_SITE_OTHER): Payer: BLUE CROSS/BLUE SHIELD | Admitting: Family Medicine

## 2018-04-03 VITALS — BP 140/80 | HR 77 | Temp 98.4°F | Ht 65.0 in | Wt 201.8 lb

## 2018-04-03 DIAGNOSIS — F418 Other specified anxiety disorders: Secondary | ICD-10-CM

## 2018-04-03 DIAGNOSIS — E7800 Pure hypercholesterolemia, unspecified: Secondary | ICD-10-CM

## 2018-04-03 DIAGNOSIS — Z23 Encounter for immunization: Secondary | ICD-10-CM

## 2018-04-03 DIAGNOSIS — E6609 Other obesity due to excess calories: Secondary | ICD-10-CM

## 2018-04-03 DIAGNOSIS — R7303 Prediabetes: Secondary | ICD-10-CM

## 2018-04-03 DIAGNOSIS — Z0000 Encounter for general adult medical examination without abnormal findings: Secondary | ICD-10-CM | POA: Diagnosis not present

## 2018-04-03 DIAGNOSIS — Z6833 Body mass index (BMI) 33.0-33.9, adult: Secondary | ICD-10-CM

## 2018-04-03 DIAGNOSIS — B029 Zoster without complications: Secondary | ICD-10-CM

## 2018-04-03 DIAGNOSIS — D6851 Activated protein C resistance: Secondary | ICD-10-CM

## 2018-04-03 MED ORDER — SIMVASTATIN 20 MG PO TABS: 20.0000 mg | ORAL_TABLET | Freq: Every day | ORAL | 3 refills | Status: DC

## 2018-04-03 MED ORDER — BUPROPION HCL ER (XL) 150 MG PO TB24: ORAL_TABLET | ORAL | 3 refills | Status: DC

## 2018-04-03 MED ORDER — ALPRAZOLAM 0.25 MG PO TABS: 0.2500 mg | ORAL_TABLET | Freq: Every evening | ORAL | 0 refills | Status: DC | PRN

## 2018-04-03 MED ORDER — CYCLOBENZAPRINE HCL 10 MG PO TABS: 5.0000 mg | ORAL_TABLET | Freq: Three times a day (TID) | ORAL | 5 refills | Status: DC | PRN

## 2018-04-03 MED ORDER — ESOMEPRAZOLE MAGNESIUM 40 MG PO CPDR: 40.0000 mg | DELAYED_RELEASE_CAPSULE | Freq: Every day | ORAL | 3 refills | Status: DC | PRN

## 2018-04-03 MED ORDER — FLUTICASONE PROPIONATE 50 MCG/ACT NA SUSP: NASAL | 3 refills | Status: AC

## 2018-04-03 NOTE — Patient Instructions (Addendum)
We will send for last pap report   Tdap vaccine today   Get back to walking  Keep up the healthy diet

## 2018-04-03 NOTE — Progress Notes (Signed)
Subjective:    Patient ID: Kristine [name], female    DOB: Feb 17, 1956, 62 y.o.   MRN: [id number]  HPI Here for health maintenance exam and to review chronic medical problems    Getting over shingles   Very stressed - tried to leave job but they begged her to stay  Part time now  Leaving in January   Some issues with her shoulder L (not the one she was operated on)  Worse pain since she had shingles  Limited rom - abduction (? If spur like other one)  Using it less  Will see Dr Latanya Maudlin on Monday     Wt Readings from Last 3 Encounters:  04/03/18 201 lb 12 oz (91.5 kg)  12/02/17 206 lb 6.4 oz (93.6 kg)  06/30/17 201 lb 12 oz (91.5 kg)  wt down 5 lb  Walking this week- 2 mi so far - will have more time for self care  Eating very well now (cut back on bread)  33.57 kg/m   Tetanus shot 7/09 Will do that today   Mammogram 2/19   (does mammo /mri every other year)-in [facility]  Self breast exam -no lumps  Sister had breast cancer  Pap 5/19 neg- gyn/ Dr Helane Rima  Colonoscopy 1/13-10y   Hep C and HIV screen utd  Flu shot 10/19   Zoster vaccine 10/17  Had shingles /mild 9/19 -not completely gone, had another spot come up  Still some itching  Still quite fatigued   Blood pressure BP Readings from Last 3 Encounters:  04/03/18 (!) 144/76  01/21/18 (!) 142/94  12/02/17 130/78  stressed this am  BP: 140/80  Improved on 2nd check after sitting    Pulse Readings from Last 3 Encounters:  04/03/18 77  01/21/18 62  12/02/17 65   Hyperlipidemia Lab Results  Component Value Date   CHOL 148 03/30/2018   CHOL 137 03/03/2017   CHOL 143 07/19/2015   Lab Results  Component Value Date   HDL 60.50 03/30/2018   HDL 59.40 03/03/2017   HDL 57.80 07/19/2015   Lab Results  Component Value Date   LDLCALC 68 03/30/2018   LDLCALC 64 03/03/2017   LDLCALC 70 07/19/2015   Lab Results  Component Value Date   TRIG 97.0 03/30/2018   TRIG 68.0 03/03/2017   TRIG 77.0  07/19/2015   Lab Results  Component Value Date   CHOLHDL 2 03/30/2018   CHOLHDL 2 03/03/2017   CHOLHDL 2 07/19/2015   Lab Results  Component Value Date   LDLDIRECT 146.1 08/19/2006   Simvastatin and diet -very good control   Prediabetes Lab Results  Component Value Date   HGBA1C 5.9 03/30/2018  last was 5.8  Eating well / less bread  Lab Results  Component Value Date   CREATININE 0.76 03/30/2018   BUN 16 03/30/2018   NA 139 03/30/2018   K 3.8 03/30/2018   CL 105 03/30/2018   CO2 27 03/30/2018   Lab Results  Component Value Date   ALT 13 03/30/2018   AST 14 03/30/2018   ALKPHOS 56 03/30/2018   BILITOT 0.6 03/30/2018   Lab Results  Component Value Date   WBC 5.1 03/30/2018   HGB 13.7 03/30/2018   HCT 41.0 03/30/2018   MCV 86.2 03/30/2018   PLT 179.0 03/30/2018   Lab Results  Component Value Date   TSH 2.05 03/30/2018    Patient Active Problem List   Diagnosis Date Noted  . Zoster 01/21/2018  .  Need for hepatitis C screening test 12/27/2015  . Prediabetes 04/21/2015  . Stress reaction 04/21/2015  . Elevated BP 04/21/2015  . Cough 10/27/2013  . Abdominal pain, other specified site 08/26/2013  . Migraine with aura 06/21/2013  . Pelvic mass in female 09/01/2012  . Obesity 06/16/2011  . Rhinitis 06/14/2011  . Routine general medical examination at a health care facility 06/14/2011  . SCIATICA 05/22/2009  . GASTROESOPHAGEAL REFLUX DISEASE 04/14/2009  . CONGENITAL DEFICIENCY OF OTHER CLOTTING FACTORS 12/07/2007  . ANXIETY 11/25/2006  . Depression with anxiety 11/25/2006  . ESOPHAGITIS 11/25/2006  . IBS 11/25/2006  . DVT, HX OF 11/25/2006  . MIGRAINES, HX OF 11/25/2006  . HYPERCHOLESTEROLEMIA, PURE 10/29/2006   Past Medical History:  Diagnosis Date  . Anxiety   . Basal cell cancer   . Basal cell carcinoma    face  . Depression   . DVT (deep venous thrombosis) ([place]) 1/08  . Esophageal erosions   . Factor V Leiden ([place])    On coag x 1 year  ending in 09  . Gastritis 1990   hospital- abd pain  . GERD (gastroesophageal reflux disease)   . Radial head fracture 2011   Past Surgical History:  Procedure Laterality Date  . CHOLECYSTECTOMY    . COLONOSCOPY    . MOHS SURGERY  7/11   basal cell carcinoma near eye  . OOPHORECTOMY    . SHOULDER SURGERY  04/2006  . TONSILLECTOMY  1976  . UPPER GASTROINTESTINAL ENDOSCOPY     Social History   Tobacco Use  . Smoking status: Never Smoker  . Smokeless tobacco: Never Used  Substance Use Topics  . Alcohol use: Yes    Alcohol/week: 0.0 standard drinks    Comment: occ  . Drug use: No   Family History  Problem Relation Age of Onset  . Graves' disease Mother   . Depression Mother   . Osteoporosis Mother   . Heart attack Father        x 3   . Breast cancer Sister   . Heart attack Brother        x 2  . Diabetes Brother   . Hypertension Brother   . Hypertension Sister   . Coronary artery disease Brother        with stents   . Atrial fibrillation Brother   . Colon cancer Neg Hx    Allergies  Allergen Reactions  . Amoxicillin-Pot Clavulanate     REACTION: GI  . Calcium     REACTION: muscle aches  . Erythromycin     REACTION: GI  . Guaifenesin     REACTION: GI  . Naproxen     REACTION: GI  . Oxycodone-Acetaminophen     REACTION: reaction not known  . Oxycodone-Acetaminophen Other (See Comments)    Other Reaction: Not Assessed  . Oxycodone-Acetaminophen Other (See Comments)    Other Reaction: Not Assessed  . Oxycodone-Aspirin     REACTION: reaction not known  . Sertraline Other (See Comments)    Other Reaction: Not Assessed  . Sertraline Hcl     REACTION: hives   Current Outpatient Medications on File Prior to Visit  Medication Sig Dispense Refill  . acetaminophen (TYLENOL) 500 MG tablet Take 500 mg by mouth every 6 (six) hours as needed.      Marland Kitchen acyclovir ointment (ZOVIRAX) 5 % Apply 1 application topically every 3 (three) hours. To cold sore as needed 5 g 3    . albuterol (PROVENTIL  HFA;VENTOLIN HFA) 108 (90 Base) MCG/ACT inhaler INHALE 1-2 PUFFS INTO THE LUNGS EVERY 6 (SIX) HOURS AS NEEDED FOR WHEEZING OR SHORTNESS OF BREATH. 8.5 Inhaler 3  . aspirin 81 MG tablet Take 81 mg by mouth daily.      . fexofenadine (ALLEGRA) 180 MG tablet Take 180 mg by mouth daily as needed. Reported on 05/19/2015    . furosemide (LASIX) 20 MG tablet Take 1 tablet (20 mg total) by mouth 2 (two) times a week. Take with potassium 30 tablet 3  . loratadine (CLARITIN) 10 MG tablet Take 10 mg by mouth as needed for allergies. Reported on 05/19/2015    . metoprolol succinate (TOPROL-XL) 25 MG 24 hr tablet TAKE 1 TABLET BY MOUTH EVERY DAY 90 tablet 2  . NON FORMULARY PLEXUS: DIETARY SUPPLEMENT    . NON FORMULARY PLEXUS: BIO CLEANSE    . NON FORMULARY PLEXUS: PRO BIO 5    . potassium chloride (K-DUR) 10 MEQ tablet Take 1 tablet (10 mEq total) by mouth 2 (two) times a week. 30 tablet 3   No current facility-administered medications on file prior to visit.     Review of Systems  Constitutional: Negative for activity change, appetite change, fatigue, fever and unexpected weight change.  HENT: Negative for congestion, ear pain, rhinorrhea, sinus pressure and sore throat.   Eyes: Negative for pain, redness and visual disturbance.  Respiratory: Negative for cough, shortness of breath and wheezing.   Cardiovascular: Negative for chest pain and palpitations.  Gastrointestinal: Negative for abdominal pain, blood in stool, constipation and diarrhea.  Endocrine: Negative for polydipsia and polyuria.  Genitourinary: Negative for dysuria, frequency and urgency.  Musculoskeletal: Negative for arthralgias, back pain and myalgias.       L shoulder pain    Skin: Negative for pallor and rash.       Pain from shingles  Allergic/Immunologic: Negative for environmental allergies.  Neurological: Negative for dizziness, syncope and headaches.  Hematological: Negative for adenopathy. Does not  bruise/bleed easily.  Psychiatric/Behavioral: Negative for decreased concentration and dysphoric mood. The patient is nervous/anxious.        Stressors       Objective:   Physical Exam  Constitutional: She appears well-developed and well-nourished. No distress.  obese and well appearing   HENT:  Head: Normocephalic and atraumatic.  Right Ear: External ear normal.  Left Ear: External ear normal.  Nose: Nose normal.  Mouth/Throat: Oropharynx is clear and moist.  Eyes: Pupils are equal, round, and reactive to light. Conjunctivae and EOM are normal. Right eye exhibits no discharge. Left eye exhibits no discharge. No scleral icterus.  Neck: Normal range of motion. Neck supple. No JVD present. Carotid bruit is not present. No thyromegaly present.  Cardiovascular: Normal rate, regular rhythm, normal heart sounds and intact distal pulses. Exam reveals no gallop.  Pulmonary/Chest: Effort normal and breath sounds normal. No respiratory distress. She has no wheezes. She has no rales.  Abdominal: Soft. Bowel sounds are normal. She exhibits no distension and no mass. There is no tenderness.  Genitourinary:  Genitourinary Comments: Gyn sees pt for breast and pelvic exams  Musculoskeletal: She exhibits no edema or tenderness.  Lymphadenopathy:    She has no cervical adenopathy.  Neurological: She is alert. She has normal reflexes. She displays normal reflexes. No cranial nerve deficit. She exhibits normal muscle tone. Coordination normal.  Skin: Skin is warm and dry. No rash noted. No erythema. No pallor.  Solar lentigines diffusely   Psychiatric: She has  a normal mood and affect.  Pleasant           Assessment & Plan:   Problem List Items Addressed This Visit      Other   Zoster    Rash improved Some post herpetic neuralgia      Routine general medical examination at a health care facility - Primary    Reviewed health habits including diet and exercise and skin cancer  prevention Reviewed appropriate screening tests for age  Also reviewed health mt list, fam hx and immunization status , as well as social and family history   See HPI Labs reviewed  Enc more self care now that work is going to slow down  Continue to watch bp Gyn records sent for /pap result  Tdap vaccine today      Prediabetes    Lab Results  Component Value Date   HGBA1C 5.9 03/30/2018   disc imp of low glycemic diet and wt loss to prevent DM2       Obesity    Discussed how this problem influences overall health and the risks it imposes  Reviewed plan for weight loss with lower calorie diet (via better food choices and also portion control or program like weight watchers) and exercise building up to or more than 30 minutes 5 days per week including some aerobic activity         HYPERCHOLESTEROLEMIA, PURE    Disc goals for lipids and reasons to control them Rev last labs with pt Rev low sat fat diet in detail Well controlled with simvastatin and diet       Relevant Medications   simvastatin (ZOCOR) 20 MG tablet   Depression with anxiety    Continues wellbutrin  Stressors should improve with decreased work hours and eventual retirement        Relevant Medications   buPROPion (WELLBUTRIN XL) 150 MG 24 hr tablet   ALPRAZolam (XANAX) 0.25 MG tablet    Other Visit Diagnoses    Need for Tdap vaccination       Relevant Orders   Tdap vaccine greater than or equal to 7yo IM (Completed)   Factor 5 Leiden mutation, heterozygous ([place])   (Chronic)

## 2018-04-05 NOTE — Assessment & Plan Note (Signed)
Discussed how this problem influences overall health and the risks it imposes  Reviewed plan for weight loss with lower calorie diet (via better food choices and also portion control or program like weight watchers) and exercise building up to or more than 30 minutes 5 days per week including some aerobic activity    

## 2018-04-05 NOTE — Assessment & Plan Note (Signed)
Rash improved Some post herpetic neuralgia

## 2018-04-05 NOTE — Assessment & Plan Note (Signed)
No clinical changes 

## 2018-04-05 NOTE — Assessment & Plan Note (Signed)
Lab Results  Component Value Date   HGBA1C 5.9 03/30/2018   disc imp of low glycemic diet and wt loss to prevent DM2

## 2018-04-05 NOTE — Assessment & Plan Note (Signed)
Disc goals for lipids and reasons to control them Rev last labs with pt Rev low sat fat diet in detail Well controlled with simvastatin and diet  

## 2018-04-05 NOTE — Assessment & Plan Note (Signed)
Continues wellbutrin  Stressors should improve with decreased work hours and eventual retirement

## 2018-04-05 NOTE — Assessment & Plan Note (Signed)
Reviewed health habits including diet and exercise and skin cancer prevention Reviewed appropriate screening tests for age  Also reviewed health mt list, fam hx and immunization status , as well as social and family history   See HPI Labs reviewed  Enc more self care now that work is going to slow down  Continue to watch bp Gyn records sent for /pap result  Tdap vaccine today

## 2018-04-05 NOTE — Assessment & Plan Note (Signed)
Improved on 2nd check after resting  Continue to follow-may need medication eventually  Enc her to work on diet/exercise and wt loss

## 2018-04-07 ENCOUNTER — Telehealth: Payer: Self-pay | Admitting: Internal Medicine

## 2018-04-07 NOTE — Telephone Encounter (Signed)
New Message   Pt wants to know if she needs to set up her echo since her appt had to be moved to Feb. Please call

## 2018-04-07 NOTE — Telephone Encounter (Signed)
Spoke with scheduler.  Pt scheduled for 04/27/18. Pt aware.

## 2018-04-27 ENCOUNTER — Encounter

## 2018-04-27 ENCOUNTER — Encounter: Payer: Self-pay | Admitting: Internal Medicine

## 2018-04-27 ENCOUNTER — Ambulatory Visit: Payer: BLUE CROSS/BLUE SHIELD | Admitting: Internal Medicine

## 2018-04-27 ENCOUNTER — Ambulatory Visit (INDEPENDENT_AMBULATORY_CARE_PROVIDER_SITE_OTHER): Payer: BLUE CROSS/BLUE SHIELD | Admitting: Internal Medicine

## 2018-04-27 VITALS — BP 140/96 | HR 64 | Ht 65.0 in | Wt 202.4 lb

## 2018-04-27 DIAGNOSIS — I10 Essential (primary) hypertension: Secondary | ICD-10-CM

## 2018-04-27 DIAGNOSIS — R931 Abnormal findings on diagnostic imaging of heart and coronary circulation: Secondary | ICD-10-CM

## 2018-04-27 DIAGNOSIS — R002 Palpitations: Secondary | ICD-10-CM

## 2018-04-27 MED ORDER — FUROSEMIDE 20 MG PO TABS: 20.0000 mg | ORAL_TABLET | ORAL | 3 refills | Status: DC

## 2018-04-27 MED ORDER — POTASSIUM CHLORIDE ER 10 MEQ PO TBCR: 10.0000 meq | EXTENDED_RELEASE_TABLET | ORAL | 3 refills | Status: DC

## 2018-04-27 NOTE — Progress Notes (Addendum)
Cardiology Office Note   Date:  04/27/2018   ID:  Kristine, [name] 02/24/1956, MRN [id number]  PCP:  Abner Greenspan, MD  Cardiologist:   Dorris Carnes, MD   Pt presents for f/u of echo     History of Present Illness: Kristine [name] is a 62 y.o. female with a history of palpitatoins   Echo norma in 2017  Myovue normal  Rx toprol  I saw the pt in July 2019    She had echo done LVEF and RVEF were normal RV and RA normal in sze  PAP 47   IVC dilated I recomm lasix for LE edema    She is taking it a couple times per wk  Since seen she says she has felt a lot better   No significant edema  She says she is active and her breathing is OK   Denies CP    Pt did not take meds yet  Outpatient Medications Prior to Visit  Medication Sig Dispense Refill  . acetaminophen (TYLENOL) 500 MG tablet Take 500 mg by mouth every 6 (six) hours as needed.      Marland Kitchen acyclovir ointment (ZOVIRAX) 5 % Apply 1 application topically every 3 (three) hours. To cold sore as needed 5 g 3  . albuterol (PROVENTIL HFA;VENTOLIN HFA) 108 (90 Base) MCG/ACT inhaler INHALE 1-2 PUFFS INTO THE LUNGS EVERY 6 (SIX) HOURS AS NEEDED FOR WHEEZING OR SHORTNESS OF BREATH. 8.5 Inhaler 3  . ALPRAZolam (XANAX) 0.25 MG tablet Take 1 tablet (0.25 mg total) by mouth at bedtime as needed for anxiety. 30 tablet 0  . aspirin 81 MG tablet Take 81 mg by mouth daily.      Marland Kitchen buPROPion (WELLBUTRIN XL) 150 MG 24 hr tablet TAKE 1 TABLET (150 MG TOTAL) BY MOUTH DAILY. 90 tablet 3  . cyclobenzaprine (FLEXERIL) 10 MG tablet Take 0.5 tablets (5 mg total) by mouth 3 (three) times daily as needed for muscle spasms. 30 tablet 5  . esomeprazole (NEXIUM) 40 MG capsule Take 1 capsule (40 mg total) by mouth daily as needed. HEARTBURN 90 capsule 3  . fexofenadine (ALLEGRA) 180 MG tablet Take 180 mg by mouth daily as needed. Reported on 05/19/2015    . fluticasone (FLONASE) 50 MCG/ACT nasal spray PLACE 2 SPRAYS INTO THE NOSE DAILY AS NEEDED 48 g 3  .  furosemide (LASIX) 20 MG tablet Take 1 tablet (20 mg total) by mouth 2 (two) times a week. Take with potassium 30 tablet 3  . loratadine (CLARITIN) 10 MG tablet Take 10 mg by mouth as needed for allergies. Reported on 05/19/2015    . metoprolol succinate (TOPROL-XL) 25 MG 24 hr tablet TAKE 1 TABLET BY MOUTH EVERY DAY 90 tablet 2  . NON FORMULARY PLEXUS: DIETARY SUPPLEMENT    . NON FORMULARY PLEXUS: BIO CLEANSE    . NON FORMULARY PLEXUS: PRO BIO 5    . potassium chloride (K-DUR) 10 MEQ tablet Take 1 tablet (10 mEq total) by mouth 2 (two) times a week. 30 tablet 3  . simvastatin (ZOCOR) 20 MG tablet Take 1 tablet (20 mg total) by mouth daily. 90 tablet 3   No facility-administered medications prior to visit.      Allergies:   Amoxicillin-pot clavulanate; Calcium; Erythromycin; Guaifenesin; Naproxen; Oxycodone-acetaminophen; Oxycodone-acetaminophen; Oxycodone-acetaminophen; Oxycodone-aspirin; Sertraline; and Sertraline hcl   Past Medical History:  Diagnosis Date  . Anxiety   . Basal cell cancer   . Basal cell carcinoma  face  . Depression   . DVT (deep venous thrombosis) ([place]) 1/08  . Esophageal erosions   . Factor V Leiden ([place])    On coag x 1 year ending in 09  . Gastritis 1990   hospital- abd pain  . GERD (gastroesophageal reflux disease)   . Radial head fracture 2011    Past Surgical History:  Procedure Laterality Date  . CHOLECYSTECTOMY    . COLONOSCOPY    . MOHS SURGERY  7/11   basal cell carcinoma near eye  . OOPHORECTOMY    . SHOULDER SURGERY  04/2006  . TONSILLECTOMY  1976  . UPPER GASTROINTESTINAL ENDOSCOPY       Social History:  The patient  reports that she has never smoked. She has never used smokeless tobacco. She reports current alcohol use. She reports that she does not use drugs.   Family History:  The patient's family history includes Atrial fibrillation in her brother; Breast cancer in her sister; Coronary artery disease in her brother; Depression in her  mother; Diabetes in her brother; Berenice Primas' disease in her mother; Heart attack in her brother and father; Hypertension in her brother and sister; Osteoporosis in her mother.    ROS:  Please see the history of present illness. All other systems are reviewed and  Negative to the above problem except as noted.    PHYSICAL EXAM: VS:  BP (!) 140/96   Pulse 64   Ht 5\' 5"  (1.651 m)   Wt 202 lb 6.4 oz (91.8 kg)   BMI 33.68 kg/m   GEN: Obese 62 yo , in no acute distress  HEENT: normal  Neck: no JVD, carotid bruits, or masses Cardiac: RRR; no murmurs, rubs, or gallops,  No  LE edema  Respiratory:  clear to auscultation bilaterally, normal work of breathing GI: soft, nontender, nondistended, + BS  No hepatomegaly  MS: no deformity Moving all extremities   Skin: warm and dry, no rash Neuro:  Strength and sensation are intact Psych: euthymic mood, full affect   EKG:  EKG is  ordered today  SR 64 bpm   First degree AV block  PR 208 msec   Incomp RBBB   Lipid Panel    Component Value Date/Time   CHOL 148 03/30/2018 0923   TRIG 97.0 03/30/2018 0923   HDL 60.50 03/30/2018 0923   CHOLHDL 2 03/30/2018 0923   VLDL 19.4 03/30/2018 0923   LDLCALC 68 03/30/2018 0923   LDLDIRECT 146.1 08/19/2006 0935      Wt Readings from Last 3 Encounters:  04/27/18 202 lb 6.4 oz (91.8 kg)  04/03/18 201 lb 12 oz (91.5 kg)  12/02/17 206 lb 6.4 oz (93.6 kg)      ASSESSMENT AND PLAN:  1   Abnormal echo   Pt feelin gbetter   No edema   I would keep on current regimen    Check echo after the holidays to evaluate PAP and IVC     2 Palpiatoins Pt denies   2  BP  BP is up   She did not take meds yet     Current medicines are reviewed at length with the patient today.  The patient does not have concerns regarding medicines.  Signed, Dorris Carnes, MD  04/27/2018 10:04 AM    [address], [place], [place]  [address] Phone: [phone]; Fax: [phone]

## 2018-04-27 NOTE — Patient Instructions (Signed)
Medication Instructions:  No changes If you need a refill on your cardiac medications before your next appointment, please call your pharmacy.   Lab work: none If you have labs (blood work) drawn today and your tests are completely normal, you will receive your results only by: Marland Kitchen MyChart Message (if you have MyChart) OR . A paper copy in the mail If you have any lab test that is abnormal or we need to change your treatment, we will call you to review the results.  Testing/Procedures: Your physician has requested that you have an echocardiogram. Echocardiography is a painless test that uses sound waves to create images of your heart. It provides your doctor with information about the size and shape of your heart and how well your heart's chambers and valves are working. This procedure takes approximately one hour. There are no restrictions for this procedure. Limited echo, due in February, Vanessa to do if possible, please.   Follow-Up: At [facility], you and your health needs are our priority.  As part of our continuing mission to provide you with exceptional heart care, we have created designated Provider Care Teams.  These Care Teams include your primary Cardiologist (physician) and Advanced Practice Providers (APPs -  Physician Assistants and Nurse Practitioners) who all work together to provide you with the care you need, when you need it. You will need a follow up appointment in:  9 months.  Please call our office 2 months in advance to schedule this appointment.  You may see Dr. Harrington Challenger or one of the following Advanced Practice Providers on your designated Care Team: Richardson Dopp, PA-C [place], [place] . Daune Perch, NP  Any Other Special Instructions Will Be Listed Below (If Applicable).

## 2018-05-15 DIAGNOSIS — J0140 Acute pansinusitis, unspecified: Secondary | ICD-10-CM | POA: Diagnosis not present

## 2018-05-15 DIAGNOSIS — I10 Essential (primary) hypertension: Secondary | ICD-10-CM | POA: Diagnosis not present

## 2018-05-19 DIAGNOSIS — M7502 Adhesive capsulitis of left shoulder: Secondary | ICD-10-CM | POA: Diagnosis not present

## 2018-05-22 DIAGNOSIS — M7502 Adhesive capsulitis of left shoulder: Secondary | ICD-10-CM | POA: Diagnosis not present

## 2018-05-26 DIAGNOSIS — M7502 Adhesive capsulitis of left shoulder: Secondary | ICD-10-CM | POA: Diagnosis not present

## 2018-05-29 DIAGNOSIS — M7502 Adhesive capsulitis of left shoulder: Secondary | ICD-10-CM | POA: Diagnosis not present

## 2018-06-02 DIAGNOSIS — M7502 Adhesive capsulitis of left shoulder: Secondary | ICD-10-CM | POA: Diagnosis not present

## 2018-06-04 DIAGNOSIS — M7502 Adhesive capsulitis of left shoulder: Secondary | ICD-10-CM | POA: Diagnosis not present

## 2018-06-09 DIAGNOSIS — M7502 Adhesive capsulitis of left shoulder: Secondary | ICD-10-CM | POA: Diagnosis not present

## 2018-06-11 DIAGNOSIS — M7502 Adhesive capsulitis of left shoulder: Secondary | ICD-10-CM | POA: Diagnosis not present

## 2018-06-12 DIAGNOSIS — H01023 Squamous blepharitis right eye, unspecified eyelid: Secondary | ICD-10-CM | POA: Diagnosis not present

## 2018-06-12 DIAGNOSIS — H35372 Puckering of macula, left eye: Secondary | ICD-10-CM | POA: Diagnosis not present

## 2018-06-12 DIAGNOSIS — H524 Presbyopia: Secondary | ICD-10-CM | POA: Diagnosis not present

## 2018-06-15 DIAGNOSIS — M7502 Adhesive capsulitis of left shoulder: Secondary | ICD-10-CM | POA: Diagnosis not present

## 2018-06-22 DIAGNOSIS — M7502 Adhesive capsulitis of left shoulder: Secondary | ICD-10-CM | POA: Diagnosis not present

## 2018-06-24 DIAGNOSIS — M7502 Adhesive capsulitis of left shoulder: Secondary | ICD-10-CM | POA: Diagnosis not present

## 2018-06-29 ENCOUNTER — Other Ambulatory Visit (HOSPITAL_COMMUNITY): Payer: BLUE CROSS/BLUE SHIELD

## 2018-06-29 DIAGNOSIS — Z803 Family history of malignant neoplasm of breast: Secondary | ICD-10-CM | POA: Diagnosis not present

## 2018-06-29 DIAGNOSIS — Z1231 Encounter for screening mammogram for malignant neoplasm of breast: Secondary | ICD-10-CM | POA: Diagnosis not present

## 2018-06-29 LAB — HM MAMMOGRAPHY

## 2018-07-01 DIAGNOSIS — M7502 Adhesive capsulitis of left shoulder: Secondary | ICD-10-CM | POA: Diagnosis not present

## 2018-07-06 ENCOUNTER — Ambulatory Visit: Payer: BLUE CROSS/BLUE SHIELD | Admitting: Internal Medicine

## 2018-07-14 ENCOUNTER — Ambulatory Visit (HOSPITAL_COMMUNITY): Payer: 59 | Attending: Cardiovascular Disease

## 2018-07-14 DIAGNOSIS — R931 Abnormal findings on diagnostic imaging of heart and coronary circulation: Secondary | ICD-10-CM

## 2018-07-23 ENCOUNTER — Telehealth: Payer: Self-pay | Admitting: *Deleted

## 2018-07-23 DIAGNOSIS — R0683 Snoring: Secondary | ICD-10-CM

## 2018-07-23 NOTE — Telephone Encounter (Signed)
-----   Message from Fay Records, MD sent at 07/20/2018  3:46 PM EDT ----- I called pt to reviewed Echo report that she saw had information from previous echo in conclusion IVC is normal PAP is not the same as was 47 mm   Improved R sided heart chambers normal  Recomm:  1.   Will review to have report amended  Please forward to echo tech to review 2Pt does say her husband tells her she snores a lot   WIll set up for sleep study

## 2018-07-23 NOTE — Telephone Encounter (Signed)
Order placed for sleep study. Forwarded echo to E. Nathaneil Canary, Echo Dept.

## 2018-07-23 NOTE — Telephone Encounter (Signed)
-----   Message from Rodman Key, RN sent at 07/23/2018  5:32 PM EDT ----- Regarding: sleep study Pt has been ordered sleep study.  I believe the Epsworth scale was completed by Elwyn Reach, RN. Thank you.Michalene

## 2018-07-24 ENCOUNTER — Telehealth (HOSPITAL_COMMUNITY): Payer: Self-pay | Admitting: Radiology

## 2018-07-24 NOTE — Telephone Encounter (Signed)
Telephone note opened in error

## 2018-07-24 NOTE — Telephone Encounter (Signed)
PAP of 47 mmHg was reported as history from most recent previous echo not as impressions for this current report. The impressions from this report shows PAP of 33.9 mmHg which is correct. I can have Dr. Sallyanne Kuster amend the report not to show previous PAP but it will still be listed as such on the previous report.

## 2018-07-25 ENCOUNTER — Other Ambulatory Visit: Payer: Self-pay | Admitting: Internal Medicine

## 2018-07-27 ENCOUNTER — Telehealth: Payer: Self-pay | Admitting: *Deleted

## 2018-07-27 NOTE — Telephone Encounter (Signed)
Routed to Dr. Harrington Challenger

## 2018-07-27 NOTE — Telephone Encounter (Signed)
-----   Message from Lauralee Evener, [place] sent at 07/24/2018 10:20 AM EDT ----- Regarding: RE: sleep study This cannot be ordered without patient having office visit. Symptoms has to be documented by a MD or mid level in a office note. If this has not been done she will need OV for assessment. Thanks. ----- Message ----- From: Rodman Key, RN Sent: 07/23/2018   5:32 PM EDT To: Freada Bergeron, CMA, [facility] Sleep Studies Subject: sleep study                                    Pt has been ordered sleep study.  I believe the Epsworth scale was completed by Elwyn Reach, RN. Thank you.Riannon Mukherjee

## 2018-07-27 NOTE — Telephone Encounter (Signed)
Left detailed message on confidential self identified VM that pt will need ov prior to being able to have sleep study approved.   Adv if she has appt set up with PCP they may be able to order/arrange.  Otherwise, Dr. Harrington Challenger or APP can see her.

## 2018-07-29 NOTE — Telephone Encounter (Signed)
I spoke with pt on phone about echo which is improved We discussed snoring.    I think she should discuss with PCP if referral is needed instead of another appt in cardiology

## 2018-08-05 ENCOUNTER — Telehealth: Payer: Self-pay | Admitting: *Deleted

## 2018-08-05 MED ORDER — ACYCLOVIR 5 % EX OINT: 1.0000 "application " | TOPICAL_OINTMENT | CUTANEOUS | 3 refills | Status: DC

## 2018-08-05 MED ORDER — VALACYCLOVIR HCL 1 G PO TABS: 2000.0000 mg | ORAL_TABLET | Freq: Two times a day (BID) | ORAL | 1 refills | Status: DC

## 2018-08-05 NOTE — Addendum Note (Signed)
Addended by: Loura Pardon A on: 08/05/2018 04:01 PM   Modules accepted: Orders

## 2018-08-05 NOTE — Telephone Encounter (Signed)
Patient advised.

## 2018-08-05 NOTE — Telephone Encounter (Signed)
Refill was sent in for Zovirax ointment earlier today. Insurance requesting PA to be done. PA completed and it went for review to insurance. In the side notes on cover my meds this was the message from insurance "PA Required call [phone]. Preferred medications: acyclovir capsule/tablet,famciclovir  tablet, valacyclovir tablet." Spoke with patient and advised that insurance is preferring to cover tablets/capsules instead of ointment but we can wait to see what the verdict is for ointment once the insurance reviews PA information I submitted or we can go ahead and switch to oral medication if Dr.Tower thinks it is appropriate. Patient was agreeable to that if needed. She has never tried tablet form medication for cold sores. Her dermatologist has been prescribing ointment since like 2013 before Dr.Tower took over this for her. She has tried OTC ointments before with no relief. Please review. How would you like to proceed?

## 2018-08-05 NOTE — Telephone Encounter (Signed)
Patient left a voicemail stating that she woke up this morning with a big fever blister on her lip. Patient stated that the prescription that she has expired. Patient requested that the script be sent to [facility] Last office visit 04/03/18 Last refill 04/08/16  5 G/3 refills

## 2018-08-05 NOTE — Telephone Encounter (Signed)
I sent valcyclovir tabs- should work better anyway Take 2 pills twice daily for one day for a cold sore outbreak

## 2018-08-07 NOTE — Telephone Encounter (Signed)
Med was already switched to tablets per prev note but I did receive the final verdict from insurance and they did deny the PA for the ointment. Letter placed in Dr. Marliss Coots inbox to review and sign and send for scanning

## 2019-01-30 ENCOUNTER — Other Ambulatory Visit: Payer: Self-pay | Admitting: Internal Medicine

## 2019-03-02 ENCOUNTER — Ambulatory Visit (INDEPENDENT_AMBULATORY_CARE_PROVIDER_SITE_OTHER): Payer: 59

## 2019-03-02 DIAGNOSIS — Z23 Encounter for immunization: Secondary | ICD-10-CM

## 2019-04-04 NOTE — Progress Notes (Signed)
Cardiology Office Note   Date:  04/05/2019   ID:  Chalsea, Mcbrayer March 20, 1956, MRN [id number]  PCP:  Abner Greenspan, MD  Cardiologist:   Dorris Carnes, MD   Pt presents for f/u of echo     History of Present Illness: Kristine [name] is a 63 y.o. female with a history of palpitatoins   Echo normal in 2017  Myovue normal  Rx toprol   She had echo done 2019  LVEF and RVEF were normal RV and RA normal in sze  PAP 47   IVC dilated I recomm lasix for LE edema    She is taking it a couple times per wk Repeat echo IVC normalized and PAP improved    She watched salt    I saw the pt in Dec 2019  The patient deneis SOB   No chest pressure   No signif LE edema  Feeling good    Outpatient Medications Prior to Visit  Medication Sig Dispense Refill  . acetaminophen (TYLENOL) 500 MG tablet Take 500 mg by mouth every 6 (six) hours as needed.      Marland Kitchen acyclovir ointment (ZOVIRAX) 5 % Apply 1 application topically every 3 (three) hours. To cold sore as needed 5 g 3  . albuterol (PROVENTIL HFA;VENTOLIN HFA) 108 (90 Base) MCG/ACT inhaler INHALE 1-2 PUFFS INTO THE LUNGS EVERY 6 (SIX) HOURS AS NEEDED FOR WHEEZING OR SHORTNESS OF BREATH. 8.5 Inhaler 3  . ALPRAZolam (XANAX) 0.25 MG tablet Take 1 tablet (0.25 mg total) by mouth at bedtime as needed for anxiety. 30 tablet 0  . aspirin 81 MG tablet Take 81 mg by mouth daily.      Marland Kitchen buPROPion (WELLBUTRIN XL) 150 MG 24 hr tablet TAKE 1 TABLET (150 MG TOTAL) BY MOUTH DAILY. 90 tablet 3  . cyclobenzaprine (FLEXERIL) 10 MG tablet Take 0.5 tablets (5 mg total) by mouth 3 (three) times daily as needed for muscle spasms. 30 tablet 5  . esomeprazole (NEXIUM) 40 MG capsule Take 1 capsule (40 mg total) by mouth daily as needed. HEARTBURN 90 capsule 3  . fexofenadine (ALLEGRA) 180 MG tablet Take 180 mg by mouth daily as needed. Reported on 05/19/2015    . fluticasone (FLONASE) 50 MCG/ACT nasal spray PLACE 2 SPRAYS INTO THE NOSE DAILY AS NEEDED 48 g 3  .  furosemide (LASIX) 20 MG tablet TAKE 1 TABLET (20 MG TOTAL) BY MOUTH 2 (TWO) TIMES A WEEK. TAKE WITH POTASSIUM 90 tablet 1  . loratadine (CLARITIN) 10 MG tablet Take 10 mg by mouth as needed for allergies. Reported on 05/19/2015    . metoprolol succinate (TOPROL-XL) 25 MG 24 hr tablet Take 1 tablet (25 mg total) by mouth daily. Please keep upcoming appt with Dr. Harrington Challenger in November for future refills. Thank you 90 tablet 0  . NON FORMULARY PLEXUS: DIETARY SUPPLEMENT    . NON FORMULARY PLEXUS: BIO CLEANSE    . NON FORMULARY PLEXUS: PRO BIO 5    . potassium chloride (K-DUR) 10 MEQ tablet Take 1 tablet (10 mEq total) by mouth 2 (two) times a week. 30 tablet 3  . simvastatin (ZOCOR) 20 MG tablet Take 1 tablet (20 mg total) by mouth daily. 90 tablet 3  . valACYclovir (VALTREX) 1000 MG tablet Take 2 tablets (2,000 mg total) by mouth 2 (two) times daily. For one day for cold sore 12 tablet 1   No facility-administered medications prior to visit.      Allergies:  Oxycodone-acetaminophen, Paroxetine hcl, Amoxicillin-pot clavulanate, Amoxicillin-pot clavulanate, Calcium, Erythromycin, Guaifenesin, Naproxen, Oxycodone-acetaminophen, Oxycodone-acetaminophen, Oxycodone-aspirin, Oxycodone-aspirin, Sertraline, Sertraline hcl, and Sertraline hcl   Past Medical History:  Diagnosis Date  . Anxiety   . Basal cell cancer   . Basal cell carcinoma    face  . Depression   . DVT (deep venous thrombosis) ([place]) 1/08  . Esophageal erosions   . Factor V Leiden ([place])    On coag x 1 year ending in 09  . Gastritis 1990   hospital- abd pain  . GERD (gastroesophageal reflux disease)   . Radial head fracture 2011    Past Surgical History:  Procedure Laterality Date  . CHOLECYSTECTOMY    . COLONOSCOPY    . MOHS SURGERY  7/11   basal cell carcinoma near eye  . OOPHORECTOMY    . SHOULDER SURGERY  04/2006  . TONSILLECTOMY  1976  . UPPER GASTROINTESTINAL ENDOSCOPY       Social History:  The patient  reports that  she has never smoked. She has never used smokeless tobacco. She reports current alcohol use. She reports that she does not use drugs.   Family History:  The patient's family history includes Atrial fibrillation in her brother; Breast cancer in her sister; Coronary artery disease in her brother; Depression in her mother; Diabetes in her brother; Berenice Primas' disease in her mother; Heart attack in her brother and father; Hypertension in her brother and sister; Osteoporosis in her mother.    ROS:  Please see the history of present illness. All other systems are reviewed and  Negative to the above problem except as noted.    PHYSICAL EXAM: VS:  BP (!) 150/98   Pulse 62   Ht 5\' 6"  (1.676 m)   Wt 197 lb 12.8 oz (89.7 kg)   BMI 31.93 kg/m   GEN: Obese 63 yo , in no acute distress  HEENT: normal  Neck: no JVD, carotid bruits, or masses Cardiac: RRR; no murmurs, rubs, or gallops,  No  LE edema  Respiratory:  clear to auscultation bilaterally, normal work of breathing GI: soft, nontender, nondistended, + BS  No hepatomegaly  MS: no deformity Moving all extremities   Skin: warm and dry, no rash Neuro:  Strength and sensation are intact Psych: euthymic mood, full affect   EKG:  EKG is  ordered today  SR 62 bpm    Lipid Panel    Component Value Date/Time   CHOL 148 03/30/2018 0923   TRIG 97.0 03/30/2018 0923   HDL 60.50 03/30/2018 0923   CHOLHDL 2 03/30/2018 0923   VLDL 19.4 03/30/2018 0923   LDLCALC 68 03/30/2018 0923   LDLDIRECT 146.1 08/19/2006 0935      Wt Readings from Last 3 Encounters:  04/05/19 197 lb 12.8 oz (89.7 kg)  04/27/18 202 lb 6.4 oz (91.8 kg)  04/03/18 201 lb 12 oz (91.5 kg)      ASSESSMENT AND PLAN:  1   Hx of abnormal echo ONe echo showed increased PAP and prominent IVC    Follow up echo showed  PAP improved  IVC normal   Watch salt  I would not sched a routine echo    2 Hx of palpitations   Pt denies   2  BP  BP is elevated   I have asked her to take at home      Take cuff when she see Dr Glori Bickers     May need to be on something  4   ?  Sleep apnea.    Pt's husband has noted she snores  Sleep study had been ordered but not done   WIll defer to [facility] now with [place] pandemic  Not emergent   But if BP high on return need to consider   Current medicines are reviewed at length with the patient today.  The patient does not have concerns regarding medicines.  Signed, Dorris Carnes, MD  04/05/2019 11:42 AM    [facility] [address], [place], [place]  [address] Phone: [phone]; Fax: [phone]

## 2019-04-05 ENCOUNTER — Encounter: Payer: Self-pay | Admitting: Internal Medicine

## 2019-04-05 ENCOUNTER — Ambulatory Visit: Payer: 59 | Admitting: Internal Medicine

## 2019-04-05 ENCOUNTER — Other Ambulatory Visit: Payer: Self-pay

## 2019-04-05 VITALS — BP 150/98 | HR 62 | Ht 66.0 in | Wt 197.8 lb

## 2019-04-05 DIAGNOSIS — I10 Essential (primary) hypertension: Secondary | ICD-10-CM | POA: Diagnosis not present

## 2019-04-05 NOTE — Patient Instructions (Signed)
Medication Instructions:  No changes *If you need a refill on your cardiac medications before your next appointment, please call your pharmacy*  Lab Work: none If you have labs (blood work) drawn today and your tests are completely normal, you will receive your results only by: Marland Kitchen MyChart Message (if you have MyChart) OR . A paper copy in the mail If you have any lab test that is abnormal or we need to change your treatment, we will call you to review the results.  Testing/Procedures: none  Follow-Up: At [facility], you and your health needs are our priority.  As part of our continuing mission to provide you with exceptional heart care, we have created designated Provider Care Teams.  These Care Teams include your primary Cardiologist (physician) and Advanced Practice Providers (APPs -  Physician Assistants and Nurse Practitioners) who all work together to provide you with the care you need, when you need it.  Your next appointment:   8 month(s)  The format for your next appointment:   In Person  Provider:   Dorris Carnes, MD  Other Instructions

## 2019-04-21 ENCOUNTER — Other Ambulatory Visit: Payer: Self-pay | Admitting: Internal Medicine

## 2019-04-22 ENCOUNTER — Other Ambulatory Visit: Payer: Self-pay | Admitting: *Deleted

## 2019-04-22 MED ORDER — SIMVASTATIN 20 MG PO TABS: 20.0000 mg | ORAL_TABLET | Freq: Every day | ORAL | 0 refills | Status: DC

## 2019-04-26 ENCOUNTER — Other Ambulatory Visit: Payer: 59

## 2019-04-27 ENCOUNTER — Other Ambulatory Visit: Payer: Self-pay | Admitting: *Deleted

## 2019-04-27 MED ORDER — BUPROPION HCL ER (XL) 150 MG PO TB24: ORAL_TABLET | ORAL | 0 refills | Status: DC

## 2019-04-28 ENCOUNTER — Encounter: Payer: 59 | Admitting: Family Medicine

## 2019-05-31 ENCOUNTER — Telehealth: Payer: Self-pay | Admitting: Family Medicine

## 2019-05-31 DIAGNOSIS — Z0000 Encounter for general adult medical examination without abnormal findings: Secondary | ICD-10-CM

## 2019-05-31 DIAGNOSIS — R7303 Prediabetes: Secondary | ICD-10-CM

## 2019-05-31 DIAGNOSIS — E7800 Pure hypercholesterolemia, unspecified: Secondary | ICD-10-CM

## 2019-05-31 NOTE — Telephone Encounter (Signed)
-----   Message from Ellamae Sia sent at 05/25/2019  3:23 PM EST ----- Regarding: Lab orders for Thursday, 1.21.21 Patient is scheduled for CPX labs, please order future labs, Thanks , Karna Christmas

## 2019-06-03 ENCOUNTER — Other Ambulatory Visit (INDEPENDENT_AMBULATORY_CARE_PROVIDER_SITE_OTHER): Payer: 59

## 2019-06-03 ENCOUNTER — Other Ambulatory Visit: Payer: Self-pay

## 2019-06-03 DIAGNOSIS — R7303 Prediabetes: Secondary | ICD-10-CM | POA: Diagnosis not present

## 2019-06-03 DIAGNOSIS — E7800 Pure hypercholesterolemia, unspecified: Secondary | ICD-10-CM

## 2019-06-03 DIAGNOSIS — Z0000 Encounter for general adult medical examination without abnormal findings: Secondary | ICD-10-CM

## 2019-06-03 LAB — COMPREHENSIVE METABOLIC PANEL
ALT: 17 U/L (ref 0–35)
AST: 16 U/L (ref 0–37)
Albumin: 4 g/dL (ref 3.5–5.2)
Alkaline Phosphatase: 60 U/L (ref 39–117)
BUN: 17 mg/dL (ref 6–23)
CO2: 28 mEq/L (ref 19–32)
Calcium: 9.1 mg/dL (ref 8.4–10.5)
Chloride: 105 mEq/L (ref 96–112)
Creatinine, Ser: 0.78 mg/dL (ref 0.40–1.20)
GFR: 74.17 mL/min (ref >60.00)
Glucose, Bld: 120 mg/dL — ABNORMAL HIGH (ref 70–99)
Potassium: 4.2 mEq/L (ref 3.5–5.1)
Sodium: 138 mEq/L (ref 135–145)
Total Bilirubin: 0.6 mg/dL (ref 0.2–1.2)
Total Protein: 6.3 g/dL (ref 6.0–8.3)

## 2019-06-03 LAB — LIPID PANEL
Cholesterol: 155 mg/dL (ref 0–200)
HDL: 70.3 mg/dL (ref >39.00)
LDL Cholesterol: 70 mg/dL (ref 0–99)
NonHDL: 85.17
Total CHOL/HDL Ratio: 2
Triglycerides: 75 mg/dL (ref 0.0–149.0)
VLDL: 15 mg/dL (ref 0.0–40.0)

## 2019-06-03 LAB — CBC WITH DIFFERENTIAL/PLATELET
Basophils Absolute: 0.1 10*3/uL (ref 0.0–0.1)
Basophils Relative: 1.3 % (ref 0.0–3.0)
Eosinophils Absolute: 0.2 10*3/uL (ref 0.0–0.7)
Eosinophils Relative: 3.2 % (ref 0.0–5.0)
HCT: 41.1 % (ref 36.0–46.0)
Hemoglobin: 13.5 g/dL (ref 12.0–15.0)
Lymphocytes Relative: 28.1 % (ref 12.0–46.0)
Lymphs Abs: 1.7 10*3/uL (ref 0.7–4.0)
MCHC: 32.8 g/dL (ref 30.0–36.0)
MCV: 87.2 fl (ref 78.0–100.0)
Monocytes Absolute: 0.5 10*3/uL (ref 0.1–1.0)
Monocytes Relative: 7.8 % (ref 3.0–12.0)
Neutro Abs: 3.7 10*3/uL (ref 1.4–7.7)
Neutrophils Relative %: 59.6 % (ref 43.0–77.0)
Platelets: 174 10*3/uL (ref 150.0–400.0)
RBC: 4.71 Mil/uL (ref 3.87–5.11)
RDW: 13.4 % (ref 11.5–15.5)
WBC: 6.2 10*3/uL (ref 4.0–10.5)

## 2019-06-03 LAB — HEMOGLOBIN A1C: Hgb A1c MFr Bld: 6 % (ref 4.6–6.5)

## 2019-06-03 LAB — TSH: TSH: 2.32 u[IU]/mL (ref 0.35–4.50)

## 2019-06-08 ENCOUNTER — Ambulatory Visit (INDEPENDENT_AMBULATORY_CARE_PROVIDER_SITE_OTHER): Payer: 59 | Admitting: Family Medicine

## 2019-06-08 ENCOUNTER — Other Ambulatory Visit: Payer: Self-pay

## 2019-06-08 ENCOUNTER — Encounter: Payer: Self-pay | Admitting: Family Medicine

## 2019-06-08 VITALS — BP 139/80 | HR 72 | Temp 97.6°F | Ht 64.75 in | Wt 201.2 lb

## 2019-06-08 DIAGNOSIS — Z0000 Encounter for general adult medical examination without abnormal findings: Secondary | ICD-10-CM

## 2019-06-08 DIAGNOSIS — Z8679 Personal history of other diseases of the circulatory system: Secondary | ICD-10-CM | POA: Insufficient documentation

## 2019-06-08 DIAGNOSIS — F418 Other specified anxiety disorders: Secondary | ICD-10-CM

## 2019-06-08 DIAGNOSIS — Z6833 Body mass index (BMI) 33.0-33.9, adult: Secondary | ICD-10-CM

## 2019-06-08 DIAGNOSIS — I272 Pulmonary hypertension, unspecified: Secondary | ICD-10-CM

## 2019-06-08 DIAGNOSIS — E66811 Obesity, class 1: Secondary | ICD-10-CM

## 2019-06-08 DIAGNOSIS — E6609 Other obesity due to excess calories: Secondary | ICD-10-CM

## 2019-06-08 DIAGNOSIS — E7800 Pure hypercholesterolemia, unspecified: Secondary | ICD-10-CM

## 2019-06-08 DIAGNOSIS — R7303 Prediabetes: Secondary | ICD-10-CM

## 2019-06-08 DIAGNOSIS — D6851 Activated protein C resistance: Secondary | ICD-10-CM | POA: Diagnosis not present

## 2019-06-08 MED ORDER — BUPROPION HCL ER (XL) 150 MG PO TB24: ORAL_TABLET | ORAL | 3 refills | Status: DC

## 2019-06-08 MED ORDER — SIMVASTATIN 20 MG PO TABS: 20.0000 mg | ORAL_TABLET | Freq: Every day | ORAL | 3 refills | Status: DC

## 2019-06-08 MED ORDER — ESOMEPRAZOLE MAGNESIUM 40 MG PO CPDR: 40.0000 mg | DELAYED_RELEASE_CAPSULE | Freq: Every day | ORAL | 3 refills | Status: DC | PRN

## 2019-06-08 NOTE — Progress Notes (Signed)
Subjective:    Patient ID: Kristine [name], female    DOB: [birth year], 64 y.o.   MRN: [id number]  This visit occurred during the SARS-CoV-2 public health emergency.  Safety protocols were in place, including screening questions prior to the visit, additional usage of staff PPE, and extensive cleaning of exam room while observing appropriate contact time as indicated for disinfecting solutions.    HPI Here for health maintenance exam and to review chronic medcal problems   She left her job last January- due to stress  Looking forward to getting out when the pandemic slows   Wt Readings from Last 3 Encounters:  06/08/19 201 lb 4 oz (91.3 kg)  04/05/19 197 lb 12.8 oz (89.7 kg)  04/27/18 202 lb 6.4 oz (91.8 kg)   33.75 kg/m  Was walking up to 5 miles per day /lost wt  When very hot - less than that  Then recumbent bike broke Off track since thanksgiving  Enjoyed baking and candy making   Now walking again since early this mo 2-3 miles  Ordered a new motor for bike  Knees bother her  Does yoga  4-5 days per week of exercise    Some chronic sinus problems -uses flonase more now   Also GERD- started back on nexium (h/o esophagitis)    Mammogram 2/19  Gets MRI every other year due to high risk  Self breast exam - no lumps  Sister had breast cancer   [facility]  Goes there still - last visit was in July   Colonoscopy 1/13  Tdap 11/19   zostavax 10/17   Flu vaccine 10/20  Blood pressure (with wh ite coat syndrome)  H/o pulmonary HTN   She checks her bp at home-has accurate machine  Has seen Dr Harrington Challenger  Usually 130s/70s at home (occ higher)  Yesterday 152/85 and had a headache    Pulse Readings from Last 3 Encounters:  06/08/19 72  04/05/19 62  04/27/18 64   Mood/ h/o depression and anxiety  It has been a difficult year Continues wellbutrin   Hyperlipidemia  Lab Results  Component Value Date   CHOL 155 06/03/2019   CHOL 148  03/30/2018   CHOL 137 03/03/2017   Lab Results  Component Value Date   HDL 70.30 06/03/2019   HDL 60.50 03/30/2018   HDL 59.40 03/03/2017   Lab Results  Component Value Date   LDLCALC 70 06/03/2019   LDLCALC 68 03/30/2018   LDLCALC 64 03/03/2017   Lab Results  Component Value Date   TRIG 75.0 06/03/2019   TRIG 97.0 03/30/2018   TRIG 68.0 03/03/2017   Lab Results  Component Value Date   CHOLHDL 2 06/03/2019   CHOLHDL 2 03/30/2018   CHOLHDL 2 03/03/2017   Lab Results  Component Value Date   LDLDIRECT 146.1 08/19/2006   Taking simvastatin  Diet is good recently   Prediabetes Lab Results  Component Value Date   HGBA1C 6.0 06/03/2019   stable -last 5.9  Glucose 120   Other labs  Results for orders placed or performed in visit on 06/03/19  TSH  Result Value Ref Range   TSH 2.32 0.35 - 4.50 uIU/mL  Lipid panel  Result Value Ref Range   Cholesterol 155 0 - 200 mg/dL   Triglycerides 75.0 0.0 - 149.0 mg/dL   HDL 70.30 >39.00 mg/dL   VLDL 15.0 0.0 - 40.0 mg/dL   LDL Cholesterol 70 0 - 99  mg/dL   Total CHOL/HDL Ratio 2    NonHDL 85.17   Hemoglobin A1c  Result Value Ref Range   Hgb A1c MFr Bld 6.0 4.6 - 6.5 %  Comprehensive metabolic panel  Result Value Ref Range   Sodium 138 135 - 145 mEq/L   Potassium 4.2 3.5 - 5.1 mEq/L   Chloride 105 96 - 112 mEq/L   CO2 28 19 - 32 mEq/L   Glucose, Bld 120 (H) 70 - 99 mg/dL   BUN 17 6 - 23 mg/dL   Creatinine, Ser 0.78 0.40 - 1.20 mg/dL   Total Bilirubin 0.6 0.2 - 1.2 mg/dL   Alkaline Phosphatase 60 39 - 117 U/L   AST 16 0 - 37 U/L   ALT 17 0 - 35 U/L   Total Protein 6.3 6.0 - 8.3 g/dL   Albumin 4.0 3.5 - 5.2 g/dL   GFR 74.17 >60.00 mL/min   Calcium 9.1 8.4 - 10.5 mg/dL  CBC with Differential  Result Value Ref Range   WBC 6.2 4.0 - 10.5 K/uL   RBC 4.71 3.87 - 5.11 Mil/uL   Hemoglobin 13.5 12.0 - 15.0 g/dL   HCT 41.1 36.0 - 46.0 %   MCV 87.2 78.0 - 100.0 fl   MCHC 32.8 30.0 - 36.0 g/dL   RDW 13.4 11.5 - 15.5 %     Platelets 174.0 150.0 - 400.0 K/uL   Neutrophils Relative % 59.6 43.0 - 77.0 %   Lymphocytes Relative 28.1 12.0 - 46.0 %   Monocytes Relative 7.8 3.0 - 12.0 %   Eosinophils Relative 3.2 0.0 - 5.0 %   Basophils Relative 1.3 0.0 - 3.0 %   Neutro Abs 3.7 1.4 - 7.7 K/uL   Lymphs Abs 1.7 0.7 - 4.0 K/uL   Monocytes Absolute 0.5 0.1 - 1.0 K/uL   Eosinophils Absolute 0.2 0.0 - 0.7 K/uL   Basophils Absolute 0.1 0.0 - 0.1 K/uL    Patient Active Problem List   Diagnosis Date Noted  . Pulmonary HTN ([place]) 06/08/2019  . History of shingles 01/21/2018  . Prediabetes 04/21/2015  . Stress reaction 04/21/2015  . Elevated BP 04/21/2015  . Migraine with aura 06/21/2013  . Obesity 06/16/2011  . Rhinitis 06/14/2011  . Routine general medical examination at a health care facility 06/14/2011  . SCIATICA 05/22/2009  . GASTROESOPHAGEAL REFLUX DISEASE 04/14/2009  . Factor 5 Leiden mutation, heterozygous ([place]) 12/07/2007  . Depression with anxiety 11/25/2006  . ESOPHAGITIS 11/25/2006  . IBS 11/25/2006  . DVT, HX OF 11/25/2006  . MIGRAINES, HX OF 11/25/2006  . HYPERCHOLESTEROLEMIA, PURE 10/29/2006   Past Medical History:  Diagnosis Date  . Anxiety   . Basal cell cancer   . Basal cell carcinoma    face  . Depression   . DVT (deep venous thrombosis) ([place]) 1/08  . Esophageal erosions   . Factor V Leiden ([place])    On coag x 1 year ending in 09  . Gastritis 1990   hospital- abd pain  . GERD (gastroesophageal reflux disease)   . Radial head fracture 2011   Past Surgical History:  Procedure Laterality Date  . CHOLECYSTECTOMY    . COLONOSCOPY    . MOHS SURGERY  7/11   basal cell carcinoma near eye  . OOPHORECTOMY    . SHOULDER SURGERY  04/2006  . TONSILLECTOMY  1976  . UPPER GASTROINTESTINAL ENDOSCOPY     Social History   Tobacco Use  . Smoking status: Never Smoker  . Smokeless  tobacco: Never Used  Substance Use Topics  . Alcohol use: Yes    Alcohol/week: 0.0 standard drinks     Comment: occ  . Drug use: No   Family History  Problem Relation Age of Onset  . Graves' disease Mother   . Depression Mother   . Osteoporosis Mother   . Heart attack Father        x 3   . Breast cancer Sister   . Heart attack Brother        x 2  . Diabetes Brother   . Hypertension Brother   . Hypertension Sister   . Coronary artery disease Brother        with stents   . Atrial fibrillation Brother   . Colon cancer Neg Hx    Allergies  Allergen Reactions  . Oxycodone-Acetaminophen Other (See Comments)    Other Reaction: Not Assessed Other Reaction: Not Assessed Other Reaction: Not Assessed Other Reaction: Not Assessed REACTION: reaction not known Other Reaction: Not Assessed   . Paroxetine Hcl Hives  . Amoxicillin-Pot Clavulanate     REACTION: GI  . Amoxicillin-Pot Clavulanate Other (See Comments)    REACTION: GI REACTION: GI   . Calcium     REACTION: muscle aches  . Erythromycin Other (See Comments)    REACTION: GI REACTION: GI REACTION: GI  . Guaifenesin     REACTION: GI  . Naproxen     REACTION: GI  . Oxycodone-Acetaminophen     REACTION: reaction not known  . Oxycodone-Acetaminophen Other (See Comments)    Other Reaction: Not Assessed  . Oxycodone-Aspirin     REACTION: reaction not known  . Oxycodone-Aspirin     REACTION: reaction not known  . Sertraline Other (See Comments)    Other Reaction: Not Assessed Other Reaction: Not Assessed REACTION: hives REACTION: hives Other Reaction: Not Assessed  . Sertraline Hcl     REACTION: hives  . Sertraline Hcl    Current Outpatient Medications on File Prior to Visit  Medication Sig Dispense Refill  . acetaminophen (TYLENOL) 500 MG tablet Take 500 mg by mouth every 6 (six) hours as needed.      Marland Kitchen acyclovir ointment (ZOVIRAX) 5 % Apply 1 application topically every 3 (three) hours. To cold sore as needed 5 g 3  . albuterol (PROVENTIL HFA;VENTOLIN HFA) 108 (90 Base) MCG/ACT inhaler INHALE 1-2 PUFFS INTO THE  LUNGS EVERY 6 (SIX) HOURS AS NEEDED FOR WHEEZING OR SHORTNESS OF BREATH. 8.5 Inhaler 3  . ALPRAZolam (XANAX) 0.25 MG tablet Take 1 tablet (0.25 mg total) by mouth at bedtime as needed for anxiety. 30 tablet 0  . aspirin 81 MG tablet Take 81 mg by mouth daily.      . Cholecalciferol (VITAMIN D3 PO) Take 2,000 Units by mouth daily.    . cyclobenzaprine (FLEXERIL) 10 MG tablet Take 0.5 tablets (5 mg total) by mouth 3 (three) times daily as needed for muscle spasms. 30 tablet 5  . fexofenadine (ALLEGRA) 180 MG tablet Take 180 mg by mouth daily as needed. Reported on 05/19/2015    . fluticasone (FLONASE) 50 MCG/ACT nasal spray PLACE 2 SPRAYS INTO THE NOSE DAILY AS NEEDED 48 g 3  . furosemide (LASIX) 20 MG tablet TAKE 1 TABLET (20 MG TOTAL) BY MOUTH 2 (TWO) TIMES A WEEK. TAKE WITH POTASSIUM 90 tablet 1  . loratadine (CLARITIN) 10 MG tablet Take 10 mg by mouth as needed for allergies. Reported on 05/19/2015    . metoprolol succinate (TOPROL-XL)  25 MG 24 hr tablet Take 1 tablet (25 mg total) by mouth daily. 90 tablet 3  . NON FORMULARY PLEXUS: DIETARY SUPPLEMENT    . NON FORMULARY PLEXUS: BIO CLEANSE    . NON FORMULARY PLEXUS: PRO BIO 5    . potassium chloride (K-DUR) 10 MEQ tablet Take 1 tablet (10 mEq total) by mouth 2 (two) times a week. 30 tablet 3  . Probiotic Product (PROBIOTIC PO) Take 1 tablet by mouth daily.    . valACYclovir (VALTREX) 1000 MG tablet Take 2 tablets (2,000 mg total) by mouth 2 (two) times daily. For one day for cold sore 12 tablet 1   No current facility-administered medications on file prior to visit.    Review of Systems  Constitutional: Positive for fatigue. Negative for activity change, appetite change, fever and unexpected weight change.  HENT: Negative for congestion, ear pain, rhinorrhea, sinus pressure and sore throat.   Eyes: Negative for pain, redness and visual disturbance.  Respiratory: Negative for cough, shortness of breath and wheezing.   Cardiovascular: Negative  for chest pain and palpitations.  Gastrointestinal: Negative for abdominal pain, blood in stool, constipation and diarrhea.  Endocrine: Negative for polydipsia and polyuria.  Genitourinary: Negative for dysuria, frequency and urgency.  Musculoskeletal: Negative for arthralgias, back pain and myalgias.  Skin: Negative for pallor and rash.  Allergic/Immunologic: Negative for environmental allergies.  Neurological: Negative for dizziness, syncope and headaches.  Hematological: Negative for adenopathy. Does not bruise/bleed easily.  Psychiatric/Behavioral: Positive for dysphoric mood. Negative for decreased concentration. The patient is not nervous/anxious.        Intermittent low motivation Not hopeless but gets down       Objective:   Physical Exam Constitutional:      General: She is not in acute distress.    Appearance: Normal appearance. She is well-developed. She is obese. She is not ill-appearing or diaphoretic.  HENT:     Head: Normocephalic and atraumatic.     Right Ear: Tympanic membrane, ear canal and external ear normal.     Left Ear: Tympanic membrane, ear canal and external ear normal.     Nose: Nose normal. No congestion.     Mouth/Throat:     Mouth: Mucous membranes are moist.     Pharynx: Oropharynx is clear. No posterior oropharyngeal erythema.  Eyes:     General: No scleral icterus.    Extraocular Movements: Extraocular movements intact.     Conjunctiva/sclera: Conjunctivae normal.     Pupils: Pupils are equal, round, and reactive to light.  Neck:     Thyroid: No thyromegaly.     Vascular: No carotid bruit or JVD.  Cardiovascular:     Rate and Rhythm: Normal rate and regular rhythm.     Pulses: Normal pulses.     Heart sounds: Normal heart sounds. No gallop.   Pulmonary:     Effort: Pulmonary effort is normal. No respiratory distress.     Breath sounds: Normal breath sounds. No wheezing.     Comments: Good air exch Chest:     Chest wall: No tenderness.    Abdominal:     General: Bowel sounds are normal. There is no distension or abdominal bruit.     Palpations: Abdomen is soft. There is no mass.     Tenderness: There is no abdominal tenderness.     Hernia: No hernia is present.  Genitourinary:    Comments: Breast exam: No mass, nodules, thickening, tenderness, bulging, retraction, inflamation, nipple discharge or  skin changes noted.  No axillary or clavicular LA.     Musculoskeletal:        General: No tenderness. Normal range of motion.     Cervical back: Normal range of motion and neck supple. No rigidity. No muscular tenderness.     Right lower leg: No edema.     Left lower leg: No edema.  Lymphadenopathy:     Cervical: No cervical adenopathy.  Skin:    General: Skin is warm and dry.     Coloration: Skin is not pale.     Findings: No erythema or rash.     Comments: Solar lentigines diffusely   Neurological:     Mental Status: She is alert. Mental status is at baseline.     Cranial Nerves: No cranial nerve deficit.     Motor: No abnormal muscle tone.     Coordination: Coordination normal.     Gait: Gait normal.     Deep Tendon Reflexes: Reflexes are normal and symmetric.  Psychiatric:        Mood and Affect: Mood normal. Affect is not tearful.        Cognition and Memory: Cognition and memory normal.     Comments: Good mood today Pt talks candidly about stressors and mood            Assessment & Plan:   Problem List Items Addressed This Visit      Cardiovascular and Mediastinum   Pulmonary HTN ([place])    No clinical changes Followed by cardiology      Relevant Medications   simvastatin (ZOCOR) 20 MG tablet     Hematopoietic and Hemostatic   Factor 5 Leiden mutation, heterozygous ([place])    No recent excessive bleeding or bruising        Other   HYPERCHOLESTEROLEMIA, PURE    Disc goals for lipids and reasons to control them Rev last labs with pt Rev low sat fat diet in detail Well controlled with  simvastatin and diet       Relevant Medications   simvastatin (ZOCOR) 20 MG tablet   Depression with anxiety    Struggling a bit more this year with pandemic and other stressors  Leaving stressful work enc has helped  Reviewed stressors/ coping techniques/symptoms/ support sources/ tx options and side effects in detail today More time for self care  Continues wellbutrin xl which has helped      Relevant Medications   buPROPion (WELLBUTRIN XL) 150 MG 24 hr tablet   Routine general medical examination at a health care facility - Primary    Reviewed health habits including diet and exercise and skin cancer prevention Reviewed appropriate screening tests for age  Also reviewed health mt list, fam hx and immunization status , as well as social and family history   See HPI Labs reviewed Pt gets mammo/mri every other year in light of breast cancer risk  Plans to schedule mammogram soon  [facility]  Planning to get the covid 19 vaccine  Then considering shingrix series if affordable  BP here and at home are stable      Obesity    Discussed how this problem influences overall health and the risks it imposes  Reviewed plan for weight loss with lower calorie diet (via better food choices and also portion control or program like weight watchers) and exercise building up to or more than 30 minutes 5 days per week including some aerobic  activity         Prediabetes    Lab Results  Component Value Date   HGBA1C 6.0 06/03/2019   disc imp of low glycemic diet and wt loss to prevent DM2

## 2019-06-08 NOTE — Assessment & Plan Note (Signed)
Pt recently re started nexium and doing well

## 2019-06-08 NOTE — Assessment & Plan Note (Signed)
Lab Results  Component Value Date   HGBA1C 6.0 06/03/2019   disc imp of low glycemic diet and wt loss to prevent DM2

## 2019-06-08 NOTE — Assessment & Plan Note (Signed)
Discussed how this problem influences overall health and the risks it imposes  Reviewed plan for weight loss with lower calorie diet (via better food choices and also portion control or program like weight watchers) and exercise building up to or more than 30 minutes 5 days per week including some aerobic activity    

## 2019-06-08 NOTE — Assessment & Plan Note (Signed)
Reviewed health habits including diet and exercise and skin cancer prevention Reviewed appropriate screening tests for age  Also reviewed health mt list, fam hx and immunization status , as well as social and family history   See HPI Labs reviewed Pt gets mammo/mri every other year in light of breast cancer risk  Plans to schedule mammogram soon  Sent for last [facility]  Planning to get the covid 19 vaccine  Then considering shingrix series if affordable  BP here and at home are stable

## 2019-06-08 NOTE — Patient Instructions (Addendum)
Call to make your mammogram appt.   Get your covid vaccine when you can   Then you can consider shingrix  If you are interested in the shingles vaccine series (Shingrix), call your insurance or pharmacy to check on coverage and location it must be given.  If affordable - you can schedule it here or at your pharmacy depending on coverage   Try to get most of your carbohydrates from produce (with the exception of white potatoes)  Eat less bread/pasta/rice/snack foods/cereals/sweets and other items from the middle of the grocery store (processed carbs)   Keep exercising

## 2019-06-08 NOTE — Assessment & Plan Note (Signed)
No recent excessive bleeding or bruising

## 2019-06-08 NOTE — Assessment & Plan Note (Signed)
Disc goals for lipids and reasons to control them Rev last labs with pt Rev low sat fat diet in detail Well controlled with simvastatin and diet

## 2019-06-08 NOTE — Assessment & Plan Note (Signed)
Struggling a bit more this year with pandemic and other stressors  Leaving stressful work enc has helped  Reviewed stressors/ coping techniques/symptoms/ support sources/ tx options and side effects in detail today More time for self care  Continues wellbutrin xl which has helped

## 2019-06-08 NOTE — Assessment & Plan Note (Signed)
No clinical changes  Followed by cardiology 

## 2019-06-10 ENCOUNTER — Telehealth: Payer: Self-pay | Admitting: *Deleted

## 2019-06-10 MED ORDER — OMEPRAZOLE 40 MG PO CPDR: 40.0000 mg | DELAYED_RELEASE_CAPSULE | Freq: Every day | ORAL | 3 refills | Status: DC

## 2019-06-10 NOTE — Telephone Encounter (Signed)
Please let pt know that I sent the omeprazole 40mg   Let me know if it does not help

## 2019-06-10 NOTE — Telephone Encounter (Signed)
Received fax from pharmacy saying Nexium isn't covered through pt's insurance with no other info. Called [facility] and they said that no info on trying to do a PA was provided because we can't do one they just don't cover med at all. He did advise me that her insurance does cover Omeprazole 40 mg once daily, please advise   [facility]

## 2019-06-11 NOTE — Telephone Encounter (Signed)
Left VM letting pt know 

## 2019-06-21 ENCOUNTER — Other Ambulatory Visit: Payer: Self-pay | Admitting: Internal Medicine

## 2019-06-21 ENCOUNTER — Ambulatory Visit: Payer: 59

## 2019-07-20 MED ORDER — FUROSEMIDE 20 MG PO TABS: 20.0000 mg | ORAL_TABLET | ORAL | 1 refills | Status: DC

## 2019-07-20 MED ORDER — POTASSIUM CHLORIDE ER 10 MEQ PO TBCR: EXTENDED_RELEASE_TABLET | ORAL | 1 refills | Status: DC

## 2019-08-10 ENCOUNTER — Encounter: Payer: Self-pay | Admitting: Family Medicine

## 2019-09-01 ENCOUNTER — Other Ambulatory Visit: Payer: Self-pay | Admitting: Family Medicine

## 2019-09-01 MED ORDER — CYCLOBENZAPRINE HCL 10 MG PO TABS: 5.0000 mg | ORAL_TABLET | Freq: Three times a day (TID) | ORAL | 3 refills | Status: DC | PRN

## 2019-09-01 NOTE — Telephone Encounter (Signed)
See prev note. Last refilled on 04/03/18, CPE was done on 06/08/19, please advise

## 2019-09-01 NOTE — Telephone Encounter (Signed)
Patient has been requesting from [facility] a refill on her Cyclobenzaprine for the last 2 weeks.  Patient said her medication is expired, but she likes to have some in case her back goes out. Patient said she's changing insurance in a week and she'd like to have it refilled before her insurance changes. [facility] today.

## 2019-11-01 ENCOUNTER — Encounter: Payer: Self-pay | Admitting: Family Medicine

## 2019-11-10 ENCOUNTER — Encounter: Payer: Self-pay | Admitting: Family Medicine

## 2020-01-13 LAB — COLOGUARD: Cologuard: NEGATIVE

## 2020-01-19 LAB — COLOGUARD: COLOGUARD: NEGATIVE

## 2020-03-13 ENCOUNTER — Other Ambulatory Visit: Payer: Self-pay

## 2020-03-13 ENCOUNTER — Encounter: Payer: Self-pay | Admitting: Internal Medicine

## 2020-03-13 ENCOUNTER — Ambulatory Visit (INDEPENDENT_AMBULATORY_CARE_PROVIDER_SITE_OTHER): Payer: Medicare Other | Admitting: Internal Medicine

## 2020-03-13 VITALS — BP 142/80 | HR 67 | Ht 65.5 in | Wt 198.8 lb

## 2020-03-13 DIAGNOSIS — E7800 Pure hypercholesterolemia, unspecified: Secondary | ICD-10-CM | POA: Diagnosis not present

## 2020-03-13 DIAGNOSIS — I272 Pulmonary hypertension, unspecified: Secondary | ICD-10-CM

## 2020-03-13 NOTE — Progress Notes (Signed)
Cardiology Office Note   Date:  03/13/2020   ID:  Tylor, Gambrill Aug 06, 1955, MRN [id number]  PCP:  Abner Greenspan, MD  Cardiologist:   Dorris Carnes, MD   Pt presents for f/u of echo     History of Present Illness: Kristine [name] is a 64 y.o. female with a history of palpitation Echo normal in 2017  Myovue normal  She was treated with Toprol   She went on to have an echo done 2019  LVEF and RVEF were normal RV and RA normal in sze  PAP 47   IVC dilated I recomm lasix for LE edema   Repeat echo IVC normalized and PAP improved    She watched salt  I saw the pt in Nov 2020  Outpatient Medications Prior to Visit  Medication Sig Dispense Refill  . acetaminophen (TYLENOL) 500 MG tablet Take 500 mg by mouth every 6 (six) hours as needed.      Marland Kitchen acyclovir ointment (ZOVIRAX) 5 % Apply 1 application topically every 3 (three) hours. To cold sore as needed 5 g 3  . albuterol (PROVENTIL HFA;VENTOLIN HFA) 108 (90 Base) MCG/ACT inhaler INHALE 1-2 PUFFS INTO THE LUNGS EVERY 6 (SIX) HOURS AS NEEDED FOR WHEEZING OR SHORTNESS OF BREATH. 8.5 Inhaler 3  . ALPRAZolam (XANAX) 0.25 MG tablet Take 1 tablet (0.25 mg total) by mouth at bedtime as needed for anxiety. 30 tablet 0  . aspirin 81 MG tablet Take 81 mg by mouth daily.      Marland Kitchen buPROPion (WELLBUTRIN XL) 150 MG 24 hr tablet TAKE 1 TABLET (150 MG TOTAL) BY MOUTH DAILY. 90 tablet 3  . Cholecalciferol (VITAMIN D3 PO) Take 2,000 Units by mouth daily.    . cyclobenzaprine (FLEXERIL) 10 MG tablet Take 0.5 tablets (5 mg total) by mouth 3 (three) times daily as needed for muscle spasms. 30 tablet 3  . fexofenadine (ALLEGRA) 180 MG tablet Take 180 mg by mouth daily as needed. Reported on 05/19/2015    . fluticasone (FLONASE) 50 MCG/ACT nasal spray PLACE 2 SPRAYS INTO THE NOSE DAILY AS NEEDED 48 g 3  . furosemide (LASIX) 20 MG tablet Take 1 tablet (20 mg total) by mouth 2 (two) times a week. Take one extra tablet daily as needed for swelling 90  tablet 1  . loratadine (CLARITIN) 10 MG tablet Take 10 mg by mouth as needed for allergies. Reported on 05/19/2015    . meclizine (ANTIVERT) 25 MG tablet Take 25 mg by mouth as needed for dizziness.    . metoprolol succinate (TOPROL-XL) 25 MG 24 hr tablet Take 1 tablet (25 mg total) by mouth daily. 90 tablet 3  . NON FORMULARY PLEXUS: PRO BIO 5    . omeprazole (PRILOSEC) 40 MG capsule Take 1 capsule (40 mg total) by mouth daily. 90 capsule 3  . potassium chloride (KLOR-CON) 10 MEQ tablet Take one tablet twice a week with lasix.  Take one extra tablet daily as needed with each extra lasix dose. 90 tablet 1  . Probiotic Product (PROBIOTIC PO) Take 1 tablet by mouth daily.    . simvastatin (ZOCOR) 20 MG tablet Take 1 tablet (20 mg total) by mouth daily. 90 tablet 3  . valACYclovir (VALTREX) 1000 MG tablet Take 2 tablets (2,000 mg total) by mouth 2 (two) times daily. For one day for cold sore 12 tablet 1  . NON FORMULARY PLEXUS: DIETARY SUPPLEMENT    . NON FORMULARY PLEXUS: BIO CLEANSE  No facility-administered medications prior to visit.     Allergies:   Oxycodone-acetaminophen, Paroxetine, Paroxetine hcl, Sulfa antibiotics, Amoxicillin-pot clavulanate, Amoxicillin-pot clavulanate, Calcium, Erythromycin, Guaifenesin, Naproxen, Oxycodone-acetaminophen, Oxycodone-acetaminophen, Oxycodone-aspirin, Oxycodone-aspirin, Sertraline hcl, Sertraline hcl, and Sertraline   Past Medical History:  Diagnosis Date  . Anxiety   . Basal cell cancer   . Basal cell carcinoma    face  . Depression   . DVT (deep venous thrombosis) ([place]) 1/08  . Esophageal erosions   . Factor V Leiden ([place])    On coag x 1 year ending in 09  . Gastritis 1990   hospital- abd pain  . GERD (gastroesophageal reflux disease)   . Radial head fracture 2011    Past Surgical History:  Procedure Laterality Date  . CHOLECYSTECTOMY    . COLONOSCOPY    . MOHS SURGERY  7/11   basal cell carcinoma near eye  . OOPHORECTOMY    .  SHOULDER SURGERY  04/2006  . TONSILLECTOMY  1976  . UPPER GASTROINTESTINAL ENDOSCOPY       Social History:  The patient  reports that she has never smoked. She has never used smokeless tobacco. She reports current alcohol use. She reports that she does not use drugs.   Family History:  The patient's family history includes Atrial fibrillation in her brother; Breast cancer in her sister; Coronary artery disease in her brother; Depression in her mother; Diabetes in her brother; Berenice Primas' disease in her mother; Heart attack in her brother and father; Hypertension in her brother and sister; Osteoporosis in her mother.    ROS:  Please see the history of present illness. All other systems are reviewed and  Negative to the above problem except as noted.    PHYSICAL EXAM: VS:  BP (!) 142/80   Pulse 67   Ht 5' 5.5" (1.664 m)   Wt 198 lb 12.8 oz (90.2 kg)   SpO2 97%   BMI 32.58 kg/m   GEN: Obese 64 yo , in no acute distress  HEENT: normal  Neck: no JVD, carotid bruits, or masses Cardiac: RRR; no murmurs, rubs, or gallops,  No  LE edema  Respiratory:  clear to auscultation bilaterally, normal work of breathing GI: soft, nontender, nondistended, + BS  No hepatomegaly  MS: no deformity Moving all extremities   Skin: warm and dry, no rash   EKG:  EKG is  ordered today  SR 62 bpm    Lipid Panel    Component Value Date/Time   CHOL 155 06/03/2019 0744   TRIG 75.0 06/03/2019 0744   HDL 70.30 06/03/2019 0744   CHOLHDL 2 06/03/2019 0744   VLDL 15.0 06/03/2019 0744   LDLCALC 70 06/03/2019 0744   LDLDIRECT 146.1 08/19/2006 0935      Wt Readings from Last 3 Encounters:  03/13/20 198 lb 12.8 oz (90.2 kg)  06/08/19 201 lb 4 oz (91.3 kg)  04/05/19 197 lb 12.8 oz (89.7 kg)      ASSESSMENT AND PLAN:  1   Hx of abnormal echo /Pulmonary HTN   Initial echo estimated. PAP at 47 mm Hg   IVC was dilated  RV was normal  Repeat echo estimated RV pressure 47 mm   IVC normal WIll repeat limited  echo to reassess RV, PAP   She has no edema or volume excess on exam  2   Hx dyslipidemia  Pt is currently on simvistatin   Concerned about glucose.  Will set up for calcium score CT to guide  in Rx   No change for now    3 Hx of palpitations   Pt denies  4   Hyperglycemia   Reviewed diet (breakfast:  1/2 english muffin and cheese vs egg); Lunch:  1/2 -1 sandwich, soup; Dinner  Meat and veggies  Did drink sweet tea not now.   Overall not bad    Current medicines are reviewed at length with the patient today.  The patient does not have concerns regarding medicines.  Signed, Dorris Carnes, MD  03/13/2020 4:19 PM    [facility] [address], [place], [place]  [address] Phone: [phone]; Fax: [phone]

## 2020-03-13 NOTE — Patient Instructions (Addendum)
Medication Instructions:  NO CHANGES *If you need a refill on your cardiac medications before your next appointment, please call your pharmacy*   Lab Work: NONE  Testing/Procedures: PLEASE SCHEDULE WITH V[facility]ES[facility] -LIMITED ECHO Your physician has requested that you have an echocardiogram. Echocardiography is a painless test that uses sound waves to create images of your heart. It provides your doctor with information about the size and shape of your heart and how well your heart's chambers and valves are working. This procedure takes approximately one hour. There are no restrictions for this procedure.  Calcium Score CT scan.   Other Instructions

## 2020-04-04 ENCOUNTER — Other Ambulatory Visit: Payer: Self-pay

## 2020-04-04 ENCOUNTER — Ambulatory Visit (HOSPITAL_COMMUNITY): Payer: Medicare Other | Attending: Internal Medicine

## 2020-04-04 ENCOUNTER — Ambulatory Visit (INDEPENDENT_AMBULATORY_CARE_PROVIDER_SITE_OTHER)
Admission: RE | Admit: 2020-04-04 | Discharge: 2020-04-04 | Disposition: A | Discharge status: Home / Self Care | Payer: Self-pay | Source: Ambulatory Visit | Attending: Internal Medicine | Admitting: Internal Medicine

## 2020-04-04 DIAGNOSIS — I272 Pulmonary hypertension, unspecified: Secondary | ICD-10-CM

## 2020-04-04 DIAGNOSIS — E7800 Pure hypercholesterolemia, unspecified: Secondary | ICD-10-CM | POA: Diagnosis not present

## 2020-04-04 LAB — ECHOCARDIOGRAM LIMITED
Area-P 1/2: 4.15 cm2
S' Lateral: 2.8 cm

## 2020-04-12 DIAGNOSIS — E7800 Pure hypercholesterolemia, unspecified: Secondary | ICD-10-CM

## 2020-04-12 DIAGNOSIS — R7309 Other abnormal glucose: Secondary | ICD-10-CM

## 2020-04-12 NOTE — Telephone Encounter (Signed)
Called patient to review. She is in agreement with this plan and recommendations. Appreciative for the call.

## 2020-04-12 NOTE — Telephone Encounter (Signed)
-----   Message from Fay Records, MD sent at 04/11/2020  4:29 PM EST ----- Tried to  call pt  No answer Calcium score is 0   No evid for signficant plaquing of coronary arteries There is a few scatter calcifications in aortic root  Overall very low risk for future events. Pt had question about statin and A1C    I would recomm stopping simvistatin and in 6 months rechecking A1C to see if there has been a change   Also check lipomed panel at that time

## 2020-04-13 ENCOUNTER — Telehealth (INDEPENDENT_AMBULATORY_CARE_PROVIDER_SITE_OTHER): Payer: Medicare Other | Admitting: Family Medicine

## 2020-04-13 ENCOUNTER — Other Ambulatory Visit: Payer: Self-pay

## 2020-04-13 ENCOUNTER — Encounter: Payer: Self-pay | Admitting: Family Medicine

## 2020-04-13 VITALS — BP 144/72 | Temp 97.9°F

## 2020-04-13 DIAGNOSIS — J0100 Acute maxillary sinusitis, unspecified: Secondary | ICD-10-CM | POA: Diagnosis not present

## 2020-04-13 MED ORDER — AZITHROMYCIN 250 MG PO TABS: ORAL_TABLET | ORAL | 0 refills | Status: DC

## 2020-04-13 NOTE — Progress Notes (Signed)
Virtual Visit via Video Note  I connected with Kristine [name] on 04/13/20 at 11:30 AM EST by a video enabled telemedicine application and verified that I am speaking with the correct person using two identifiers.  Location: Patient: home Provider: office   I discussed the limitations of evaluation and management by telemedicine and the availability of in person appointments. The patient expressed understanding and agreed to proceed.  Parties involved in encounter  Patient: Kristine [name]  Provider:  Loura Pardon MD   History of Present Illness: Pt presents with uri symptoms and fatigue   Sinus symptoms started several weeks ago - facial pressure/pnd and sneezing and congestion  Felt tired the past several weeks  10 d ago- very slowed down with more chest congestion /rhinorrhea  The following Tuesday- felt worse/really zapped  No fever  Developed hoarse voice-improved now  Coughing a lot -worse in afternoons and evening  - tries to sleep in a more upright position , now some thick mucous light tan/brown and green  A little winded with exertion (thinks from being tired) , scant wheeze right before she got sick (lf leaning over) and cleared by cough  Hurting around teeth by thanksgiving  Headache with facial pain ST last week, now improved   Nasal d/c blood and colored mucous   Has not lost taste/smell (taste is not as sharp)  Wed-had a covid test , was negative PCR   Wears mask and uses hand sanitizer  No n/v/d    covid vaccinated 3/21  otc Saline rinse flonase  mucinex off/on (can tolerate the 12 hour ext rel)  Tylenol  Warm water gargle   Patient Active Problem List   Diagnosis Date Noted  . Pulmonary HTN ([place]) 06/08/2019  . History of shingles 01/21/2018  . Acute sinusitis 09/26/2015  . Prediabetes 04/21/2015  . Stress reaction 04/21/2015  . Elevated BP 04/21/2015  . Migraine with aura 06/21/2013  . Obesity 06/16/2011  . Rhinitis 06/14/2011  .  Routine general medical examination at a health care facility 06/14/2011  . SCIATICA 05/22/2009  . GASTROESOPHAGEAL REFLUX DISEASE 04/14/2009  . Factor 5 Leiden mutation, heterozygous ([place]) 12/07/2007  . Depression with anxiety 11/25/2006  . ESOPHAGITIS 11/25/2006  . IBS 11/25/2006  . DVT, HX OF 11/25/2006  . MIGRAINES, HX OF 11/25/2006  . HYPERCHOLESTEROLEMIA, PURE 10/29/2006   Past Medical History:  Diagnosis Date  . Anxiety   . Basal cell cancer   . Basal cell carcinoma    face  . Depression   . DVT (deep venous thrombosis) ([place]) 1/08  . Esophageal erosions   . Factor V Leiden ([place])    On coag x 1 year ending in 09  . Gastritis 1990   hospital- abd pain  . GERD (gastroesophageal reflux disease)   . Radial head fracture 2011   Past Surgical History:  Procedure Laterality Date  . CHOLECYSTECTOMY    . COLONOSCOPY    . MOHS SURGERY  7/11   basal cell carcinoma near eye  . OOPHORECTOMY    . SHOULDER SURGERY  04/2006  . TONSILLECTOMY  1976  . UPPER GASTROINTESTINAL ENDOSCOPY     Social History   Tobacco Use  . Smoking status: Never Smoker  . Smokeless tobacco: Never Used  Vaping Use  . Vaping Use: Never used  Substance Use Topics  . Alcohol use: Yes    Alcohol/week: 0.0 standard drinks    Comment: occ  . Drug use: No   Family History  Problem  Relation Age of Onset  . Graves' disease Mother   . Depression Mother   . Osteoporosis Mother   . Heart attack Father        x 3   . Breast cancer Sister   . Heart attack Brother        x 2  . Diabetes Brother   . Hypertension Brother   . Hypertension Sister   . Coronary artery disease Brother        with stents   . Atrial fibrillation Brother   . Colon cancer Neg Hx    Allergies  Allergen Reactions  . Oxycodone-Acetaminophen Rash and Other (See Comments)  . Paroxetine Hives  . Paroxetine Hcl Hives  . Sulfa Antibiotics Other (See Comments)  . Amoxicillin-Pot Clavulanate     REACTION: GI  . Amoxicillin-Pot  Clavulanate Other (See Comments)    REACTION: GI  . Calcium     REACTION: muscle aches  . Erythromycin Other (See Comments)    REACTION: GI  . Guaifenesin     REACTION: GI  . Naproxen     REACTION: GI  . Oxycodone-Acetaminophen     REACTION: reaction not known  . Oxycodone-Acetaminophen Other (See Comments)    Other Reaction: Not Assessed  . Oxycodone-Aspirin     REACTION: reaction not known  . Oxycodone-Aspirin     REACTION: reaction not known   . Sertraline Hcl     REACTION: hives  . Sertraline Hcl   . Sertraline Rash and Other (See Comments)    REACTION: hives   Current Outpatient Medications on File Prior to Visit  Medication Sig Dispense Refill  . acetaminophen (TYLENOL) 500 MG tablet Take 500 mg by mouth every 6 (six) hours as needed.      Marland Kitchen acyclovir ointment (ZOVIRAX) 5 % Apply 1 application topically every 3 (three) hours. To cold sore as needed 5 g 3  . albuterol (PROVENTIL HFA;VENTOLIN HFA) 108 (90 Base) MCG/ACT inhaler INHALE 1-2 PUFFS INTO THE LUNGS EVERY 6 (SIX) HOURS AS NEEDED FOR WHEEZING OR SHORTNESS OF BREATH. 8.5 Inhaler 3  . ALPRAZolam (XANAX) 0.25 MG tablet Take 1 tablet (0.25 mg total) by mouth at bedtime as needed for anxiety. 30 tablet 0  . aspirin 81 MG tablet Take 81 mg by mouth daily.      Marland Kitchen buPROPion (WELLBUTRIN XL) 150 MG 24 hr tablet TAKE 1 TABLET (150 MG TOTAL) BY MOUTH DAILY. 90 tablet 3  . Cetirizine HCl (ZYRTEC PO) Take 1 tablet by mouth daily.    . Cholecalciferol (VITAMIN D3 PO) Take 2,000 Units by mouth daily.    . cyclobenzaprine (FLEXERIL) 10 MG tablet Take 0.5 tablets (5 mg total) by mouth 3 (three) times daily as needed for muscle spasms. 30 tablet 3  . ESOMEPRAZOLE MAGNESIUM PO Take 1 tablet by mouth daily.    . fluticasone (FLONASE) 50 MCG/ACT nasal spray PLACE 2 SPRAYS INTO THE NOSE DAILY AS NEEDED 48 g 3  . furosemide (LASIX) 20 MG tablet Take 1 tablet (20 mg total) by mouth 2 (two) times a week. Take one extra tablet daily as needed  for swelling 90 tablet 1  . loratadine (CLARITIN) 10 MG tablet Take 10 mg by mouth as needed for allergies. Reported on 05/19/2015    . meclizine (ANTIVERT) 25 MG tablet Take 25 mg by mouth as needed for dizziness.    . metoprolol succinate (TOPROL-XL) 25 MG 24 hr tablet Take 1 tablet (25 mg total) by mouth daily. [place]  tablet 3  . NON FORMULARY PLEXUS: PRO BIO 5    . potassium chloride (KLOR-CON) 10 MEQ tablet Take one tablet twice a week with lasix.  Take one extra tablet daily as needed with each extra lasix dose. 90 tablet 1  . Probiotic Product (PROBIOTIC PO) Take 1 tablet by mouth daily.    . valACYclovir (VALTREX) 1000 MG tablet Take 2 tablets (2,000 mg total) by mouth 2 (two) times daily. For one day for cold sore 12 tablet 1   No current facility-administered medications on file prior to visit.   Review of Systems  Constitutional: Positive for malaise/fatigue. Negative for chills and fever.  HENT: Positive for congestion and sinus pain. Negative for ear pain and sore throat.   Eyes: Negative for blurred vision, discharge and redness.  Respiratory: Positive for cough, sputum production and wheezing. Negative for shortness of breath and stridor.   Cardiovascular: Negative for chest pain, palpitations and leg swelling.  Gastrointestinal: Negative for abdominal pain, diarrhea, nausea and vomiting.  Musculoskeletal: Negative for myalgias.  Skin: Negative for rash.  Neurological: Positive for headaches. Negative for dizziness.    Observations/Objective: Patient appears well, in no distress Weight is baseline  No facial swelling or asymmetry Mildly hoarse voice No obvious tremor or mobility impairment Moving neck and UEs normally Able to hear the call well  Dry cough heard   No wheeze or sob audible  Talkative and mentally sharp with no cognitive changes No skin changes on face or neck , no rash or pallor Affect is normal    Assessment and Plan: Problem List Items Addressed This  Visit      Respiratory   Acute sinusitis - Primary    Suspect bacterial given length of illness Neg covid test Sent in zpak to start  Fluids/saline/rest  Continue flonase and mucinex if helpful Update if not starting to improve in a week or if worsening        Relevant Medications   Cetirizine HCl (ZYRTEC PO)   azithromycin (ZITHROMAX Z-PAK) 250 MG tablet       Follow Up Instructions: Take azithromycin for sinus infection  Continue symptomatic care (especialy sinus rinse)  Call if worse  Call if fever or short of breath   Update if not starting to improve in a week or if worsening     I discussed the assessment and treatment plan with the patient. The patient was provided an opportunity to ask questions and all were answered. The patient agreed with the plan and demonstrated an understanding of the instructions.   The patient was advised to call back or seek an in-person evaluation if the symptoms worsen or if the condition fails to improve as anticipated.     Loura Pardon, MD

## 2020-04-13 NOTE — Assessment & Plan Note (Signed)
Suspect bacterial given length of illness Neg covid test Sent in zpak to start  Fluids/saline/rest  Continue flonase and mucinex if helpful Update if not starting to improve in a week or if worsening

## 2020-04-13 NOTE — Patient Instructions (Signed)
Take azithromycin for sinus infection  Continue symptomatic care (especialy sinus rinse)  Call if worse  Call if fever or short of breath   Update if not starting to improve in a week or if worsening

## 2020-04-25 ENCOUNTER — Other Ambulatory Visit: Payer: Self-pay | Admitting: Internal Medicine

## 2020-08-05 ENCOUNTER — Telehealth: Payer: Self-pay | Admitting: Family Medicine

## 2020-08-07 NOTE — Telephone Encounter (Signed)
Med refilled once will route to [facility] to get appt scheduled

## 2020-08-07 NOTE — Telephone Encounter (Signed)
Please schedule PE or f/u (her pref) and refill until then

## 2020-08-07 NOTE — Telephone Encounter (Signed)
Pt hasn't had a CPE in over a year and no future appts., please advise

## 2020-08-08 NOTE — Telephone Encounter (Signed)
If it's been over a year she doesn't qualify for the welcome to medicare with PCP, so this would just be an AWV so please schedule the phone call with the nurse 1st, thanks

## 2020-08-08 NOTE — Telephone Encounter (Signed)
Patient scheduled AWV on 10/24/20. It will be over a year since she started Medicare and she didn't have her welcome to Medicare physical. Does she need to talk to the medicare nurse or do you want me to mail her medicare paperwork?

## 2020-08-08 NOTE — Telephone Encounter (Signed)
I left a detailed message on patient's voice mail to call back and schedule phone call with the medicare nurse.

## 2020-08-13 ENCOUNTER — Other Ambulatory Visit: Payer: Self-pay | Admitting: Family Medicine

## 2020-10-17 ENCOUNTER — Encounter: Payer: Self-pay | Admitting: Family Medicine

## 2020-10-17 ENCOUNTER — Ambulatory Visit (INDEPENDENT_AMBULATORY_CARE_PROVIDER_SITE_OTHER): Payer: Medicare Other

## 2020-10-17 ENCOUNTER — Other Ambulatory Visit: Payer: Self-pay

## 2020-10-17 DIAGNOSIS — R030 Elevated blood-pressure reading, without diagnosis of hypertension: Secondary | ICD-10-CM

## 2020-10-17 DIAGNOSIS — R7303 Prediabetes: Secondary | ICD-10-CM

## 2020-10-17 DIAGNOSIS — Z0000 Encounter for general adult medical examination without abnormal findings: Secondary | ICD-10-CM | POA: Diagnosis not present

## 2020-10-17 DIAGNOSIS — E7800 Pure hypercholesterolemia, unspecified: Secondary | ICD-10-CM

## 2020-10-17 DIAGNOSIS — K209 Esophagitis, unspecified without bleeding: Secondary | ICD-10-CM

## 2020-10-17 DIAGNOSIS — I272 Pulmonary hypertension, unspecified: Secondary | ICD-10-CM

## 2020-10-17 NOTE — Patient Instructions (Signed)
Kristine [name] , Thank you for taking time to come for your Medicare Wellness Visit. I appreciate your ongoing commitment to your health goals. Please review the following plan we discussed and let me know if I can assist you in the future.   Screening recommendations/referrals: Colonoscopy: Up to date, completed 06/07/2011, due 05/2021 Mammogram: Up to date, completed 07/10/2020, due 06/2021 Bone Density: due, will discuss with provider  Recommended yearly ophthalmology/optometry visit for glaucoma screening and checkup Recommended yearly dental visit for hygiene and checkup  Vaccinations: Influenza vaccine: due Fall 2022  Pneumococcal vaccine: due, will discus with provider  Tdap vaccine: Up to date, completed 04/03/2018, due 03/2028 Shingles vaccine: due, check with your insurance regarding coverage if interested    Covid-19:completed 3 vaccines, discussed second booster   Advanced directives: Please bring a copy of your POA (Power of [place]) and/or Living Will to your next appointment.  Conditions/risks identified: hypercholesterolemia   Next appointment: Follow up in one year for your annual wellness visit    Preventive Care 13 Years and Older, Female Preventive care refers to lifestyle choices and visits with your health care provider that can promote health and wellness. What does preventive care include?  A yearly physical exam. This is also called an annual well check.  Dental exams once or twice a year.  Routine eye exams. Ask your health care provider how often you should have your eyes checked.  Personal lifestyle choices, including:  Daily care of your teeth and gums.  Regular physical activity.  Eating a healthy diet.  Avoiding tobacco and drug use.  Limiting alcohol use.  Practicing safe sex.  Taking low-dose aspirin every day.  Taking vitamin and mineral supplements as recommended by your health care provider. What happens during an annual well check? The  services and screenings done by your health care provider during your annual well check will depend on your age, overall health, lifestyle risk factors, and family history of disease. Counseling  Your health care provider may ask you questions about your:  Alcohol use.  Tobacco use.  Drug use.  Emotional well-being.  Home and relationship well-being.  Sexual activity.  Eating habits.  History of falls.  Memory and ability to understand (cognition).  Work and work Statistician.  Reproductive health. Screening  You may have the following tests or measurements:  Height, weight, and BMI.  Blood pressure.  Lipid and cholesterol levels. These may be checked every 5 years, or more frequently if you are over 43 years old.  Skin check.  Lung cancer screening. You may have this screening every year starting at age 70 if you have a 30-pack-year history of smoking and currently smoke or have quit within the past 15 years.  Fecal occult blood test (FOBT) of the stool. You may have this test every year starting at age 60.  Flexible sigmoidoscopy or colonoscopy. You may have a sigmoidoscopy every 5 years or a colonoscopy every 10 years starting at age 50.  Hepatitis C blood test.  Hepatitis B blood test.  Sexually transmitted disease (STD) testing.  Diabetes screening. This is done by checking your blood sugar (glucose) after you have not eaten for a while (fasting). You may have this done every 1-3 years.  Bone density scan. This is done to screen for osteoporosis. You may have this done starting at age 59.  Mammogram. This may be done every 1-2 years. Talk to your health care provider about how often you should have regular mammograms. Talk  with your health care provider about your test results, treatment options, and if necessary, the need for more tests. Vaccines  Your health care provider may recommend certain vaccines, such as:  Influenza vaccine. This is recommended  every year.  Tetanus, diphtheria, and acellular pertussis (Tdap, Td) vaccine. You may need a Td booster every 10 years.  Zoster vaccine. You may need this after age 11.  Pneumococcal 13-valent conjugate (PCV13) vaccine. One dose is recommended after age 34.  Pneumococcal polysaccharide (PPSV23) vaccine. One dose is recommended after age 83. Talk to your health care provider about which screenings and vaccines you need and how often you need them. This information is not intended to replace advice given to you by your health care provider. Make sure you discuss any questions you have with your health care provider. Document Released: 05/26/2015 Document Revised: 01/17/2016 Document Reviewed: 02/28/2015 Elsevier Interactive Patient Education  2017 [place] Prevention in the Home Falls can cause injuries. They can happen to people of all ages. There are many things you can do to make your home safe and to help prevent falls. What can I do on the outside of my home?  Regularly fix the edges of walkways and driveways and fix any cracks.  Remove anything that might make you trip as you walk through a door, such as a raised step or threshold.  Trim any bushes or trees on the path to your home.  Use bright outdoor lighting.  Clear any walking paths of anything that might make someone trip, such as rocks or tools.  Regularly check to see if handrails are loose or broken. Make sure that both sides of any steps have handrails.  Any raised decks and porches should have guardrails on the edges.  Have any leaves, snow, or ice cleared regularly.  Use sand or salt on walking paths during winter.  Clean up any spills in your garage right away. This includes oil or grease spills. What can I do in the bathroom?  Use night lights.  Install grab bars by the toilet and in the tub and shower. Do not use towel bars as grab bars.  Use non-skid mats or decals in the tub or shower.  If  you need to sit down in the shower, use a plastic, non-slip stool.  Keep the floor dry. Clean up any water that spills on the floor as soon as it happens.  Remove soap buildup in the tub or shower regularly.  Attach bath mats securely with double-sided non-slip rug tape.  Do not have throw rugs and other things on the floor that can make you trip. What can I do in the bedroom?  Use night lights.  Make sure that you have a light by your bed that is easy to reach.  Do not use any sheets or blankets that are too big for your bed. They should not hang down onto the floor.  Have a firm chair that has side arms. You can use this for support while you get dressed.  Do not have throw rugs and other things on the floor that can make you trip. What can I do in the kitchen?  Clean up any spills right away.  Avoid walking on wet floors.  Keep items that you use a lot in easy-to-reach places.  If you need to reach something above you, use a strong step stool that has a grab bar.  Keep electrical cords out of the way.  Do  not use floor polish or wax that makes floors slippery. If you must use wax, use non-skid floor wax.  Do not have throw rugs and other things on the floor that can make you trip. What can I do with my stairs?  Do not leave any items on the stairs.  Make sure that there are handrails on both sides of the stairs and use them. Fix handrails that are broken or loose. Make sure that handrails are as long as the stairways.  Check any carpeting to make sure that it is firmly attached to the stairs. Fix any carpet that is loose or worn.  Avoid having throw rugs at the top or bottom of the stairs. If you do have throw rugs, attach them to the floor with carpet tape.  Make sure that you have a light switch at the top of the stairs and the bottom of the stairs. If you do not have them, ask someone to add them for you. What else can I do to help prevent falls?  Wear shoes  that:  Do not have high heels.  Have rubber bottoms.  Are comfortable and fit you well.  Are closed at the toe. Do not wear sandals.  If you use a stepladder:  Make sure that it is fully opened. Do not climb a closed stepladder.  Make sure that both sides of the stepladder are locked into place.  Ask someone to hold it for you, if possible.  Clearly mark and make sure that you can see:  Any grab bars or handrails.  First and last steps.  Where the edge of each step is.  Use tools that help you move around (mobility aids) if they are needed. These include:  Canes.  Walkers.  Scooters.  Crutches.  Turn on the lights when you go into a dark area. Replace any light bulbs as soon as they burn out.  Set up your furniture so you have a clear path. Avoid moving your furniture around.  If any of your floors are uneven, fix them.  If there are any pets around you, be aware of where they are.  Review your medicines with your doctor. Some medicines can make you feel dizzy. This can increase your chance of falling. Ask your doctor what other things that you can do to help prevent falls. This information is not intended to replace advice given to you by your health care provider. Make sure you discuss any questions you have with your health care provider. Document Released: 02/23/2009 Document Revised: 10/05/2015 Document Reviewed: 06/03/2014 Elsevier Interactive Patient Education  2017 [employer].

## 2020-10-17 NOTE — Progress Notes (Signed)
PCP notes:  Health Maintenance: Prevnar 13- due shingrix- due dexa- due    Abnormal Screenings: none   Patient concerns: Refill on meclizine 25 mg    Nurse concerns: none   Next PCP appt.: 10/24/2020 @ 11:30 am

## 2020-10-17 NOTE — Progress Notes (Signed)
Subjective:   Kristine [name] is a 65 y.o. female who presents for Medicare Annual (Subsequent) preventive examination.  Review of Systems: N/A      I connected with the patient today by telephone and verified that I am speaking with the correct person using two identifiers. Location patient: home Location nurse: work Persons participating in the telephone visit: patient, nurse.   I discussed the limitations, risks, security and privacy concerns of performing an evaluation and management service by telephone and the availability of in person appointments. I also discussed with the patient that there may be a patient responsible charge related to this service. The patient expressed understanding and verbally consented to this telephonic visit.        Cardiac Risk Factors include: advanced age (>72men, >78 women);Other (see comment), Risk factor comments: hypercholesterolemia     Objective:    Today's Vitals   There is no height or weight on file to calculate BMI.  Advanced Directives 10/17/2020  Does Patient Have a Medical Advance Directive? Yes  Type of Paramedic of [place];Living will  Copy of [place] in Chart? No - copy requested    Current Medications (verified) Outpatient Encounter Medications as of 10/17/2020  Medication Sig  . acetaminophen (TYLENOL) 500 MG tablet Take 500 mg by mouth every 6 (six) hours as needed.  Marland Kitchen acyclovir ointment (ZOVIRAX) 5 % Apply 1 application topically every 3 (three) hours. To cold sore as needed  . albuterol (PROVENTIL HFA;VENTOLIN HFA) 108 (90 Base) MCG/ACT inhaler INHALE 1-2 PUFFS INTO THE LUNGS EVERY 6 (SIX) HOURS AS NEEDED FOR WHEEZING OR SHORTNESS OF BREATH.  Marland Kitchen ALPRAZolam (XANAX) 0.25 MG tablet Take 1 tablet (0.25 mg total) by mouth at bedtime as needed for anxiety.  Marland Kitchen aspirin 81 MG tablet Take 81 mg by mouth daily.  Marland Kitchen azithromycin (ZITHROMAX Z-PAK) 250 MG tablet Take 2 pills by mouth  today and then 1 pill daily for 4 days  . buPROPion (WELLBUTRIN XL) 150 MG 24 hr tablet TAKE 1 TABLET BY MOUTH EVERY DAY  . Cetirizine HCl (ZYRTEC PO) Take 1 tablet by mouth daily.  . Cholecalciferol (VITAMIN D3 PO) Take 2,000 Units by mouth daily.  . cyclobenzaprine (FLEXERIL) 10 MG tablet Take 0.5 tablets (5 mg total) by mouth 3 (three) times daily as needed for muscle spasms.  Marland Kitchen ESOMEPRAZOLE MAGNESIUM PO Take 1 tablet by mouth daily.  . fluticasone (FLONASE) 50 MCG/ACT nasal spray PLACE 2 SPRAYS INTO THE NOSE DAILY AS NEEDED  . furosemide (LASIX) 20 MG tablet Take 1 tablet (20 mg total) by mouth 2 (two) times a week. Take one extra tablet daily as needed for swelling  . loratadine (CLARITIN) 10 MG tablet Take 10 mg by mouth as needed for allergies. Reported on 05/19/2015  . meclizine (ANTIVERT) 25 MG tablet Take 25 mg by mouth as needed for dizziness.  . metoprolol succinate (TOPROL-XL) 25 MG 24 hr tablet TAKE 1 TABLET BY MOUTH EVERY DAY  . NON FORMULARY PLEXUS: PRO BIO 5  . potassium chloride (KLOR-CON) 10 MEQ tablet Take one tablet twice a week with lasix.  Take one extra tablet daily as needed with each extra lasix dose.  . Probiotic Product (PROBIOTIC PO) Take 1 tablet by mouth daily.  . valACYclovir (VALTREX) 1000 MG tablet Take 2 tablets (2,000 mg total) by mouth 2 (two) times daily. For one day for cold sore   No facility-administered encounter medications on file as of 10/17/2020.  Allergies (verified) Oxycodone-acetaminophen, Paroxetine, Paroxetine hcl, Sulfa antibiotics, Amoxicillin-pot clavulanate, Amoxicillin-pot clavulanate, Calcium, Erythromycin, Guaifenesin, Naproxen, Oxycodone-acetaminophen, Oxycodone-acetaminophen, Oxycodone-aspirin, Oxycodone-aspirin, Sertraline hcl, Sertraline hcl, and Sertraline   History: Past Medical History:  Diagnosis Date  . Anxiety   . Basal cell cancer   . Basal cell carcinoma    face  . Depression   . DVT (deep venous thrombosis) ([place]) 1/08   . Esophageal erosions   . Factor V Leiden ([place])    On coag x 1 year ending in 09  . Gastritis 1990   hospital- abd pain  . GERD (gastroesophageal reflux disease)   . Radial head fracture 2011   Past Surgical History:  Procedure Laterality Date  . CHOLECYSTECTOMY    . COLONOSCOPY    . MOHS SURGERY  7/11   basal cell carcinoma near eye  . OOPHORECTOMY    . SHOULDER SURGERY  04/2006  . TONSILLECTOMY  1976  . UPPER GASTROINTESTINAL ENDOSCOPY     Family History  Problem Relation Age of Onset  . Graves' disease Mother   . Depression Mother   . Osteoporosis Mother   . Heart attack Father        x 3   . Breast cancer Sister   . Heart attack Brother        x 2  . Diabetes Brother   . Hypertension Brother   . Hypertension Sister   . Coronary artery disease Brother        with stents   . Atrial fibrillation Brother   . Colon cancer Neg Hx    Social History   Socioeconomic History  . Marital status: Married    Spouse name: Not on file  . Number of children: 1  . Years of education: Not on file  . Highest education level: Not on file  Occupational History  . Occupation: Hydrographic surveyor: [employer]  Tobacco Use  . Smoking status: Never Smoker  . Smokeless tobacco: Never Used  Vaping Use  . Vaping Use: Never used  Substance and Sexual Activity  . Alcohol use: Yes    Alcohol/week: 0.0 standard drinks    Comment: occ  . Drug use: No  . Sexual activity: Not on file  Other Topics Concern  . Not on file  Social History Narrative   Some walking for exercise         Hematology - Dr. Catarina Hartshorn - Dr. Latanya Maudlin   [facility]- [facility]- Dr. Rosana Berger   Social Determinants of Health   Financial Resource Strain: Low Risk   . Difficulty of Paying Living Expenses: Not hard at all  Food Insecurity: No Food Insecurity  . Worried About Charity fundraiser in the Last Year: Never true  . Ran Out of Food in the Last Year: Never true  Transportation Needs: No  Transportation Needs  . Lack of Transportation (Medical): No  . Lack of Transportation (Non-Medical): No  Physical Activity: Sufficiently Active  . Days of Exercise per Week: 4 days  . Minutes of Exercise per Session: 50 min  Stress: No Stress Concern Present  . 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  No  Does the patient have any non-healing wounds?  No  Has the patient had any unintentional weight loss or weight gain?  No   Diabetes:  Is the patient diabetic?  No  If diabetic, was a CBG obtained today?  N/A Did the patient bring in their glucometer from home?  N/A How often do you monitor your CBG's? N/A.   Financial Strains and Diabetes Management:  Are you having any financial strains with the device, your supplies or your medication? N/A.  Does the patient want to be seen by Chronic Care Management for management of their diabetes?  N/A Would the patient like to be referred to a Nutritionist or for Diabetic Management?  N/A  Interpreter Needed?: No  Information entered by :: CJohnson, LPN   Activities of Daily Living In your present state of health, do you have any difficulty performing the following activities: 10/17/2020  Hearing? N  Vision? N  Difficulty concentrating or  making decisions? N  Walking or climbing stairs? N  Dressing or bathing? N  Doing errands, shopping? N  Preparing Food and eating ? N  Using the Toilet? N  In the past six months, have you accidently leaked urine? N  Do you have problems with loss of bowel control? N  Managing your Medications? N  Managing your Finances? N  Housekeeping or managing your Housekeeping? N  Some recent data might be hidden    Patient Care Team: Tower, Wynelle Fanny, MD as PCP - General Fay Records, MD as PCP - Cardiology (Cardiology)  Indicate any recent Medical Services you may have received from other than [facility] providers in the past year (date may be approximate).     Assessment:   This is a routine wellness examination for Nika.  Hearing/Vision screen  Hearing Screening   125Hz  250Hz  500Hz  1000Hz  2000Hz  3000Hz  4000Hz  6000Hz  8000Hz   Right ear:           Left ear:           Vision Screening Comments: Patient gets annual eye exams   Dietary issues and exercise activities discussed: Current Exercise Habits: Home exercise routine, Type of exercise: walking, Time (Minutes): 45, Frequency (Times/Week): 4, Weekly Exercise (Minutes/Week): 180, Intensity: Moderate, Exercise limited by: None identified  Goals Addressed            This Visit's Progress   . Patient Stated       10/17/2020, I will continue to walk 4 days a week for 2 miles.      Depression Screen PHQ 2/9 Scores 10/17/2020 06/08/2019 04/03/2018 03/11/2017  PHQ - 2 Score 0 2 0 0  PHQ- 9 Score 0 2 - -    Fall Risk Fall Risk  10/17/2020  Falls in the past year? 0  Number falls in past yr: 0  Injury with Fall? 0  Risk for fall due to : Medication side effect  Follow up Falls evaluation completed;Falls prevention discussed    FALL RISK PREVENTION PERTAINING TO THE HOME:  Any stairs in or around the home? Yes  If so, are there any without handrails? No  Home free of loose throw rugs in walkways, pet beds, electrical cords, etc?  Yes  Adequate lighting in your home to reduce risk of falls? Yes   ASSISTIVE DEVICES UTILIZED TO PREVENT FALLS:  Life alert? No  Use of a cane, walker or w/c? No  Grab bars in the bathroom? No  Shower chair or bench in shower? No  Elevated toilet seat or a handicapped toilet? No   TIMED UP AND GO:  Was the test performed: N/A telephone visit .   Cognitive Function: MMSE - Mini Mental State Exam 10/17/2020  Orientation to time 5  Orientation to Place 5  Registration 3  Recall 3  Language- repeat 1       Mini Cog  Mini-Cog screen was completed. Maximum score is 22. A value of 0 denotes this part of the MMSE was not completed or the patient failed this part of the Mini-Cog screening.  Immunizations Immunization History  Administered Date(s) Administered  . Hepatitis B 05/13/1994  . Influenza Split 03/21/2011, 02/03/2013, 02/22/2014  . Influenza Whole 02/21/2005, 03/02/2007, 02/09/2009, 03/22/2010  . Influenza,inj,Quad PF,6+ Mos 03/29/2015, 03/05/2017, 02/19/2018, 03/02/2019  . Influenza-Unspecified 03/28/2015  . PFIZER(Purple Top)SARS-COV-2 Vaccination 07/10/2019, 07/30/2019, 02/23/2020  . Pneumococcal Polysaccharide-23 01/24/2003, 12/07/2007  . Pneumococcal-Unspecified 05/13/2013  . Td 12/07/2007  . Tdap 05/13/2013, 04/03/2018  . Unspecified SARS-COV-2 Vaccination 07/10/2019, 07/30/2019  . Zoster, Live 03/07/2016    TDAP status: Up to date  Flu Vaccine status: due Fall 2022  Pneumococcal vaccine status: Due, Education has been provided regarding the importance of this vaccine. Advised may receive this vaccine at local pharmacy or Health Dept. Aware to provide a copy of the vaccination record if obtained from local pharmacy or Health Dept. Verbalized acceptance and understanding.  Covid-19 vaccine status: 3 vaccines completed, discussed second booster   Qualifies for Shingles Vaccine? Yes   Zostavax completed Yes   Shingrix Completed?: No.    Education has been  provided regarding the importance of this vaccine. Patient has been advised to call insurance company to determine out of pocket expense if they have not yet received this vaccine. Advised may also receive vaccine at local pharmacy or Health Dept. Verbalized acceptance and understanding.  Screening Tests Health Maintenance  Topic Date Due  . Zoster Vaccines- Shingrix (1 of 2) Never done  . DEXA SCAN  Never done  . PNA vac Low Risk Adult (1 of 2 - PCV13) 08/24/2019  . INFLUENZA VACCINE  12/11/2020  . COLONOSCOPY (Pts 45-28yrs Insurance coverage will need to be confirmed)  06/06/2021  . MAMMOGRAM  07/10/2022  . TETANUS/TDAP  04/03/2028  . COVID-19 Vaccine  Completed  . Hepatitis C Screening  Completed  . Pneumococcal Vaccine 26-64 Years old  Aged Out  . HPV VACCINES  Aged Out    Health Maintenance  Health Maintenance Due  Topic Date Due  . Zoster Vaccines- Shingrix (1 of 2) Never done  . DEXA SCAN  Never done  . PNA vac Low Risk Adult (1 of 2 - PCV13) 08/24/2019    Colorectal cancer screening: Type of screening: Colonoscopy. Completed 06/07/2011. Repeat every 10 years  Mammogram status: Completed 07/10/2020. Repeat every year  Bone Density status: due, will discuss with provider   Lung Cancer Screening: (Low Dose CT Chest recommended if Age 26-80 years, 30 pack-year currently smoking OR have quit w/in 15 years.) does not qualify.   Additional Screening:  Hepatitis C Screening: does qualify; Completed 12/27/2015  Vision Screening: Recommended annual ophthalmology exams for  early detection of glaucoma and other disorders of the eye. Is the patient up to date with their annual eye exam?  Yes  Who is the provider or what is the name of the office in which the patient attends annual eye exams? Dr. Dyanne Carrel  If pt is not established with a provider, would they like to be referred to a provider to establish care? No .   Dental Screening: Recommended annual dental exams for proper  oral hygiene  Community Resource Referral / Chronic Care Management: CRR required this visit?  No   CCM required this visit?  No      Plan:     I have personally reviewed and noted the following in the patient's chart:   . Medical and social history . Use of alcohol, tobacco or illicit drugs  . Current medications and supplements including opioid prescriptions.  . Functional ability and status . Nutritional status . Physical activity . Advanced directives . List of other physicians . Hospitalizations, surgeries, and ER visits in previous 12 months . Vitals . Screenings to include cognitive, depression, and falls . Referrals and appointments  In addition, I have reviewed and discussed with patient certain preventive protocols, quality metrics, and best practice recommendations. A written personalized care plan for preventive services as well as general preventive health recommendations were provided to patient.   Due to this being a telephonic visit, the after visit summary with patients personalized plan was offered to patient via office or my-chart. Patient preferred to pick up at office at next visit or via mychart.   Andrez Grime, LPN   10/13/8464

## 2020-10-19 ENCOUNTER — Other Ambulatory Visit: Payer: Self-pay

## 2020-10-19 ENCOUNTER — Other Ambulatory Visit (INDEPENDENT_AMBULATORY_CARE_PROVIDER_SITE_OTHER): Payer: Medicare Other

## 2020-10-19 DIAGNOSIS — R7303 Prediabetes: Secondary | ICD-10-CM

## 2020-10-19 DIAGNOSIS — E7800 Pure hypercholesterolemia, unspecified: Secondary | ICD-10-CM

## 2020-10-19 DIAGNOSIS — R030 Elevated blood-pressure reading, without diagnosis of hypertension: Secondary | ICD-10-CM

## 2020-10-19 DIAGNOSIS — K209 Esophagitis, unspecified without bleeding: Secondary | ICD-10-CM

## 2020-10-19 DIAGNOSIS — I272 Pulmonary hypertension, unspecified: Secondary | ICD-10-CM | POA: Diagnosis not present

## 2020-10-19 LAB — COMPREHENSIVE METABOLIC PANEL
ALT: 15 U/L (ref 0–35)
AST: 17 U/L (ref 0–37)
Albumin: 4.2 g/dL (ref 3.5–5.2)
Alkaline Phosphatase: 65 U/L (ref 39–117)
BUN: 14 mg/dL (ref 6–23)
CO2: 28 mEq/L (ref 19–32)
Calcium: 9 mg/dL (ref 8.4–10.5)
Chloride: 103 mEq/L (ref 96–112)
Creatinine, Ser: 0.72 mg/dL (ref 0.40–1.20)
GFR: 87.29 mL/min (ref >60.00)
Glucose, Bld: 129 mg/dL — ABNORMAL HIGH (ref 70–99)
Potassium: 4 mEq/L (ref 3.5–5.1)
Sodium: 138 mEq/L (ref 135–145)
Total Bilirubin: 0.8 mg/dL (ref 0.2–1.2)
Total Protein: 6.6 g/dL (ref 6.0–8.3)

## 2020-10-19 LAB — CBC WITH DIFFERENTIAL/PLATELET
Basophils Absolute: 0.1 10*3/uL (ref 0.0–0.1)
Basophils Relative: 1.7 % (ref 0.0–3.0)
Eosinophils Absolute: 0.2 10*3/uL (ref 0.0–0.7)
Eosinophils Relative: 3.1 % (ref 0.0–5.0)
HCT: 39.6 % (ref 36.0–46.0)
Hemoglobin: 13.4 g/dL (ref 12.0–15.0)
Lymphocytes Relative: 27.8 % (ref 12.0–46.0)
Lymphs Abs: 1.6 10*3/uL (ref 0.7–4.0)
MCHC: 33.7 g/dL (ref 30.0–36.0)
MCV: 84.2 fl (ref 78.0–100.0)
Monocytes Absolute: 0.4 10*3/uL (ref 0.1–1.0)
Monocytes Relative: 7.3 % (ref 3.0–12.0)
Neutro Abs: 3.4 10*3/uL (ref 1.4–7.7)
Neutrophils Relative %: 60.1 % (ref 43.0–77.0)
Platelets: 178 10*3/uL (ref 150.0–400.0)
RBC: 4.7 Mil/uL (ref 3.87–5.11)
RDW: 13.4 % (ref 11.5–15.5)
WBC: 5.7 10*3/uL (ref 4.0–10.5)

## 2020-10-19 LAB — LIPID PANEL
Cholesterol: 207 mg/dL — ABNORMAL HIGH (ref 0–200)
HDL: 66.2 mg/dL (ref >39.00)
LDL Cholesterol: 123 mg/dL — ABNORMAL HIGH (ref 0–99)
NonHDL: 141.01
Total CHOL/HDL Ratio: 3
Triglycerides: 90 mg/dL (ref 0.0–149.0)
VLDL: 18 mg/dL (ref 0.0–40.0)

## 2020-10-19 LAB — HEMOGLOBIN A1C: Hgb A1c MFr Bld: 6.2 % (ref 4.6–6.5)

## 2020-10-23 LAB — CARDIO IQ (R) APOLIPOPROTEIN B: Apolipoprotein B: 98 mg/dL — ABNORMAL HIGH (ref <90)

## 2020-10-24 ENCOUNTER — Encounter: Payer: Self-pay | Admitting: Family Medicine

## 2020-10-24 ENCOUNTER — Ambulatory Visit (INDEPENDENT_AMBULATORY_CARE_PROVIDER_SITE_OTHER): Payer: Medicare Other | Admitting: Family Medicine

## 2020-10-24 ENCOUNTER — Other Ambulatory Visit: Payer: Self-pay

## 2020-10-24 ENCOUNTER — Other Ambulatory Visit: Payer: Medicare Other

## 2020-10-24 VITALS — BP 143/72 | HR 64 | Temp 97.5°F | Ht 66.0 in | Wt 201.7 lb

## 2020-10-24 DIAGNOSIS — E7800 Pure hypercholesterolemia, unspecified: Secondary | ICD-10-CM | POA: Diagnosis not present

## 2020-10-24 DIAGNOSIS — D6851 Activated protein C resistance: Secondary | ICD-10-CM

## 2020-10-24 DIAGNOSIS — E6609 Other obesity due to excess calories: Secondary | ICD-10-CM

## 2020-10-24 DIAGNOSIS — K2100 Gastro-esophageal reflux disease with esophagitis, without bleeding: Secondary | ICD-10-CM

## 2020-10-24 DIAGNOSIS — F418 Other specified anxiety disorders: Secondary | ICD-10-CM

## 2020-10-24 DIAGNOSIS — I10 Essential (primary) hypertension: Secondary | ICD-10-CM | POA: Diagnosis not present

## 2020-10-24 DIAGNOSIS — R7303 Prediabetes: Secondary | ICD-10-CM

## 2020-10-24 DIAGNOSIS — E2839 Other primary ovarian failure: Secondary | ICD-10-CM | POA: Insufficient documentation

## 2020-10-24 DIAGNOSIS — J310 Chronic rhinitis: Secondary | ICD-10-CM

## 2020-10-24 DIAGNOSIS — Z23 Encounter for immunization: Secondary | ICD-10-CM

## 2020-10-24 DIAGNOSIS — Z6833 Body mass index (BMI) 33.0-33.9, adult: Secondary | ICD-10-CM

## 2020-10-24 MED ORDER — MECLIZINE HCL 25 MG PO TABS: 25.0000 mg | ORAL_TABLET | ORAL | 1 refills | Status: DC | PRN

## 2020-10-24 MED ORDER — BUPROPION HCL ER (XL) 150 MG PO TB24: ORAL_TABLET | ORAL | 0 refills | Status: DC

## 2020-10-24 MED ORDER — ALPRAZOLAM 0.25 MG PO TABS: 0.2500 mg | ORAL_TABLET | Freq: Every evening | ORAL | 0 refills | Status: DC | PRN

## 2020-10-24 NOTE — Assessment & Plan Note (Signed)
Stable with wellbutrin  Discussed stressors  Enc her to get back to exercise

## 2020-10-24 NOTE — Assessment & Plan Note (Signed)
Pt continues cardiology f/u No clinical changes Takes 81 mg asa

## 2020-10-24 NOTE — Assessment & Plan Note (Signed)
Disc goals for lipids and reasons to control them Rev last labs with pt Rev low sat fat diet in detail Holding simvastatin for now  Seeing cardiology CT ca coronary score was 0 Apolipoprotein B is elevated  Planning f/u cardiology and may re start statin  LDL is up to 123

## 2020-10-24 NOTE — Assessment & Plan Note (Signed)
Lab Results  Component Value Date   HGBA1C 6.2 10/19/2020   This is up  disc imp of low glycemic diet and wt loss to prevent DM2

## 2020-10-24 NOTE — Progress Notes (Signed)
Dr Glori Bickers sent me recent labs   A1C mildly increased   Chol  not as good as was previous (when on statin)   Apo B is minimally elevated (another lipid particle) HEr calcium score was 0 but she has minor calcifications of aorta (minimal plaque on main artery) Given all of this I would recomm going back on simvistatin.  Could cut in 1/2    Follow    Watch carbs  / sugars

## 2020-10-24 NOTE — Progress Notes (Signed)
Subjective:    Patient ID: Kristine [name], female    DOB: 05-04-1956, 65 y.o.   MRN: [id number]  This visit occurred during the SARS-CoV-2 public health emergency.  Safety protocols were in place, including screening questions prior to the visit, additional usage of staff PPE, and extensive cleaning of exam room while observing appropriate contact time as indicated for disinfecting solutions.   HPI Pt presents for annual f/u of chronic health problems   Wt Readings from Last 3 Encounters:  10/24/20 201 lb 11.2 oz (91.5 kg)  03/13/20 198 lb 12.8 oz (90.2 kg)  06/08/19 201 lb 4 oz (91.3 kg)   32.56 kg/m  Doing ok  Trying to get back to normal with pandemic  A transitional time  Took care of husband with a hip replacement (tough)  May be starting a part time job   Walking for exercise  [facility]   Amw was done 6/7  Shingrix - interested in this if covered (has had a mild case of shingles)  Covid vaccinated with booster Flu shot -yearly   Prevnar 20 -due for /will get that today   Dexa - interested  Mother had OP (late)  No falls or fractures    Mammogram 2/22 Self breast exam - no lumps   Colonoscopy 1/13  Due for cologuard again 9/24  In a study for that with mayo    H/o pulmonary HTN-no longer has  Also palpitations and LE edema  BP Readings from Last 3 Encounters:  10/24/20 (!) 143/72  04/13/20 (!) 144/72  03/13/20 (!) 142/80    Pulse Readings from Last 3 Encounters:  10/24/20 64  03/13/20 67  06/08/19 72   Sees cardiology  Takes toprol xl 25 mg daily  Lasix 20 mg twice weekly with K supplement  Per pt bp is improved   Has a sinus headache today- may be inc her bp  Allergies are worse  Still has post nasal drip and hears ringing in her ears  Takes guaifenesin  Needs to go back to steroid n   GERD with past esophagitis  Takes esomeprazole   H/o factor 5 leiden mutation  Takes 81 mg asa daily    Mood (h/o dep and anx)  Takes wellbutrin xl 150 mg daily  She was in a funk when husband was due to his job    Hyperlipidemia Lab Results  Component Value Date   CHOL 207 (H) 10/19/2020   CHOL 155 06/03/2019   CHOL 148 03/30/2018   Lab Results  Component Value Date   HDL 66.20 10/19/2020   HDL 70.30 06/03/2019   HDL 60.50 03/30/2018   Lab Results  Component Value Date   LDLCALC 123 (H) 10/19/2020   LDLCALC 70 06/03/2019   LDLCALC 68 03/30/2018   Lab Results  Component Value Date   TRIG 90.0 10/19/2020   TRIG 75.0 06/03/2019   TRIG 97.0 03/30/2018   Lab Results  Component Value Date   CHOLHDL 3 10/19/2020   CHOLHDL 2 06/03/2019   CHOLHDL 2 03/30/2018   Lab Results  Component Value Date   LDLDIRECT 146.1 08/19/2006  Past statin -was worried about glucose    Coronary ca score 0 in the fall   Apolipoprotein B 98  May end up going back to simvastatin  She was stress eating for a while, better now   Prediabetes Lab Results  Component Value Date   HGBA1C 6.2 10/19/2020  Was  6.0   Other labs Lab Results  Component Value Date   CREATININE 0.72 10/19/2020   BUN 14 10/19/2020   NA 138 10/19/2020   K 4.0 10/19/2020   CL 103 10/19/2020   CO2 28 10/19/2020   Lab Results  Component Value Date   ALT 15 10/19/2020   AST 17 10/19/2020   ALKPHOS 65 10/19/2020   BILITOT 0.8 10/19/2020   Lab Results  Component Value Date   WBC 5.7 10/19/2020   HGB 13.4 10/19/2020   HCT 39.6 10/19/2020   MCV 84.2 10/19/2020   PLT 178.0 10/19/2020   Lab Results  Component Value Date   TSH 2.32 06/03/2019     Patient Active Problem List   Diagnosis Date Noted   Estrogen deficiency 10/24/2020   History of pulmonary hypertension 06/08/2019   History of shingles 01/21/2018   Prediabetes 04/21/2015   Stress reaction 04/21/2015   Essential hypertension 04/21/2015   Migraine with aura 06/21/2013   Obesity 06/16/2011   Rhinitis 06/14/2011   Routine general medical  examination at a health care facility 06/14/2011   SCIATICA 05/22/2009   GASTROESOPHAGEAL REFLUX DISEASE 04/14/2009   Factor 5 Leiden mutation, heterozygous ([place]) 12/07/2007   Depression with anxiety 11/25/2006   Esophagitis 11/25/2006   IBS 11/25/2006   DVT, HX OF 11/25/2006   MIGRAINES, HX OF 11/25/2006   HYPERCHOLESTEROLEMIA, PURE 10/29/2006   Past Medical History:  Diagnosis Date   Anxiety    Basal cell cancer    Basal cell carcinoma    face   Depression    DVT (deep venous thrombosis) ([place]) 1/08   Esophageal erosions    Factor V Leiden ([place])    On coag x 1 year ending in [place]   hospital- abd pain   GERD (gastroesophageal reflux disease)    Radial head fracture 2011   Past Surgical History:  Procedure Laterality Date   CHOLECYSTECTOMY     COLONOSCOPY     MOHS SURGERY  7/11   basal cell carcinoma near eye   OOPHORECTOMY     SHOULDER SURGERY  04/2006   TONSILLECTOMY  1976   UPPER GASTROINTESTINAL ENDOSCOPY     Social History   Tobacco Use   Smoking status: Never   Smokeless tobacco: Never  Vaping Use   Vaping Use: Never used  Substance Use Topics   Alcohol use: Yes    Alcohol/week: 0.0 standard drinks    Comment: occ   Drug use: No   Family History  Problem Relation Age of Onset   Graves' disease Mother    Depression Mother    Osteoporosis Mother    Heart attack Father        x 3    Breast cancer Sister    Heart attack Brother        x 2   Diabetes Brother    Hypertension Brother    Hypertension Sister    Coronary artery disease Brother        with stents    Atrial fibrillation Brother    Colon cancer Neg Hx    Allergies  Allergen Reactions   Oxycodone-Acetaminophen Rash and Other (See Comments)   Paroxetine Hives   Paroxetine Hcl Hives   Sulfa Antibiotics Other (See Comments)   Amoxicillin-Pot Clavulanate     REACTION: GI   Amoxicillin-Pot Clavulanate Other (See Comments)    REACTION: GI   Calcium     REACTION: muscle  aches   Erythromycin Other (  See Comments)    REACTION: GI   Guaifenesin     REACTION: GI   Naproxen     REACTION: GI   Oxycodone-Acetaminophen     REACTION: reaction not known   Oxycodone-Acetaminophen Other (See Comments)    Other Reaction: Not Assessed   Oxycodone-Aspirin     REACTION: reaction not known   Oxycodone-Aspirin     REACTION: reaction not known    Sertraline Hcl     REACTION: hives   Sertraline Hcl    Sertraline Rash and Other (See Comments)    REACTION: hives   Current Outpatient Medications on File Prior to Visit  Medication Sig Dispense Refill   acetaminophen (TYLENOL) 500 MG tablet Take 500 mg by mouth every 6 (six) hours as needed.     acyclovir ointment (ZOVIRAX) 5 % Apply 1 application topically every 3 (three) hours. To cold sore as needed 5 g 3   albuterol (PROVENTIL HFA;VENTOLIN HFA) 108 (90 Base) MCG/ACT inhaler INHALE 1-2 PUFFS INTO THE LUNGS EVERY 6 (SIX) HOURS AS NEEDED FOR WHEEZING OR SHORTNESS OF BREATH. 8.5 Inhaler 3   aspirin 81 MG tablet Take 81 mg by mouth daily.     Cetirizine HCl (ZYRTEC PO) Take 1 tablet by mouth daily.     Cholecalciferol (VITAMIN D3 PO) Take 2,000 Units by mouth daily.     cyclobenzaprine (FLEXERIL) 10 MG tablet Take 0.5 tablets (5 mg total) by mouth 3 (three) times daily as needed for muscle spasms. 30 tablet 3   ESOMEPRAZOLE MAGNESIUM PO Take 1 tablet by mouth daily.     fluticasone (FLONASE) 50 MCG/ACT nasal spray PLACE 2 SPRAYS INTO THE NOSE DAILY AS NEEDED 48 g 3   furosemide (LASIX) 20 MG tablet Take 1 tablet (20 mg total) by mouth 2 (two) times a week. Take one extra tablet daily as needed for swelling 90 tablet 1   loratadine (CLARITIN) 10 MG tablet Take 10 mg by mouth as needed for allergies. Reported on 05/19/2015     metoprolol succinate (TOPROL-XL) 25 MG 24 hr tablet TAKE 1 TABLET BY MOUTH EVERY DAY 90 tablet 3   NON FORMULARY PLEXUS: PRO BIO 5     potassium chloride (KLOR-CON) 10 MEQ tablet Take one tablet twice a  week with lasix.  Take one extra tablet daily as needed with each extra lasix dose. 90 tablet 1   Probiotic Product (PROBIOTIC PO) Take 1 tablet by mouth daily.     valACYclovir (VALTREX) 1000 MG tablet Take 2 tablets (2,000 mg total) by mouth 2 (two) times daily. For one day for cold sore 12 tablet 1   No current facility-administered medications on file prior to visit.    Review of Systems  Constitutional:  Negative for activity change, appetite change, fatigue, fever and unexpected weight change.  HENT:  Negative for congestion, ear pain, rhinorrhea, sinus pressure and sore throat.   Eyes:  Negative for pain, redness and visual disturbance.  Respiratory:  Negative for cough, shortness of breath and wheezing.   Cardiovascular:  Negative for chest pain and palpitations.  Gastrointestinal:  Negative for abdominal pain, blood in stool, constipation and diarrhea.  Endocrine: Negative for polydipsia, polyphagia and polyuria.  Genitourinary:  Negative for dysuria, frequency and urgency.  Musculoskeletal:  Negative for arthralgias, back pain and myalgias.  Skin:  Negative for pallor and rash.  Allergic/Immunologic: Negative for environmental allergies.  Neurological:  Negative for dizziness, syncope and headaches.  Hematological:  Negative for adenopathy. Does not bruise/bleed  easily.  Psychiatric/Behavioral:  Negative for decreased concentration and dysphoric mood. The patient is not nervous/anxious.        Stressors noted      Objective:   Physical Exam Constitutional:      General: She is not in acute distress.    Appearance: Normal appearance. She is well-developed. She is obese. She is not ill-appearing or diaphoretic.  HENT:     Head: Normocephalic and atraumatic.     Right Ear: Tympanic membrane, ear canal and external ear normal.     Left Ear: Tympanic membrane, ear canal and external ear normal.     Nose: Nose normal. No congestion.     Mouth/Throat:     Mouth: Mucous membranes  are moist.     Pharynx: Oropharynx is clear. No posterior oropharyngeal erythema.  Eyes:     General: No scleral icterus.    Extraocular Movements: Extraocular movements intact.     Conjunctiva/sclera: Conjunctivae normal.     Pupils: Pupils are equal, round, and reactive to light.  Neck:     Thyroid: No thyromegaly.     Vascular: No carotid bruit or JVD.  Cardiovascular:     Rate and Rhythm: Normal rate and regular rhythm.     Pulses: Normal pulses.     Heart sounds: Normal heart sounds.    No gallop.  Pulmonary:     Effort: Pulmonary effort is normal. No respiratory distress.     Breath sounds: Normal breath sounds. No wheezing.     Comments: Good air exch Chest:     Chest wall: No tenderness.  Abdominal:     General: Bowel sounds are normal. There is no distension or abdominal bruit.     Palpations: Abdomen is soft. There is no mass.     Tenderness: There is no abdominal tenderness.     Hernia: No hernia is present.  Genitourinary:    Comments: Breast exam: No mass, nodules, thickening, tenderness, bulging, retraction, inflamation, nipple discharge or skin changes noted.  No axillary or clavicular LA.     Musculoskeletal:        General: No tenderness. Normal range of motion.     Cervical back: Normal range of motion and neck supple. No rigidity. No muscular tenderness.     Right lower leg: No edema.     Left lower leg: No edema.     Comments: No kyphosis   Lymphadenopathy:     Cervical: No cervical adenopathy.  Skin:    General: Skin is warm and dry.     Coloration: Skin is not pale.     Findings: No erythema or rash.  Neurological:     Mental Status: She is alert. Mental status is at baseline.     Cranial Nerves: No cranial nerve deficit.     Motor: No abnormal muscle tone.     Coordination: Coordination normal.     Gait: Gait normal.     Deep Tendon Reflexes: Reflexes are normal and symmetric. Reflexes normal.  Psychiatric:        Mood and Affect: Mood normal.         Cognition and Memory: Cognition and memory normal.          Assessment & Plan:   Problem List Items Addressed This Visit       Cardiovascular and Mediastinum   Essential hypertension - Primary    bp is slt elevated (per pt better at home) Plan to continue  toprol xl 25 mg  daily  Lasix prn (seldom uses) 20 mg  For cardiology f/u Will check bp at home  Today she has HA and thinks this makes her worse Consider change of lasix to hctz or chlorthalidone if still elevated          Respiratory   Rhinitis    More sinus trouble  Enc her to get back to daily flonase          Digestive   GASTROESOPHAGEAL REFLUX DISEASE    Pt continues esomeprazole         Relevant Medications   meclizine (ANTIVERT) 25 MG tablet     Hematopoietic and Hemostatic   Factor 5 Leiden mutation, heterozygous ([place])    Pt continues cardiology f/u No clinical changes Takes 81 mg asa         Other   HYPERCHOLESTEROLEMIA, PURE    Disc goals for lipids and reasons to control them Rev last labs with pt Rev low sat fat diet in detail Holding simvastatin for now  Seeing cardiology CT ca coronary score was 0 Apolipoprotein B is elevated  Planning f/u cardiology and may re start statin  LDL is up to 123       Depression with anxiety    Stable with wellbutrin  Discussed stressors  Enc her to get back to exercise        Relevant Medications   buPROPion (WELLBUTRIN XL) 150 MG 24 hr tablet   ALPRAZolam (XANAX) 0.25 MG tablet   Obesity    Discussed how this problem influences overall health and the risks it imposes  Reviewed plan for weight loss with lower calorie diet (via better food choices and also portion control or program like weight watchers) and exercise building up to or more than 30 minutes 5 days per week including some aerobic activity          Prediabetes    Lab Results  Component Value Date   HGBA1C 6.2 10/19/2020  This is up  disc imp of low glycemic diet  and wt loss to prevent DM2        Estrogen deficiency    Referred for dexa  Enc to supplement vit D       Relevant Orders   DG Bone Density   Other Visit Diagnoses     Need for vaccination against Streptococcus pneumoniae       Relevant Orders   Pneumococcal conjugate vaccine 20-valent (Prevnar 20) (Completed)

## 2020-10-24 NOTE — Assessment & Plan Note (Addendum)
bp is slt elevated (per pt better at home) Plan to continue  toprol xl 25 mg daily  Lasix prn (seldom uses) 20 mg  For cardiology f/u Will check bp at home  Today she has HA and thinks this makes her worse Consider change of lasix to hctz or chlorthalidone if still elevated

## 2020-10-24 NOTE — Assessment & Plan Note (Addendum)
Referred for dexa  Enc to supplement vit D

## 2020-10-24 NOTE — Assessment & Plan Note (Signed)
More sinus trouble  Enc her to get back to daily flonase

## 2020-10-24 NOTE — Assessment & Plan Note (Signed)
Discussed how this problem influences overall health and the risks it imposes  Reviewed plan for weight loss with lower calorie diet (via better food choices and also portion control or program like weight watchers) and exercise building up to or more than 30 minutes 5 days per week including some aerobic activity    

## 2020-10-24 NOTE — Assessment & Plan Note (Signed)
Pt continues esomeprazole

## 2020-10-24 NOTE — Patient Instructions (Addendum)
If you are interested in the new shingles vaccine (Shingrix) - call your local pharmacy to check on coverage and availability  If affordable, get on a wait list at your pharmacy to get the vaccine.  Call and make your bone density test appt when convenient   Follow up with cardiology as planned    For diabetes prevention  Try to get most of your carbohydrates from produce (with the exception of white potatoes)  Eat less bread/pasta/rice/snack foods/cereals/sweets and other items from the middle of the grocery store (processed carbs)  Check blood pressure at home without a headache  Goal is under 330 systolic/ 90 diastolic

## 2020-11-09 ENCOUNTER — Other Ambulatory Visit: Payer: Self-pay | Admitting: Family Medicine

## 2020-12-04 ENCOUNTER — Telehealth: Payer: Self-pay | Admitting: *Deleted

## 2020-12-04 ENCOUNTER — Other Ambulatory Visit: Payer: Self-pay | Admitting: Obstetrics and Gynecology

## 2020-12-04 DIAGNOSIS — Z9189 Other specified personal risk factors, not elsewhere classified: Secondary | ICD-10-CM

## 2020-12-04 MED ORDER — SIMVASTATIN 10 MG PO TABS: 10.0000 mg | ORAL_TABLET | Freq: Every day | ORAL | 3 refills | Status: DC

## 2020-12-04 NOTE — Telephone Encounter (Signed)
-----   Message from Fay Records, MD sent at 10/24/2020 10:15 PM EDT -----    ----- Message ----- From: Abner Greenspan, MD Sent: 10/24/2020   1:47 PM EDT To: Fay Records, MD  See apolipoprotein B (98)

## 2020-12-04 NOTE — Telephone Encounter (Signed)
Left message for pt to call back.  Message from Dr. Harrington Challenger:   Dr Glori Bickers sent me recent labs   A1C mildly increased   Chol  not as good as was previous (when on statin)   Apo B is minimally elevated (another lipid particle) HEr calcium score was 0 but she has minor calcifications of aorta (minimal plaque on main artery) Given all of this I would recomm going back on simvistatin.  Could cut in 1/2    Follow    Watch carbs  / sugars

## 2020-12-04 NOTE — Telephone Encounter (Signed)
Pt will restart simvastatin at 10 mg.  She prefers to get labs checked at PCP office when it is time. It is close to her home.  She will reach out to Dr. Glori Bickers to arrange.   Pt wanted to report that she saw Dr. Helane Rima today.  Her BP was 118/80 - she wanted Dr. Harrington Challenger to know.  Encouraged her to keep up the excellent work.  She is walking and going to the gym.

## 2020-12-17 ENCOUNTER — Ambulatory Visit
Admission: RE | Admit: 2020-12-17 | Discharge: 2020-12-17 | Disposition: A | Discharge status: Home / Self Care | Payer: Medicare Other | Source: Ambulatory Visit | Attending: Obstetrics and Gynecology | Admitting: Obstetrics and Gynecology

## 2020-12-17 DIAGNOSIS — Z9189 Other specified personal risk factors, not elsewhere classified: Secondary | ICD-10-CM

## 2020-12-17 MED ORDER — GADOBUTROL 1 MMOL/ML IV SOLN
9.0000 mL | Freq: Once | INTRAVENOUS | Status: AC | PRN
  Administered 2020-12-17: 9 mL via INTRAVENOUS

## 2020-12-21 ENCOUNTER — Telehealth: Payer: Self-pay | Admitting: *Deleted

## 2020-12-21 ENCOUNTER — Ambulatory Visit
Admission: RE | Admit: 2020-12-21 | Discharge: 2020-12-21 | Disposition: A | Discharge status: Home / Self Care | Payer: Medicare Other | Source: Ambulatory Visit | Attending: Urgent Care | Admitting: Urgent Care

## 2020-12-21 ENCOUNTER — Other Ambulatory Visit: Payer: Self-pay

## 2020-12-21 VITALS — BP 170/79 | HR 69 | Temp 97.8°F | Resp 16

## 2020-12-21 DIAGNOSIS — M79601 Pain in right arm: Secondary | ICD-10-CM

## 2020-12-21 DIAGNOSIS — Z86718 Personal history of other venous thrombosis and embolism: Secondary | ICD-10-CM

## 2020-12-21 DIAGNOSIS — D6851 Activated protein C resistance: Secondary | ICD-10-CM

## 2020-12-21 DIAGNOSIS — I808 Phlebitis and thrombophlebitis of other sites: Secondary | ICD-10-CM

## 2020-12-21 DIAGNOSIS — I82611 Acute embolism and thrombosis of superficial veins of right upper extremity: Secondary | ICD-10-CM

## 2020-12-21 MED ORDER — CEPHALEXIN 500 MG PO CAPS: 500.0000 mg | ORAL_CAPSULE | Freq: Three times a day (TID) | ORAL | 0 refills | Status: DC

## 2020-12-21 NOTE — Discharge Instructions (Addendum)
Please report to [facility].  He have an appointment scheduled for an ultrasound of the right arm to rule out a blood.  The appointment is at 11 AM.  Please try to show up 15 minutes early for your appointment.  Do not register as a patient through the emergency room.  Go to the front desk and let them know that you are there for an outpatient ultrasound to rule out a blood clot.  In the meantime you can try compresses, icing, Tylenol.  If your ultrasound is negative I will talk with you about your results and let she know that we are going to pursue an antibiotic course to address possible infection.

## 2020-12-21 NOTE — Telephone Encounter (Signed)
Patient has an appointment at [place] today at 4:00 pm

## 2020-12-21 NOTE — Telephone Encounter (Signed)
Aware, will watch for correspondence  

## 2020-12-21 NOTE — ED Provider Notes (Signed)
[place]   MRN: [id number] DOB: 06-Jan-1956  Subjective:   Kristine [name] is a 65 y.o. female presenting for 5-day history of acute onset persistent worsening pain, redness, inflammation of the right arm.  Patient had an IV done to this arm for an MRI.  States that symptoms started after.  She does have a history of DVT, factor V Leyden mutation.  Has been using Tylenol and compresses with minimal relief.  She is also concerned about infection.  Denies chest pain, shortness of breath, heart racing, fevers.  No current facility-administered medications for this encounter.  Current Outpatient Medications:    acetaminophen (TYLENOL) 500 MG tablet, Take 500 mg by mouth every 6 (six) hours as needed., Disp: , Rfl:    acyclovir ointment (ZOVIRAX) 5 %, Apply 1 application topically every 3 (three) hours. To cold sore as needed, Disp: 5 g, Rfl: 3   albuterol (PROVENTIL HFA;VENTOLIN HFA) 108 (90 Base) MCG/ACT inhaler, INHALE 1-2 PUFFS INTO THE LUNGS EVERY 6 (SIX) HOURS AS NEEDED FOR WHEEZING OR SHORTNESS OF BREATH., Disp: 8.5 Inhaler, Rfl: 3   ALPRAZolam (XANAX) 0.25 MG tablet, Take 1 tablet (0.25 mg total) by mouth at bedtime as needed for anxiety., Disp: 30 tablet, Rfl: 0   aspirin 81 MG tablet, Take 81 mg by mouth daily., Disp: , Rfl:    buPROPion (WELLBUTRIN XL) 150 MG 24 hr tablet, TAKE 1 TABLET BY MOUTH EVERY DAY, Disp: 90 tablet, Rfl: 0   Cetirizine HCl (ZYRTEC PO), Take 1 tablet by mouth daily., Disp: , Rfl:    Cholecalciferol (VITAMIN D3 PO), Take 2,000 Units by mouth daily., Disp: , Rfl:    cyclobenzaprine (FLEXERIL) 10 MG tablet, Take 0.5 tablets (5 mg total) by mouth 3 (three) times daily as needed for muscle spasms., Disp: 30 tablet, Rfl: 3   ESOMEPRAZOLE MAGNESIUM PO, Take 1 tablet by mouth daily., Disp: , Rfl:    fluticasone (FLONASE) 50 MCG/ACT nasal spray, PLACE 2 SPRAYS INTO THE NOSE DAILY AS NEEDED, Disp: 48 g, Rfl: 3   furosemide (LASIX) 20 MG tablet, Take 1  tablet (20 mg total) by mouth 2 (two) times a week. Take one extra tablet daily as needed for swelling, Disp: 90 tablet, Rfl: 1   loratadine (CLARITIN) 10 MG tablet, Take 10 mg by mouth as needed for allergies. Reported on 05/19/2015, Disp: , Rfl:    meclizine (ANTIVERT) 25 MG tablet, Take 1 tablet (25 mg total) by mouth as needed for dizziness., Disp: 30 tablet, Rfl: 1   metoprolol succinate (TOPROL-XL) 25 MG 24 hr tablet, TAKE 1 TABLET BY MOUTH EVERY DAY, Disp: 90 tablet, Rfl: 3   NON FORMULARY, PLEXUS: PRO BIO 5, Disp: , Rfl:    potassium chloride (KLOR-CON) 10 MEQ tablet, Take one tablet twice a week with lasix.  Take one extra tablet daily as needed with each extra lasix dose., Disp: 90 tablet, Rfl: 1   Probiotic Product (PROBIOTIC PO), Take 1 tablet by mouth daily., Disp: , Rfl:    simvastatin (ZOCOR) 10 MG tablet, Take 1 tablet (10 mg total) by mouth at bedtime., Disp: 90 tablet, Rfl: 3   valACYclovir (VALTREX) 1000 MG tablet, Take 2 tablets (2,000 mg total) by mouth 2 (two) times daily. For one day for cold sore, Disp: 12 tablet, Rfl: 1   Allergies  Allergen Reactions   Oxycodone-Acetaminophen Rash and Other (See Comments)   Paroxetine Hives   Paroxetine Hcl Hives   Sulfa Antibiotics Other (See Comments)  Amoxicillin-Pot Clavulanate     REACTION: GI   Amoxicillin-Pot Clavulanate Other (See Comments)    REACTION: GI   Calcium     REACTION: muscle aches   Erythromycin Other (See Comments)    REACTION: GI   Guaifenesin     REACTION: GI   Naproxen     REACTION: GI   Oxycodone-Acetaminophen     REACTION: reaction not known   Oxycodone-Acetaminophen Other (See Comments)    Other Reaction: Not Assessed   Oxycodone-Aspirin     REACTION: reaction not known   Oxycodone-Aspirin     REACTION: reaction not known    Sertraline Hcl     REACTION: hives   Sertraline Hcl    Sertraline Rash and Other (See Comments)    REACTION: hives    Past Medical History:  Diagnosis Date    Anxiety    Basal cell cancer    Basal cell carcinoma    face   Depression    DVT (deep venous thrombosis) (HCC) 1/08   Esophageal erosions    Factor V Leiden ([place])    On coag x 1 year ending in [place]   hospital- abd pain   GERD (gastroesophageal reflux disease)    Radial head fracture 2011     Past Surgical History:  Procedure Laterality Date   CHOLECYSTECTOMY     COLONOSCOPY     MOHS SURGERY  7/11   basal cell carcinoma near eye   OOPHORECTOMY     SHOULDER SURGERY  04/2006   TONSILLECTOMY  1976   UPPER GASTROINTESTINAL ENDOSCOPY      Family History  Problem Relation Age of Onset   Graves' disease Mother    Depression Mother    Osteoporosis Mother    Heart attack Father        x 3    Breast cancer Sister    Heart attack Brother        x 2   Diabetes Brother    Hypertension Brother    Hypertension Sister    Coronary artery disease Brother        with stents    Atrial fibrillation Brother    Colon cancer Neg Hx     Social History   Tobacco Use   Smoking status: Never   Smokeless tobacco: Never  Vaping Use   Vaping Use: Never used  Substance Use Topics   Alcohol use: Yes    Alcohol/week: 0.0 standard drinks    Comment: occ   Drug use: No    ROS   Objective:   Vitals: BP (!) 170/79 (BP Location: Left Arm)   Pulse 69   Temp 97.8 F (36.6 C) (Oral)   Resp 16   SpO2 98%   Physical Exam Constitutional:      General: She is not in acute distress.    Appearance: Normal appearance. She is well-developed. She is not ill-appearing, toxic-appearing or diaphoretic.  HENT:     Head: Normocephalic and atraumatic.     Nose: Nose normal.     Mouth/Throat:     Mouth: Mucous membranes are moist.     Pharynx: Oropharynx is clear.  Eyes:     General: No scleral icterus.       Right eye: No discharge.        Left eye: No discharge.     Extraocular Movements: Extraocular movements intact.     Conjunctiva/sclera: Conjunctivae normal.      Pupils: Pupils are  equal, round, and reactive to light.  Cardiovascular:     Rate and Rhythm: Normal rate.  Pulmonary:     Effort: Pulmonary effort is normal.  Musculoskeletal:       Arms:  Skin:    General: Skin is warm and dry.  Neurological:     General: No focal deficit present.     Mental Status: She is alert and oriented to person, place, and time.  Psychiatric:        Mood and Affect: Mood normal.        Behavior: Behavior normal.        Thought Content: Thought content normal.        Judgment: Judgment normal.    Assessment and Plan :   PDMP not reviewed this encounter.  1. Right arm pain   2. History of DVT (deep vein thrombosis)   3. Factor V Leiden ([place])   4. Superficial thrombophlebitis of right upper extremity     Patient is likely undergoing thrombophlebitis versus phlebitis.  There is a distinct possibility of cellulitis given physical exam findings.  However, given that she is not on anticoagulation, will pursue ultrasound to rule out DVT.  This was scheduled for tomorrow.  In the meantime, recommended supportive care including Tylenol, compression, icing.  If her ultrasound is completely negative, recommend that she start Keflex, provide her with a prescription for this. Counseled patient on potential for adverse effects with medications prescribed/recommended today, ER and return-to-clinic precautions discussed, patient verbalized understanding.    Jaynee Eagles, [place] 12/21/20 1713

## 2020-12-21 NOTE — Telephone Encounter (Signed)
PLEASE NOTE: All timestamps contained within this report are represented as [place] Standard Time. CONFIDENTIALTY NOTICE: This fax transmission is intended only for the addressee. It contains information that is legally privileged, confidential or otherwise protected from use or disclosure. If you are not the intended recipient, you are strictly prohibited from reviewing, disclosing, copying using or disseminating any of this information or taking any action in reliance on or regarding this information. If you have received this fax in error, please notify us immediately by telephone so that we can arrange for its return to [place]. Phone: [phone], Toll-Free: [phone], Fax: [phone] Page: 1 of 2 Call Id: [id number] [place] [facility] - Client TELEPHONE ADVICE RECORD [facility] Patient Name: Kristine [name] Call Gender: Female DOB: [birth year] Age: 65 Y 3 M 29 D Return Phone Number: [phone] (Primary) Address: City/ State/ Zip: [place] [address] Client [place] [facility] - Client Client Site [place] MD Contact Type Call Who Is Calling Patient / Member / Family / Caregiver Call Type Triage / Clinical Relationship To Patient Self Return Phone Number [phone] (Primary) Chief Complaint Wound Infection Reason for Call Symptomatic / Request for [place] states she had a MRI Sunday and she had IV contrast, now her arm is swollen, red, warm, and streaking. Translation No Nurse Assessment Nurse: D'Heur Lucia Gaskins, RN, [place] Date/Time (Eastern Time): 12/21/2020 3:11:33 PM Confirm and document reason for call. If symptomatic, describe symptoms. ---Caller states she had a MRI Sunday and she had IV contrast, Arm became tender on Monday during the day. At night she started to notice swelling, rising redness, and now her arm is swollen, red, warm,  and streaking. Warm compresses are not really helping. It is more tender today. Does the patient have any new or worsening symptoms? ---Yes Will a triage be completed? ---Yes Related visit to physician within the last 2 weeks? ---Yes Does the PT have any chronic conditions? (i.e. diabetes, asthma, this includes High risk factors for pregnancy, etc.) ---Yes List chronic conditions. ---High cholesterol, HTN, Leiden Factor 5, hx of DVT's Is this a behavioral health or substance abuse call? ---No Guidelines Guideline Title Affirmed Question Affirmed Notes Nurse Date/Time (Eastern Time) IV Site and Other Symptoms [1] Red streak at IV site AND [2] palpable cord [place], RN, [place] 12/21/2020 3:15:30 PM PLEASE NOTE: All timestamps contained within this report are represented as [place] Standard Time. CONFIDENTIALTY NOTICE: This fax transmission is intended only for the addressee. It contains information that is legally privileged, confidential or otherwise protected from use or disclosure. If you are not the intended recipient, you are strictly prohibited from reviewing, disclosing, copying using or disseminating any of this information or taking any action in reliance on or regarding this information. If you have received this fax in error, please notify us immediately by telephone so that we can arrange for its return to [place]. Phone: [phone], Toll-Free: [phone], Fax: [phone] Page: 2 of 2 Call Id: [id number] [place]. Time Eilene Ghazi Time) Disposition Final User 12/21/2020 2:55:42 PM Attempt made - message left D'Heur Lucia Gaskins, RN, Vincente Liberty 12/21/2020 3:17:46 PM See HCP within 4 Hours (or PCP triage) Yes D'Heur Lucia Gaskins, RN, Ebbie Latus Disagree/Comply Comply Caller Understands Yes PreDisposition Call Doctor Care Advice Given Per Guideline SEE HCP (OR PCP TRIAGE) WITHIN 4 HOURS: * IF OFFICE WILL BE OPEN: You need to be seen within the next 3 or 4 hours. Call your  doctor (or NP/PA) now or as soon as the office opens. PAIN MEDICINES: * For pain relief, you can take either acetaminophen, ibuprofen, or naproxen. CALL BACK IF: * You become worse CARE ADVICE given per IV Site and Other Symptoms (Adult) guideline. Comments User: Vincente Liberty, D'Heur Lucia Gaskins, RN Date/Time Eilene Ghazi Time): 12/21/2020 3:22:29 PM Attempted warm transfer, no available appointments until Tuesday. Advised UC. Referrals REFERRED TO PCP OFFICE

## 2020-12-21 NOTE — ED Triage Notes (Signed)
MRI done Sunday with contrast. Since then, the IV site on right forearm has red, tender to touch, warm, and swollen. Patient states it's progressively getting worse. Hx of DVT and factor 5 Leiden

## 2020-12-22 ENCOUNTER — Ambulatory Visit (HOSPITAL_BASED_OUTPATIENT_CLINIC_OR_DEPARTMENT_OTHER)
Admission: RE | Admit: 2020-12-22 | Discharge: 2020-12-22 | Disposition: A | Discharge status: Home / Self Care | Payer: Medicare Other | Source: Ambulatory Visit | Attending: Urgent Care | Admitting: Urgent Care

## 2020-12-22 ENCOUNTER — Ambulatory Visit (HOSPITAL_COMMUNITY)
Admission: RE | Admit: 2020-12-22 | Discharge: 2020-12-22 | Disposition: A | Discharge status: Home / Self Care | Payer: Medicare Other | Source: Ambulatory Visit | Attending: Urgent Care | Admitting: Urgent Care

## 2020-12-22 ENCOUNTER — Telehealth: Payer: Self-pay | Admitting: Family Medicine

## 2020-12-22 DIAGNOSIS — M79603 Pain in arm, unspecified: Secondary | ICD-10-CM | POA: Diagnosis present

## 2020-12-22 DIAGNOSIS — R509 Fever, unspecified: Secondary | ICD-10-CM | POA: Diagnosis not present

## 2020-12-22 DIAGNOSIS — M7989 Other specified soft tissue disorders: Secondary | ICD-10-CM

## 2020-12-22 DIAGNOSIS — D6851 Activated protein C resistance: Secondary | ICD-10-CM | POA: Diagnosis not present

## 2020-12-22 DIAGNOSIS — Z86718 Personal history of other venous thrombosis and embolism: Secondary | ICD-10-CM | POA: Insufficient documentation

## 2020-12-22 DIAGNOSIS — M79601 Pain in right arm: Secondary | ICD-10-CM | POA: Diagnosis not present

## 2020-12-22 NOTE — Telephone Encounter (Signed)
There is a superficial clot in her arm seen on ultrasound (thankfully not a deep one)  Gentle heat and tylenol is ok if tolerated  She takes asa 81 mg , if she tolerates it increase to 325 mg daily with food  Follow up with me next week for a re check please and contact us earlier if things worse or change

## 2020-12-22 NOTE — Telephone Encounter (Signed)
I cannot tell her if result will be ready today or not but I will send this back to myself as a reminder to check for it  Thanks for letting me know

## 2020-12-22 NOTE — Progress Notes (Signed)
RUE venous duplex has been completed.  Patient's PCP Dr. Glori Bickers made aware of preliminary results.  Results can be found under chart review under CV PROC. 12/22/2020 3:03 PM Chemika Nightengale RVT, RDMS

## 2020-12-22 NOTE — Telephone Encounter (Signed)
Pt went to UC and had a venous doppler done. Jody from vascular lab at [facility] called with a call report: They did find a superficial clot in the forearm of the basilic vein. Report will be ready soon.  Advised Dr Glori Bickers. She stated pt can go and she will address at the end of the day.

## 2020-12-22 NOTE — Telephone Encounter (Signed)
Kristine [name] called in and stated that she is having phelembitus from mri contrast via iv. She went to [facility] for the mri to check and see if she has a bloodclot and she was suppose to be there at 9 and now she is trying to get worked in at [employer] and wanted to know if she can get the results today after its done due to she wanted to make sure she doesn't have a blood clot in her arm.

## 2020-12-22 NOTE — Telephone Encounter (Signed)
Pt notified of Dr. Marliss Coots comments. Pt will increase asa and f/u appt scheduled with PCP

## 2020-12-26 ENCOUNTER — Ambulatory Visit (INDEPENDENT_AMBULATORY_CARE_PROVIDER_SITE_OTHER): Payer: Medicare Other | Admitting: Family Medicine

## 2020-12-26 ENCOUNTER — Other Ambulatory Visit: Payer: Self-pay

## 2020-12-26 ENCOUNTER — Encounter: Payer: Self-pay | Admitting: Family Medicine

## 2020-12-26 DIAGNOSIS — I82601 Acute embolism and thrombosis of unspecified veins of right upper extremity: Secondary | ICD-10-CM | POA: Diagnosis not present

## 2020-12-26 DIAGNOSIS — I10 Essential (primary) hypertension: Secondary | ICD-10-CM | POA: Diagnosis not present

## 2020-12-26 NOTE — Progress Notes (Signed)
Subjective:    Patient ID: Kristine [name], female    DOB: 10/04/1955, 65 y.o.   MRN: [id number]  This visit occurred during the SARS-CoV-2 public health emergency.  Safety protocols were in place, including screening questions prior to the visit, additional usage of staff PPE, and extensive cleaning of exam room while observing appropriate contact time as indicated for disinfecting solutions.   HPI Pt presents for f/u of superficial clot in arm  Wt Readings from Last 3 Encounters:  10/24/20 201 lb 11.2 oz (91.5 kg)  03/13/20 198 lb 12.8 oz (90.2 kg)  06/08/19 201 lb 4 oz (91.3 kg)   33.82 kg/m  This occurred after IV for MRI (did not infiltrate) -was a tough stick  Developed symptoms on Tuesday  She was seen in [place] on 8/11 for worsening pain/redness and inflammation of R arm  Doppler was ordered   UE doppler done on 8/12 Reading: Summary:     Right:  No evidence of deep vein thrombosis in the upper extremity. Findings  consistent  with acute superficial vein thrombosis involving the right basilic vein.     Left:  No evidence of thrombosis in the subclavian.      *See table(s) above for measurements and observations.   Pt has h/o factor 5 Leiden mutation   Now swelling and redness are improved Doing better  More comfortable   Went ahead and filled the keflex to treat cellulitis  Took full dose asa for a week   BP Readings from Last 3 Encounters:  12/26/20 (!) 142/76  12/21/20 (!) 170/79  10/24/20 (!) 143/72   Improved blood pressure today   Patient Active Problem List   Diagnosis Date Noted   Arm vein blood clot, right 12/26/2020   Estrogen deficiency 10/24/2020   History of pulmonary hypertension 06/08/2019   History of shingles 01/21/2018   Prediabetes 04/21/2015   Stress reaction 04/21/2015   Essential hypertension 04/21/2015   Migraine with aura 06/21/2013   Obesity 06/16/2011   Rhinitis 06/14/2011   Routine general medical examination at a  health care facility 06/14/2011   SCIATICA 05/22/2009   GASTROESOPHAGEAL REFLUX DISEASE 04/14/2009   Factor 5 Leiden mutation, heterozygous ([place]) 12/07/2007   Depression with anxiety 11/25/2006   Esophagitis 11/25/2006   IBS 11/25/2006   DVT, HX OF 11/25/2006   MIGRAINES, HX OF 11/25/2006   HYPERCHOLESTEROLEMIA, PURE 10/29/2006   Past Medical History:  Diagnosis Date   Anxiety    Basal cell cancer    Basal cell carcinoma    face   Depression    DVT (deep venous thrombosis) ([place]) 1/08   Esophageal erosions    Factor V Leiden ([place])    On coag x 1 year ending in 09   Gastritis 1990   hospital- abd pain   GERD (gastroesophageal reflux disease)    Radial head fracture 2011   Past Surgical History:  Procedure Laterality Date   CHOLECYSTECTOMY     COLONOSCOPY     MOHS SURGERY  7/11   basal cell carcinoma near eye   OOPHORECTOMY     SHOULDER SURGERY  04/2006   TONSILLECTOMY  1976   UPPER GASTROINTESTINAL ENDOSCOPY     Social History   Tobacco Use   Smoking status: Never   Smokeless tobacco: Never  Vaping Use   Vaping Use: Never used  Substance Use Topics   Alcohol use: Yes    Alcohol/week: 0.0 standard drinks    Comment: occ   Drug  use: No   Family History  Problem Relation Age of Onset   Graves' disease Mother    Depression Mother    Osteoporosis Mother    Heart attack Father        x 3    Breast cancer Sister    Heart attack Brother        x 2   Diabetes Brother    Hypertension Brother    Hypertension Sister    Coronary artery disease Brother        with stents    Atrial fibrillation Brother    Colon cancer Neg Hx    Allergies  Allergen Reactions   Oxycodone-Acetaminophen Rash and Other (See Comments)   Paroxetine Hives   Paroxetine Hcl Hives   Sulfa Antibiotics Other (See Comments)   Amoxicillin-Pot Clavulanate     REACTION: GI   Amoxicillin-Pot Clavulanate Other (See Comments)    REACTION: GI   Calcium     REACTION: muscle aches    Erythromycin Other (See Comments)    REACTION: GI   Guaifenesin     REACTION: GI   Naproxen     REACTION: GI   Oxycodone-Acetaminophen     REACTION: reaction not known   Oxycodone-Acetaminophen Other (See Comments)    Other Reaction: Not Assessed   Oxycodone-Aspirin     REACTION: reaction not known   Oxycodone-Aspirin     REACTION: reaction not known    Sertraline Hcl     REACTION: hives   Sertraline Hcl    Sertraline Rash and Other (See Comments)    REACTION: hives   Current Outpatient Medications on File Prior to Visit  Medication Sig Dispense Refill   acetaminophen (TYLENOL) 500 MG tablet Take 500 mg by mouth every 6 (six) hours as needed.     acyclovir ointment (ZOVIRAX) 5 % Apply 1 application topically every 3 (three) hours. To cold sore as needed 5 g 3   albuterol (PROVENTIL HFA;VENTOLIN HFA) 108 (90 Base) MCG/ACT inhaler INHALE 1-2 PUFFS INTO THE LUNGS EVERY 6 (SIX) HOURS AS NEEDED FOR WHEEZING OR SHORTNESS OF BREATH. 8.5 Inhaler 3   ALPRAZolam (XANAX) 0.25 MG tablet Take 1 tablet (0.25 mg total) by mouth at bedtime as needed for anxiety. 30 tablet 0   aspirin 325 MG tablet Take 325 mg by mouth daily.     buPROPion (WELLBUTRIN XL) 150 MG 24 hr tablet TAKE 1 TABLET BY MOUTH EVERY DAY 90 tablet 0   cephALEXin (KEFLEX) 500 MG capsule Take 1 capsule (500 mg total) by mouth 3 (three) times daily. 30 capsule 0   Cetirizine HCl (ZYRTEC PO) Take 1 tablet by mouth daily.     Cholecalciferol (VITAMIN D3 PO) Take 2,000 Units by mouth daily.     cyclobenzaprine (FLEXERIL) 10 MG tablet Take 0.5 tablets (5 mg total) by mouth 3 (three) times daily as needed for muscle spasms. 30 tablet 3   ESOMEPRAZOLE MAGNESIUM PO Take 1 tablet by mouth daily.     fluticasone (FLONASE) 50 MCG/ACT nasal spray PLACE 2 SPRAYS INTO THE NOSE DAILY AS NEEDED 48 g 3   furosemide (LASIX) 20 MG tablet Take 1 tablet (20 mg total) by mouth 2 (two) times a week. Take one extra tablet daily as needed for swelling 90  tablet 1   loratadine (CLARITIN) 10 MG tablet Take 10 mg by mouth as needed for allergies. Reported on 05/19/2015     meclizine (ANTIVERT) 25 MG tablet Take 1 tablet (25 mg total) by  mouth as needed for dizziness. 30 tablet 1   metoprolol succinate (TOPROL-XL) 25 MG 24 hr tablet TAKE 1 TABLET BY MOUTH EVERY DAY 90 tablet 3   NON FORMULARY PLEXUS: PRO BIO 5     potassium chloride (KLOR-CON) 10 MEQ tablet Take one tablet twice a week with lasix.  Take one extra tablet daily as needed with each extra lasix dose. 90 tablet 1   Probiotic Product (PROBIOTIC PO) Take 1 tablet by mouth daily.     simvastatin (ZOCOR) 10 MG tablet Take 1 tablet (10 mg total) by mouth at bedtime. 90 tablet 3   valACYclovir (VALTREX) 1000 MG tablet Take 2 tablets (2,000 mg total) by mouth 2 (two) times daily. For one day for cold sore 12 tablet 1   No current facility-administered medications on file prior to visit.    Review of Systems  Constitutional:  Negative for activity change, appetite change, fatigue, fever and unexpected weight change.  HENT:  Negative for congestion, ear pain, rhinorrhea, sinus pressure and sore throat.   Eyes:  Negative for pain, redness and visual disturbance.  Respiratory:  Negative for cough, shortness of breath and wheezing.   Cardiovascular:  Negative for chest pain and palpitations.  Gastrointestinal:  Negative for abdominal pain, blood in stool, constipation and diarrhea.  Endocrine: Negative for polydipsia and polyuria.  Genitourinary:  Negative for dysuria, frequency and urgency.  Musculoskeletal:  Negative for arthralgias, back pain and myalgias.       Right arm soreness/swelling   Skin:  Positive for color change. Negative for pallor and rash.  Allergic/Immunologic: Negative for environmental allergies.  Neurological:  Negative for dizziness, syncope and headaches.  Hematological:  Negative for adenopathy. Does not bruise/bleed easily.  Psychiatric/Behavioral:  Negative for  decreased concentration and dysphoric mood. The patient is not nervous/anxious.       Objective:   Physical Exam Constitutional:      General: She is not in acute distress.    Appearance: Normal appearance. She is obese. She is not ill-appearing or diaphoretic.  Eyes:     Conjunctiva/sclera: Conjunctivae normal.     Pupils: Pupils are equal, round, and reactive to light.  Cardiovascular:     Rate and Rhythm: Normal rate and regular rhythm.     Pulses: Normal pulses.     Heart sounds: Normal heart sounds.  Pulmonary:     Effort: Pulmonary effort is normal. No respiratory distress.     Breath sounds: Normal breath sounds. No wheezing.  Musculoskeletal:     Cervical back: Neck supple. No tenderness.     Comments: Mild swelling with streak of erythema on R forearm Palpable cord noted superficially  Minimally tender  Well perfused Pt pt much improved   Lymphadenopathy:     Cervical: No cervical adenopathy.  Neurological:     Mental Status: She is alert.     Sensory: No sensory deficit.     Motor: No weakness.  Psychiatric:        Mood and Affect: Mood normal.          Assessment & Plan:   Problem List Items Addressed This Visit       Cardiovascular and Mediastinum   Essential hypertension    bp was high in uc/now improving  BP: (!) 142/76    Continue to monitor  Continue lmetoprolol xl 25 mg daily      Relevant Medications   aspirin 325 MG tablet   Arm vein blood clot, right  Arm redness/swelling (possibly cellulitis) following IV for MRI Seen at [facility] on 8/11 (notes and results reviewed)  UE US showed superficial basilic vein clot  Adv to inc asa to 325 mg for 1-2 wk (pt has h/o factor 5 leiden) and keflex much improved  Reassuring exam  Adv to finish abx and continue warm compresses/elevation  If no further imp or if worse consider re imaging        Relevant Medications   aspirin 325 MG tablet

## 2020-12-26 NOTE — Assessment & Plan Note (Signed)
Arm redness/swelling (possibly cellulitis) following IV for MRI Seen at [facility] on 8/11 (notes and results reviewed)  UE US showed superficial basilic vein clot  Adv to inc asa to 325 mg for 1-2 wk (pt has h/o factor 5 leiden) and keflex much improved  Reassuring exam  Adv to finish abx and continue warm compresses/elevation  If no further imp or if worse consider re imaging

## 2020-12-26 NOTE — Assessment & Plan Note (Addendum)
bp was high in uc/now improving  BP: (!) 142/76    Continue to monitor  Continue lmetoprolol xl 25 mg daily

## 2020-12-26 NOTE — Patient Instructions (Signed)
I'm glad the arm is improving Continue warm compresses Elevation if helpful  Finish the keflex   Continue the full dose aspirin for 1-2 weeks  If symptoms worsen again -please let us know (we would consider more imaging)

## 2021-02-02 ENCOUNTER — Telehealth: Payer: Self-pay | Admitting: Internal Medicine

## 2021-02-02 MED ORDER — METOPROLOL SUCCINATE ER 25 MG PO TB24: 25.0000 mg | ORAL_TABLET | Freq: Every day | ORAL | 3 refills | Status: DC

## 2021-02-02 NOTE — Telephone Encounter (Signed)
Pt's medication was sent to local pharmacy for a 10 day supply for pt is out of town. Confirmation received.

## 2021-02-02 NOTE — Telephone Encounter (Signed)
*  STAT* If patient is at the pharmacy, call can be transferred to refill team.   1. Which medications need to be refilled? (please list name of each medication and dose if known)  metoprolol succinate (TOPROL-XL) 25 MG 24 hr tablet  2. Which pharmacy/location (including street and city if local pharmacy) is medication to be sent to? [id number] - [place], [place] - [address] AT [address]  3. Do they need a 30 day or 90 day supply?   Rep with [facility] states the patient is out of town and she forgot her medication at home. He would like to know if an emergency 10-day supply can be sent to their location. Please advise.

## 2021-03-26 ENCOUNTER — Other Ambulatory Visit: Payer: Self-pay | Admitting: Family Medicine

## 2021-04-07 ENCOUNTER — Other Ambulatory Visit: Payer: Self-pay | Admitting: Internal Medicine

## 2021-05-03 ENCOUNTER — Other Ambulatory Visit: Payer: Self-pay | Admitting: Internal Medicine

## 2021-05-07 ENCOUNTER — Other Ambulatory Visit: Payer: Self-pay | Admitting: Internal Medicine

## 2021-06-01 ENCOUNTER — Other Ambulatory Visit: Payer: Self-pay | Admitting: Internal Medicine

## 2021-06-06 ENCOUNTER — Encounter: Payer: Self-pay | Admitting: Internal Medicine

## 2021-06-06 DIAGNOSIS — E7800 Pure hypercholesterolemia, unspecified: Secondary | ICD-10-CM

## 2021-06-06 DIAGNOSIS — I272 Pulmonary hypertension, unspecified: Secondary | ICD-10-CM

## 2021-06-06 DIAGNOSIS — R7309 Other abnormal glucose: Secondary | ICD-10-CM

## 2021-06-06 DIAGNOSIS — Z79899 Other long term (current) drug therapy: Secondary | ICD-10-CM

## 2021-06-07 ENCOUNTER — Other Ambulatory Visit: Payer: Self-pay

## 2021-06-07 DIAGNOSIS — E7800 Pure hypercholesterolemia, unspecified: Secondary | ICD-10-CM

## 2021-06-07 DIAGNOSIS — I272 Pulmonary hypertension, unspecified: Secondary | ICD-10-CM

## 2021-06-07 DIAGNOSIS — Z79899 Other long term (current) drug therapy: Secondary | ICD-10-CM

## 2021-06-07 DIAGNOSIS — R7309 Other abnormal glucose: Secondary | ICD-10-CM

## 2021-06-16 ENCOUNTER — Encounter: Payer: Self-pay | Admitting: Family Medicine

## 2021-06-16 DIAGNOSIS — E7800 Pure hypercholesterolemia, unspecified: Secondary | ICD-10-CM

## 2021-07-12 ENCOUNTER — Encounter: Payer: Self-pay | Admitting: Internal Medicine

## 2021-07-12 MED ORDER — METOPROLOL SUCCINATE ER 25 MG PO TB24: 25.0000 mg | ORAL_TABLET | Freq: Every day | ORAL | 0 refills | Status: DC

## 2021-07-13 ENCOUNTER — Other Ambulatory Visit: Payer: Self-pay

## 2021-07-13 MED ORDER — METOPROLOL SUCCINATE ER 25 MG PO TB24: 25.0000 mg | ORAL_TABLET | Freq: Every day | ORAL | 0 refills | Status: DC

## 2021-07-17 LAB — HM MAMMOGRAPHY

## 2021-07-24 LAB — HM DEXA SCAN: HM Dexa Scan: NORMAL

## 2021-07-27 ENCOUNTER — Other Ambulatory Visit (INDEPENDENT_AMBULATORY_CARE_PROVIDER_SITE_OTHER): Payer: Medicare Other

## 2021-07-27 ENCOUNTER — Other Ambulatory Visit: Payer: Self-pay

## 2021-07-27 DIAGNOSIS — E7800 Pure hypercholesterolemia, unspecified: Secondary | ICD-10-CM | POA: Diagnosis not present

## 2021-07-27 LAB — LIPID PANEL
Cholesterol: 161 mg/dL (ref 0–200)
HDL: 69.3 mg/dL (ref >39.00)
LDL Cholesterol: 78 mg/dL (ref 0–99)
NonHDL: 91.55
Total CHOL/HDL Ratio: 2
Triglycerides: 69 mg/dL (ref 0.0–149.0)
VLDL: 13.8 mg/dL (ref 0.0–40.0)

## 2021-07-30 ENCOUNTER — Encounter: Payer: Self-pay | Admitting: Internal Medicine

## 2021-07-30 NOTE — Progress Notes (Signed)
? ?Cardiology Office Note ? ? ?Date:  07/31/2021  ? ?ID:  Kristine [name], DOB [birth year], MRN [id number] ? ?PCP:  Abner Greenspan, MD  ?Cardiologist:   Dorris Carnes, MD  ?Patient presents for f/u of paliptations and lpipds     ?  ?History of Present Illness: ?Kristine [name] is a 66 y.o. female with a history of palpitation ?Echo normal in 2017  Myovue normal  She was treated with Toprol  ? She went on to have an echo done 2019  LVEF and RVEF were normal RV and RA normal in sze  PAP 47   IVC dilated ?Repeat echo IVC normalized and PAP improved    She watched salt ? ?I saw the pt in 2021  Calcium score CT   Score:   0    Mild plaqung  at the aortic root   ? ?Since seen the pt says she is doing great   Rare palpiations when forgets meds    BP is better at home     ?Active  No CP   Breaghing is OK    ? ?Breakfast:   one egg     1/2 english muffin   Coffee/cream ?Lunch  1/2 [place] sandwich and water    White/wheat    ?DinnerTurkey on grill Squash   Zucchini   Butter beans     Water ?Snacks   Yogurt ? ?Dinner 7 - 8:30 PM    Breakfast 7 to 8:30 ? ?Outpatient Medications Prior to Visit  ?Medication Sig Dispense Refill  ? acetaminophen (TYLENOL) 500 MG tablet Take 500 mg by mouth every 6 (six) hours as needed.    ? acyclovir ointment (ZOVIRAX) 5 % Apply 1 application topically every 3 (three) hours. To cold sore as needed 5 g 3  ? albuterol (PROVENTIL HFA;VENTOLIN HFA) 108 (90 Base) MCG/ACT inhaler INHALE 1-2 PUFFS INTO THE LUNGS EVERY 6 (SIX) HOURS AS NEEDED FOR WHEEZING OR SHORTNESS OF BREATH. 8.5 Inhaler 3  ? ALPRAZolam (XANAX) 0.25 MG tablet Take 1 tablet (0.25 mg total) by mouth at bedtime as needed for anxiety. 30 tablet 0  ? aspirin 325 MG tablet Take 325 mg by mouth daily.    ? buPROPion (WELLBUTRIN XL) 150 MG 24 hr tablet TAKE 1 TABLET EVERY DAY 90 tablet 1  ? Cetirizine HCl (ZYRTEC PO) Take 1 tablet by mouth daily.    ? Cholecalciferol (VITAMIN D3 PO) Take 5,000 Units by mouth daily.    ? ciclopirox  (PENLAC) 8 % solution Apply topically daily.    ? cyclobenzaprine (FLEXERIL) 10 MG tablet Take 0.5 tablets (5 mg total) by mouth 3 (three) times daily as needed for muscle spasms. 30 tablet 3  ? ESOMEPRAZOLE MAGNESIUM PO Take 1 tablet by mouth daily.    ? fluticasone (FLONASE) 50 MCG/ACT nasal spray PLACE 2 SPRAYS INTO THE NOSE DAILY AS NEEDED 48 g 3  ? furosemide (LASIX) 20 MG tablet Take 1 tablet (20 mg total) by mouth 2 (two) times a week. Pt needs to make an appt. In order to receive further refills.Thank You. 1st Attempt 36 tablet 0  ? loratadine (CLARITIN) 10 MG tablet Take 10 mg by mouth as needed for allergies. Reported on 05/19/2015    ? meclizine (ANTIVERT) 25 MG tablet Take 1 tablet (25 mg total) by mouth as needed for dizziness. 30 tablet 1  ? metoprolol succinate (TOPROL-XL) 25 MG 24 hr tablet Take 1 tablet (25 mg total) by mouth daily. 90 tablet  0  ? NON FORMULARY PLEXUS: PRO BIO 5    ? potassium chloride (KLOR-CON) 10 MEQ tablet Take 1 tablet (10 mEq total) by mouth daily. Pt. make appt. with Cardiologist in order to receive further refills. Thank You. 1st Attempt. 30 tablet 0  ? Probiotic Product (PROBIOTIC PO) Take 1 tablet by mouth daily.    ? simvastatin (ZOCOR) 10 MG tablet Take 1 tablet (10 mg total) by mouth at bedtime. 90 tablet 3  ? valACYclovir (VALTREX) 1000 MG tablet Take 2 tablets (2,000 mg total) by mouth 2 (two) times daily. For one day for cold sore (Patient taking differently: Take 2,000 mg by mouth as needed. For one day for cold sore) 12 tablet 1  ? cephALEXin (KEFLEX) 500 MG capsule Take 1 capsule (500 mg total) by mouth 3 (three) times daily. 30 capsule 0  ? ?No facility-administered medications prior to visit.  ? ? ? ?Allergies:   Oxycodone-acetaminophen, Paroxetine, Paroxetine hcl, Sulfa antibiotics, Amoxicillin-pot clavulanate, Amoxicillin-pot clavulanate, Calcium, Erythromycin, Guaifenesin, Naproxen, Oxycodone-acetaminophen, Oxycodone-acetaminophen, Oxycodone-aspirin,  Oxycodone-aspirin, Sertraline hcl, Sertraline hcl, and Sertraline  ? ?Past Medical History:  ?Diagnosis Date  ? Anxiety   ? Basal cell cancer   ? Basal cell carcinoma   ? face  ? Depression   ? DVT (deep venous thrombosis) ([place]) 1/08  ? Esophageal erosions   ? Factor V Leiden ([place])   ? On coag x 1 year ending in 09  ? Gastritis 1990  ? hospital- abd pain  ? GERD (gastroesophageal reflux disease)   ? Radial head fracture 2011  ? ? ?Past Surgical History:  ?Procedure Laterality Date  ? CHOLECYSTECTOMY    ? COLONOSCOPY    ? MOHS SURGERY  7/11  ? basal cell carcinoma near eye  ? OOPHORECTOMY    ? SHOULDER SURGERY  04/2006  ? TONSILLECTOMY  1976  ? UPPER GASTROINTESTINAL ENDOSCOPY    ? ? ? ?Social History:  The patient  reports that she has never smoked. She has never used smokeless tobacco. She reports current alcohol use. She reports that she does not use drugs.  ? ?Family History:  The patient's family history includes Atrial fibrillation in her brother; Breast cancer in her sister; Coronary artery disease in her brother; Depression in her mother; Diabetes in her brother; Berenice Primas' disease in her mother; Heart attack in her brother and father; Hypertension in her brother and sister; Osteoporosis in her mother.  ? ? ?ROS:  Please see the history of present illness. All other systems are reviewed and  Negative to the above problem except as noted.  ? ? ?PHYSICAL EXAM: ?VS:  BP 139/89   Pulse 64   Ht '5\' 6"'$  (1.676 m)   Wt 202 lb (91.6 kg)   SpO2 99%   BMI 32.60 kg/m?   ?GEN: Obese 66 yo , in no acute distress  ?HEENT: normal  ?Neck: no JVD, carotid bruits ?Cardiac: RRR; no murmurs, rubs, or gallops,  No  LE edema  ?Respiratory:  clear to auscultation bilaterally ?GI: soft, nontender, nondistended, + BS  ?MS: no deformity Moving all extremities   ?Skin: warm and dry, no rash ? ? ?EKG:  EKG is  ordered today  SR 64 bpm  ? ?Ca score CT  (04/04/20)   ?Ca score 0   ?Scattered calcifications of aortic root   ? ?Echo   03/2020 ? ?1. Left ventricular ejection fraction by 3D volume is 67 %. The left ventricle has normal function. ?The left ventricle has  no regional wall motion abnormalities. Left ventricular diastolic ?parameters were normal. The average left ventricular global longitudinal strain is -24.3 %. ?The global longitudinal strain is normal. ?2. Right ventricular systolic function is normal. The right ventricular size is normal. There is ?normal pulmonary artery systolic pressure. The estimated right ventricular systolic pressure is ?94.4 mmHg. ?3. Left atrial size was borderline dilated. ?4. The mitral valve is normal in structure. Trivial mitral valve regurgitation. No evidence of ?mitral stenosis. ?5. The aortic valve is tricuspid. Aortic valve regurgitation is not visualized. No aortic stenosis is ?present. ?6. The inferior vena cava is normal in size with greater than 50% respiratory variability, ?suggesting right atrial pressure of 3 mmHg. ?Lipid Panel ?   ?Component Value Date/Time  ? CHOL 161 07/27/2021 0847  ? TRIG 69.0 07/27/2021 0847  ? HDL 69.30 07/27/2021 0847  ? CHOLHDL 2 07/27/2021 0847  ? VLDL 13.8 07/27/2021 0847  ? [place] 78 07/27/2021 0847  ? LDLDIRECT 146.1 08/19/2006 0935  ? ?  ? ?Wt Readings from Last 3 Encounters:  ?07/31/21 202 lb (91.6 kg)  ?10/24/20 201 lb 11.2 oz (91.5 kg)  ?03/13/20 198 lb 12.8 oz (90.2 kg)  ?  ? ? ?ASSESSMENT AND PLAN: ? ?1   Hx of abnormal echo /Pulmonary HTN   Initial echo estimated. PAP at 47 mm Hg   IVC was dilated  RV was normal  ?Repeat echo estimated RV pressure 47 mm   IVC normal ?Limited echo in Nov 2021, PAP estimate was normal  RV normal  IVC normal    ?No plans for repeat for now   Follow clnicialy   ? ?2   Hx dyslipidemia  Pt is currently on simvistatin   Keep on low dose statin ? ?3  HLD    LDL 78  HDL 69   Trig 69    ? ?4  Metabolic   H6P is 6.2  in 2022  Should have drawn soon    Discussed diet   LImit carbs  ? ?5   Hx of palpitations   Pt denies ? ? ?Current  medicines are reviewed at length with the patient today.  The patient does not have concerns regarding medicines. ? ?Signed, ?Dorris Carnes, MD  ?07/31/2021 3:02 PM    ?[place] ?[place]

## 2021-07-31 ENCOUNTER — Encounter: Payer: Self-pay | Admitting: Internal Medicine

## 2021-07-31 ENCOUNTER — Ambulatory Visit (INDEPENDENT_AMBULATORY_CARE_PROVIDER_SITE_OTHER): Payer: Medicare Other | Admitting: Internal Medicine

## 2021-07-31 ENCOUNTER — Other Ambulatory Visit: Payer: Self-pay

## 2021-07-31 ENCOUNTER — Other Ambulatory Visit: Payer: Self-pay | Admitting: Internal Medicine

## 2021-07-31 VITALS — BP 139/89 | HR 64 | Ht 66.0 in | Wt 202.0 lb

## 2021-07-31 DIAGNOSIS — Z79899 Other long term (current) drug therapy: Secondary | ICD-10-CM | POA: Diagnosis not present

## 2021-07-31 DIAGNOSIS — I272 Pulmonary hypertension, unspecified: Secondary | ICD-10-CM

## 2021-07-31 MED ORDER — POTASSIUM CHLORIDE ER 10 MEQ PO TBCR: 10.0000 meq | EXTENDED_RELEASE_TABLET | Freq: Every day | ORAL | 3 refills | Status: DC

## 2021-07-31 MED ORDER — SIMVASTATIN 10 MG PO TABS: 10.0000 mg | ORAL_TABLET | Freq: Every day | ORAL | 3 refills | Status: DC

## 2021-07-31 MED ORDER — FUROSEMIDE 20 MG PO TABS: 20.0000 mg | ORAL_TABLET | ORAL | 3 refills | Status: DC

## 2021-07-31 MED ORDER — METOPROLOL SUCCINATE ER 25 MG PO TB24: 25.0000 mg | ORAL_TABLET | Freq: Every day | ORAL | 3 refills | Status: DC

## 2021-07-31 NOTE — Patient Instructions (Signed)
Medication Instructions:  ?Your physician recommends that you continue on your current medications as directed. Please refer to the Current Medication list given to you today. ? ?*If you need a refill on your cardiac medications before your next appointment, please call your pharmacy* ? ? ?Lab Work: ?none ?If you have labs (blood work) drawn today and your tests are completely normal, you will receive your results only by: ?MyChart Message (if you have MyChart) OR ?A paper copy in the mail ?If you have any lab test that is abnormal or we need to change your treatment, we will call you to review the results. ? ? ?Testing/Procedures: ?none ? ? ?Follow-Up: ?At [facility], you and your health needs are our priority.  As part of our continuing mission to provide you with exceptional heart care, we have created designated Provider Care Teams.  These Care Teams include your primary Cardiologist (physician) and Advanced Practice Providers (APPs -  Physician Assistants and Nurse Practitioners) who all work together to provide you with the care you need, when you need it. ? ?We recommend signing up for the patient portal called "MyChart".  Sign up information is provided on this After Visit Summary.  MyChart is used to connect with patients for Virtual Visits (Telemedicine).  Patients are able to view lab/test results, encounter notes, upcoming appointments, etc.  Non-urgent messages can be sent to your provider as well.   ?To learn more about what you can do with MyChart, go to [email or web address].   ? ?Your next appointment:   ?12-15 month(s) ? ?The format for your next appointment:   ?In Person ? ?Provider:   ?Dorris Carnes, MD   ? ? ?Other Instructions ?  ?

## 2021-08-02 ENCOUNTER — Other Ambulatory Visit: Payer: Self-pay | Admitting: Obstetrics and Gynecology

## 2021-08-02 DIAGNOSIS — Z803 Family history of malignant neoplasm of breast: Secondary | ICD-10-CM

## 2021-10-18 ENCOUNTER — Ambulatory Visit (INDEPENDENT_AMBULATORY_CARE_PROVIDER_SITE_OTHER): Payer: Medicare Other

## 2021-10-18 VITALS — Wt 202.0 lb

## 2021-10-18 DIAGNOSIS — Z0000 Encounter for general adult medical examination without abnormal findings: Secondary | ICD-10-CM | POA: Diagnosis not present

## 2021-10-18 NOTE — Patient Instructions (Signed)
Kristine [name] , Thank you for taking time to come for your Medicare Wellness Visit. I appreciate your ongoing commitment to your health goals. Please review the following plan we discussed and let me know if I can assist you in the future.   Screening recommendations/referrals: Colonoscopy: Cologuard 01/13/20 Mammogram: March 2023 Bone Density: March 2023 Recommended yearly ophthalmology/optometry visit for glaucoma screening and checkup Recommended yearly dental visit for hygiene and checkup  Vaccinations: Influenza vaccine: n/d Pneumococcal vaccine: 10/24/20 Tdap vaccine: 04/03/18 Shingles vaccine: Zostavax 03/07/16 Covid-19:07/10/19, 07/30/19, 02/23/20  Advanced directives: no  Conditions/risks identified: none  Next appointment: Follow up in one year for your annual wellness visit 10/21/22 @ 2:45pm by phone   Preventive Care 65 Years and Older, Female Preventive care refers to lifestyle choices and visits with your health care provider that can promote health and wellness. What does preventive care include? A yearly physical exam. This is also called an annual well check. Dental exams once or twice a year. Routine eye exams. Ask your health care provider how often you should have your eyes checked. Personal lifestyle choices, including: Daily care of your teeth and gums. Regular physical activity. Eating a healthy diet. Avoiding tobacco and drug use. Limiting alcohol use. Practicing safe sex. Taking low-dose aspirin every day. Taking vitamin and mineral supplements as recommended by your health care provider. What happens during an annual well check? The services and screenings done by your health care provider during your annual well check will depend on your age, overall health, lifestyle risk factors, and family history of disease. Counseling  Your health care provider may ask you questions about your: Alcohol use. Tobacco use. Drug use. Emotional well-being. Home and  relationship well-being. Sexual activity. Eating habits. History of falls. Memory and ability to understand (cognition). Work and work Statistician. Reproductive health. Screening  You may have the following tests or measurements: Height, weight, and BMI. Blood pressure. Lipid and cholesterol levels. These may be checked every 5 years, or more frequently if you are over 39 years old. Skin check. Lung cancer screening. You may have this screening every year starting at age 32 if you have a 30-pack-year history of smoking and currently smoke or have quit within the past 15 years. Fecal occult blood test (FOBT) of the stool. You may have this test every year starting at age 48. Flexible sigmoidoscopy or colonoscopy. You may have a sigmoidoscopy every 5 years or a colonoscopy every 10 years starting at age 48. Hepatitis C blood test. Hepatitis B blood test. Sexually transmitted disease (STD) testing. Diabetes screening. This is done by checking your blood sugar (glucose) after you have not eaten for a while (fasting). You may have this done every 1-3 years. Bone density scan. This is done to screen for osteoporosis. You may have this done starting at age 88. Mammogram. This may be done every 1-2 years. Talk to your health care provider about how often you should have regular mammograms. Talk with your health care provider about your test results, treatment options, and if necessary, the need for more tests. Vaccines  Your health care provider may recommend certain vaccines, such as: Influenza vaccine. This is recommended every year. Tetanus, diphtheria, and acellular pertussis (Tdap, Td) vaccine. You may need a Td booster every 10 years. Zoster vaccine. You may need this after age 56. Pneumococcal 13-valent conjugate (PCV13) vaccine. One dose is recommended after age 85. Pneumococcal polysaccharide (PPSV23) vaccine. One dose is recommended after age 92. Talk to your health  care provider  about which screenings and vaccines you need and how often you need them. This information is not intended to replace advice given to you by your health care provider. Make sure you discuss any questions you have with your health care provider. Document Released: 05/26/2015 Document Revised: 01/17/2016 Document Reviewed: 02/28/2015 Elsevier Interactive Patient Education  2017 [place] Prevention in the Home Falls can cause injuries. They can happen to people of all ages. There are many things you can do to make your home safe and to help prevent falls. What can I do on the outside of my home? Regularly fix the edges of walkways and driveways and fix any cracks. Remove anything that might make you trip as you walk through a door, such as a raised step or threshold. Trim any bushes or trees on the path to your home. Use bright outdoor lighting. Clear any walking paths of anything that might make someone trip, such as rocks or tools. Regularly check to see if handrails are loose or broken. Make sure that both sides of any steps have handrails. Any raised decks and porches should have guardrails on the edges. Have any leaves, snow, or ice cleared regularly. Use sand or salt on walking paths during winter. Clean up any spills in your garage right away. This includes oil or grease spills. What can I do in the bathroom? Use night lights. Install grab bars by the toilet and in the tub and shower. Do not use towel bars as grab bars. Use non-skid mats or decals in the tub or shower. If you need to sit down in the shower, use a plastic, non-slip stool. Keep the floor dry. Clean up any water that spills on the floor as soon as it happens. Remove soap buildup in the tub or shower regularly. Attach bath mats securely with double-sided non-slip rug tape. Do not have throw rugs and other things on the floor that can make you trip. What can I do in the bedroom? Use night lights. Make sure  that you have a light by your bed that is easy to reach. Do not use any sheets or blankets that are too big for your bed. They should not hang down onto the floor. Have a firm chair that has side arms. You can use this for support while you get dressed. Do not have throw rugs and other things on the floor that can make you trip. What can I do in the kitchen? Clean up any spills right away. Avoid walking on wet floors. Keep items that you use a lot in easy-to-reach places. If you need to reach something above you, use a strong step stool that has a grab bar. Keep electrical cords out of the way. Do not use floor polish or wax that makes floors slippery. If you must use wax, use non-skid floor wax. Do not have throw rugs and other things on the floor that can make you trip. What can I do with my stairs? Do not leave any items on the stairs. Make sure that there are handrails on both sides of the stairs and use them. Fix handrails that are broken or loose. Make sure that handrails are as long as the stairways. Check any carpeting to make sure that it is firmly attached to the stairs. Fix any carpet that is loose or worn. Avoid having throw rugs at the top or bottom of the stairs. If you do have throw rugs, attach them to  the floor with carpet tape. Make sure that you have a light switch at the top of the stairs and the bottom of the stairs. If you do not have them, ask someone to add them for you. What else can I do to help prevent falls? Wear shoes that: Do not have high heels. Have rubber bottoms. Are comfortable and fit you well. Are closed at the toe. Do not wear sandals. If you use a stepladder: Make sure that it is fully opened. Do not climb a closed stepladder. Make sure that both sides of the stepladder are locked into place. Ask someone to hold it for you, if possible. Clearly mark and make sure that you can see: Any grab bars or handrails. First and last steps. Where the edge of  each step is. Use tools that help you move around (mobility aids) if they are needed. These include: Canes. Walkers. Scooters. Crutches. Turn on the lights when you go into a dark area. Replace any light bulbs as soon as they burn out. Set up your furniture so you have a clear path. Avoid moving your furniture around. If any of your floors are uneven, fix them. If there are any pets around you, be aware of where they are. Review your medicines with your doctor. Some medicines can make you feel dizzy. This can increase your chance of falling. Ask your doctor what other things that you can do to help prevent falls. This information is not intended to replace advice given to you by your health care provider. Make sure you discuss any questions you have with your health care provider. Document Released: 02/23/2009 Document Revised: 10/05/2015 Document Reviewed: 06/03/2014 Elsevier Interactive Patient Education  2017 [employer]. 2

## 2021-10-18 NOTE — Progress Notes (Signed)
Virtual Visit via Telephone Note  I connected with  Kristine [name] on 10/18/21 at  3:30 PM EDT by telephone and verified that I am speaking with the correct person using two identifiers.  Location: Patient: home Provider: [place] Persons participating in the virtual visit: [place]   I discussed the limitations, risks, security and privacy concerns of performing an evaluation and management service by telephone and the availability of in person appointments. The patient expressed understanding and agreed to proceed.  Interactive audio and video telecommunications were attempted between this nurse and patient, however failed, due to patient having technical difficulties OR patient did not have access to video capability.  We continued and completed visit with audio only.  Some vital signs may be absent or patient reported.   Kristine David, LPN  Subjective:   Kristine [name] is a 66 y.o. female who presents for Medicare Annual (Subsequent) preventive examination.  Review of Systems     Cardiac Risk Factors include: advanced age (>81mn, >>39women)     Objective:    There were no vitals filed for this visit. There is no height or weight on file to calculate BMI.     10/18/2021    3:35 PM 10/17/2020    3:33 PM  Advanced Directives  Does Patient Have a Medical Advance Directive? No Yes  Type of ACorporate treasurerof AWalker MillLiving will  Copy of [place] Chart?  No - copy requested  Would patient like information on creating a medical advance directive? No - Patient declined     Current Medications (verified) Outpatient Encounter Medications as of 10/18/2021  Medication Sig   acetaminophen (TYLENOL) 500 MG tablet Take 500 mg by mouth every 6 (six) hours as needed.   acyclovir ointment (ZOVIRAX) 5 % Apply 1 application topically every 3 (three) hours. To cold sore as needed   albuterol (PROVENTIL  HFA;VENTOLIN HFA) 108 (90 Base) MCG/ACT inhaler INHALE 1-2 PUFFS INTO THE LUNGS EVERY 6 (SIX) HOURS AS NEEDED FOR WHEEZING OR SHORTNESS OF BREATH.   ALPRAZolam (XANAX) 0.25 MG tablet Take 1 tablet (0.25 mg total) by mouth at bedtime as needed for anxiety.   aspirin 325 MG tablet Take 325 mg by mouth daily.   buPROPion (WELLBUTRIN XL) 150 MG 24 hr tablet TAKE 1 TABLET EVERY DAY   Cetirizine HCl (ZYRTEC PO) Take 1 tablet by mouth daily.   Cholecalciferol (VITAMIN D3 PO) Take 5,000 Units by mouth daily.   ciclopirox (PENLAC) 8 % solution Apply topically daily.   cyclobenzaprine (FLEXERIL) 10 MG tablet Take 0.5 tablets (5 mg total) by mouth 3 (three) times daily as needed for muscle spasms.   ESOMEPRAZOLE MAGNESIUM PO Take 1 tablet by mouth daily.   fluticasone (FLONASE) 50 MCG/ACT nasal spray PLACE 2 SPRAYS INTO THE NOSE DAILY AS NEEDED   furosemide (LASIX) 20 MG tablet Take 1 tablet (20 mg total) by mouth 2 (two) times a week. AND AS NEEDED   loratadine (CLARITIN) 10 MG tablet Take 10 mg by mouth as needed for allergies. Reported on 05/19/2015   meclizine (ANTIVERT) 25 MG tablet Take 1 tablet (25 mg total) by mouth as needed for dizziness.   metoprolol succinate (TOPROL-XL) 25 MG 24 hr tablet Take 1 tablet (25 mg total) by mouth daily.   NON FORMULARY PLEXUS: PRO BIO 5   potassium chloride (KLOR-CON) 10 MEQ tablet Take 1 tablet (10 mEq total) by mouth daily. AND AS  NEEDED WITH YOUR LASIX   Probiotic Product (PROBIOTIC PO) Take 1 tablet by mouth daily.   simvastatin (ZOCOR) 10 MG tablet Take 1 tablet (10 mg total) by mouth at bedtime.   valACYclovir (VALTREX) 1000 MG tablet Take 2 tablets (2,000 mg total) by mouth 2 (two) times daily. For one day for cold sore (Patient taking differently: Take 2,000 mg by mouth as needed. For one day for cold sore)   No facility-administered encounter medications on file as of 10/18/2021.    Allergies (verified) Oxycodone-acetaminophen, Paroxetine, Paroxetine hcl,  Sulfa antibiotics, Amoxicillin-pot clavulanate, Amoxicillin-pot clavulanate, Calcium, Erythromycin, Guaifenesin, Naproxen, Oxycodone-acetaminophen, Oxycodone-acetaminophen, Oxycodone-aspirin, Oxycodone-aspirin, Sertraline hcl, Sertraline hcl, and Sertraline   History: Past Medical History:  Diagnosis Date   Anxiety    Basal cell cancer    Basal cell carcinoma    face   Depression    DVT (deep venous thrombosis) ([place]) 1/08   Esophageal erosions    Factor V Leiden ([place])    On coag x 1 year ending in [place]   hospital- abd pain   GERD (gastroesophageal reflux disease)    Radial head fracture 2011   Past Surgical History:  Procedure Laterality Date   CHOLECYSTECTOMY     COLONOSCOPY     MOHS SURGERY  7/11   basal cell carcinoma near eye   OOPHORECTOMY     SHOULDER SURGERY  04/2006   TONSILLECTOMY  1976   UPPER GASTROINTESTINAL ENDOSCOPY     Family History  Problem Relation Age of Onset   Graves' disease Mother    Depression Mother    Osteoporosis Mother    Heart attack Father        x 3    Breast cancer Sister    Heart attack Brother        x 2   Diabetes Brother    Hypertension Brother    Hypertension Sister    Coronary artery disease Brother        with stents    Atrial fibrillation Brother    Colon cancer Neg Hx    Social History   Socioeconomic History   Marital status: Married    Spouse name: Not on file   Number of children: 1   Years of education: Not on file   Highest education level: Not on file  Occupational History   Occupation: Nurse     Employer: [employer]  Tobacco Use   Smoking status: Never   Smokeless tobacco: Never  Vaping Use   Vaping Use: Never used  Substance and Sexual Activity   Alcohol use: Yes    Alcohol/week: 0.0 standard drinks of alcohol    Comment: occ   Drug use: No   Sexual activity: Not on file  Other Topics Concern   Not on file  Social History Narrative   Some walking for exercise          Hematology - Dr. Catarina Hartshorn - Dr. Latanya Maudlin   [facility] Dr. Rosana Berger   Social Determinants of Health   Financial Resource Strain: Low Risk  (10/18/2021)   Overall Financial Resource Strain (CARDIA)    Difficulty of Paying Living Expenses: Not hard at all  Food Insecurity: No Food Insecurity (10/18/2021)   Hunger Vital Sign    Worried About Running Out of Food in the Last Year: Never true    Ran Out of Food in the Last Year: Never true  Transportation Needs: No Transportation Needs (  10/18/2021)   PRAPARE - Hydrologist (Medical): No    Lack of Transportation (Non-Medical): No  Physical Activity: Sufficiently Active (10/18/2021)   Exercise Vital Sign    Days of Exercise per Week: 4 days    Minutes of Exercise per Session: 60 min  Stress: No Stress Concern Present (10/18/2021)   [place]    Feeling of Stress : Not at all  Social Connections: [place] (10/18/2021)   Social Connection and Isolation Panel [NHANES]    Frequency of Communication with Friends and Family: More than three times a week    Frequency of Social Gatherings with Friends and Family: Twice a week    Attends Religious Services: More than 4 times per year    Active Member of [employer] or Organizations: Yes    Attends Music therapist: More than 4 times per year    Marital Status: Married    Tobacco Counseling Counseling given: Not Answered   Clinical Intake:  Pre-visit preparation completed: Yes  Pain : No/denies pain     Nutritional Risks: None Diabetes: No  How often do you need to have someone help you when you read instructions, pamphlets, or other written materials from your doctor or pharmacy?: 1 - Never  Diabetic?no  Interpreter Needed?: No  Information entered by :: Kirke Shaggy, LPN   Activities of Daily Living    10/18/2021    3:37 PM  In your present state of health,  do you have any difficulty performing the following activities:  Hearing? 0  Vision? 0  Difficulty concentrating or making decisions? 0  Walking or climbing stairs? 0  Dressing or bathing? 0  Doing errands, shopping? 0  Preparing Food and eating ? N  Using the Toilet? N  In the past six months, have you accidently leaked urine? N  Do you have problems with loss of bowel control? N  Managing your Medications? N  Managing your Finances? N  Housekeeping or managing your Housekeeping? N    Patient Care Team: Tower, Wynelle Fanny, MD as PCP - General Fay Records, MD as PCP - Cardiology (Cardiology)  Indicate any recent Medical Services you may have received from other than [facility] providers in the past year (date may be approximate).     Assessment:   This is a routine wellness examination for [place].  Hearing/Vision screen Hearing Screening - Comments:: No aids Vision Screening - Comments:: Readers- Dr.McCracken  Dietary issues and exercise activities discussed: Current Exercise Habits: Home exercise routine, Type of exercise: walking, Time (Minutes): 60, Frequency (Times/Week): 4, Weekly Exercise (Minutes/Week): 240, Intensity: Mild   Goals Addressed             This Visit's Progress    DIET - EAT MORE FRUITS AND VEGETABLES         Depression Screen    10/18/2021    3:33 PM 10/17/2020    3:35 PM 06/08/2019    3:38 PM 04/03/2018    9:56 AM 03/11/2017   12:52 PM  PHQ 2/9 Scores  PHQ - 2 Score 0 0 2 0 0  PHQ- 9 Score 0 0 2      Fall Risk    10/18/2021    3:36 PM 10/17/2020    3:34 PM  [place] in the past year? 0 0  Number falls in past yr: 0 0  Injury with Fall? 0 0  Risk for fall due to : No Fall Risks Medication side effect  Follow up Falls evaluation completed Falls evaluation completed;Falls prevention discussed    FALL RISK PREVENTION PERTAINING TO THE HOME:  Any stairs in or around the home? Yes  If so, are there any without handrails? No  Home  free of loose throw rugs in walkways, pet beds, electrical cords, etc? Yes  Adequate lighting in your home to reduce risk of falls? Yes   ASSISTIVE DEVICES UTILIZED TO PREVENT FALLS:  Life alert? No  Use of a cane, walker or w/c? No  Grab bars in the bathroom? No  Shower chair or bench in shower? Yes  Elevated toilet seat or a handicapped toilet? No    Cognitive Function:    10/17/2020    3:46 PM  MMSE - Mini Mental State Exam  Orientation to time 5  Orientation to Place 5  Registration 3  Recall 3  Language- repeat 1        10/18/2021    3:37 PM  6CIT Screen  What Year? 0 points  What month? 0 points  What time? 0 points  Count back from 20 0 points  Months in reverse 0 points  Repeat phrase 0 points  Total Score 0 points    Immunizations Immunization History  Administered Date(s) Administered   Fluad Quad(high Dose 65+) 03/01/2021   Hepatitis B 05/13/1994   Influenza Split 03/21/2011, 02/03/2013, 02/22/2014   Influenza Whole 02/21/2005, 03/02/2007, 02/09/2009, 03/22/2010   Influenza,inj,Quad PF,6+ Mos 03/29/2015, 03/05/2017, 02/19/2018, 03/02/2019   Influenza-Unspecified 03/28/2015   PFIZER Comirnaty(Gray Top)Covid-19 Tri-Sucrose Vaccine 07/10/2019   PFIZER(Purple Top)SARS-COV-2 Vaccination 07/10/2019, 07/30/2019, 02/23/2020   PNEUMOCOCCAL CONJUGATE-20 10/24/2020   Pneumococcal Polysaccharide-23 01/24/2003, 12/07/2007   Pneumococcal-Unspecified 05/13/2013, 10/24/2020   Td 12/07/2007   Tdap 05/13/2013, 04/03/2018   Unspecified SARS-COV-2 Vaccination 07/10/2019, 07/30/2019, 02/22/2020   Zoster, Live 03/07/2016    TDAP status: Up to date  Flu Vaccine status: Declined, Education has been provided regarding the importance of this vaccine but patient still declined. Advised may receive this vaccine at local pharmacy or Health Dept. Aware to provide a copy of the vaccination record if obtained from local pharmacy or Health Dept. Verbalized acceptance and  understanding.  Pneumococcal vaccine status: Up to date  Covid-19 vaccine status: Completed vaccines  Qualifies for Shingles Vaccine? Yes   Zostavax completed Yes   Shingrix Completed?: No.    Education has been provided regarding the importance of this vaccine. Patient has been advised to call insurance company to determine out of pocket expense if they have not yet received this vaccine. Advised may also receive vaccine at local pharmacy or Health Dept. Verbalized acceptance and understanding.  Screening Tests Health Maintenance  Topic Date Due   Zoster Vaccines- Shingrix (1 of 2) Never done   DEXA SCAN  Never done   COLONOSCOPY (Pts 45-65yr Insurance coverage will need to be confirmed)  06/06/2021   INFLUENZA VACCINE  12/11/2021   MAMMOGRAM  07/10/2022   TETANUS/TDAP  04/03/2028   Pneumonia Vaccine 66 Years old  Completed   COVID-19 Vaccine  Completed   Hepatitis C Screening  Completed   HPV VACCINES  Aged Out    Health Maintenance  Health Maintenance Due  Topic Date Due   Zoster Vaccines- Shingrix (1 of 2) Never done   DEXA SCAN  Never done   COLONOSCOPY (Pts 45-427yrInsurance coverage will need to be confirmed)  06/06/2021    Colorectal cancer screening: Type of screening:  Cologuard. Completed 01/13/20. Repeat every 3 years  Mammogram status: Completed 07/10/20. Repeat every year- already had in March 2023  Bone Density status: Completed March 2023. Results reflect: Bone density results: NORMAL. Repeat every 5 years.  Lung Cancer Screening: (Low Dose CT Chest recommended if Age 86-80 years, 30 pack-year currently smoking OR have quit w/in 15years.) does not qualify.    Additional Screening:  Hepatitis C Screening: does qualify; Completed 12/27/15  Vision Screening: Recommended annual ophthalmology exams for early detection of glaucoma and other disorders of the eye. Is the patient up to date with their annual eye exam?  Yes  Who is the provider or what is the name  of the office in which the patient attends annual eye exams? [facility] If pt is not established with a provider, would they like to be referred to a provider to establish care? No .   Dental Screening: Recommended annual dental exams for proper oral hygiene  Community Resource Referral / Chronic Care Management: CRR required this visit?  No   CCM required this visit?  No      Plan:     I have personally reviewed and noted the following in the patient's chart:   Medical and social history Use of alcohol, tobacco or illicit drugs  Current medications and supplements including opioid prescriptions.  Functional ability and status Nutritional status Physical activity Advanced directives List of other physicians Hospitalizations, surgeries, and ER visits in previous 12 months Vitals Screenings to include cognitive, depression, and falls Referrals and appointments  In addition, I have reviewed and discussed with patient certain preventive protocols, quality metrics, and best practice recommendations. A written personalized care plan for preventive services as well as general preventive health recommendations were provided to patient.     Kristine David, LPN   09/18/1636   Nurse Notes: none

## 2021-10-31 IMAGING — CT CT CARDIAC CORONARY ARTERY CALCIUM SCORE
3 series · 14 of 20 positions shown, 15 images · non-contrast
Comparison: 06/30/2006
COMPARISON: 06/30/2006

Addendum:
EXAM:
OVER-READ INTERPRETATION  CT CHEST

The following report is an over-read performed by radiologist Dr.
Munyeshaka Bacanamwo [REDACTED] on 04/04/2020. This
over-read does not include interpretation of cardiac or coronary
anatomy or pathology. The coronary calcium score interpretation by
the cardiologist is attached.
CLINICAL DATA: Risk stratification
Coronary Calcium Score
TECHNIQUE: The patient was scanned on a Siemens Sensation 16 slice scanner.
Axial non-contrast 3mm slices were carried out through the heart.
The data set was analyzed on a dedicated work station and scored
using the Agatson method.

[Series 2: casc 3.0 bv41 2 bestdiast 73 % · axial · 0.34mm/px · z∈[-244,-172]mm · 4 of 40 slices shown, 5 images]
[im 8/40  vessel]
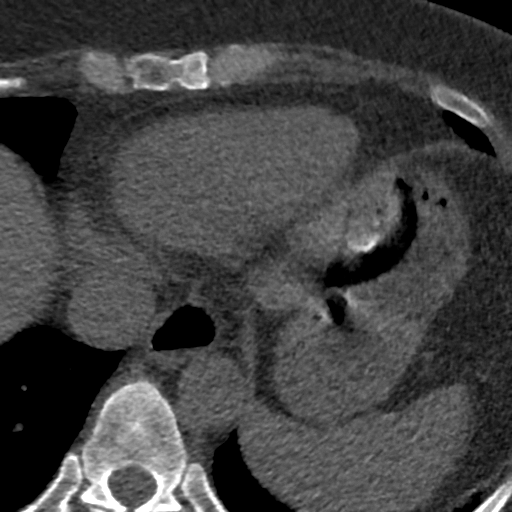
[im 8/40  lung]
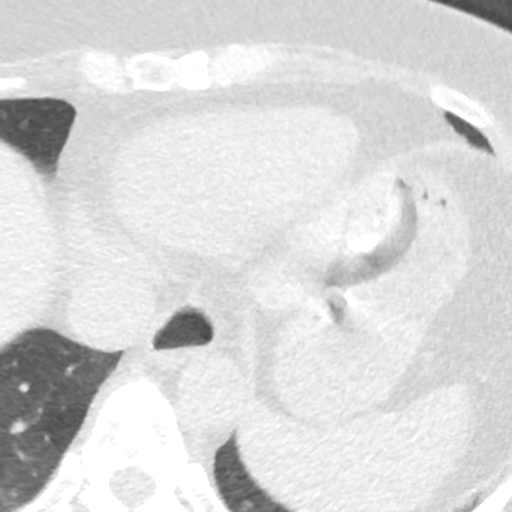
[im 16/40  vessel]
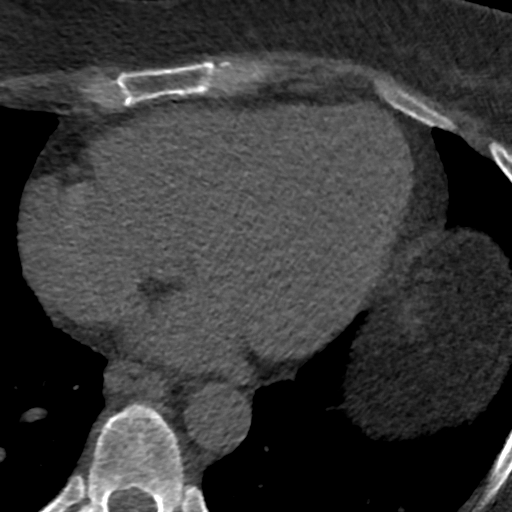
[im 24/40  vessel]
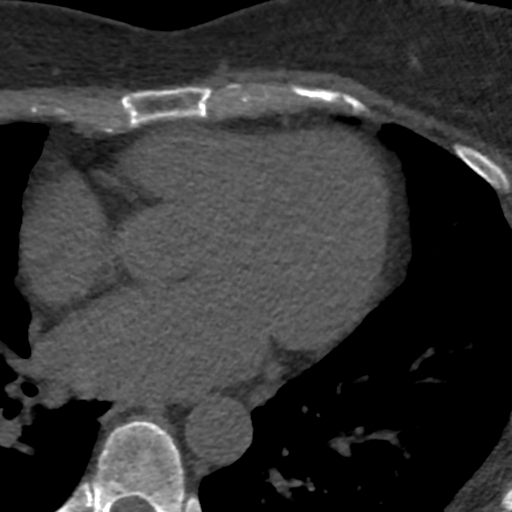
[im 32/40  vessel]
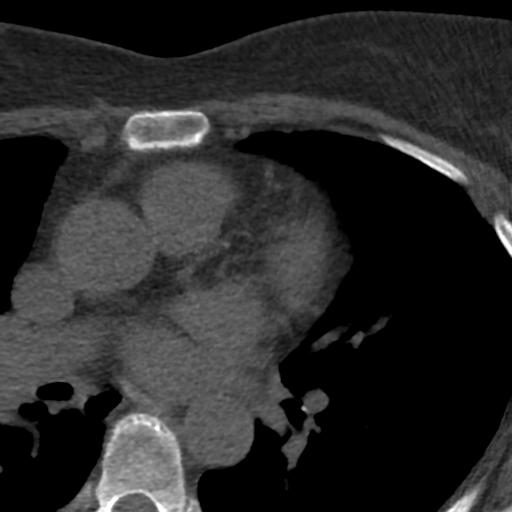

[Series 3: lung 73 % · axial · 0.63mm/px · z∈[-246,-168]mm · 5 of 40 slices shown]
[im 7/40  lung]
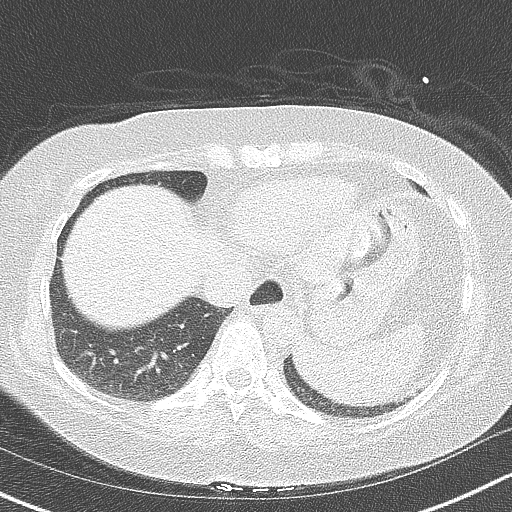
[im 14/40  lung]
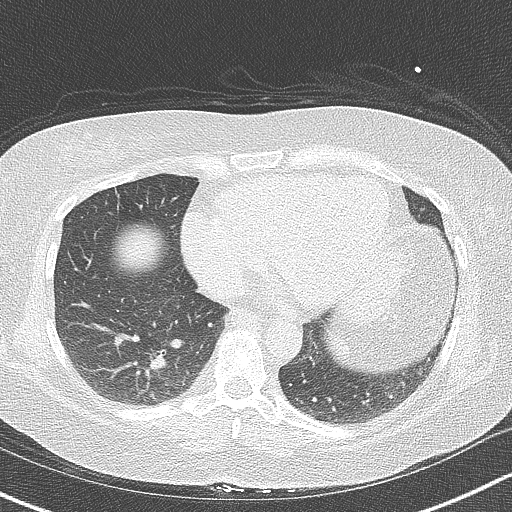
[im 20/40  lung]
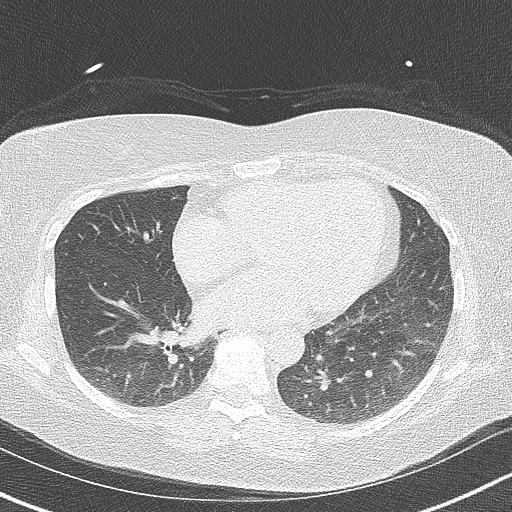
[im 27/40  lung]
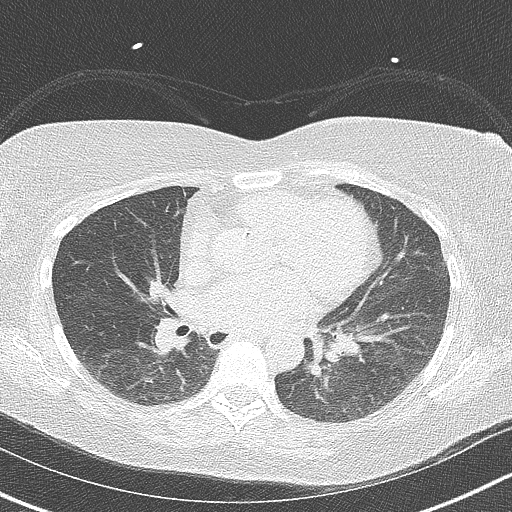
[im 33/40  lung]
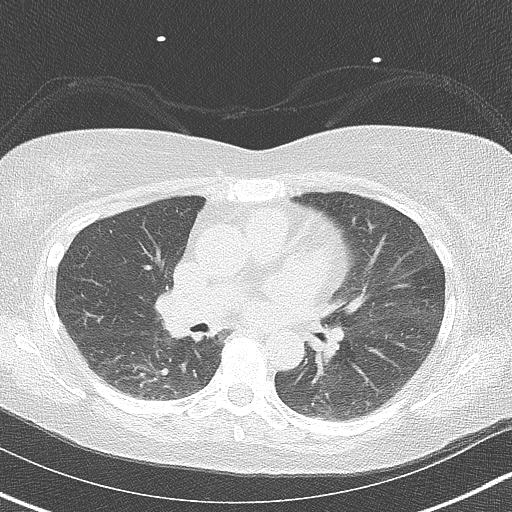

[Series 4: lung st 73 % · axial · 0.63mm/px · z∈[-246,-168]mm · 5 of 40 slices shown]
[im 7/40  lung]
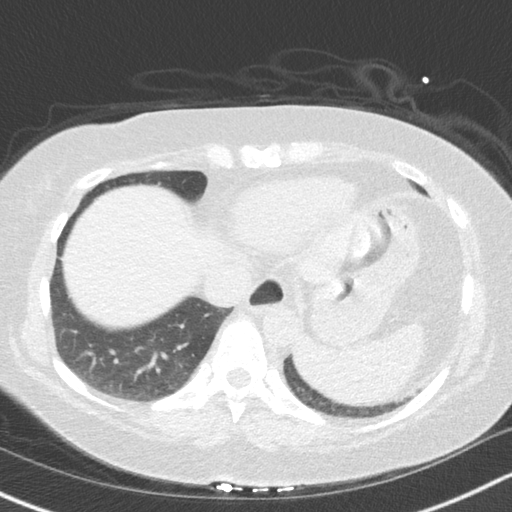
[im 14/40  lung]
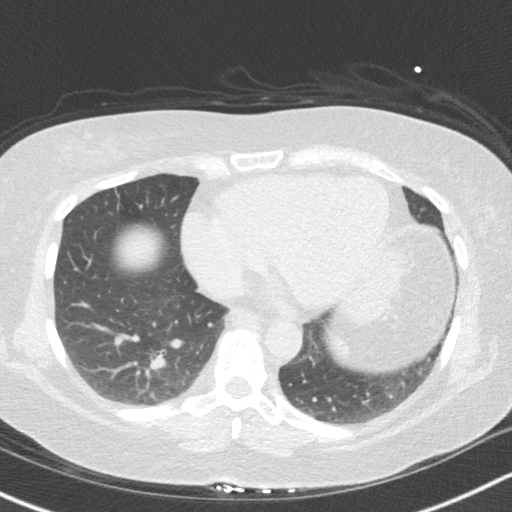
[im 20/40  lung]
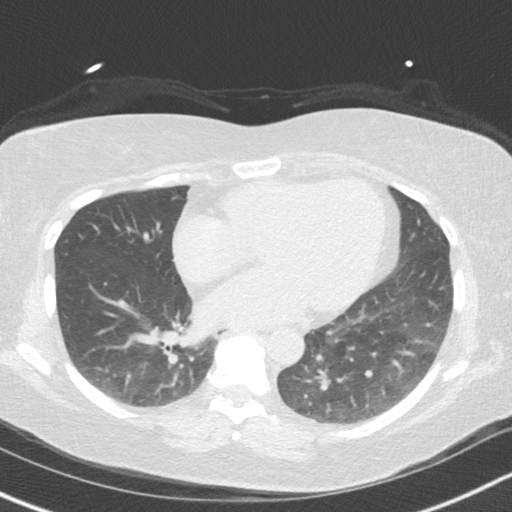
[im 27/40  lung]
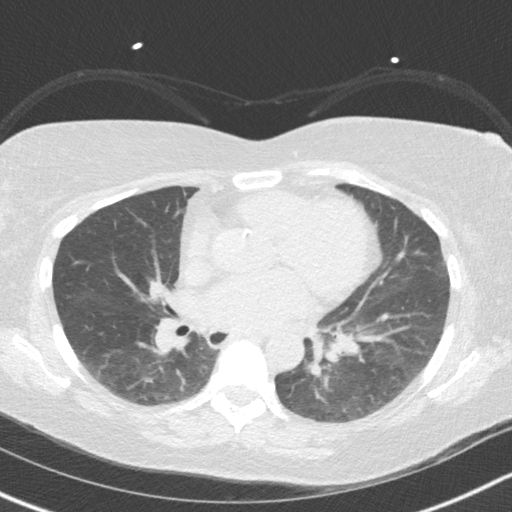
[im 33/40  lung]
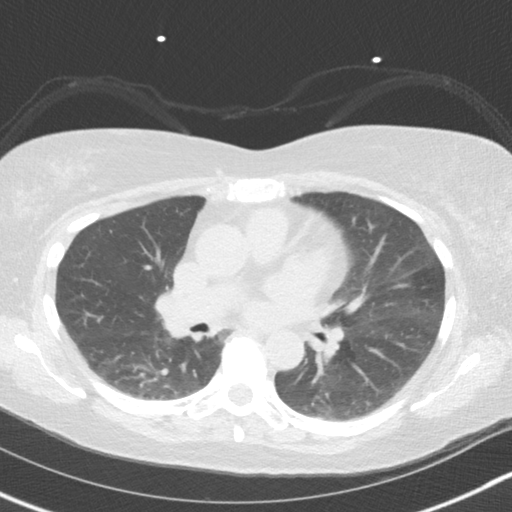

[14 of 20 positions shown; findings below may reference images not displayed]

FINDINGS: Vascular: Heart is normal size. Scattered calcifications in the
aortic root. No aneurysm.

Mediastinum/Nodes: No adenopathy.

Lungs/Pleura: No confluent opacities or effusions.

Upper Abdomen: Imaging into the upper abdomen demonstrates no acute
findings.

Musculoskeletal: Chest wall soft tissues are unremarkable. No acute
bony abnormality.
IMPRESSION: Scattered calcifications in the aortic root.

No acute findings.
FINDINGS: Non-cardiac: See separate report from [REDACTED].

Ascending Aorta: Normal caliber

Pericardium: Normal

Coronary arteries: Normal origins

Mild aortic valve calcification.
IMPRESSION: Coronary calcium score of 0. This suggests low risk for future
cardiac events.

Zo Goodin

*** End of Addendum ***
EXAM:
OVER-READ INTERPRETATION  CT CHEST

The following report is an over-read performed by radiologist Dr.
Munyeshaka Bacanamwo [REDACTED] on 04/04/2020. This
over-read does not include interpretation of cardiac or coronary
anatomy or pathology. The coronary calcium score interpretation by
the cardiologist is attached.
FINDINGS: Vascular: Heart is normal size. Scattered calcifications in the
aortic root. No aneurysm.

Mediastinum/Nodes: No adenopathy.

Lungs/Pleura: No confluent opacities or effusions.

Upper Abdomen: Imaging into the upper abdomen demonstrates no acute
findings.

Musculoskeletal: Chest wall soft tissues are unremarkable. No acute
bony abnormality.
IMPRESSION: Scattered calcifications in the aortic root.

No acute findings.

## 2021-11-20 ENCOUNTER — Other Ambulatory Visit: Payer: Self-pay | Admitting: Family Medicine

## 2021-11-20 NOTE — Telephone Encounter (Signed)
Last filled on 09/04/21 Last ov08/16/23 Next ov 12/05/21

## 2021-11-21 ENCOUNTER — Ambulatory Visit: Payer: Medicare Other | Admitting: Family Medicine

## 2021-11-26 ENCOUNTER — Telehealth: Payer: Self-pay | Admitting: Family Medicine

## 2021-11-26 NOTE — Telephone Encounter (Signed)
Patient called and stated she was billed for annual visit for $295 and the appointment on 10/18/2021 was a nurse well call. Call back number [phone].

## 2021-11-26 NOTE — Telephone Encounter (Signed)
Amy, Can you help with this.

## 2021-12-03 ENCOUNTER — Encounter: Payer: Self-pay | Admitting: Family Medicine

## 2021-12-03 DIAGNOSIS — I10 Essential (primary) hypertension: Secondary | ICD-10-CM

## 2021-12-03 DIAGNOSIS — R7303 Prediabetes: Secondary | ICD-10-CM

## 2021-12-03 DIAGNOSIS — E7800 Pure hypercholesterolemia, unspecified: Secondary | ICD-10-CM

## 2021-12-03 NOTE — Telephone Encounter (Signed)
Spoke to patient by telephone and lab appointment scheduled for tomorrow 12/04/21 at 8:15 am. Lab orders need to be placed.

## 2021-12-04 ENCOUNTER — Other Ambulatory Visit (INDEPENDENT_AMBULATORY_CARE_PROVIDER_SITE_OTHER): Payer: Medicare Other

## 2021-12-04 DIAGNOSIS — R7303 Prediabetes: Secondary | ICD-10-CM | POA: Diagnosis not present

## 2021-12-04 DIAGNOSIS — E7800 Pure hypercholesterolemia, unspecified: Secondary | ICD-10-CM | POA: Diagnosis not present

## 2021-12-04 DIAGNOSIS — I10 Essential (primary) hypertension: Secondary | ICD-10-CM

## 2021-12-04 LAB — CBC WITH DIFFERENTIAL/PLATELET
Basophils Absolute: 0.1 10*3/uL (ref 0.0–0.1)
Basophils Relative: 1.8 % (ref 0.0–3.0)
Eosinophils Absolute: 0.3 10*3/uL (ref 0.0–0.7)
Eosinophils Relative: 4.2 % (ref 0.0–5.0)
HCT: 41.6 % (ref 36.0–46.0)
Hemoglobin: 13.7 g/dL (ref 12.0–15.0)
Lymphocytes Relative: 25.2 % (ref 12.0–46.0)
Lymphs Abs: 1.6 10*3/uL (ref 0.7–4.0)
MCHC: 32.9 g/dL (ref 30.0–36.0)
MCV: 87.1 fl (ref 78.0–100.0)
Monocytes Absolute: 0.5 10*3/uL (ref 0.1–1.0)
Monocytes Relative: 7.9 % (ref 3.0–12.0)
Neutro Abs: 3.9 10*3/uL (ref 1.4–7.7)
Neutrophils Relative %: 60.9 % (ref 43.0–77.0)
Platelets: 187 10*3/uL (ref 150.0–400.0)
RBC: 4.78 Mil/uL (ref 3.87–5.11)
RDW: 13.4 % (ref 11.5–15.5)
WBC: 6.4 10*3/uL (ref 4.0–10.5)

## 2021-12-04 LAB — LIPID PANEL
Cholesterol: 167 mg/dL (ref 0–200)
HDL: 62.9 mg/dL (ref >39.00)
LDL Cholesterol: 87 mg/dL (ref 0–99)
NonHDL: 104.13
Total CHOL/HDL Ratio: 3
Triglycerides: 88 mg/dL (ref 0.0–149.0)
VLDL: 17.6 mg/dL (ref 0.0–40.0)

## 2021-12-04 LAB — COMPREHENSIVE METABOLIC PANEL
ALT: 15 U/L (ref 0–35)
AST: 16 U/L (ref 0–37)
Albumin: 4.1 g/dL (ref 3.5–5.2)
Alkaline Phosphatase: 68 U/L (ref 39–117)
BUN: 12 mg/dL (ref 6–23)
CO2: 28 mEq/L (ref 19–32)
Calcium: 9.1 mg/dL (ref 8.4–10.5)
Chloride: 101 mEq/L (ref 96–112)
Creatinine, Ser: 0.72 mg/dL (ref 0.40–1.20)
GFR: 86.6 mL/min (ref >60.00)
Glucose, Bld: 120 mg/dL — ABNORMAL HIGH (ref 70–99)
Potassium: 4.6 mEq/L (ref 3.5–5.1)
Sodium: 137 mEq/L (ref 135–145)
Total Bilirubin: 0.8 mg/dL (ref 0.2–1.2)
Total Protein: 6.2 g/dL (ref 6.0–8.3)

## 2021-12-04 LAB — HEMOGLOBIN A1C: Hgb A1c MFr Bld: 6.5 % (ref 4.6–6.5)

## 2021-12-04 LAB — TSH: TSH: 2.34 u[IU]/mL (ref 0.35–5.50)

## 2021-12-05 ENCOUNTER — Ambulatory Visit (INDEPENDENT_AMBULATORY_CARE_PROVIDER_SITE_OTHER): Payer: Medicare Other | Admitting: Family Medicine

## 2021-12-05 ENCOUNTER — Encounter: Payer: Self-pay | Admitting: Family Medicine

## 2021-12-05 VITALS — BP 124/82 | HR 81 | Temp 98.9°F | Ht 64.5 in | Wt 201.0 lb

## 2021-12-05 DIAGNOSIS — E6609 Other obesity due to excess calories: Secondary | ICD-10-CM

## 2021-12-05 DIAGNOSIS — E7800 Pure hypercholesterolemia, unspecified: Secondary | ICD-10-CM

## 2021-12-05 DIAGNOSIS — E66811 Obesity, class 1: Secondary | ICD-10-CM

## 2021-12-05 DIAGNOSIS — F418 Other specified anxiety disorders: Secondary | ICD-10-CM | POA: Diagnosis not present

## 2021-12-05 DIAGNOSIS — K2100 Gastro-esophageal reflux disease with esophagitis, without bleeding: Secondary | ICD-10-CM | POA: Diagnosis not present

## 2021-12-05 DIAGNOSIS — R7303 Prediabetes: Secondary | ICD-10-CM

## 2021-12-05 DIAGNOSIS — Z6833 Body mass index (BMI) 33.0-33.9, adult: Secondary | ICD-10-CM

## 2021-12-05 DIAGNOSIS — J0100 Acute maxillary sinusitis, unspecified: Secondary | ICD-10-CM

## 2021-12-05 DIAGNOSIS — J019 Acute sinusitis, unspecified: Secondary | ICD-10-CM | POA: Insufficient documentation

## 2021-12-05 DIAGNOSIS — I10 Essential (primary) hypertension: Secondary | ICD-10-CM

## 2021-12-05 MED ORDER — ALPRAZOLAM 0.25 MG PO TABS: 0.2500 mg | ORAL_TABLET | Freq: Every evening | ORAL | 0 refills | Status: DC | PRN

## 2021-12-05 MED ORDER — MECLIZINE HCL 25 MG PO TABS: 25.0000 mg | ORAL_TABLET | ORAL | 1 refills | Status: DC | PRN

## 2021-12-05 MED ORDER — CYCLOBENZAPRINE HCL 10 MG PO TABS: 5.0000 mg | ORAL_TABLET | Freq: Three times a day (TID) | ORAL | 1 refills | Status: DC | PRN

## 2021-12-05 MED ORDER — AZITHROMYCIN 250 MG PO TABS: ORAL_TABLET | ORAL | 0 refills | Status: DC

## 2021-12-05 NOTE — Progress Notes (Signed)
Subjective:    Patient ID: Kristine [name], female    DOB: [birth year], 66 y.o.   MRN: [id number]  HPI Pt presents for annual f/u of chronic medical problems and some sinus symptoms   Wt Readings from Last 3 Encounters:  12/05/21 201 lb (91.2 kg)  10/18/21 202 lb (91.6 kg)  07/31/21 202 lb (91.6 kg)   33.97 kg/m  Doing ok overall  Sinus problems  Chronic congestion  Mucous is a little green  Allergies bother her  Uses flonase / claritin  Was tender in cheeks this week  Felt really tired  A little cough   Has not done covid testing because is chronic  Last one was neg a month ago    Immunization History  Administered Date(s) Administered   Fluad Quad(high Dose 65+) 03/01/2021   Hepatitis B 05/13/1994   Influenza Split 03/21/2011, 02/03/2013, 02/22/2014   Influenza Whole 02/21/2005, 03/02/2007, 02/09/2009, 03/22/2010   Influenza,inj,Quad PF,6+ Mos 03/29/2015, 03/05/2017, 02/19/2018, 03/02/2019   Influenza-Unspecified 03/28/2015   PFIZER Comirnaty(Gray Top)Covid-19 Tri-Sucrose Vaccine 07/10/2019   PFIZER(Purple Top)SARS-COV-2 Vaccination 07/10/2019, 07/30/2019, 02/23/2020   PNEUMOCOCCAL CONJUGATE-20 10/24/2020   Pneumococcal Polysaccharide-23 01/24/2003, 12/07/2007   Pneumococcal-Unspecified 05/13/2013, 10/24/2020   Td 12/07/2007   Tdap 05/13/2013, 04/03/2018   Unspecified SARS-COV-2 Vaccination 07/10/2019, 07/30/2019, 02/22/2020   Zoster, Live 03/07/2016   Health Maintenance Due  Topic Date Due   Zoster Vaccines- Shingrix (1 of 2) Never done   DEXA SCAN  Never done   COLONOSCOPY (Pts 45-33yr Insurance coverage will need to be confirmed)  06/06/2021   Shingrix : is interested  Plans a flu shot in the fall   Dexa : just had one (sent for)  Falls: none  Fractures: none Supplements vitamin D Exercise : lots of exercise /more consistent for past 2 months up to 6 d per week at the gym  Also a trainer   Mammogram in [place]  Self breast exam: no  lumps   Sees gyn Dr GHelane Rima Doing the [place] treatment for atrophic vaginitis   Had a basal skin cancer on nose- planning 2nd surgery- moh's  At [place] is nervous about this one   Depresion/anxiety     12/05/2021    8:59 AM 10/18/2021    3:33 PM 10/17/2020    3:35 PM 06/08/2019    3:38 PM 04/03/2018    9:56 AM  Depression screen PHQ 2/9  Decreased Interest 0 0 0 1 0  Down, Depressed, Hopeless 0 0 0 1 0  PHQ - 2 Score 0 0 0 2 0  Altered sleeping 2 0 0 0   Tired, decreased energy 1 0 0 0   Change in appetite 0 0 0 0   Feeling bad or failure about yourself  0 0 0 0   Trouble concentrating 0 0 0 0   Moving slowly or fidgety/restless 0 0 0 0   Suicidal thoughts 0 0 0 0   PHQ-9 Score 3 0 0 2   Difficult doing work/chores  Not difficult at all Not difficult at all       HTN bp is stable today better today with great exercise  No cp or palpitations or headaches or edema  No side effects to medicines  BP Readings from Last 3 Encounters:  12/05/21 124/82  07/31/21 139/89  12/26/20 (!) 142/76    Metoprolol xl 25 mg daily  Lasix 20 mg twice weekly   Colon cancer screening , colonoscopy  05/2011 Cologuard negative 2 y ago 01/13/20    GERD Takes generic nexium  Hyperlipidemia Lab Results  Component Value Date   CHOL 167 12/04/2021   CHOL 161 07/27/2021   CHOL 207 (H) 10/19/2020   Lab Results  Component Value Date   HDL 62.90 12/04/2021   HDL 69.30 07/27/2021   HDL 66.20 10/19/2020   Lab Results  Component Value Date   LDLCALC 87 12/04/2021   LDLCALC 78 07/27/2021   LDLCALC 123 (H) 10/19/2020   Lab Results  Component Value Date   TRIG 88.0 12/04/2021   TRIG 69.0 07/27/2021   TRIG 90.0 10/19/2020   Lab Results  Component Value Date   CHOLHDL 3 12/04/2021   CHOLHDL 2 07/27/2021   CHOLHDL 3 10/19/2020   Lab Results  Component Value Date   LDLDIRECT 146.1 08/19/2006   Sees cardiology  Last CT calcium score was 0 Simvastatin 10 mg daily  Diet has  been very good   Prediabetes Lab Results  Component Value Date   HGBA1C 6.5 12/04/2021  This is up from 6.2   She is eating very well  On occasion she eats dessert  Honey in coffee  Bread is the biggest weakness  (limits portions)    Lab Results  Component Value Date   CREATININE 0.72 12/04/2021   BUN 12 12/04/2021   NA 137 12/04/2021   K 4.6 12/04/2021   CL 101 12/04/2021   CO2 28 12/04/2021   Lab Results  Component Value Date   ALT 15 12/04/2021   AST 16 12/04/2021   ALKPHOS 68 12/04/2021   BILITOT 0.8 12/04/2021  . Lab Results  Component Value Date   WBC 6.4 12/04/2021   HGB 13.7 12/04/2021   HCT 41.6 12/04/2021   MCV 87.1 12/04/2021   PLT 187.0 12/04/2021   Lab Results  Component Value Date   TSH 2.34 12/04/2021     Patient Active Problem List   Diagnosis Date Noted   Acute sinusitis 12/05/2021   Arm vein blood clot, right 12/26/2020   Estrogen deficiency 10/24/2020   History of pulmonary hypertension 06/08/2019   History of shingles 01/21/2018   Prediabetes 04/21/2015   Stress reaction 04/21/2015   Essential hypertension 04/21/2015   Migraine with aura 06/21/2013   Obesity 06/16/2011   Rhinitis 06/14/2011   Routine general medical examination at a health care facility 06/14/2011   SCIATICA 05/22/2009   GASTROESOPHAGEAL REFLUX DISEASE 04/14/2009   Factor 5 Leiden mutation, heterozygous ([place]) 12/07/2007   Depression with anxiety 11/25/2006   Esophagitis 11/25/2006   IBS 11/25/2006   DVT, HX OF 11/25/2006   MIGRAINES, HX OF 11/25/2006   HYPERCHOLESTEROLEMIA, PURE 10/29/2006   Past Medical History:  Diagnosis Date   Anxiety    Basal cell cancer    Basal cell carcinoma    face   Depression    DVT (deep venous thrombosis) ([place]) 1/08   Esophageal erosions    Factor V Leiden ([place])    On coag x 1 year ending in [place]   hospital- abd pain   GERD (gastroesophageal reflux disease)    Radial head fracture 2011   Past Surgical  History:  Procedure Laterality Date   CHOLECYSTECTOMY     COLONOSCOPY     MOHS SURGERY  7/11   basal cell carcinoma near eye   OOPHORECTOMY     SHOULDER SURGERY  04/2006   TONSILLECTOMY  1976   UPPER GASTROINTESTINAL ENDOSCOPY  Social History   Tobacco Use   Smoking status: Never   Smokeless tobacco: Never  Vaping Use   Vaping Use: Never used  Substance Use Topics   Alcohol use: Yes    Alcohol/week: 0.0 standard drinks of alcohol    Comment: occ   Drug use: No   Family History  Problem Relation Age of Onset   Graves' disease Mother    Depression Mother    Osteoporosis Mother    Heart attack Father        x 3    Breast cancer Sister    Heart attack Brother        x 2   Diabetes Brother    Hypertension Brother    Hypertension Sister    Coronary artery disease Brother        with stents    Atrial fibrillation Brother    Colon cancer Neg Hx    Allergies  Allergen Reactions   Oxycodone-Acetaminophen Rash and Other (See Comments)   Paroxetine Hives   Paroxetine Hcl Hives   Sulfa Antibiotics Other (See Comments)   Amoxicillin-Pot Clavulanate     REACTION: GI   Amoxicillin-Pot Clavulanate Other (See Comments)    REACTION: GI   Calcium     REACTION: muscle aches   Erythromycin Other (See Comments)    REACTION: GI   Guaifenesin     REACTION: GI   Naproxen     REACTION: GI   Oxycodone-Acetaminophen     REACTION: reaction not known   Oxycodone-Acetaminophen Other (See Comments)    Other Reaction: Not Assessed   Oxycodone-Aspirin     REACTION: reaction not known   Oxycodone-Aspirin     REACTION: reaction not known    Sertraline Hcl     REACTION: hives   Sertraline Hcl    Sertraline Rash and Other (See Comments)    REACTION: hives   Current Outpatient Medications on File Prior to Visit  Medication Sig Dispense Refill   acetaminophen (TYLENOL) 500 MG tablet Take 500 mg by mouth every 6 (six) hours as needed.     acyclovir ointment (ZOVIRAX) 5 % Apply  1 application topically every 3 (three) hours. To cold sore as needed 5 g 3   albuterol (PROVENTIL HFA;VENTOLIN HFA) 108 (90 Base) MCG/ACT inhaler INHALE 1-2 PUFFS INTO THE LUNGS EVERY 6 (SIX) HOURS AS NEEDED FOR WHEEZING OR SHORTNESS OF BREATH. 8.5 Inhaler 3   aspirin 325 MG tablet Take 325 mg by mouth daily.     buPROPion (WELLBUTRIN XL) 150 MG 24 hr tablet TAKE 1 TABLET EVERY DAY 90 tablet 1   Cetirizine HCl (ZYRTEC PO) Take 1 tablet by mouth daily.     Cholecalciferol (VITAMIN D3 PO) Take 5,000 Units by mouth daily.     ciclopirox (PENLAC) 8 % solution Apply topically daily.     ESOMEPRAZOLE MAGNESIUM PO Take 1 tablet by mouth daily.     fluticasone (FLONASE) 50 MCG/ACT nasal spray PLACE 2 SPRAYS INTO THE NOSE DAILY AS NEEDED 48 g 3   furosemide (LASIX) 20 MG tablet Take 1 tablet (20 mg total) by mouth 2 (two) times a week. AND AS NEEDED 90 tablet 3   loratadine (CLARITIN) 10 MG tablet Take 10 mg by mouth as needed for allergies. Reported on 05/19/2015     metoprolol succinate (TOPROL-XL) 25 MG 24 hr tablet Take 1 tablet (25 mg total) by mouth daily. 90 tablet 3   NON FORMULARY PLEXUS: PRO BIO 5  potassium chloride (KLOR-CON) 10 MEQ tablet Take 1 tablet (10 mEq total) by mouth daily. AND AS NEEDED WITH YOUR LASIX 90 tablet 3   Probiotic Product (PROBIOTIC PO) Take 1 tablet by mouth daily.     simvastatin (ZOCOR) 10 MG tablet Take 1 tablet (10 mg total) by mouth at bedtime. 90 tablet 3   valACYclovir (VALTREX) 1000 MG tablet Take 2 tablets (2,000 mg total) by mouth 2 (two) times daily. For one day for cold sore (Patient taking differently: Take 2,000 mg by mouth as needed. For one day for cold sore) 12 tablet 1   No current facility-administered medications on file prior to visit.     Review of Systems  Constitutional:  Positive for fatigue. Negative for activity change, appetite change, fever and unexpected weight change.  HENT:  Positive for congestion, sinus pressure and sinus pain.  Negative for ear pain, rhinorrhea and sore throat.   Eyes:  Negative for pain, redness and visual disturbance.  Respiratory:  Negative for cough, shortness of breath and wheezing.   Cardiovascular:  Negative for chest pain and palpitations.  Gastrointestinal:  Negative for abdominal pain, blood in stool, constipation and diarrhea.  Endocrine: Negative for polydipsia and polyuria.  Genitourinary:  Negative for dysuria, frequency and urgency.  Musculoskeletal:  Negative for arthralgias, back pain and myalgias.  Skin:  Negative for pallor and rash.  Allergic/Immunologic: Negative for environmental allergies.  Neurological:  Negative for dizziness, syncope and headaches.  Hematological:  Negative for adenopathy. Does not bruise/bleed easily.  Psychiatric/Behavioral:  Negative for decreased concentration and dysphoric mood. The patient is not nervous/anxious.        Objective:   Physical Exam Constitutional:      General: She is not in acute distress.    Appearance: Normal appearance. She is well-developed. She is obese. She is not ill-appearing or diaphoretic.  HENT:     Head: Normocephalic and atraumatic.     Comments: Some mild sinus tenderness in L frontal area  No facial swelling     Right Ear: Tympanic membrane, ear canal and external ear normal.     Left Ear: Tympanic membrane, ear canal and external ear normal.     Nose: Congestion present.     Mouth/Throat:     Mouth: Mucous membranes are moist.     Pharynx: Oropharynx is clear. No oropharyngeal exudate or posterior oropharyngeal erythema.  Eyes:     General: No scleral icterus.    Extraocular Movements: Extraocular movements intact.     Conjunctiva/sclera: Conjunctivae normal.     Pupils: Pupils are equal, round, and reactive to light.  Neck:     Thyroid: No thyromegaly.     Vascular: No carotid bruit or JVD.  Cardiovascular:     Rate and Rhythm: Normal rate and regular rhythm.     Pulses: Normal pulses.     Heart  sounds: Normal heart sounds.     No gallop.  Pulmonary:     Effort: Pulmonary effort is normal. No respiratory distress.     Breath sounds: Normal breath sounds. No wheezing.     Comments: Good air exch Chest:     Chest wall: No tenderness.  Abdominal:     General: Bowel sounds are normal. There is no distension or abdominal bruit.     Palpations: Abdomen is soft. There is no mass.     Tenderness: There is no abdominal tenderness.     Hernia: No hernia is present.  Genitourinary:  Comments: Breast and pelvic exam are done by gyn Musculoskeletal:        General: No tenderness. Normal range of motion.     Cervical back: Normal range of motion and neck supple. No rigidity. No muscular tenderness.     Right lower leg: No edema.     Left lower leg: No edema.     Comments: No kyphosis   Lymphadenopathy:     Cervical: No cervical adenopathy.  Skin:    General: Skin is warm and dry.     Coloration: Skin is not pale.     Findings: No erythema or rash.     Comments: Solar lentigines diffusely   Neurological:     Mental Status: She is alert. Mental status is at baseline.     Cranial Nerves: No cranial nerve deficit.     Motor: No abnormal muscle tone.     Coordination: Coordination normal.     Gait: Gait normal.     Deep Tendon Reflexes: Reflexes are normal and symmetric. Reflexes normal.  Psychiatric:        Mood and Affect: Mood normal.        Cognition and Memory: Cognition and memory normal.           Assessment & Plan:   Problem List Items Addressed This Visit       Cardiovascular and Mediastinum   Essential hypertension - Primary    bp in fair control at this time  BP Readings from Last 1 Encounters:  12/05/21 124/82  No changes needed Most recent labs reviewed  Disc lifstyle change with low sodium diet and exercise  Metoprolol xl 25 mg daily  Lasix 20 mg bid          Respiratory   Acute sinusitis    Several weeks of sinus pain and congestion  Now some  green mucous   Disc sympt care  Continue steroid ns zpak sent to pharmacy  Update if not starting to improve in a week or if worsening        Relevant Medications   azithromycin (ZITHROMAX Z-PAK) 250 MG tablet     Digestive   GASTROESOPHAGEAL REFLUX DISEASE    Esomeprazole daily Enc to watch diet        Relevant Medications   meclizine (ANTIVERT) 25 MG tablet     Other   Depression with anxiety    Fairly stable PHQ score of 3 Plan to continue wellbutrin xl 150 mg daily       Relevant Medications   ALPRAZolam (XANAX) 0.25 MG tablet   HYPERCHOLESTEROLEMIA, PURE    Disc goals for lipids and reasons to control them Rev last labs with pt Rev low sat fat diet in detail LDL is 87 Simvastatin 10 mg daily -tolerating well  Last calcium score was 0 Very good diet       Obesity    Discussed how this problem influences overall health and the risks it imposes  Reviewed plan for weight loss with lower calorie diet (via better food choices and also portion control or program like weight watchers) and exercise building up to or more than 30 minutes 5 days per week including some aerobic activity         Prediabetes    Lab Results  Component Value Date   HGBA1C 6.5 12/04/2021  This is up from 6.2 disc imp of low glycemic diet and wt loss to prevent DM2  She plans to work on this F/u  3 mo  May consider medication if needed

## 2021-12-05 NOTE — Assessment & Plan Note (Signed)
Discussed how this problem influences overall health and the risks it imposes  Reviewed plan for weight loss with lower calorie diet (via better food choices and also portion control or program like weight watchers) and exercise building up to or more than 30 minutes 5 days per week including some aerobic activity    

## 2021-12-05 NOTE — Assessment & Plan Note (Signed)
Disc goals for lipids and reasons to control them Rev last labs with pt Rev low sat fat diet in detail LDL is 87 Simvastatin 10 mg daily -tolerating well  Last calcium score was 0 Very good diet

## 2021-12-05 NOTE — Assessment & Plan Note (Signed)
Esomeprazole daily Enc to watch diet

## 2021-12-05 NOTE — Assessment & Plan Note (Signed)
Fairly stable PHQ score of 3 Plan to continue wellbutrin xl 150 mg daily

## 2021-12-05 NOTE — Assessment & Plan Note (Signed)
bp in fair control at this time  BP Readings from Last 1 Encounters:  12/05/21 124/82   No changes needed Most recent labs reviewed  Disc lifstyle change with low sodium diet and exercise  Metoprolol xl 25 mg daily  Lasix 20 mg bid

## 2021-12-05 NOTE — Patient Instructions (Addendum)
If you are interested in the new shingles vaccine (Shingrix) - call your local pharmacy to check on coverage and availability  If affordable, get on a wait list at your pharmacy to get the vaccine.  Send [place] a note when you are ready for a colonoscopy referral  Your cologuard was 01/13/20 and that is good for 3 years  To prevent diabetes  Try to get most of your carbohydrates from produce (with the exception of white potatoes)  Eat less bread/pasta/rice/snack foods/cereals/sweets and other items from the middle of the grocery store (processed carbs)  Follow up in 3 months   Take the zpak for sinus infection  Update if not starting to improve in a week or if worsening

## 2021-12-05 NOTE — Assessment & Plan Note (Signed)
Lab Results  Component Value Date   HGBA1C 6.5 12/04/2021   This is up from 6.2 disc imp of low glycemic diet and wt loss to prevent DM2  She plans to work on this F/u 3 mo  May consider medication if needed

## 2021-12-05 NOTE — Assessment & Plan Note (Signed)
Several weeks of sinus pain and congestion  Now some green mucous   Disc sympt care  Continue steroid ns zpak sent to pharmacy  Update if not starting to improve in a week or if worsening

## 2021-12-06 ENCOUNTER — Encounter: Payer: Self-pay | Admitting: Family Medicine

## 2021-12-06 MED ORDER — NIRMATRELVIR/RITONAVIR (PAXLOVID)TABLET: 3.0000 | ORAL_TABLET | Freq: Two times a day (BID) | ORAL | 0 refills | Status: AC

## 2021-12-06 NOTE — Telephone Encounter (Signed)
Patient called in and her at home test came up positive. Delsa Sale states she is at risk, and would like to know the next steps.

## 2021-12-07 ENCOUNTER — Encounter: Payer: Self-pay | Admitting: Family Medicine

## 2022-01-24 LAB — HM PAP SMEAR: HM Pap smear: NEGATIVE

## 2022-01-31 ENCOUNTER — Encounter: Payer: Self-pay | Admitting: Family Medicine

## 2022-03-22 ENCOUNTER — Encounter: Payer: Self-pay | Admitting: Family Medicine

## 2022-03-22 ENCOUNTER — Ambulatory Visit
Admission: RE | Admit: 2022-03-22 | Discharge: 2022-03-22 | Disposition: A | Discharge status: Home / Self Care | Payer: Medicare Other | Source: Ambulatory Visit | Attending: Obstetrics and Gynecology | Admitting: Obstetrics and Gynecology

## 2022-03-22 DIAGNOSIS — Z803 Family history of malignant neoplasm of breast: Secondary | ICD-10-CM

## 2022-03-22 MED ORDER — GADOPICLENOL 0.5 MMOL/ML IV SOLN
8.0000 mL | Freq: Once | INTRAVENOUS | Status: AC | PRN
  Administered 2022-03-22: 8 mL via INTRAVENOUS

## 2022-03-25 ENCOUNTER — Other Ambulatory Visit: Payer: Self-pay | Admitting: Obstetrics and Gynecology

## 2022-03-25 DIAGNOSIS — R928 Other abnormal and inconclusive findings on diagnostic imaging of breast: Secondary | ICD-10-CM

## 2022-03-28 ENCOUNTER — Ambulatory Visit
Admission: RE | Admit: 2022-03-28 | Discharge: 2022-03-28 | Disposition: A | Discharge status: Home / Self Care | Payer: Medicare Other | Source: Ambulatory Visit | Attending: Obstetrics and Gynecology | Admitting: Obstetrics and Gynecology

## 2022-03-28 DIAGNOSIS — R928 Other abnormal and inconclusive findings on diagnostic imaging of breast: Secondary | ICD-10-CM

## 2022-03-28 MED ORDER — GADOPICLENOL 0.5 MMOL/ML IV SOLN
8.0000 mL | Freq: Once | INTRAVENOUS | Status: AC | PRN
  Administered 2022-03-28: 8 mL via INTRAVENOUS

## 2022-08-14 ENCOUNTER — Telehealth: Payer: Self-pay | Admitting: *Deleted

## 2022-08-14 NOTE — Telephone Encounter (Signed)
   Name: Kristine [name]  DOB: [birth year]  MRN: [id number]  Primary Cardiologist: Dorris Carnes, MD  Chart reviewed as part of pre-operative protocol coverage. Because of Kristine [name]'s past medical history and time since last visit, she will require a follow-up in-office visit in order to better assess preoperative cardiovascular risk.  Pre-op covering staff: - Please schedule appointment and call patient to inform them. If patient already had an upcoming appointment within acceptable timeframe, please add "pre-op clearance" to the appointment notes so provider is aware. - Please contact requesting surgeon's office via preferred method (i.e, phone, fax) to inform them of need for appointment prior to surgery.    Mable Fill, Marissa Nestle, NP  08/14/2022, 7:32 AM

## 2022-08-14 NOTE — Telephone Encounter (Signed)
Spoke with patient who is agreeable to see Ermalinda Barrios, PA-C on 4/9 at 8:45 am.

## 2022-08-14 NOTE — Progress Notes (Signed)
Cardiology Office Note:    Date:  08/20/2022   ID:  Kristine, [name] 06-11-1955, MRN [id number]  PCP:  Judy Pimple, MD  [facility] Providers Cardiologist:  Dietrich Pates, MD     Referring MD: Judy Pimple, MD   Chief Complaint:  Pre-op Exam     History of Present Illness:   Kristine [name] is a 67 y.o. female with history of palpitations on troprol, pulmonary HTN, HLD, normal echo and myoview 2017, Cor Calcium score 0 03/2020.  Recurrent DVT's with chronic LE edema.    Echo 2019  LVEF and RVEF were normal RV and RA normal in sze  PAP 47   IVC dilated,Repeat echo 03/2020 IVC normalized and PAP improved  Patient last saw Dr. Tenny Craw 07/2021 and doing well.   Patient on my schedule for Preop clearance for hysterectomy, D&C, polypectomy 08/22/22 Dr. Marcelle Overlie. Patient is doing great. She walks and goes to the [facility] 3-5 days/week 40-90 min. Denies chest pain, dyspnea. She can feel when her HR goes up with anxiety. No recent PVC's.      Past Medical History:  Diagnosis Date   Anxiety    Basal cell cancer    Basal cell carcinoma    face   Depression    DVT (deep venous thrombosis) 1/08   Esophageal erosions    Factor V Leiden    On coag x 1 year ending in 09   Gastritis 1990   hospital- abd pain   GERD (gastroesophageal reflux disease)    Radial head fracture 2011   Current Medications: Current Meds  Medication Sig   acetaminophen (TYLENOL) 500 MG tablet Take 500 mg by mouth every 6 (six) hours as needed.   acyclovir ointment (ZOVIRAX) 5 % Apply 1 application topically every 3 (three) hours. To cold sore as needed   ALPRAZolam (XANAX) 0.25 MG tablet Take 1 tablet (0.25 mg total) by mouth at bedtime as needed for anxiety.   aspirin 325 MG tablet Take 325 mg by mouth daily.   buPROPion (WELLBUTRIN XL) 150 MG 24 hr tablet TAKE 1 TABLET EVERY DAY   Cetirizine HCl (ZYRTEC PO) Take 1 tablet by mouth daily.   Cholecalciferol (VITAMIN D3 PO) Take 5,000  Units by mouth daily.   ciclopirox (PENLAC) 8 % solution Apply topically daily.   cyclobenzaprine (FLEXERIL) 10 MG tablet Take 0.5 tablets (5 mg total) by mouth 3 (three) times daily as needed for muscle spasms.   ESOMEPRAZOLE MAGNESIUM PO Take 1 tablet by mouth daily.   fluticasone (FLONASE) 50 MCG/ACT nasal spray PLACE 2 SPRAYS INTO THE NOSE DAILY AS NEEDED   furosemide (LASIX) 20 MG tablet Take 1 tablet (20 mg total) by mouth 2 (two) times a week. AND AS NEEDED   loratadine (CLARITIN) 10 MG tablet Take 10 mg by mouth as needed for allergies. Reported on 05/19/2015   meclizine (ANTIVERT) 25 MG tablet Take 1 tablet (25 mg total) by mouth as needed for dizziness.   metoprolol succinate (TOPROL-XL) 25 MG 24 hr tablet Take 1 tablet (25 mg total) by mouth daily.   potassium chloride (KLOR-CON) 10 MEQ tablet Take 1 tablet (10 mEq total) by mouth daily. AND AS NEEDED WITH YOUR LASIX   Probiotic Product (PROBIOTIC PO) Take 1 tablet by mouth daily.   simvastatin (ZOCOR) 10 MG tablet Take 1 tablet (10 mg total) by mouth at bedtime.   valACYclovir (VALTREX) 1000 MG tablet Take 2 tablets (2,000 mg total) by  mouth 2 (two) times daily. For one day for cold sore (Patient taking differently: Take 2,000 mg by mouth as needed. For one day for cold sore)    Allergies:   Oxycodone-acetaminophen, Paroxetine, Paroxetine hcl, Sulfa antibiotics, Amoxicillin-pot clavulanate, Amoxicillin-pot clavulanate, Calcium, Erythromycin, Guaifenesin, Naproxen, Oxycodone-acetaminophen, Oxycodone-acetaminophen, Oxycodone-aspirin, Oxycodone-aspirin, Sertraline hcl, Sertraline hcl, and Sertraline   Social History   Tobacco Use   Smoking status: Never   Smokeless tobacco: Never  Vaping Use   Vaping Use: Never used  Substance Use Topics   Alcohol use: Yes    Alcohol/week: 0.0 standard drinks of alcohol    Comment: occ   Drug use: No    Family Hx: The patient's family history includes Atrial fibrillation in her brother; Breast  cancer in her sister; Coronary artery disease in her brother; Depression in her mother; Diabetes in her brother; Luiz BlareGraves' disease in her mother; Heart attack in her brother and father; Hypertension in her brother and sister; Osteoporosis in her mother. There is no history of Colon cancer.  ROS     Physical Exam:    VS:  BP 122/80   Pulse 67   Ht 5' 4.5" (1.638 m)   Wt 199 lb 9.6 oz (90.5 kg)   SpO2 97%   BMI 33.73 kg/m     Wt Readings from Last 3 Encounters:  08/20/22 199 lb 9.6 oz (90.5 kg)  12/05/21 201 lb (91.2 kg)  10/18/21 202 lb (91.6 kg)    Physical Exam  GEN: Well nourished, well developed, in no acute distress  Neck: no JVD, carotid bruits, or masses Cardiac:RRR; no murmurs, rubs, or gallops  Respiratory:  clear to auscultation bilaterally, normal work of breathing GI: soft, nontender, nondistended, + BS Ext: without cyanosis, clubbing, or edema, Good distal pulses bilaterally Neuro:  Alert and Oriented x 3,  Psych: euthymic mood, full affect        EKGs/Labs/Other Test Reviewed:    EKG:  EKG is  ordered today.  The ekg ordered today demonstrates NSR normal EKG  Recent Labs: 12/04/2021: ALT 15; BUN 12; Creatinine, Ser 0.72; Hemoglobin 13.7; Platelets 187.0; Potassium 4.6; Sodium 137; TSH 2.34   Recent Lipid Panel Recent Labs    12/04/21 0832  CHOL 167  TRIG 88.0  HDL 62.90  VLDL 17.6  LDLCALC 87     Prior CV Studies:   Ca score CT  (04/04/20)   Ca score 0   Scattered calcifications of aortic root     Echo  03/2020   1. Left ventricular ejection fraction by 3D volume is 67 %. The left ventricle has normal function. The left ventricle has no regional wall motion abnormalities. Left ventricular diastolic parameters were normal. The average left ventricular global longitudinal strain is -24.3 %. The global longitudinal strain is normal. 2. Right ventricular systolic function is normal. The right ventricular size is normal. There is normal pulmonary  artery systolic pressure. The estimated right ventricular systolic pressure is 33.0 mmHg. 3. Left atrial size was borderline dilated. 4. The mitral valve is normal in structure. Trivial mitral valve regurgitation. No evidence of mitral stenosis. 5. The aortic valve is tricuspid. Aortic valve regurgitation is not visualized. No aortic stenosis is present. 6. The inferior vena cava is normal in size with greater than 50% respiratory variability, suggesting right atrial pressure of 3 mmHg.   Risk Assessment/Calculations/Metrics:              ASSESSMENT & PLAN:   No problem-specific Assessment & Plan notes  found for this encounter.   Preop clearance for hysterectomy, D&C, polypectomy 08/22/22 Dr. Marcelle Overlie. Patient doing well without cardiac complaints. Coronary calcium score 0 2021, last echo normal LVEF/RV and pulm pressures. Patient is reasonable risk to proceed with above surgery without further cardiac testing.  According to the Revised Cardiac Risk Index (RCRI), her Perioperative Risk of Major Cardiac Event is (%): 0.4  Her Functional Capacity in METs is: 7.25 according to the Duke Activity Status Index (DASI).   Palpitations controlled with Toprol  Pulmonary HTN-last echo normal LV/RV and pulm pressures.   HLD on Zocor , LDL 87 11/2021  Coronary calcium score 0 2021  Factor V Leiden def  Hx DVT on full dose ASA.           Dispo:  No follow-ups on file.   Medication Adjustments/Labs and Tests Ordered: Current medicines are reviewed at length with the patient today.  Concerns regarding medicines are outlined above.  Tests Ordered: Orders Placed This Encounter  Procedures   EKG 12-Lead   Medication Changes: No orders of the defined types were placed in this encounter.  Signed, Jacolyn Reedy, PA-C  08/20/2022 9:11 AM    [facility] [address] [place], [place], [place]  [address] Phone: [phone]; Fax: [phone]

## 2022-08-14 NOTE — Telephone Encounter (Signed)
   Pre-operative Risk Assessment    Patient Name: Kristine [name]  DOB: 07-22-1955 MRN: [id number]      Request for Surgical Clearance    Procedure:   HYSTERECTOMY, D&C, POLYPECTOMY  Date of Surgery:  Clearance 08/22/22                                 Surgeon:  Dian Queen, MD Surgeon's Group or Practice Name:  [facility] Phone number:  [phone] Fax number:  [phone]   Type of Clearance Requested:   - Medical  - Pharmacy:  Hold Aspirin NOT LISTED ON CLEARANCE   Type of Anesthesia:   PROPOFOL   Additional requests/questions:    Astrid Divine   08/14/2022, 7:28 AM

## 2022-08-20 ENCOUNTER — Encounter: Payer: Self-pay | Admitting: Physician Assistant

## 2022-08-20 ENCOUNTER — Ambulatory Visit: Payer: Medicare Other | Attending: Physician Assistant | Admitting: Physician Assistant

## 2022-08-20 VITALS — BP 122/80 | HR 67 | Ht 64.5 in | Wt 199.6 lb

## 2022-08-20 DIAGNOSIS — E7849 Other hyperlipidemia: Secondary | ICD-10-CM | POA: Diagnosis present

## 2022-08-20 DIAGNOSIS — I272 Pulmonary hypertension, unspecified: Secondary | ICD-10-CM | POA: Diagnosis not present

## 2022-08-20 DIAGNOSIS — Z86718 Personal history of other venous thrombosis and embolism: Secondary | ICD-10-CM | POA: Diagnosis present

## 2022-08-20 DIAGNOSIS — Z01818 Encounter for other preprocedural examination: Secondary | ICD-10-CM | POA: Diagnosis not present

## 2022-08-20 DIAGNOSIS — D6851 Activated protein C resistance: Secondary | ICD-10-CM | POA: Insufficient documentation

## 2022-08-20 DIAGNOSIS — R002 Palpitations: Secondary | ICD-10-CM | POA: Diagnosis not present

## 2022-08-20 MED ORDER — POTASSIUM CHLORIDE ER 10 MEQ PO TBCR: 10.0000 meq | EXTENDED_RELEASE_TABLET | Freq: Every day | ORAL | 3 refills | Status: DC

## 2022-08-20 MED ORDER — METOPROLOL SUCCINATE ER 25 MG PO TB24: 25.0000 mg | ORAL_TABLET | Freq: Every day | ORAL | 3 refills | Status: DC

## 2022-08-20 MED ORDER — FUROSEMIDE 20 MG PO TABS: 20.0000 mg | ORAL_TABLET | ORAL | 3 refills | Status: DC

## 2022-08-20 NOTE — Patient Instructions (Signed)
Medication Instructions:  Your physician recommends that you continue on your current medications as directed. Please refer to the Current Medication list given to you today.  *If you need a refill on your cardiac medications before your next appointment, please call your pharmacy*   Lab Work: None ordered   If you have labs (blood work) drawn today and your tests are completely normal, you will receive your results only by: MyChart Message (if you have MyChart) OR A paper copy in the mail If you have any lab test that is abnormal or we need to change your treatment, we will call you to review the results.   Testing/Procedures: None ordered    Follow-Up: At [facility], you and your health needs are our priority.  As part of our continuing mission to provide you with exceptional heart care, we have created designated Provider Care Teams.  These Care Teams include your primary Cardiologist (physician) and Advanced Practice Providers (APPs -  Physician Assistants and Nurse Practitioners) who all work together to provide you with the care you need, when you need it.  We recommend signing up for the patient portal called "MyChart".  Sign up information is provided on this After Visit Summary.  MyChart is used to connect with patients for Virtual Visits (Telemedicine).  Patients are able to view lab/test results, encounter notes, upcoming appointments, etc.  Non-urgent messages can be sent to your provider as well.   To learn more about what you can do with MyChart, go to [email or web address].    Your next appointment:   12 month(s)  Provider:   Dietrich Pates, MD     Other Instructions

## 2022-08-22 HISTORY — PX: DILATION AND CURETTAGE OF UTERUS: SHX78

## 2022-08-28 ENCOUNTER — Encounter: Payer: Self-pay | Admitting: Family Medicine

## 2022-08-28 MED ORDER — ACYCLOVIR 5 % EX OINT: 1.0000 | TOPICAL_OINTMENT | CUTANEOUS | 3 refills | Status: DC

## 2022-09-16 ENCOUNTER — Other Ambulatory Visit: Payer: Self-pay | Admitting: Obstetrics and Gynecology

## 2022-09-16 DIAGNOSIS — N6019 Diffuse cystic mastopathy of unspecified breast: Secondary | ICD-10-CM

## 2022-09-16 DIAGNOSIS — R921 Mammographic calcification found on diagnostic imaging of breast: Secondary | ICD-10-CM

## 2022-09-23 ENCOUNTER — Encounter: Payer: Self-pay | Admitting: Family Medicine

## 2022-09-23 ENCOUNTER — Other Ambulatory Visit: Payer: Self-pay | Admitting: Internal Medicine

## 2022-09-23 MED ORDER — BUPROPION HCL ER (XL) 150 MG PO TB24: 150.0000 mg | ORAL_TABLET | Freq: Every day | ORAL | 0 refills | Status: DC

## 2022-10-11 LAB — HM MAMMOGRAPHY

## 2022-10-12 HISTORY — PX: BIOPSY BREAST: PRO8

## 2022-10-21 ENCOUNTER — Ambulatory Visit (INDEPENDENT_AMBULATORY_CARE_PROVIDER_SITE_OTHER): Payer: Medicare Other

## 2022-10-21 VITALS — Ht 65.5 in | Wt 195.0 lb

## 2022-10-21 DIAGNOSIS — Z0000 Encounter for general adult medical examination without abnormal findings: Secondary | ICD-10-CM | POA: Diagnosis not present

## 2022-10-21 NOTE — Progress Notes (Signed)
I connected with  Kristine [name] on 10/21/22 by a audio enabled telemedicine application and verified that I am speaking with the correct person using two identifiers.  Patient Location: Home  Provider Location: Home Office  I discussed the limitations of evaluation and management by telemedicine. The patient expressed understanding and agreed to proceed.  Subjective:   Kristine [name] is a 67 y.o. female who presents for Medicare Annual (Subsequent) preventive examination.  Review of Systems      Cardiac Risk Factors include: advanced age (>60men, >2 women)     Objective:    Today's Vitals   10/21/22 1439  Weight: 195 lb (88.5 kg)  Height: 5' 5.5" (1.664 m)   Body mass index is 31.96 kg/m.     10/21/2022    2:57 PM 10/18/2021    3:35 PM 10/17/2020    3:33 PM  Advanced Directives  Does Patient Have a Medical Advance Directive? Yes No Yes  Type of Estate agent of [place];Living will  Healthcare Power of [place];Living will  Copy of Healthcare Power of Attorney in Chart? No - copy requested  No - copy requested  Would patient like information on creating a medical advance directive?  No - Patient declined     Current Medications (verified) Outpatient Encounter Medications as of 10/21/2022  Medication Sig   acetaminophen (TYLENOL) 500 MG tablet Take 500 mg by mouth every 6 (six) hours as needed.   acyclovir ointment (ZOVIRAX) 5 % Apply 1 Application topically every 3 (three) hours. To cold sore as needed   ALPRAZolam (XANAX) 0.25 MG tablet Take 1 tablet (0.25 mg total) by mouth at bedtime as needed for anxiety.   buPROPion (WELLBUTRIN XL) 150 MG 24 hr tablet Take 1 tablet (150 mg total) by mouth daily.   Cholecalciferol (VITAMIN D3 PO) Take 5,000 Units by mouth daily.   ciclopirox (PENLAC) 8 % solution Apply topically daily.   cyclobenzaprine (FLEXERIL) 10 MG tablet Take 0.5 tablets (5 mg total) by mouth 3 (three) times daily as needed for  muscle spasms.   fluticasone (FLONASE) 50 MCG/ACT nasal spray PLACE 2 SPRAYS INTO THE NOSE DAILY AS NEEDED   furosemide (LASIX) 20 MG tablet Take 1 tablet (20 mg total) by mouth 2 (two) times a week. AND AS NEEDED   loratadine (CLARITIN) 10 MG tablet Take 10 mg by mouth as needed for allergies. Reported on 05/19/2015   meclizine (ANTIVERT) 25 MG tablet Take 1 tablet (25 mg total) by mouth as needed for dizziness.   metoprolol succinate (TOPROL-XL) 25 MG 24 hr tablet Take 1 tablet (25 mg total) by mouth daily.   potassium chloride (KLOR-CON) 10 MEQ tablet Take 1 tablet (10 mEq total) by mouth daily. AND AS NEEDED WITH YOUR LASIX   Probiotic Product (PROBIOTIC PO) Take 1 tablet by mouth daily.   simvastatin (ZOCOR) 10 MG tablet TAKE 1 TABLET BY MOUTH EVERYDAY AT BEDTIME   aspirin 325 MG tablet Take 325 mg by mouth daily.   Cetirizine HCl (ZYRTEC PO) Take 1 tablet by mouth daily. (Patient not taking: Reported on 10/21/2022)   ESOMEPRAZOLE MAGNESIUM PO Take 1 tablet by mouth daily. (Patient not taking: Reported on 10/21/2022)   valACYclovir (VALTREX) 1000 MG tablet Take 2 tablets (2,000 mg total) by mouth 2 (two) times daily. For one day for cold sore (Patient not taking: Reported on 10/21/2022)   No facility-administered encounter medications on file as of 10/21/2022.    Allergies (verified) Oxycodone-acetaminophen, Paroxetine, Paroxetine hcl,  Sulfa antibiotics, Amoxicillin-pot clavulanate, Amoxicillin-pot clavulanate, Calcium, Erythromycin, Guaifenesin, Naproxen, Oxycodone-acetaminophen, Oxycodone-acetaminophen, Oxycodone-aspirin, Oxycodone-aspirin, Sertraline hcl, Sertraline hcl, and Sertraline   History: Past Medical History:  Diagnosis Date   Anxiety    Basal cell cancer    Basal cell carcinoma    face   Depression    DVT (deep venous thrombosis) (HCC) 1/08   Esophageal erosions    Factor V Leiden (HCC)    On coag x 1 year ending in 09   Gastritis 1990   hospital- abd pain   GERD  (gastroesophageal reflux disease)    Radial head fracture 2011   Past Surgical History:  Procedure Laterality Date   BIOPSY BREAST Right 10/2022   CHOLECYSTECTOMY     COLONOSCOPY     DILATION AND CURETTAGE OF UTERUS  08/22/2022   MOHS SURGERY  11/10/2009   basal cell carcinoma near eye   OOPHORECTOMY     SHOULDER SURGERY  04/12/2006   TONSILLECTOMY  05/13/1974   UPPER GASTROINTESTINAL ENDOSCOPY     Family History  Problem Relation Age of Onset   Graves' disease Mother    Depression Mother    Osteoporosis Mother    Heart attack Father        x 3    Breast cancer Sister    Heart attack Brother        x 2   Diabetes Brother    Hypertension Brother    Hypertension Sister    Coronary artery disease Brother        with stents    Atrial fibrillation Brother    Colon cancer Neg Hx    Social History   Socioeconomic History   Marital status: Married    Spouse name: Not on file   Number of children: 1   Years of education: Not on file   Highest education level: Not on file  Occupational History   Occupation: Nurse     Employer: [employer]  Tobacco Use   Smoking status: Never   Smokeless tobacco: Never  Vaping Use   Vaping Use: Never used  Substance and Sexual Activity   Alcohol use: Yes    Alcohol/week: 0.0 standard drinks of alcohol    Comment: occ   Drug use: No   Sexual activity: Not on file  Other Topics Concern   Not on file  Social History Narrative   Some walking for exercise         Hematology - Dr. Renelda Mom - Dr. Yisroel Ramming   [facility]- Dr. Caralee Ates   Social Determinants of Health   Financial Resource Strain: Low Risk  (10/21/2022)   Overall Financial Resource Strain (CARDIA)    Difficulty of Paying Living Expenses: Not hard at all  Food Insecurity: No Food Insecurity (10/21/2022)   Hunger Vital Sign    Worried About Running Out of Food in the Last Year: Never true    Ran Out of Food in the Last Year: Never true   Transportation Needs: No Transportation Needs (10/21/2022)   PRAPARE - Administrator, Civil Service (Medical): No    Lack of Transportation (Non-Medical): No  Physical Activity: Sufficiently Active (10/21/2022)   Exercise Vital Sign    Days of Exercise per Week: 4 days    Minutes of Exercise per Session: 40 min  Stress: Stress Concern Present (10/21/2022)   [employer] of Occupational Health - Occupational Stress Questionnaire    Feeling of Stress : To some  extent  Social Connections: Moderately Integrated (10/21/2022)   Social Connection and Isolation Panel [NHANES]    Frequency of Communication with Friends and Family: More than three times a week    Frequency of Social Gatherings with Friends and Family: More than three times a week    Attends Religious Services: Never    Database administrator or Organizations: Yes    Attends Engineer, structural: More than 4 times per year    Marital Status: Married    Tobacco Counseling Counseling given: Not Answered   Clinical Intake:  Pre-visit preparation completed: Yes  Pain : No/denies pain     Nutritional Risks: None Diabetes: No  How often do you need to have someone help you when you read instructions, pamphlets, or other written materials from your doctor or pharmacy?: 1 - Never  Diabetic? no  Interpreter Needed?: No  Information entered by :: C.Noel Henandez LPN   Activities of Daily Living    10/21/2022    2:59 PM 10/17/2022    5:26 PM  In your present state of health, do you have any difficulty performing the following activities:  Hearing? 0 0  Vision? 0 0  Difficulty concentrating or making decisions? 0 0  Walking or climbing stairs? 0 0  Dressing or bathing? 0 0  Doing errands, shopping? 0 0  Preparing Food and eating ? N N  Using the Toilet? N N  In the past six months, have you accidently leaked urine? Y Y  Comment occasionally if waits to long   Do you have problems with loss of  bowel control? N N  Managing your Medications? N N  Managing your Finances? N N  Housekeeping or managing your Housekeeping? N N    Patient Care Team: Tower, Audrie Gallus, MD as PCP - General Pricilla Riffle, MD as PCP - Cardiology (Cardiology)  Indicate any recent Medical Services you may have received from other than [facility] providers in the past year (date may be approximate).     Assessment:   This is a routine wellness examination for Asani.  Hearing/Vision screen Hearing Screening - Comments:: No hearing issues Vision Screening - Comments:: Glasses - Provider in [place]  Dietary issues and exercise activities discussed: Current Exercise Habits: Home exercise routine, Type of exercise: Other - see comments;strength training/weights (Bike,Elliptical), Time (Minutes): 40, Frequency (Times/Week): 4, Weekly Exercise (Minutes/Week): 160, Intensity: Moderate, Exercise limited by: None identified;Other - see comments   Goals Addressed             This Visit's Progress    Patient Stated       Lose weight       Depression Screen    10/21/2022    2:55 PM 12/05/2021    8:59 AM 10/18/2021    3:33 PM 10/17/2020    3:35 PM 06/08/2019    3:38 PM 04/03/2018    9:56 AM 03/11/2017   12:52 PM  PHQ 2/9 Scores  PHQ - 2 Score 0 0 0 0 2 0 0  PHQ- 9 Score  3 0 0 2      Fall Risk    10/21/2022    2:58 PM 10/17/2022    5:26 PM 10/18/2021    3:36 PM 10/17/2020    3:34 PM  Fall Risk   Falls in the past year? 0 0 0 0  Number falls in past yr: 0 0 0 0  Injury with Fall? 0 0 0 0  Risk for fall  due to : No Fall Risks  No Fall Risks Medication side effect  Follow up Falls prevention discussed;Falls evaluation completed  Falls evaluation completed Falls evaluation completed;Falls prevention discussed    FALL RISK PREVENTION PERTAINING TO THE HOME:  Any stairs in or around the home? Yes  If so, are there any without handrails? No  Home free of loose throw rugs in walkways, pet beds, electrical  cords, etc? Yes  Adequate lighting in your home to reduce risk of falls? Yes   ASSISTIVE DEVICES UTILIZED TO PREVENT FALLS:  Life alert? No  Use of a cane, walker or w/c? No  Grab bars in the bathroom? No  Shower chair or bench in shower? Yes  Elevated toilet seat or a handicapped toilet? Yes    Cognitive Function:    10/17/2020    3:46 PM  MMSE - Mini Mental State Exam  Orientation to time 5  Orientation to Place 5  Registration 3  Recall 3  Language- repeat 1        10/21/2022    3:02 PM 10/18/2021    3:37 PM  6CIT Screen  What Year? 0 points 0 points  What month? 0 points 0 points  What time? 0 points 0 points  Count back from 20 0 points 0 points  Months in reverse 0 points 0 points  Repeat phrase 0 points 0 points  Total Score 0 points 0 points    Immunizations Immunization History  Administered Date(s) Administered   Fluad Quad(high Dose 65+) 03/01/2021   Hepatitis B 05/13/1994   Influenza Split 03/21/2011, 02/03/2013, 02/22/2014   Influenza Whole 02/21/2005, 03/02/2007, 02/09/2009, 03/22/2010   Influenza,inj,Quad PF,6+ Mos 03/29/2015, 03/05/2017, 02/19/2018, 03/02/2019   Influenza-Unspecified 03/28/2015   PFIZER Comirnaty(Gray Top)Covid-19 Tri-Sucrose Vaccine 07/10/2019, 07/30/2019   PFIZER(Purple Top)SARS-COV-2 Vaccination 07/10/2019, 07/30/2019, 02/23/2020   PNEUMOCOCCAL CONJUGATE-20 10/24/2020   Pneumococcal Polysaccharide-23 01/24/2003, 12/07/2007   Pneumococcal-Unspecified 05/13/2013, 10/24/2020   Td 12/07/2007   Tdap 05/13/2013, 04/03/2018   Unspecified SARS-COV-2 Vaccination 07/10/2019, 07/30/2019, 02/22/2020   Zoster, Live 03/07/2016    TDAP status: Up to date  Flu Vaccine status: Up to date  Pneumococcal vaccine status: Up to date  Covid-19 vaccine status: Information provided on how to obtain vaccines.   Qualifies for Shingles Vaccine? Yes   Zostavax completed No   Shingrix Completed?: No.    Education has been provided regarding the  importance of this vaccine. Patient has been advised to call insurance company to determine out of pocket expense if they have not yet received this vaccine. Advised may also receive vaccine at local pharmacy or Health Dept. Verbalized acceptance and understanding.  Screening Tests Health Maintenance  Topic Date Due   Zoster Vaccines- Shingrix (1 of 2) Never done   DEXA SCAN  Never done   COVID-19 Vaccine (12 - 2023-24 season) 05/18/2022   INFLUENZA VACCINE  12/12/2022   Fecal DNA (Cologuard)  01/13/2023   Medicare Annual Wellness (AWV)  10/21/2023   MAMMOGRAM  03/22/2024   DTaP/Tdap/Td (4 - Td or Tdap) 04/03/2028   Pneumonia Vaccine 26+ Years old  Completed   Hepatitis C Screening  Completed   HPV VACCINES  Aged Out   Colonoscopy  Discontinued    Health Maintenance  Health Maintenance Due  Topic Date Due   Zoster Vaccines- Shingrix (1 of 2) Never done   DEXA SCAN  Never done   COVID-19 Vaccine (12 - 2023-24 season) 05/18/2022    Colorectal cancer screening: Type of screening: Cologuard. Completed  01/13/20. Repeat every 3 years Pt declined order at this time.  Mammogram status: Completed 03/22/22. Repeat every year- Pt currently awaiting biopsy results performed 10/16/22.  Bone Scan- Declined at this time.   Lung Cancer Screening: (Low Dose CT Chest recommended if Age 58-80 years, 30 pack-year currently smoking OR have quit w/in 15years.) does not qualify.   Lung Cancer Screening Referral: no  Additional Screening:  Hepatitis C Screening: does qualify; Completed 12/27/15  Vision Screening: Recommended annual ophthalmology exams for early detection of glaucoma and other disorders of the eye. Is the patient up to date with their annual eye exam?  Yes  Who is the provider or what is the name of the office in which the patient attends annual eye exams?  If pt is not established with a provider, would they like to be referred to a provider to establish care? Yes .    Dental Screening: Recommended annual dental exams for proper oral hygiene  Community Resource Referral / Chronic Care Management: CRR required this visit?  No   CCM required this visit?  No      Plan:     I have personally reviewed and noted the following in the patient's chart:   Medical and social history Use of alcohol, tobacco or illicit drugs  Current medications and supplements including opioid prescriptions. Patient is not currently taking opioid prescriptions. Functional ability and status Nutritional status Physical activity Advanced directives List of other physicians Hospitalizations, surgeries, and ER visits in previous 12 months Vitals Screenings to include cognitive, depression, and falls Referrals and appointments  In addition, I have reviewed and discussed with patient certain preventive protocols, quality metrics, and best practice recommendations. A written personalized care plan for preventive services as well as general preventive health recommendations were provided to patient.     Maryan Puls, LPN   [id number]   Nurse Notes: None

## 2022-10-21 NOTE — Patient Instructions (Signed)
Kristine [name] , Thank you for taking time to come for your Medicare Wellness Visit. I appreciate your ongoing commitment to your health goals. Please review the following plan we discussed and let me know if I can assist you in the future.   These are the goals we discussed:  Goals      DIET - EAT MORE FRUITS AND VEGETABLES     Patient Stated     10/17/2020, I will continue to walk 4 days a week for 2 miles.     Patient Stated     Lose weight        This is a list of the screening recommended for you and due dates:  Health Maintenance  Topic Date Due   Zoster (Shingles) Vaccine (1 of 2) Never done   DEXA scan (bone density measurement)  Never done   COVID-19 Vaccine (12 - 2023-24 season) 05/18/2022   Flu Shot  12/12/2022   Cologuard (Stool DNA test)  01/13/2023   Medicare Annual Wellness Visit  10/21/2023   Mammogram  03/22/2024   DTaP/Tdap/Td vaccine (4 - Td or Tdap) 04/03/2028   Pneumonia Vaccine  Completed   Hepatitis C Screening  Completed   HPV Vaccine  Aged Out   Colon Cancer Screening  Discontinued    Advanced directives: Please bring a copy of your health care power of attorney and living will to the office to be added to your chart at your convenience.   Conditions/risks identified: none  Next appointment: Follow up in one year for your annual wellness visit 10/22/23 @ 2pm televisit   Preventive Care 65 Years and Older, Female Preventive care refers to lifestyle choices and visits with your health care provider that can promote health and wellness. What does preventive care include? A yearly physical exam. This is also called an annual well check. Dental exams once or twice a year. Routine eye exams. Ask your health care provider how often you should have your eyes checked. Personal lifestyle choices, including: Daily care of your teeth and gums. Regular physical activity. Eating a healthy diet. Avoiding tobacco and drug use. Limiting alcohol use. Practicing  safe sex. Taking low-dose aspirin every day. Taking vitamin and mineral supplements as recommended by your health care provider. What happens during an annual well check? The services and screenings done by your health care provider during your annual well check will depend on your age, overall health, lifestyle risk factors, and family history of disease. Counseling  Your health care provider may ask you questions about your: Alcohol use. Tobacco use. Drug use. Emotional well-being. Home and relationship well-being. Sexual activity. Eating habits. History of falls. Memory and ability to understand (cognition). Work and work Astronomer. Reproductive health. Screening  You may have the following tests or measurements: Height, weight, and BMI. Blood pressure. Lipid and cholesterol levels. These may be checked every 5 years, or more frequently if you are over 37 years old. Skin check. Lung cancer screening. You may have this screening every year starting at age 38 if you have a 30-pack-year history of smoking and currently smoke or have quit within the past 15 years. Fecal occult blood test (FOBT) of the stool. You may have this test every year starting at age 11. Flexible sigmoidoscopy or colonoscopy. You may have a sigmoidoscopy every 5 years or a colonoscopy every 10 years starting at age 35. Hepatitis C blood test. Hepatitis B blood test. Sexually transmitted disease (STD) testing. Diabetes screening. This is done by  checking your blood sugar (glucose) after you have not eaten for a while (fasting). You may have this done every 1-3 years. Bone density scan. This is done to screen for osteoporosis. You may have this done starting at age 29. Mammogram. This may be done every 1-2 years. Talk to your health care provider about how often you should have regular mammograms. Talk with your health care provider about your test results, treatment options, and if necessary, the need for more  tests. Vaccines  Your health care provider may recommend certain vaccines, such as: Influenza vaccine. This is recommended every year. Tetanus, diphtheria, and acellular pertussis (Tdap, Td) vaccine. You may need a Td booster every 10 years. Zoster vaccine. You may need this after age 67. Pneumococcal 13-valent conjugate (PCV13) vaccine. One dose is recommended after age 5. Pneumococcal polysaccharide (PPSV23) vaccine. One dose is recommended after age 29. Talk to your health care provider about which screenings and vaccines you need and how often you need them. This information is not intended to replace advice given to you by your health care provider. Make sure you discuss any questions you have with your health care provider. Document Released: 05/26/2015 Document Revised: 01/17/2016 Document Reviewed: 02/28/2015 Elsevier Interactive Patient Education  2017 [place] Prevention in the Home Falls can cause injuries. They can happen to people of all ages. There are many things you can do to make your home safe and to help prevent falls. What can I do on the outside of my home? Regularly fix the edges of walkways and driveways and fix any cracks. Remove anything that might make you trip as you walk through a door, such as a raised step or threshold. Trim any bushes or trees on the path to your home. Use bright outdoor lighting. Clear any walking paths of anything that might make someone trip, such as rocks or tools. Regularly check to see if handrails are loose or broken. Make sure that both sides of any steps have handrails. Any raised decks and porches should have guardrails on the edges. Have any leaves, snow, or ice cleared regularly. Use sand or salt on walking paths during winter. Clean up any spills in your garage right away. This includes oil or grease spills. What can I do in the bathroom? Use night lights. Install grab bars by the toilet and in the tub and shower.  Do not use towel bars as grab bars. Use non-skid mats or decals in the tub or shower. If you need to sit down in the shower, use a plastic, non-slip stool. Keep the floor dry. Clean up any water that spills on the floor as soon as it happens. Remove soap buildup in the tub or shower regularly. Attach bath mats securely with double-sided non-slip rug tape. Do not have throw rugs and other things on the floor that can make you trip. What can I do in the bedroom? Use night lights. Make sure that you have a light by your bed that is easy to reach. Do not use any sheets or blankets that are too big for your bed. They should not hang down onto the floor. Have a firm chair that has side arms. You can use this for support while you get dressed. Do not have throw rugs and other things on the floor that can make you trip. What can I do in the kitchen? Clean up any spills right away. Avoid walking on wet floors. Keep items that you use a  lot in easy-to-reach places. If you need to reach something above you, use a strong step stool that has a grab bar. Keep electrical cords out of the way. Do not use floor polish or wax that makes floors slippery. If you must use wax, use non-skid floor wax. Do not have throw rugs and other things on the floor that can make you trip. What can I do with my stairs? Do not leave any items on the stairs. Make sure that there are handrails on both sides of the stairs and use them. Fix handrails that are broken or loose. Make sure that handrails are as long as the stairways. Check any carpeting to make sure that it is firmly attached to the stairs. Fix any carpet that is loose or worn. Avoid having throw rugs at the top or bottom of the stairs. If you do have throw rugs, attach them to the floor with carpet tape. Make sure that you have a light switch at the top of the stairs and the bottom of the stairs. If you do not have them, ask someone to add them for you. What else  can I do to help prevent falls? Wear shoes that: Do not have high heels. Have rubber bottoms. Are comfortable and fit you well. Are closed at the toe. Do not wear sandals. If you use a stepladder: Make sure that it is fully opened. Do not climb a closed stepladder. Make sure that both sides of the stepladder are locked into place. Ask someone to hold it for you, if possible. Clearly mark and make sure that you can see: Any grab bars or handrails. First and last steps. Where the edge of each step is. Use tools that help you move around (mobility aids) if they are needed. These include: Canes. Walkers. Scooters. Crutches. Turn on the lights when you go into a dark area. Replace any light bulbs as soon as they burn out. Set up your furniture so you have a clear path. Avoid moving your furniture around. If any of your floors are uneven, fix them. If there are any pets around you, be aware of where they are. Review your medicines with your doctor. Some medicines can make you feel dizzy. This can increase your chance of falling. Ask your doctor what other things that you can do to help prevent falls. This information is not intended to replace advice given to you by your health care provider. Make sure you discuss any questions you have with your health care provider. Document Released: 02/23/2009 Document Revised: 10/05/2015 Document Reviewed: 06/03/2014 Elsevier Interactive Patient Education  2017 [employer].

## 2022-10-28 ENCOUNTER — Encounter: Payer: Self-pay | Admitting: Internal Medicine

## 2022-11-19 ENCOUNTER — Ambulatory Visit (AMBULATORY_SURGERY_CENTER): Payer: Medicare Other

## 2022-11-19 VITALS — Ht 64.5 in | Wt 195.0 lb

## 2022-11-19 DIAGNOSIS — Z1211 Encounter for screening for malignant neoplasm of colon: Secondary | ICD-10-CM

## 2022-11-19 NOTE — Progress Notes (Signed)

## 2022-11-21 ENCOUNTER — Other Ambulatory Visit: Payer: Medicare Other

## 2022-12-03 ENCOUNTER — Telehealth: Payer: Self-pay | Admitting: Family Medicine

## 2022-12-03 DIAGNOSIS — R7303 Prediabetes: Secondary | ICD-10-CM

## 2022-12-03 DIAGNOSIS — I10 Essential (primary) hypertension: Secondary | ICD-10-CM

## 2022-12-03 DIAGNOSIS — E7800 Pure hypercholesterolemia, unspecified: Secondary | ICD-10-CM

## 2022-12-03 NOTE — Telephone Encounter (Signed)
-----   Message from Lovena Neighbours sent at 11/19/2022  1:45 PM EDT ----- Regarding: Labs for 7.24.24 Please put physical lab orders in future. Thank you, Denny Peon

## 2022-12-04 ENCOUNTER — Telehealth: Payer: Self-pay | Admitting: Internal Medicine

## 2022-12-04 ENCOUNTER — Other Ambulatory Visit (INDEPENDENT_AMBULATORY_CARE_PROVIDER_SITE_OTHER): Payer: Medicare Other

## 2022-12-04 ENCOUNTER — Other Ambulatory Visit: Payer: Self-pay | Admitting: Family Medicine

## 2022-12-04 ENCOUNTER — Telehealth: Payer: Self-pay

## 2022-12-04 DIAGNOSIS — I10 Essential (primary) hypertension: Secondary | ICD-10-CM | POA: Diagnosis not present

## 2022-12-04 DIAGNOSIS — R7303 Prediabetes: Secondary | ICD-10-CM | POA: Diagnosis not present

## 2022-12-04 DIAGNOSIS — E7800 Pure hypercholesterolemia, unspecified: Secondary | ICD-10-CM | POA: Diagnosis not present

## 2022-12-04 LAB — CBC WITH DIFFERENTIAL/PLATELET
Basophils Absolute: 0 10*3/uL (ref 0.0–0.1)
Basophils Relative: 0.8 % (ref 0.0–3.0)
Eosinophils Absolute: 0.3 10*3/uL (ref 0.0–0.7)
Eosinophils Relative: 4.4 % (ref 0.0–5.0)
HCT: 40.7 % (ref 36.0–46.0)
Hemoglobin: 13.1 g/dL (ref 12.0–15.0)
Lymphocytes Relative: 33.4 % (ref 12.0–46.0)
Lymphs Abs: 2 10*3/uL (ref 0.7–4.0)
MCHC: 32.3 g/dL (ref 30.0–36.0)
MCV: 87.4 fl (ref 78.0–100.0)
Monocytes Absolute: 0.5 10*3/uL (ref 0.1–1.0)
Monocytes Relative: 8.3 % (ref 3.0–12.0)
Neutro Abs: 3.1 10*3/uL (ref 1.4–7.7)
Neutrophils Relative %: 53.1 % (ref 43.0–77.0)
Platelets: 172 10*3/uL (ref 150.0–400.0)
RBC: 4.66 Mil/uL (ref 3.87–5.11)
RDW: 13.9 % (ref 11.5–15.5)
WBC: 5.9 10*3/uL (ref 4.0–10.5)

## 2022-12-04 LAB — COMPREHENSIVE METABOLIC PANEL
ALT: 12 U/L (ref 0–35)
AST: 13 U/L (ref 0–37)
Albumin: 3.9 g/dL (ref 3.5–5.2)
Alkaline Phosphatase: 60 U/L (ref 39–117)
BUN: 18 mg/dL (ref 6–23)
CO2: 28 mEq/L (ref 19–32)
Calcium: 9.1 mg/dL (ref 8.4–10.5)
Chloride: 104 mEq/L (ref 96–112)
Creatinine, Ser: 0.71 mg/dL (ref 0.40–1.20)
GFR: 87.45 mL/min (ref >60.00)
Glucose, Bld: 141 mg/dL — ABNORMAL HIGH (ref 70–99)
Potassium: 4.4 mEq/L (ref 3.5–5.1)
Sodium: 139 mEq/L (ref 135–145)
Total Bilirubin: 0.6 mg/dL (ref 0.2–1.2)
Total Protein: 5.9 g/dL — ABNORMAL LOW (ref 6.0–8.3)

## 2022-12-04 LAB — LIPID PANEL
Cholesterol: 156 mg/dL (ref 0–200)
HDL: 58.6 mg/dL (ref >39.00)
LDL Cholesterol: 80 mg/dL (ref 0–99)
NonHDL: 97.87
Total CHOL/HDL Ratio: 3
Triglycerides: 87 mg/dL (ref 0.0–149.0)
VLDL: 17.4 mg/dL (ref 0.0–40.0)

## 2022-12-04 LAB — HEMOGLOBIN A1C: Hgb A1c MFr Bld: 6.6 % — ABNORMAL HIGH (ref 4.6–6.5)

## 2022-12-04 LAB — TSH: TSH: 2.62 u[IU]/mL (ref 0.35–5.50)

## 2022-12-04 MED ORDER — NA SULFATE-K SULFATE-MG SULF 17.5-3.13-1.6 GM/177ML PO SOLN: 1.0000 | Freq: Once | ORAL | 0 refills | Status: AC

## 2022-12-04 NOTE — Telephone Encounter (Signed)
Returned patients call and resent suprep to [facility]

## 2022-12-04 NOTE — Telephone Encounter (Signed)
Patient stated that prep was not at pharmacy.  Re sent suprep to [facility] in [facility]

## 2022-12-04 NOTE — Telephone Encounter (Signed)
Patient called states she went to pick up her prep medication but it was not sent. Please call patient once done so she knows per patients request. Her procedure is scheduled for 12/06/22.

## 2022-12-04 NOTE — Addendum Note (Signed)
Addended by: Jaquelyn Bitter on: 12/04/2022 11:50 AM   Modules accepted: Orders

## 2022-12-06 ENCOUNTER — Encounter: Payer: Self-pay | Admitting: Internal Medicine

## 2022-12-06 ENCOUNTER — Ambulatory Visit (AMBULATORY_SURGERY_CENTER): Payer: Medicare Other | Admitting: Internal Medicine

## 2022-12-06 VITALS — BP 130/66 | HR 55 | Temp 98.6°F | Resp 13 | Ht 64.5 in | Wt 195.0 lb

## 2022-12-06 DIAGNOSIS — D128 Benign neoplasm of rectum: Secondary | ICD-10-CM | POA: Diagnosis not present

## 2022-12-06 DIAGNOSIS — Z860101 Personal history of adenomatous and serrated colon polyps: Secondary | ICD-10-CM

## 2022-12-06 DIAGNOSIS — Z86010 Personal history of colonic polyps: Secondary | ICD-10-CM

## 2022-12-06 DIAGNOSIS — Z1211 Encounter for screening for malignant neoplasm of colon: Secondary | ICD-10-CM

## 2022-12-06 HISTORY — DX: Personal history of adenomatous and serrated colon polyps: Z86.0101

## 2022-12-06 MED ORDER — SODIUM CHLORIDE 0.9 % IV SOLN: 500.0000 mL | Freq: Once | INTRAVENOUS | Status: DC

## 2022-12-06 NOTE — Patient Instructions (Addendum)
I found and removed one small polyp from the rectum.  All else is ok.  I will let you know pathology results and when to have another routine colonoscopy by mail and/or My Chart.  I appreciate the opportunity to care for you. Iva Boop, MD, [facility]  Resume all of your previous medications today.  Read the handouts given to you by your recovery room nurse today.  YOU HAD AN ENDOSCOPIC PROCEDURE TODAY AT THE [facility]:   Refer to the procedure report that was given to you for any specific questions about what was found during the examination.  If the procedure report does not answer your questions, please call your gastroenterologist to clarify.  If you requested that your care partner not be given the details of your procedure findings, then the procedure report has been included in a sealed envelope for you to review at your convenience later.  YOU SHOULD EXPECT: Some feelings of bloating in the abdomen. Passage of more gas than usual.  Walking can help get rid of the air that was put into your GI tract during the procedure and reduce the bloating. If you had a lower endoscopy (such as a colonoscopy or flexible sigmoidoscopy) you may notice spotting of blood in your stool or on the toilet paper. If you underwent a bowel prep for your procedure, you may not have a normal bowel movement for a few days.  Please Note:  You might notice some irritation and congestion in your nose or some drainage.  This is from the oxygen used during your procedure.  There is no need for concern and it should clear up in a day or so.  SYMPTOMS TO REPORT IMMEDIATELY:  Following lower endoscopy (colonoscopy or flexible sigmoidoscopy):  Excessive amounts of blood in the stool  Significant tenderness or worsening of abdominal pains  Swelling of the abdomen that is new, acute  Fever of 100F or higher  For urgent or emergent issues, a gastroenterologist can be reached at any hour by calling [phone]  [phone]. Do not use MyChart messaging for urgent concerns.    DIET:  We do recommend a small meal at first, but then you may proceed to your regular diet.  Drink plenty of fluids but you should avoid alcoholic beverages for 24 hours.  ACTIVITY:  You should plan to take it easy for the rest of today and you should NOT DRIVE or use heavy machinery until tomorrow (because of the sedation medicines used during the test).    FOLLOW UP: Our staff will call the number listed on your records the next business day following your procedure.  We will call around 7:15- 8:00 am to check on you and address any questions or concerns that you may have regarding the information given to you following your procedure. If we do not reach you, we will leave a message.     If any biopsies were taken you will be contacted by phone or by letter within the next 1-3 weeks.  Please call us at [phone] if you have not heard about the biopsies in 3 weeks.    SIGNATURES/CONFIDENTIALITY: You and/or your care partner have signed paperwork which will be entered into your electronic medical record.  These signatures attest to the fact that that the information above on your After Visit Summary has been reviewed and is understood.  Full responsibility of the confidentiality of this discharge information lies with you and/or your care-partner.

## 2022-12-06 NOTE — Op Note (Signed)
[facility] Patient Name: Kristine [name] Procedure Date: 12/06/2022 2:08 PM MRN: [id number] Endoscopist: Iva Boop , MD, [id number] Age: 67 Referring MD:  Date of Birth: 10-14-1955 Gender: Female Account #: [id number] Procedure:                Colonoscopy Indications:              Screening for colorectal malignant neoplasm, Last                            colonoscopy: 2013 Medicines:                Monitored Anesthesia Care Procedure:                Pre-Anesthesia Assessment:                           - Prior to the procedure, a History and Physical                            was performed, and patient medications and                            allergies were reviewed. The patient's tolerance of                            previous anesthesia was also reviewed. The risks                            and benefits of the procedure and the sedation                            options and risks were discussed with the patient.                            All questions were answered, and informed consent                            was obtained. Prior Anticoagulants: The patient has                            taken no anticoagulant or antiplatelet agents. ASA                            Grade Assessment: II - A patient with mild systemic                            disease. After reviewing the risks and benefits,                            the patient was deemed in satisfactory condition to                            undergo the procedure.  After obtaining informed consent, the colonoscope                            was passed under direct vision. Throughout the                            procedure, the patient's blood pressure, pulse, and                            oxygen saturations were monitored continuously. The                            CF [id number] #[id number] was introduced through the anus                            and advanced to the the cecum,  identified by                            appendiceal orifice and ileocecal valve. The                            colonoscopy was performed without difficulty. The                            patient tolerated the procedure well. The quality                            of the bowel preparation was excellent. The                            ileocecal valve, appendiceal orifice, and rectum                            were photographed. The bowel preparation used was                            SUPREP via split dose instruction. Scope In: 2:36:13 PM Scope Out: 2:45:31 PM Scope Withdrawal Time: 0 hours 7 minutes 24 seconds  Total Procedure Duration: 0 hours 9 minutes 18 seconds  Findings:                 The perianal and digital rectal examinations were                            normal.                           A 5 mm polyp was found in the rectum. The polyp was                            sessile. The polyp was removed with a cold snare.                            Resection and retrieval were complete. Verification  of patient identification for the specimen was                            done. Estimated blood loss was minimal.                           The exam was otherwise without abnormality on                            direct and retroflexion views. Complications:            No immediate complications. Estimated Blood Loss:     Estimated blood loss was minimal. Impression:               - One 5 mm polyp in the rectum, removed with a cold                            snare. Resected and retrieved.                           - The examination was otherwise normal on direct                            and retroflexion views. Recommendation:           - Patient has a contact number available for                            emergencies. The signs and symptoms of potential                            delayed complications were discussed with the                             patient. Return to normal activities tomorrow.                            Written discharge instructions were provided to the                            patient.                           - Resume previous diet.                           - Continue present medications.                           - Repeat colonoscopy is recommended. The                            colonoscopy date will be determined after pathology                            results from today's exam become available for  review. Iva Boop, MD 12/06/2022 2:52:57 PM This report has been signed electronically.

## 2022-12-06 NOTE — Progress Notes (Signed)
Called to room to assist during endoscopic procedure.  Patient ID and intended procedure confirmed with present staff. Received instructions for my participation in the procedure from the performing physician.  

## 2022-12-06 NOTE — Progress Notes (Signed)
VS completed by CW.   Pt's states no medical or surgical changes since previsit or office visit.  

## 2022-12-06 NOTE — Progress Notes (Signed)
Sedate, gd SR, tolerated procedure well, VSS, report to RN 

## 2022-12-06 NOTE — Progress Notes (Signed)
[facility] History and Physical   Primary Care Physician:  Tower, Audrie Gallus, MD   Reason for Procedure:   CRCA screening  Plan:    colonoscopy     HPI: Kristine [name] is a 67 y.o. female for repeat screening colonoscopy  Negative exam 05/2011   Past Medical History:  Diagnosis Date   Allergy    Anxiety    Basal cell cancer    Basal cell carcinoma    face   Clotting disorder (HCC)    Depression    DVT (deep venous thrombosis) (HCC) 05/13/2006   Esophageal erosions    Factor V Leiden (HCC)    On coag x 1 year ending in 09   Gastritis 05/13/1988   hospital- abd pain   GERD (gastroesophageal reflux disease)    Hyperlipidemia    Hypertension    Radial head fracture 05/13/2009    Past Surgical History:  Procedure Laterality Date   BIOPSY BREAST Right 10/2022   CHOLECYSTECTOMY     COLONOSCOPY     DILATION AND CURETTAGE OF UTERUS  08/22/2022   MOHS SURGERY  11/10/2009   basal cell carcinoma near eye   OOPHORECTOMY     SHOULDER SURGERY  04/12/2006   TONSILLECTOMY  05/13/1974   UPPER GASTROINTESTINAL ENDOSCOPY      Prior to Admission medications   Medication Sig Start Date End Date Taking? Authorizing Provider  acetaminophen (TYLENOL) 500 MG tablet Take 500 mg by mouth every 6 (six) hours as needed.    [provider]  acyclovir ointment (ZOVIRAX) 5 % Apply 1 Application topically every 3 (three) hours. To cold sore as needed Patient not taking: Reported on 11/19/2022 08/28/22   Tower, Audrie Gallus, MD  ALPRAZolam Prudy Feeler) 0.25 MG tablet Take 1 tablet (0.25 mg total) by mouth at bedtime as needed for anxiety. Patient not taking: Reported on 11/19/2022 12/05/21   Tower, Audrie Gallus, MD  aspirin 325 MG tablet Take 325 mg by mouth daily.    [provider]  buPROPion (WELLBUTRIN XL) 150 MG 24 hr tablet TAKE 1 TABLET BY MOUTH EVERY DAY 12/05/22   Tower, Audrie Gallus, MD  Cetirizine HCl (ZYRTEC PO) Take 1 tablet by mouth daily. 03/13/20   [provider]  Cholecalciferol (VITAMIN D3 PO) Take 5,000 Units by mouth daily.    [provider]  ciclopirox (PENLAC) 8 % solution Apply topically daily. 05/08/21   [provider]  cyclobenzaprine (FLEXERIL) 10 MG tablet Take 0.5 tablets (5 mg total) by mouth 3 (three) times daily as needed for muscle spasms. 12/05/21   Tower, Audrie Gallus, MD  ESOMEPRAZOLE MAGNESIUM PO Take 1 tablet by mouth daily. Patient not taking: Reported on 11/19/2022 02/11/20   [provider]  fluticasone (FLONASE) 50 MCG/ACT nasal spray PLACE 2 SPRAYS INTO THE NOSE DAILY AS NEEDED 04/03/18   Tower, Audrie Gallus, MD  furosemide (LASIX) 20 MG tablet Take 1 tablet (20 mg total) by mouth 2 (two) times a week. AND AS NEEDED 08/22/22   Dyann Kief, PA-C  loratadine (CLARITIN) 10 MG tablet Take 10 mg by mouth as needed for allergies. Reported on 05/19/2015 Patient not taking: Reported on 11/19/2022    [provider]  meclizine (ANTIVERT) 25 MG tablet Take 1 tablet (25 mg total) by mouth as needed for dizziness. 12/05/21   Tower, Audrie Gallus, MD  metoprolol succinate (TOPROL-XL) 25 MG 24 hr tablet Take 1 tablet (25 mg total) by mouth daily. 08/20/22   Geni Bers,  Tarri Abernethy, PA-C  potassium chloride (KLOR-CON) 10 MEQ tablet Take 1 tablet (10 mEq total) by mouth daily. AND AS NEEDED WITH YOUR LASIX 08/20/22   Dyann Kief, PA-C  Probiotic Product (PROBIOTIC PO) Take 1 tablet by mouth daily.    [provider]  simvastatin (ZOCOR) 10 MG tablet TAKE 1 TABLET BY MOUTH EVERYDAY AT BEDTIME 09/23/22   Pricilla Riffle, MD  valACYclovir (VALTREX) 1000 MG tablet Take 2 tablets (2,000 mg total) by mouth 2 (two) times daily. For one day for cold sore 08/05/18   Tower, Audrie Gallus, MD    Current Outpatient Medications  Medication Sig Dispense Refill   acetaminophen (TYLENOL) 500 MG tablet Take 500 mg by mouth every 6 (six) hours as needed.     acyclovir ointment (ZOVIRAX) 5 % Apply 1 Application topically every 3 (three)  hours. To cold sore as needed (Patient not taking: Reported on 11/19/2022) 5 g 3   ALPRAZolam (XANAX) 0.25 MG tablet Take 1 tablet (0.25 mg total) by mouth at bedtime as needed for anxiety. (Patient not taking: Reported on 11/19/2022) 30 tablet 0   aspirin 325 MG tablet Take 325 mg by mouth daily.     buPROPion (WELLBUTRIN XL) 150 MG 24 hr tablet TAKE 1 TABLET BY MOUTH EVERY DAY 90 tablet 0   Cetirizine HCl (ZYRTEC PO) Take 1 tablet by mouth daily.     Cholecalciferol (VITAMIN D3 PO) Take 5,000 Units by mouth daily.     ciclopirox (PENLAC) 8 % solution Apply topically daily.     cyclobenzaprine (FLEXERIL) 10 MG tablet Take 0.5 tablets (5 mg total) by mouth 3 (three) times daily as needed for muscle spasms. 30 tablet 1   ESOMEPRAZOLE MAGNESIUM PO Take 1 tablet by mouth daily. (Patient not taking: Reported on 11/19/2022)     fluticasone (FLONASE) 50 MCG/ACT nasal spray PLACE 2 SPRAYS INTO THE NOSE DAILY AS NEEDED 48 g 3   furosemide (LASIX) 20 MG tablet Take 1 tablet (20 mg total) by mouth 2 (two) times a week. AND AS NEEDED 90 tablet 3   loratadine (CLARITIN) 10 MG tablet Take 10 mg by mouth as needed for allergies. Reported on 05/19/2015 (Patient not taking: Reported on 11/19/2022)     meclizine (ANTIVERT) 25 MG tablet Take 1 tablet (25 mg total) by mouth as needed for dizziness. 30 tablet 1   metoprolol succinate (TOPROL-XL) 25 MG 24 hr tablet Take 1 tablet (25 mg total) by mouth daily. 90 tablet 3   potassium chloride (KLOR-CON) 10 MEQ tablet Take 1 tablet (10 mEq total) by mouth daily. AND AS NEEDED WITH YOUR LASIX 90 tablet 3   Probiotic Product (PROBIOTIC PO) Take 1 tablet by mouth daily.     simvastatin (ZOCOR) 10 MG tablet TAKE 1 TABLET BY MOUTH EVERYDAY AT BEDTIME 90 tablet 2   valACYclovir (VALTREX) 1000 MG tablet Take 2 tablets (2,000 mg total) by mouth 2 (two) times daily. For one day for cold sore 12 tablet 1   Current Facility-Administered Medications  Medication Dose Route Frequency Provider  Last Rate Last Admin   0.9 %  sodium chloride infusion  500 mL Intravenous Once Iva Boop, MD        Allergies as of 12/06/2022 - Review Complete 11/19/2022  Allergen Reaction Noted   Oxycodone-acetaminophen Rash and Other (See Comments) 11/25/2006   Paroxetine Hives 09/29/2019   Paroxetine hcl Hives 04/05/2019   Sulfa antibiotics Other (See Comments) 09/29/2019   Amoxicillin-pot clavulanate  12/25/2006   Amoxicillin-pot clavulanate Other (See Comments) 12/25/2006   Calcium  11/25/2006   Erythromycin Other (See Comments) 12/25/2006   Guaifenesin  11/25/2006   Naproxen  11/25/2006   Oxycodone-acetaminophen  11/25/2006   Oxycodone-acetaminophen Other (See Comments) 06/19/2015   Oxycodone-aspirin  11/25/2006   Oxycodone-aspirin  11/25/2006   Sertraline hcl  11/25/2006   Sertraline hcl  04/05/2019   Sertraline Rash and Other (See Comments) 11/25/2006    Family History  Problem Relation Age of Onset   Graves' disease Mother    Depression Mother    Osteoporosis Mother    Heart attack Father        x 3    Breast cancer Sister    Hypertension Sister    Colon polyps Brother    Heart attack Brother        x 2   Diabetes Brother    Hypertension Brother    Coronary artery disease Brother        with stents    Atrial fibrillation Brother    Colon cancer Neg Hx    Esophageal cancer Neg Hx    Rectal cancer Neg Hx    Stomach cancer Neg Hx     Social History   Socioeconomic History   Marital status: Married    Spouse name: Not on file   Number of children: 1   Years of education: Not on file   Highest education level: Not on file  Occupational History   Occupation: Engineer, civil (consulting)     Employer: [employer]  Tobacco Use   Smoking status: Never   Smokeless tobacco: Never  Vaping Use   Vaping status: Never Used  Substance and Sexual Activity   Alcohol use: Yes    Alcohol/week: 0.0 standard drinks of alcohol    Comment: occ   Drug use: No   Sexual activity:  Not on file  Other Topics Concern   Not on file  Social History Narrative   Some walking for exercise         Hematology - Dr. Renelda Mom - Dr. Yisroel Ramming   [facility]- [facility]- Dr. Caralee Ates   Social Determinants of Health   Financial Resource Strain: Low Risk  (10/21/2022)   Overall Financial Resource Strain (CARDIA)    Difficulty of Paying Living Expenses: Not hard at all  Food Insecurity: No Food Insecurity (10/21/2022)   Hunger Vital Sign    Worried About Running Out of Food in the Last Year: Never true    Ran Out of Food in the Last Year: Never true  Transportation Needs: No Transportation Needs (10/21/2022)   PRAPARE - Administrator, Civil Service (Medical): No    Lack of Transportation (Non-Medical): No  Physical Activity: Sufficiently Active (10/21/2022)   Exercise Vital Sign    Days of Exercise per Week: 4 days    Minutes of Exercise per Session: 40 min  Stress: Stress Concern Present (10/21/2022)   [employer] of Occupational Health - Occupational Stress Questionnaire    Feeling of Stress : To some extent  Social Connections: Moderately Integrated (10/21/2022)   Social Connection and Isolation Panel [NHANES]    Frequency of Communication with Friends and Family: More than three times a week    Frequency of Social Gatherings with Friends and Family: More than three times a week    Attends Religious Services: Never    Database administrator or Organizations: Yes    Attends Banker  Meetings: More than 4 times per year    Marital Status: Married  Catering manager Violence: Not At Risk (10/21/2022)   Humiliation, Afraid, Rape, and Kick questionnaire    Fear of Current or Ex-Partner: No    Emotionally Abused: No    Physically Abused: No    Sexually Abused: No    Review of Systems:  All other review of systems negative except as mentioned in the HPI.  Physical Exam: Vital signs BP (!) 156/72   Pulse (!) 56   Temp 98.6 F (37 C)  (Temporal)   Ht 5' 4.5" (1.638 m)   Wt 195 lb (88.5 kg)   SpO2 100%   BMI 32.95 kg/m   General:   Alert,  Well-developed, well-nourished, pleasant and cooperative in NAD Lungs:  Clear throughout to auscultation.   Heart:  Regular rate and rhythm; no murmurs, clicks, rubs,  or gallops. Abdomen:  Soft, nontender and nondistended. Normal bowel sounds.   Neuro/Psych:  Alert and cooperative. Normal mood and affect. A and O x 3   @Analie Katzman  Sena Slate, MD, [facility] [phone] (pager) 12/06/2022 2:13 PM@

## 2022-12-09 ENCOUNTER — Telehealth: Payer: Self-pay | Admitting: *Deleted

## 2022-12-09 NOTE — Telephone Encounter (Signed)
  Follow up Call-     12/06/2022    2:10 PM  Call back number  Post procedure Call Back phone  # [phone]  Permission to leave phone message Yes     Patient questions:  Do you have a fever, pain , or abdominal swelling? No. Pain Score  0 *  Have you tolerated food without any problems? Yes.    Have you been able to return to your normal activities? Yes.    Do you have any questions about your discharge instructions: Diet   No. Medications  No. Follow up visit  No.  Do you have questions or concerns about your Care? No.  Actions: * If pain score is 4 or above: No action needed, pain <4.

## 2022-12-11 ENCOUNTER — Ambulatory Visit (INDEPENDENT_AMBULATORY_CARE_PROVIDER_SITE_OTHER): Payer: Medicare Other | Admitting: Family Medicine

## 2022-12-11 VITALS — BP 118/64 | HR 66 | Temp 97.6°F | Ht 64.5 in | Wt 196.0 lb

## 2022-12-11 DIAGNOSIS — E6609 Other obesity due to excess calories: Secondary | ICD-10-CM

## 2022-12-11 DIAGNOSIS — I10 Essential (primary) hypertension: Secondary | ICD-10-CM

## 2022-12-11 DIAGNOSIS — D6851 Activated protein C resistance: Secondary | ICD-10-CM

## 2022-12-11 DIAGNOSIS — Z6833 Body mass index (BMI) 33.0-33.9, adult: Secondary | ICD-10-CM

## 2022-12-11 DIAGNOSIS — E7800 Pure hypercholesterolemia, unspecified: Secondary | ICD-10-CM

## 2022-12-11 DIAGNOSIS — F418 Other specified anxiety disorders: Secondary | ICD-10-CM

## 2022-12-11 DIAGNOSIS — R7303 Prediabetes: Secondary | ICD-10-CM

## 2022-12-11 MED ORDER — BUPROPION HCL ER (XL) 150 MG PO TB24: 150.0000 mg | ORAL_TABLET | Freq: Every day | ORAL | 2 refills | Status: DC

## 2022-12-11 MED ORDER — ALPRAZOLAM 0.25 MG PO TABS: 0.2500 mg | ORAL_TABLET | Freq: Every evening | ORAL | 0 refills | Status: DC | PRN

## 2022-12-11 MED ORDER — SIMVASTATIN 10 MG PO TABS: 10.0000 mg | ORAL_TABLET | Freq: Every day | ORAL | 2 refills | Status: DC

## 2022-12-11 MED ORDER — MECLIZINE HCL 25 MG PO TABS: 25.0000 mg | ORAL_TABLET | ORAL | 1 refills | Status: DC | PRN

## 2022-12-11 MED ORDER — CYCLOBENZAPRINE HCL 10 MG PO TABS: 5.0000 mg | ORAL_TABLET | Freq: Three times a day (TID) | ORAL | 1 refills | Status: DC | PRN

## 2022-12-11 NOTE — Assessment & Plan Note (Signed)
A1c is up  Lab Results  Component Value Date   HGBA1C 6.6 (H) 12/04/2022    Stressed importance of lower glycemic diet Continue exercise  More strength training if possible   Follow up 3 mo

## 2022-12-11 NOTE — Progress Notes (Signed)
Subjective:    Patient ID: Kristine [name], female    DOB: 23-Apr-1956, 67 y.o.   MRN: [id number]  HPI  Here for annual follow up of chronic medical problems   Of note= electricity in office went out before starting visit so aside from brief interview/ visit not completed   Wt Readings from Last 3 Encounters:  12/11/22 196 lb (88.9 kg)  12/06/22 195 lb (88.5 kg)  11/19/22 195 lb (88.5 kg)   33.12 kg/m  Vitals:   12/11/22 0936  BP: 118/64  Pulse: 66  Temp: 97.6 F (36.4 C)  SpO2: 98%    Immunization History  Administered Date(s) Administered   Fluad Quad(high Dose 65+) 03/01/2021   Hepatitis B 05/13/1994   Influenza Split 03/21/2011, 02/03/2013, 02/22/2014   Influenza Whole 02/21/2005, 03/02/2007, 02/09/2009, 03/22/2010   Influenza,inj,Quad PF,6+ Mos 03/29/2015, 03/05/2017, 02/19/2018, 03/02/2019   Influenza-Unspecified 03/28/2015   PFIZER(Purple Top)SARS-COV-2 Vaccination 07/10/2019, 07/30/2019, 02/23/2020   PNEUMOCOCCAL CONJUGATE-20 10/24/2020   Pneumococcal Polysaccharide-23 01/24/2003, 12/07/2007   Pneumococcal-Unspecified 05/13/2013, 10/24/2020   Td 12/07/2007   Tdap 05/13/2013, 04/03/2018   Unspecified SARS-COV-2 Vaccination 07/10/2019, 07/30/2019, 02/22/2020   Zoster, Live 03/07/2016    Health Maintenance Due  Topic Date Due   Zoster Vaccines- Shingrix (1 of 2) 08/23/1973   DEXA SCAN  Never done   COVID-19 Vaccine (10 - 2023-24 season) 05/18/2022     Mammogram 03/2022  Had another breast bx in June (one in November also)-both negative  Last bx was in [place] and wants to stay there for future breast care   MR breast is ordered - will have  Self breast exam: no changes   Gyn care/ pap - [facility]   Had D and C and biopsy in April with gyn  All of this has been stressful   Colon cancer screening -cologuard  neg 01/2020 Had a colonoscopy 11/2022-polyp    Bone health  Dexa - pt thinks this was 2 y ago at gyn and was  normal [employer] Supplements -vitamin d Last vitamin D Lab Results  Component Value Date   VD25OH 31 05/04/2009    Exercise -at gym regularly   Mood    12/11/2022    9:39 AM 10/21/2022    2:55 PM 12/05/2021    8:59 AM 10/18/2021    3:33 PM 10/17/2020    3:35 PM  Depression screen PHQ 2/9  Decreased Interest 0 0 0 0 0  Down, Depressed, Hopeless 0 0 0 0 0  PHQ - 2 Score 0 0 0 0 0  Altered sleeping 1  2 0 0  Tired, decreased energy 1  1 0 0  Change in appetite 0  0 0 0  Feeling bad or failure about yourself  0  0 0 0  Trouble concentrating 0  0 0 0  Moving slowly or fidgety/restless 0  0 0 0  Suicidal thoughts 0  0 0 0  PHQ-9 Score 2  3 0 0  Difficult doing work/chores Not difficult at all   Not difficult at all Not difficult at all   Depression with anxiety  Continues wellbutrin xl 150 mg daily   HTN bp is stable today  No cp or palpitations or headaches or edema  No side effects to medicines  BP Readings from Last 3 Encounters:  12/11/22 118/64  12/06/22 130/66  08/20/22 122/80    Metoprolol xl 25 mg daily  Lasix 20 mg bid   Pulse  Readings from Last 3 Encounters:  12/11/22 66  12/06/22 (!) 55  08/20/22 67   Klor con 10 meq daily  Lab Results  Component Value Date   NA 139 12/04/2022   K 4.4 12/04/2022   CO2 28 12/04/2022   GLUCOSE 141 (H) 12/04/2022   BUN 18 12/04/2022   CREATININE 0.71 12/04/2022   CALCIUM 9.1 12/04/2022   GFR 87.45 12/04/2022   GFRNONAA 93 01/08/2018    Lab Results  Component Value Date   ALT 12 12/04/2022   AST 13 12/04/2022   ALKPHOS 60 12/04/2022   BILITOT 0.6 12/04/2022    Lab Results  Component Value Date   WBC 5.9 12/04/2022   HGB 13.1 12/04/2022   HCT 40.7 12/04/2022   MCV 87.4 12/04/2022   PLT 172.0 12/04/2022   Lab Results  Component Value Date   TSH 2.62 12/04/2022      Cholesterol Lab Results  Component Value Date   CHOL 156 12/04/2022   CHOL 167 12/04/2021   CHOL 161 07/27/2021    Lab Results  Component Value Date   HDL 58.60 12/04/2022   HDL 62.90 12/04/2021   HDL 69.30 07/27/2021   Lab Results  Component Value Date   LDLCALC 80 12/04/2022   LDLCALC 87 12/04/2021   LDLCALC 78 07/27/2021   Lab Results  Component Value Date   TRIG 87.0 12/04/2022   TRIG 88.0 12/04/2021   TRIG 69.0 07/27/2021   Lab Results  Component Value Date   CHOLHDL 3 12/04/2022   CHOLHDL 3 12/04/2021   CHOLHDL 2 07/27/2021   Lab Results  Component Value Date   LDLDIRECT 146.1 08/19/2006   Simvastatin 10 mg daily      Patient Active Problem List   Diagnosis Date Noted   Arm vein blood clot, right 12/26/2020   Estrogen deficiency 10/24/2020   History of pulmonary hypertension 06/08/2019   History of shingles 01/21/2018   Prediabetes 04/21/2015   Stress reaction 04/21/2015   Essential hypertension 04/21/2015   Migraine with aura 06/21/2013   Obesity 06/16/2011   Rhinitis 06/14/2011   Routine general medical examination at a health care facility 06/14/2011   SCIATICA 05/22/2009   GASTROESOPHAGEAL REFLUX DISEASE 04/14/2009   Factor 5 Leiden mutation, heterozygous (HCC) 12/07/2007   Depression with anxiety 11/25/2006   Esophagitis 11/25/2006   IBS 11/25/2006   DVT, HX OF 11/25/2006   MIGRAINES, HX OF 11/25/2006   HYPERCHOLESTEROLEMIA, PURE 10/29/2006   Past Medical History:  Diagnosis Date   Allergy    Anxiety    Basal cell cancer    Basal cell carcinoma    face   Clotting disorder (HCC)    Depression    DVT (deep venous thrombosis) (HCC) 05/13/2006   Esophageal erosions    Factor V Leiden (HCC)    On coag x 1 year ending in 09   Gastritis 05/13/1988   hospital- abd pain   GERD (gastroesophageal reflux disease)    Hyperlipidemia    Hypertension    Radial head fracture 05/13/2009   Past Surgical History:  Procedure Laterality Date   BIOPSY BREAST Right 10/2022   CHOLECYSTECTOMY     COLONOSCOPY     DILATION AND CURETTAGE OF UTERUS  08/22/2022    MOHS SURGERY  11/10/2009   basal cell carcinoma near eye   OOPHORECTOMY     SHOULDER SURGERY  04/12/2006   TONSILLECTOMY  05/13/1974   UPPER GASTROINTESTINAL ENDOSCOPY     Social History   Tobacco Use  Smoking status: Never   Smokeless tobacco: Never  Vaping Use   Vaping status: Never Used  Substance Use Topics   Alcohol use: Yes    Alcohol/week: 0.0 standard drinks of alcohol    Comment: occ   Drug use: No   Family History  Problem Relation Age of Onset   Graves' disease Mother    Depression Mother    Osteoporosis Mother    Heart attack Father        x 3    Breast cancer Sister    Hypertension Sister    Colon polyps Brother    Heart attack Brother        x 2   Diabetes Brother    Hypertension Brother    Coronary artery disease Brother        with stents    Atrial fibrillation Brother    Colon cancer Neg Hx    Esophageal cancer Neg Hx    Rectal cancer Neg Hx    Stomach cancer Neg Hx    Allergies  Allergen Reactions   Oxycodone-Acetaminophen Rash and Other (See Comments)   Paroxetine Hives   Paroxetine Hcl Hives   Sulfa Antibiotics Other (See Comments)   Amoxicillin-Pot Clavulanate     REACTION: GI   Amoxicillin-Pot Clavulanate Other (See Comments)    REACTION: GI   Calcium     REACTION: muscle aches   Erythromycin Other (See Comments)    REACTION: GI   Guaifenesin     REACTION: GI   Naproxen     REACTION: GI   Oxycodone-Acetaminophen     REACTION: reaction not known   Oxycodone-Acetaminophen Other (See Comments)    Other Reaction: Not Assessed   Oxycodone-Aspirin     REACTION: reaction not known   Oxycodone-Aspirin     REACTION: reaction not known    Sertraline Hcl     REACTION: hives   Sertraline Hcl    Sertraline Rash and Other (See Comments)    REACTION: hives   Current Outpatient Medications on File Prior to Visit  Medication Sig Dispense Refill   acetaminophen (TYLENOL) 500 MG tablet Take 500 mg by mouth every 6 (six) hours as needed.      acyclovir ointment (ZOVIRAX) 5 % Apply 1 Application topically every 3 (three) hours. To cold sore as needed 5 g 3   ALPRAZolam (XANAX) 0.25 MG tablet Take 1 tablet (0.25 mg total) by mouth at bedtime as needed for anxiety. 30 tablet 0   buPROPion (WELLBUTRIN XL) 150 MG 24 hr tablet TAKE 1 TABLET BY MOUTH EVERY DAY 90 tablet 0   Cetirizine HCl (ZYRTEC PO) Take 1 tablet by mouth daily.     Cholecalciferol (VITAMIN D3 PO) Take 5,000 Units by mouth daily.     ciclopirox (PENLAC) 8 % solution Apply topically daily.     cyclobenzaprine (FLEXERIL) 10 MG tablet Take 0.5 tablets (5 mg total) by mouth 3 (three) times daily as needed for muscle spasms. 30 tablet 1   ESOMEPRAZOLE MAGNESIUM PO Take 1 tablet by mouth daily.     fluticasone (FLONASE) 50 MCG/ACT nasal spray PLACE 2 SPRAYS INTO THE NOSE DAILY AS NEEDED 48 g 3   furosemide (LASIX) 20 MG tablet Take 1 tablet (20 mg total) by mouth 2 (two) times a week. AND AS NEEDED 90 tablet 3   loratadine (CLARITIN) 10 MG tablet Take 10 mg by mouth as needed for allergies. Reported on 05/19/2015     meclizine (ANTIVERT) 25 MG tablet  Take 1 tablet (25 mg total) by mouth as needed for dizziness. 30 tablet 1   metoprolol succinate (TOPROL-XL) 25 MG 24 hr tablet Take 1 tablet (25 mg total) by mouth daily. 90 tablet 3   potassium chloride (KLOR-CON) 10 MEQ tablet Take 1 tablet (10 mEq total) by mouth daily. AND AS NEEDED WITH YOUR LASIX 90 tablet 3   Probiotic Product (PROBIOTIC PO) Take 1 tablet by mouth daily.     simvastatin (ZOCOR) 10 MG tablet TAKE 1 TABLET BY MOUTH EVERYDAY AT BEDTIME 90 tablet 2   No current facility-administered medications on file prior to visit.    Review of Systems  Constitutional:  Negative for activity change, appetite change, fatigue, fever and unexpected weight change.  HENT:  Negative for congestion, ear pain, rhinorrhea, sinus pressure and sore throat.   Eyes:  Negative for pain, redness and visual disturbance.  Respiratory:   Negative for cough, shortness of breath and wheezing.   Cardiovascular:  Negative for chest pain and palpitations.  Gastrointestinal:  Negative for abdominal pain, blood in stool, constipation and diarrhea.  Endocrine: Negative for polydipsia and polyuria.  Genitourinary:  Negative for dysuria, frequency and urgency.  Musculoskeletal:  Negative for arthralgias, back pain and myalgias.  Skin:  Negative for pallor and rash.  Allergic/Immunologic: Negative for environmental allergies.  Neurological:  Negative for dizziness, syncope and headaches.  Hematological:  Negative for adenopathy. Does not bruise/bleed easily.  Psychiatric/Behavioral:  Negative for decreased concentration and dysphoric mood. The patient is not nervous/anxious.        A lot of stress this year        Objective:   Physical Exam Constitutional:      General: She is not in acute distress.    Appearance: Normal appearance. She is not ill-appearing.  Neurological:     Mental Status: She is alert.           Assessment & Plan:   Problem List Items Addressed This Visit       Cardiovascular and Mediastinum   Essential hypertension    bp in fair control at this time  BP Readings from Last 1 Encounters:  12/11/22 118/64   No changes needed Most recent labs reviewed  Disc lifstyle change with low sodium diet and exercise  Continues metoprolol xl 25 mg daily  Lasix 20 mg daily  Klor con        Hematopoietic and Hemostatic   Factor 5 Leiden mutation, heterozygous (HCC) - Primary    Continues asa         Other   Prediabetes    A1c is up  Lab Results  Component Value Date   HGBA1C 6.6 (H) 12/04/2022    Stressed importance of lower glycemic diet Continue exercise  More strength training if possible   Follow up 3 mo      Obesity    Commended improvement with diet and exercise       HYPERCHOLESTEROLEMIA, PURE    Disc goals for lipids and reasons to control them Rev last labs with pt Rev  low sat fat diet in detail LDL is 87 Simvastatin 10 mg daily -tolerating well  Last calcium score was 0 Very good diet       Depression with anxiety    Continues wellbutrin  Normal PHQ Xanax prn -only in severe stress

## 2022-12-11 NOTE — Assessment & Plan Note (Signed)
Commended improvement with diet and exercise

## 2022-12-11 NOTE — Assessment & Plan Note (Signed)
bp in fair control at this time  BP Readings from Last 1 Encounters:  12/11/22 118/64   No changes needed Most recent labs reviewed  Disc lifstyle change with low sodium diet and exercise  Continues metoprolol xl 25 mg daily  Lasix 20 mg daily  Klor con

## 2022-12-11 NOTE — Assessment & Plan Note (Signed)
Disc goals for lipids and reasons to control them Rev last labs with pt Rev low sat fat diet in detail LDL is 87 Simvastatin 10 mg daily -tolerating well  Last calcium score was 0 Very good diet

## 2022-12-11 NOTE — Assessment & Plan Note (Signed)
Continues asa

## 2022-12-11 NOTE — Assessment & Plan Note (Signed)
Continues wellbutrin  Normal PHQ Xanax prn -only in severe stress

## 2022-12-17 ENCOUNTER — Encounter: Payer: Self-pay | Admitting: Internal Medicine

## 2022-12-19 ENCOUNTER — Encounter: Payer: Self-pay | Admitting: Family Medicine

## 2023-01-01 ENCOUNTER — Other Ambulatory Visit: Payer: Self-pay | Admitting: *Deleted

## 2023-01-14 ENCOUNTER — Encounter: Payer: Self-pay | Admitting: Family Medicine

## 2023-01-14 NOTE — Telephone Encounter (Signed)
Please schedule appt! Thanks

## 2023-01-15 NOTE — Telephone Encounter (Signed)
Patient has been scheduled

## 2023-01-24 ENCOUNTER — Encounter: Payer: Self-pay | Admitting: Family Medicine

## 2023-01-24 ENCOUNTER — Ambulatory Visit (INDEPENDENT_AMBULATORY_CARE_PROVIDER_SITE_OTHER): Payer: Medicare Other | Admitting: Family Medicine

## 2023-01-24 VITALS — BP 131/75 | HR 65 | Temp 97.2°F | Ht 64.5 in | Wt 193.0 lb

## 2023-01-24 DIAGNOSIS — E6609 Other obesity due to excess calories: Secondary | ICD-10-CM | POA: Diagnosis not present

## 2023-01-24 DIAGNOSIS — I10 Essential (primary) hypertension: Secondary | ICD-10-CM | POA: Diagnosis not present

## 2023-01-24 DIAGNOSIS — Z6833 Body mass index (BMI) 33.0-33.9, adult: Secondary | ICD-10-CM

## 2023-01-24 DIAGNOSIS — R7303 Prediabetes: Secondary | ICD-10-CM | POA: Diagnosis not present

## 2023-01-24 NOTE — Assessment & Plan Note (Signed)
Lab Results  Component Value Date   HGBA1C 6.6 (H) 12/04/2022  Now in dm range  Discussed metformin as option Also weight loss (see a/p for obesity)  Low glycemic diet and exercise  She will consider options and let us know if interested in metformin  Follow up in early nov for re check -if not under 6.5 will change dx to dm

## 2023-01-24 NOTE — Assessment & Plan Note (Signed)
bp in fair control at this time  BP Readings from Last 1 Encounters:  01/24/23 131/75   No changes needed Most recent labs reviewed  Disc lifstyle change with low sodium diet and exercise  Continues metoprolol xl 25 mg daily  Lasix 20 mg daily  Klor con

## 2023-01-24 NOTE — Progress Notes (Signed)
Subjective:    Patient ID: Kristine [name], female    DOB: 07/24/1955, 67 y.o.   MRN: [id number]  HPI  Wt Readings from Last 3 Encounters:  01/24/23 193 lb (87.5 kg)  12/11/22 196 lb (88.9 kg)  12/06/22 195 lb (88.5 kg)   32.62 kg/m  Vitals:   01/24/23 1135 01/24/23 1202  BP: (!) 142/84 131/75  Pulse: 65   Temp: (!) 97.2 F (36.2 C)   SpO2: 98%     Pt presents to discuss glucose control   Lab Results  Component Value Date   HGBA1C 6.6 (H) 12/04/2022   Pt is interested in weight loss medication   Exercising (when not on vacation)  [facility] ([facility]-5[facility]  Uses the machines/ for strength  Bike for cardio    Eating and drinking less sugar in general   Less simple carbs No white potato  If bread - 1/2 only   Good about getting protein  Dairy  Egg [place]   Really watching sodium     Dessert occational - very small amount  Occational dark chocolate    Takes wellbutrin xl 150 mg     HTN bp is stable today  No cp or palpitations or headaches or edema  No side effects to medicines  BP Readings from Last 3 Encounters:  01/24/23 131/75  12/11/22 118/64  12/06/22 130/66    Metoprolol xl 25 mg daily  Lasix 20 mg daily  Klor con  Fam history Sister-obesity  Brother also   In past she took tricyclic for depression after pregnancy and it caused weight gain   Craves things-especially bread   Not a big emotional eater   Lab Results  Component Value Date   NA 139 12/04/2022   K 4.4 12/04/2022   CO2 28 12/04/2022   GLUCOSE 141 (H) 12/04/2022   BUN 18 12/04/2022   CREATININE 0.71 12/04/2022   CALCIUM 9.1 12/04/2022   GFR 87.45 12/04/2022   GFRNONAA 93 01/08/2018     Patient Active Problem List   Diagnosis Date Noted   Hx of adenomatous polyp of colon 12/06/2022   Arm vein blood clot, right 12/26/2020   Estrogen deficiency 10/24/2020   History of pulmonary hypertension 06/08/2019   History of shingles  01/21/2018   Prediabetes 04/21/2015   Stress reaction 04/21/2015   Essential hypertension 04/21/2015   Migraine with aura 06/21/2013   Obesity 06/16/2011   Rhinitis 06/14/2011   Routine general medical examination at a health care facility 06/14/2011   SCIATICA 05/22/2009   GASTROESOPHAGEAL REFLUX DISEASE 04/14/2009   Factor 5 Leiden mutation, heterozygous (HCC) 12/07/2007   Depression with anxiety 11/25/2006   Esophagitis 11/25/2006   IBS 11/25/2006   DVT, HX OF 11/25/2006   MIGRAINES, HX OF 11/25/2006   HYPERCHOLESTEROLEMIA, PURE 10/29/2006   Past Medical History:  Diagnosis Date   Allergy    Anxiety    Basal cell cancer    Basal cell carcinoma    face   Clotting disorder (HCC)    Depression    DVT (deep venous thrombosis) (HCC) 05/13/2006   Esophageal erosions    Factor V Leiden (HCC)    On coag x 1 year ending in 09   Gastritis 05/13/1988   hospital- abd pain   GERD (gastroesophageal reflux disease)    Hx of adenomatous polyp of colon 12/06/2022   5 mm rectal adenoma repeat colonoscopy 2031    Hyperlipidemia  Hypertension    Radial head fracture 05/13/2009   Past Surgical History:  Procedure Laterality Date   BIOPSY BREAST Right 10/2022   CHOLECYSTECTOMY     COLONOSCOPY     DILATION AND CURETTAGE OF UTERUS  08/22/2022   MOHS SURGERY  11/10/2009   basal cell carcinoma near eye   OOPHORECTOMY     SHOULDER SURGERY  04/12/2006   TONSILLECTOMY  05/13/1974   UPPER GASTROINTESTINAL ENDOSCOPY     Social History   Tobacco Use   Smoking status: Never   Smokeless tobacco: Never  Vaping Use   Vaping status: Never Used  Substance Use Topics   Alcohol use: Yes    Alcohol/week: 0.0 standard drinks of alcohol    Comment: occ   Drug use: No   Family History  Problem Relation Age of Onset   Graves' disease Mother    Depression Mother    Osteoporosis Mother    Heart attack Father        x 3    Breast cancer Sister    Hypertension Sister    Colon polyps  Brother    Heart attack Brother        x 2   Diabetes Brother    Hypertension Brother    Coronary artery disease Brother        with stents    Atrial fibrillation Brother    Colon cancer Neg Hx    Esophageal cancer Neg Hx    Rectal cancer Neg Hx    Stomach cancer Neg Hx    Allergies  Allergen Reactions   Oxycodone-Acetaminophen Rash and Other (See Comments)   Paroxetine Hives   Paroxetine Hcl Hives   Sulfa Antibiotics Other (See Comments)   Amoxicillin-Pot Clavulanate     REACTION: GI   Amoxicillin-Pot Clavulanate Other (See Comments)    REACTION: GI   Calcium     REACTION: muscle aches   Erythromycin Other (See Comments)    REACTION: GI   Guaifenesin     REACTION: GI   Naproxen     REACTION: GI   Oxycodone-Acetaminophen     REACTION: reaction not known   Oxycodone-Acetaminophen Other (See Comments)    Other Reaction: Not Assessed   Oxycodone-Aspirin     REACTION: reaction not known   Oxycodone-Aspirin     REACTION: reaction not known    Sertraline Hcl     REACTION: hives   Sertraline Hcl    Sertraline Rash and Other (See Comments)    REACTION: hives   Current Outpatient Medications on File Prior to Visit  Medication Sig Dispense Refill   acetaminophen (TYLENOL) 500 MG tablet Take 500 mg by mouth every 6 (six) hours as needed.     acyclovir ointment (ZOVIRAX) 5 % Apply 1 Application topically every 3 (three) hours. To cold sore as needed 5 g 3   ALPRAZolam (XANAX) 0.25 MG tablet Take 1 tablet (0.25 mg total) by mouth at bedtime as needed for anxiety. 30 tablet 0   buPROPion (WELLBUTRIN XL) 150 MG 24 hr tablet Take 1 tablet (150 mg total) by mouth daily. 90 tablet 2   Cetirizine HCl (ZYRTEC PO) Take 1 tablet by mouth daily.     Cholecalciferol (VITAMIN D3 PO) Take 5,000 Units by mouth daily.     ciclopirox (PENLAC) 8 % solution Apply topically daily.     cyclobenzaprine (FLEXERIL) 10 MG tablet Take 0.5 tablets (5 mg total) by mouth 3 (three) times daily as needed  for muscle  spasms. 30 tablet 1   ESOMEPRAZOLE MAGNESIUM PO Take 1 tablet by mouth daily.     fluticasone (FLONASE) 50 MCG/ACT nasal spray PLACE 2 SPRAYS INTO THE NOSE DAILY AS NEEDED 48 g 3   furosemide (LASIX) 20 MG tablet Take 1 tablet (20 mg total) by mouth 2 (two) times a week. AND AS NEEDED 90 tablet 3   loratadine (CLARITIN) 10 MG tablet Take 10 mg by mouth as needed for allergies. Reported on 05/19/2015     meclizine (ANTIVERT) 25 MG tablet Take 1 tablet (25 mg total) by mouth as needed for dizziness. 30 tablet 1   metoprolol succinate (TOPROL-XL) 25 MG 24 hr tablet Take 1 tablet (25 mg total) by mouth daily. 90 tablet 3   potassium chloride (KLOR-CON) 10 MEQ tablet Take 1 tablet (10 mEq total) by mouth daily. AND AS NEEDED WITH YOUR LASIX 90 tablet 3   Probiotic Product (PROBIOTIC PO) Take 1 tablet by mouth daily.     simvastatin (ZOCOR) 10 MG tablet Take 1 tablet (10 mg total) by mouth daily. 90 tablet 2   No current facility-administered medications on file prior to visit.    Review of Systems  Constitutional:  Negative for activity change, appetite change, fatigue, fever and unexpected weight change.       Carb cravings   HENT:  Negative for congestion, ear pain, rhinorrhea, sinus pressure and sore throat.   Eyes:  Negative for pain, redness and visual disturbance.  Respiratory:  Negative for cough, shortness of breath and wheezing.   Cardiovascular:  Negative for chest pain and palpitations.  Gastrointestinal:  Negative for abdominal pain, blood in stool, constipation and diarrhea.  Endocrine: Negative for polydipsia and polyuria.  Genitourinary:  Negative for dysuria, frequency and urgency.  Musculoskeletal:  Negative for arthralgias, back pain and myalgias.  Skin:  Negative for pallor and rash.  Allergic/Immunologic: Negative for environmental allergies.  Neurological:  Negative for dizziness, syncope and headaches.  Hematological:  Negative for adenopathy. Does not  bruise/bleed easily.  Psychiatric/Behavioral:  Negative for decreased concentration and dysphoric mood. The patient is not nervous/anxious.        Objective:   Physical Exam Constitutional:      General: She is not in acute distress.    Appearance: Normal appearance. She is well-developed. She is obese. She is not ill-appearing or diaphoretic.  HENT:     Head: Normocephalic and atraumatic.  Eyes:     Conjunctiva/sclera: Conjunctivae normal.     Pupils: Pupils are equal, round, and reactive to light.  Neck:     Thyroid: No thyromegaly.     Vascular: No carotid bruit or JVD.  Cardiovascular:     Rate and Rhythm: Normal rate and regular rhythm.     Heart sounds: Normal heart sounds.     No gallop.  Pulmonary:     Effort: Pulmonary effort is normal. No respiratory distress.     Breath sounds: Normal breath sounds. No wheezing or rales.  Abdominal:     General: There is no distension or abdominal bruit.     Palpations: Abdomen is soft.  Musculoskeletal:     Cervical back: Normal range of motion and neck supple.     Right lower leg: No edema.     Left lower leg: No edema.  Lymphadenopathy:     Cervical: No cervical adenopathy.  Skin:    General: Skin is warm and dry.     Coloration: Skin is not pale.  Findings: No rash.  Neurological:     Mental Status: She is alert.     Coordination: Coordination normal.     Deep Tendon Reflexes: Reflexes are normal and symmetric. Reflexes normal.  Psychiatric:        Mood and Affect: Mood normal.           Assessment & Plan:   Problem List Items Addressed This Visit       Cardiovascular and Mediastinum   Essential hypertension    bp in fair control at this time  BP Readings from Last 1 Encounters:  01/24/23 131/75   No changes needed Most recent labs reviewed  Disc lifstyle change with low sodium diet and exercise  Continues metoprolol xl 25 mg daily  Lasix 20 mg daily  Klor con        Other   Obesity - Primary     Discussed history and options for treatment today  Bmi is 32.6 Has cravings  Trying to eat low glyemic  Also exercise-encouraged 5 d per week 30 min minimum  A1c is up to 6.6  Thinks she can do better with diet  Discussed some of these options to help  Metformin  Increase dose of wellbutrin  Contrave  GLP (in future if covered) Healthy weight and wellness clinic Weight watchers  Mediterranean diet   She wants to review options and get back to [place]    28  Minutes were spent today both face to face and in the chart obtaining history, reviewing records and test results,, educating and discussing treatment options, and placing orders           Prediabetes    Lab Results  Component Value Date   HGBA1C 6.6 (H) 12/04/2022  Now in dm range  Discussed metformin as option Also weight loss (see a/p for obesity)  Low glycemic diet and exercise  She will consider options and let us know if interested in metformin  Follow up in early nov for re check -if not under 6.5 will change dx to dm

## 2023-01-24 NOTE — Patient Instructions (Addendum)
I would consider   Low dose metformin   Increase wellbutrin to 300 mg   Contrave (wellbutrin plus naltrexone)- ? Coverage  GLP drug may be an option in the future   Try to get most of your carbohydrates from produce (with the exception of white potatoes) and whole grains Eat less bread/pasta/rice/snack foods/cereals/sweets and other items from the middle of the grocery store (processed carbs)  Keep strength training          Mediterranean Diet  Why follow it? Research shows. Those who follow the Mediterranean diet have a reduced risk of heart disease  The diet is associated with a reduced incidence of Parkinson's and Alzheimer's diseases People following the diet may have longer life expectancies and lower rates of chronic diseases  The Dietary Guidelines for Americans recommends the Mediterranean diet as an eating plan to promote health and prevent disease  What Is the Mediterranean Diet?  Healthy eating plan based on typical foods and recipes of Mediterranean-style cooking The diet is primarily a plant based diet; these foods should make up a majority of meals   Starches - Plant based foods should make up a majority of meals - They are an important sources of vitamins, minerals, energy, antioxidants, and fiber - Choose whole grains, foods high in fiber and minimally processed items  - Typical grain sources include wheat, oats, barley, corn, brown rice, bulgar, farro, millet, polenta, couscous  - Various types of beans include chickpeas, lentils, fava beans, black beans, white beans   Fruits  Veggies - Large quantities of antioxidant rich fruits & veggies; 6 or more servings  - Vegetables can be eaten raw or lightly drizzled with oil and cooked  - Vegetables common to the traditional Mediterranean Diet include: artichokes, arugula, beets, broccoli, brussel sprouts, cabbage, carrots, celery, collard greens, cucumbers, eggplant, kale, leeks, lemons, lettuce, mushrooms, okra,  onions, peas, peppers, potatoes, pumpkin, radishes, rutabaga, shallots, spinach, sweet potatoes, turnips, zucchini - Fruits common to the Mediterranean Diet include: apples, apricots, avocados, cherries, clementines, dates, figs, grapefruits, grapes, melons, nectarines, oranges, peaches, pears, pomegranates, strawberries, tangerines  Fats - Replace butter and margarine with healthy oils, such as olive oil, canola oil, and tahini  - Limit nuts to no more than a handful a day  - Nuts include walnuts, almonds, pecans, pistachios, pine nuts  - Limit or avoid candied, honey roasted or heavily salted nuts - Olives are central to the [employer] - can be eaten whole or used in a variety of dishes   Meats Protein - Limiting red meat: no more than a few times a month - When eating red meat: choose lean cuts and keep the portion to the size of deck of cards - Eggs: approx. 0 to 4 times a week  - Fish and lean poultry: at least 2 a week  - Healthy protein sources include, chicken, [place], lean beef, lamb - Increase intake of seafood such as tuna, salmon, trout, mackerel, shrimp, scallops - Avoid or limit high fat processed meats such as sausage and bacon  Dairy - Include moderate amounts of low fat dairy products  - Focus on healthy dairy such as fat free yogurt, skim milk, low or reduced fat cheese - Limit dairy products higher in fat such as whole or 2% milk, cheese, ice cream  Alcohol - Moderate amounts of red wine is ok  - No more than 5 oz daily for women (all ages) and men older than age 49  - No more  than 10 oz of wine daily for men younger than 39  Other - Limit sweets and other desserts  - Use herbs and spices instead of salt to flavor foods  - Herbs and spices common to the traditional Mediterranean Diet include: basil, bay leaves, chives, cloves, cumin, fennel, garlic, lavender, marjoram, mint, oregano, parsley, pepper, rosemary, sage, savory, sumac, tarragon, thyme   It's not just a  diet, it's a lifestyle:  The Mediterranean diet includes lifestyle factors typical of those in the region  Foods, drinks and meals are best eaten with others and savored Daily physical activity is important for overall good health This could be strenuous exercise like running and aerobics This could also be more leisurely activities such as walking, housework, yard-work, or taking the stairs Moderation is the key; a balanced and healthy diet accommodates most foods and drinks Consider portion sizes and frequency of consumption of certain foods   Meal Ideas & Options:  Breakfast:  Whole wheat toast or whole wheat English muffins with peanut butter & hard boiled egg Steel cut oats topped with apples & cinnamon and skim milk  Fresh fruit: banana, strawberries, melon, berries, peaches  Smoothies: strawberries, bananas, greek yogurt, peanut butter Low fat greek yogurt with blueberries and granola  Egg white omelet with spinach and mushrooms Breakfast couscous: whole wheat couscous, apricots, skim milk, cranberries  Sandwiches:  Hummus and grilled vegetables (peppers, zucchini, squash) on whole wheat bread   Grilled chicken on whole wheat pita with lettuce, tomatoes, cucumbers or tzatziki  [place] salad on whole wheat bread: tuna salad made with greek yogurt, olives, red peppers, capers, green onions Garlic rosemary lamb pita: lamb sauted with garlic, rosemary, salt & pepper; add lettuce, cucumber, greek yogurt to pita - flavor with lemon juice and black pepper  Seafood:  Mediterranean grilled salmon, seasoned with garlic, basil, parsley, lemon juice and black pepper Shrimp, lemon, and spinach whole-grain pasta salad made with low fat greek yogurt  Seared scallops with lemon orzo  Seared tuna steaks seasoned salt, pepper, coriander topped with tomato mixture of olives, tomatoes, olive oil, minced garlic, parsley, green onions and cappers  Meats:  Herbed greek chicken salad with kalamata olives,  cucumber, feta  Red bell peppers stuffed with spinach, bulgur, lean ground beef (or lentils) & topped with feta   Kebabs: skewers of chicken, tomatoes, onions, zucchini, squash  [place] burgers: made with red onions, mint, dill, lemon juice, feta cheese topped with roasted red peppers Vegetarian Cucumber salad: cucumbers, artichoke hearts, celery, red onion, feta cheese, tossed in olive oil & lemon juice  Hummus and whole grain pita points with a greek salad (lettuce, tomato, feta, olives, cucumbers, red onion) Lentil soup with celery, carrots made with vegetable broth, garlic, salt and pepper  Tabouli salad: parsley, bulgur, mint, scallions, cucumbers, tomato, radishes, lemon juice, olive oil, salt and pepper.

## 2023-01-24 NOTE — Assessment & Plan Note (Addendum)
Discussed history and options for treatment today  Bmi is 32.6 Has cravings  Trying to eat low glyemic  Also exercise-encouraged 5 d per week 30 min minimum  A1c is up to 6.6  Thinks she can do better with diet  Discussed some of these options to help  Metformin  Increase dose of wellbutrin  Contrave  GLP (in future if covered) Healthy weight and wellness clinic Weight watchers  Mediterranean diet   She wants to review options and get back to [place]    28  Minutes were spent today both face to face and in the chart obtaining history, reviewing records and test results, performing exam ,, educating and discussing treatment options, and placing orders

## 2023-02-03 ENCOUNTER — Encounter: Payer: Self-pay | Admitting: Family Medicine

## 2023-02-04 MED ORDER — METFORMIN HCL ER 500 MG PO TB24: 500.0000 mg | ORAL_TABLET | Freq: Every day | ORAL | 1 refills | Status: DC

## 2023-02-04 MED ORDER — BUPROPION HCL ER (XL) 300 MG PO TB24: 300.0000 mg | ORAL_TABLET | Freq: Every day | ORAL | 1 refills | Status: DC

## 2023-04-25 ENCOUNTER — Ambulatory Visit
Admission: EM | Admit: 2023-04-25 | Discharge: 2023-04-25 | Disposition: A | Discharge status: Home / Self Care | Payer: Medicare Other | Attending: Emergency Medicine | Admitting: Emergency Medicine

## 2023-04-25 DIAGNOSIS — J209 Acute bronchitis, unspecified: Secondary | ICD-10-CM

## 2023-04-25 DIAGNOSIS — J0100 Acute maxillary sinusitis, unspecified: Secondary | ICD-10-CM | POA: Diagnosis not present

## 2023-04-25 MED ORDER — ALBUTEROL SULFATE HFA 108 (90 BASE) MCG/ACT IN AERS: 1.0000 | INHALATION_SPRAY | Freq: Four times a day (QID) | RESPIRATORY_TRACT | 0 refills | Status: DC | PRN

## 2023-04-25 MED ORDER — DOXYCYCLINE HYCLATE 100 MG PO CAPS: 100.0000 mg | ORAL_CAPSULE | Freq: Two times a day (BID) | ORAL | 0 refills | Status: AC

## 2023-04-25 NOTE — ED Provider Notes (Signed)
Renaldo Fiddler    CSN: [id number] Arrival date & time: 04/25/23  1053      History   Chief Complaint Chief Complaint  Patient presents with   Cough   Generalized Body Aches    HPI Kristine [name] is a 67 y.o. female.  Patient presents with 1 week history of congestion, sinus pressure,, cough, wheezing, shortness of breath, body aches.  No known fever.  No chest pain.  Treating symptoms with Flonase nasal spray, saline nasal spray, and Tylenol.  Her medical history includes hypertension, prediabetes, factor V Leiden.  The history is provided by the patient and medical records.    Past Medical History:  Diagnosis Date   Allergy    Anxiety    Basal cell cancer    Basal cell carcinoma    face   Clotting disorder (HCC)    Depression    DVT (deep venous thrombosis) (HCC) 05/13/2006   Esophageal erosions    Factor V Leiden (HCC)    On coag x 1 year ending in 09   Gastritis 05/13/1988   hospital- abd pain   GERD (gastroesophageal reflux disease)    Hx of adenomatous polyp of colon 12/06/2022   5 mm rectal adenoma repeat colonoscopy 2031    Hyperlipidemia    Hypertension    Radial head fracture 05/13/2009    Patient Active Problem List   Diagnosis Date Noted   Hx of adenomatous polyp of colon 12/06/2022   Arm vein blood clot, right 12/26/2020   Estrogen deficiency 10/24/2020   History of pulmonary hypertension 06/08/2019   History of shingles 01/21/2018   Prediabetes 04/21/2015   Stress reaction 04/21/2015   Essential hypertension 04/21/2015   Migraine with aura 06/21/2013   Obesity 06/16/2011   Rhinitis 06/14/2011   Routine general medical examination at a health care facility 06/14/2011   SCIATICA 05/22/2009   GASTROESOPHAGEAL REFLUX DISEASE 04/14/2009   Factor 5 Leiden mutation, heterozygous (HCC) 12/07/2007   Depression with anxiety 11/25/2006   Esophagitis 11/25/2006   IBS 11/25/2006   DVT, HX OF 11/25/2006   MIGRAINES, HX OF 11/25/2006    HYPERCHOLESTEROLEMIA, PURE 10/29/2006    Past Surgical History:  Procedure Laterality Date   BIOPSY BREAST Right 10/2022   CHOLECYSTECTOMY     COLONOSCOPY     DILATION AND CURETTAGE OF UTERUS  08/22/2022   MOHS SURGERY  11/10/2009   basal cell carcinoma near eye   OOPHORECTOMY     SHOULDER SURGERY  04/12/2006   TONSILLECTOMY  05/13/1974   UPPER GASTROINTESTINAL ENDOSCOPY      OB History   No obstetric history on file.      Home Medications    Prior to Admission medications   Medication Sig Start Date End Date Taking? Authorizing Provider  albuterol (VENTOLIN HFA) 108 (90 Base) MCG/ACT inhaler Inhale 1-2 puffs into the lungs every 6 (six) hours as needed. 04/25/23  Yes Mickie Bail, NP  doxycycline (VIBRAMYCIN) 100 MG capsule Take 1 capsule (100 mg total) by mouth 2 (two) times daily for 7 days. 04/25/23 05/02/23 Yes Mickie Bail, NP  acetaminophen (TYLENOL) 500 MG tablet Take 500 mg by mouth every 6 (six) hours as needed.    [provider]  acyclovir ointment (ZOVIRAX) 5 % Apply 1 Application topically every 3 (three) hours. To cold sore as needed 08/28/22   Tower, Audrie Gallus, MD  ALPRAZolam Prudy Feeler) 0.25 MG tablet Take 1 tablet (0.25 mg total) by mouth at bedtime as needed  for anxiety. 12/11/22   Tower, Audrie Gallus, MD  buPROPion (WELLBUTRIN XL) 300 MG 24 hr tablet Take 1 tablet (300 mg total) by mouth daily. 02/04/23   Tower, Audrie Gallus, MD  Cetirizine HCl (ZYRTEC PO) Take 1 tablet by mouth daily. 03/13/20   [provider]  Cholecalciferol (VITAMIN D3 PO) Take 5,000 Units by mouth daily.    [provider]  ciclopirox (PENLAC) 8 % solution Apply topically daily. 05/08/21   [provider]  cyclobenzaprine (FLEXERIL) 10 MG tablet Take 0.5 tablets (5 mg total) by mouth 3 (three) times daily as needed for muscle spasms. 12/11/22   Tower, Audrie Gallus, MD  ESOMEPRAZOLE MAGNESIUM PO Take 1 tablet by mouth daily. 02/11/20   [provider]  fluticasone  (FLONASE) 50 MCG/ACT nasal spray PLACE 2 SPRAYS INTO THE NOSE DAILY AS NEEDED 04/03/18   Tower, Audrie Gallus, MD  furosemide (LASIX) 20 MG tablet Take 1 tablet (20 mg total) by mouth 2 (two) times a week. AND AS NEEDED 08/22/22   Dyann Kief, PA-C  loratadine (CLARITIN) 10 MG tablet Take 10 mg by mouth as needed for allergies. Reported on 05/19/2015    [provider]  meclizine (ANTIVERT) 25 MG tablet Take 1 tablet (25 mg total) by mouth as needed for dizziness. 12/11/22   Tower, Audrie Gallus, MD  metFORMIN (GLUCOPHAGE-XR) 500 MG 24 hr tablet Take 1 tablet (500 mg total) by mouth daily with breakfast. 02/04/23   Tower, Audrie Gallus, MD  metoprolol succinate (TOPROL-XL) 25 MG 24 hr tablet Take 1 tablet (25 mg total) by mouth daily. 08/20/22   Dyann Kief, PA-C  potassium chloride (KLOR-CON) 10 MEQ tablet Take 1 tablet (10 mEq total) by mouth daily. AND AS NEEDED WITH YOUR LASIX 08/20/22   Dyann Kief, PA-C  Probiotic Product (PROBIOTIC PO) Take 1 tablet by mouth daily.    [provider]  simvastatin (ZOCOR) 10 MG tablet Take 1 tablet (10 mg total) by mouth daily. 12/11/22   Tower, Audrie Gallus, MD    Family History Family History  Problem Relation Age of Onset   Graves' disease Mother    Depression Mother    Osteoporosis Mother    Heart attack Father        x 3    Breast cancer Sister    Hypertension Sister    Colon polyps Brother    Heart attack Brother        x 2   Diabetes Brother    Hypertension Brother    Coronary artery disease Brother        with stents    Atrial fibrillation Brother    Colon cancer Neg Hx    Esophageal cancer Neg Hx    Rectal cancer Neg Hx    Stomach cancer Neg Hx     Social History Social History   Tobacco Use   Smoking status: Never   Smokeless tobacco: Never  Vaping Use   Vaping status: Never Used  Substance Use Topics   Alcohol use: Yes    Alcohol/week: 0.0 standard drinks of alcohol    Comment: occ   Drug use: No     Allergies    Oxycodone-acetaminophen, Paroxetine, Paroxetine hcl, Sulfa antibiotics, Amoxicillin-pot clavulanate, Amoxicillin-pot clavulanate, Calcium, Erythromycin, Guaifenesin, Naproxen, Oxycodone-acetaminophen, Oxycodone-acetaminophen, Oxycodone-aspirin, Oxycodone-aspirin, Sertraline hcl, Sertraline hcl, and Sertraline   Review of Systems Review of Systems  Constitutional:  Negative for chills and fever.  HENT:  Positive for congestion, postnasal drip, rhinorrhea  and sinus pressure. Negative for ear pain and sore throat.   Respiratory:  Positive for cough, shortness of breath and wheezing.      Physical Exam Triage Vital Signs ED Triage Vitals  Encounter Vitals Group     BP 04/25/23 1229 (!) 141/82     Systolic BP Percentile --      Diastolic BP Percentile --      Pulse Rate 04/25/23 1216 70     Resp 04/25/23 1216 18     Temp 04/25/23 1216 98.7 F (37.1 C)     Temp src --      SpO2 04/25/23 1216 98 %     Weight --      Height --      Head Circumference --      Peak Flow --      Pain Score 04/25/23 1222 4     Pain Loc --      Pain Education --      Exclude from Growth Chart --    No data found.  Updated Vital Signs BP (!) 141/82   Pulse 70   Temp 98.7 F (37.1 C)   Resp 18   SpO2 98%   Visual Acuity Right Eye Distance:   Left Eye Distance:   Bilateral Distance:    Right Eye Near:   Left Eye Near:    Bilateral Near:     Physical Exam Constitutional:      General: She is not in acute distress. HENT:     Right Ear: Tympanic membrane normal.     Left Ear: Tympanic membrane normal.     Nose: Congestion and rhinorrhea present.     Mouth/Throat:     Mouth: Mucous membranes are moist.     Pharynx: Oropharynx is clear.  Cardiovascular:     Rate and Rhythm: Normal rate and regular rhythm.     Heart sounds: Normal heart sounds.  Pulmonary:     Effort: Pulmonary effort is normal. No respiratory distress.     Breath sounds: Normal breath sounds.  Skin:    General: Skin  is warm and dry.  Neurological:     Mental Status: She is alert.      UC Treatments / Results  Labs (all labs ordered are listed, but only abnormal results are displayed) Labs Reviewed - No data to display  EKG   Radiology No results found.  Procedures Procedures (including critical care time)  Medications Ordered in UC Medications - No data to display  Initial Impression / Assessment and Plan / UC Course  I have reviewed the triage vital signs and the nursing notes.  Pertinent labs & imaging results that were available during my care of the patient were reviewed by me and considered in my medical decision making (see chart for details).    Acute sinusitis, acute bronchitis.  Lungs are clear and O2 sat is 98% on room air.  Treating today with albuterol inhaler and doxycycline (Patient is allergic to several medications).  Education provided on sinus infection and bronchitis.  Instructed patient to follow-up with her PCP if she is not improving.  She agrees to plan of care.  Final Clinical Impressions(s) / UC Diagnoses   Final diagnoses:  Acute non-recurrent maxillary sinusitis  Acute bronchitis, unspecified organism     Discharge Instructions      Take the doxycycline and use the albuterol inhaler as directed.  Follow-up with your primary care provider if your symptoms are not improving.  ED Prescriptions     Medication Sig Dispense Auth. Provider   albuterol (VENTOLIN HFA) 108 (90 Base) MCG/ACT inhaler Inhale 1-2 puffs into the lungs every 6 (six) hours as needed. 18 g Mickie Bail, NP   doxycycline (VIBRAMYCIN) 100 MG capsule Take 1 capsule (100 mg total) by mouth 2 (two) times daily for 7 days. 14 capsule Mickie Bail, NP      PDMP not reviewed this encounter.   Mickie Bail, NP 04/25/23 1300

## 2023-04-25 NOTE — Discharge Instructions (Addendum)
Take the doxycycline and use the albuterol inhaler as directed.  Follow-up with your primary care provider if your symptoms are not improving.

## 2023-04-25 NOTE — ED Triage Notes (Signed)
Patient to Urgent Care with complaints of productive (thick/ chunky/ tan colored) cough/ generalized body aches/ facial pressure/ sinus pain/ wheezing/ chest congestion. Possible fevers at night- wakes up soaked in sweat.   Reports symptoms started approx 1 week ago.   Meds: taking saline nasal spray/ tylenol

## 2023-06-11 ENCOUNTER — Ambulatory Visit (INDEPENDENT_AMBULATORY_CARE_PROVIDER_SITE_OTHER): Payer: Medicare Other | Admitting: Podiatry

## 2023-06-11 ENCOUNTER — Ambulatory Visit (INDEPENDENT_AMBULATORY_CARE_PROVIDER_SITE_OTHER): Payer: Medicare Other

## 2023-06-11 ENCOUNTER — Encounter: Payer: Self-pay | Admitting: Podiatry

## 2023-06-11 DIAGNOSIS — M79671 Pain in right foot: Secondary | ICD-10-CM | POA: Diagnosis not present

## 2023-06-11 DIAGNOSIS — M79672 Pain in left foot: Secondary | ICD-10-CM

## 2023-06-11 NOTE — Progress Notes (Signed)
  Subjective:  Patient ID: Kristine [name], female    DOB: 12-07-1955,   MRN: [id number]  No chief complaint on file.   68 y.o. female presents for concern of pain and inflammation to right second toe. Relates this has been on going for several months and has been hindering her walking. Relate some issues with her bunion on the left as well. She has tried some offloading to help and tylenol but still getting pain. Has some trips coming up and would like to make sure she gets through. Does have a history of factor V leiden.  . Denies any other pedal complaints. Denies n/v/f/c.   Past Medical History:  Diagnosis Date   Allergy    Anxiety    Basal cell cancer    Basal cell carcinoma    face   Clotting disorder (HCC)    Depression    DVT (deep venous thrombosis) (HCC) 05/13/2006   Esophageal erosions    Factor V Leiden (HCC)    On coag x 1 year ending in 09   Gastritis 05/13/1988   hospital- abd pain   GERD (gastroesophageal reflux disease)    Hx of adenomatous polyp of colon 12/06/2022   5 mm rectal adenoma repeat colonoscopy 2031    Hyperlipidemia    Hypertension    Radial head fracture 05/13/2009    Objective:  Physical Exam: Vascular: DP/PT pulses 2/4 bilateral. CFT <3 seconds. Normal hair growth on digits. No edema.  Skin. No lacerations or abrasions bilateral feet.  Musculoskeletal: MMT 5/5 bilateral lower extremities in DF, PF, Inversion and Eversion. Deceased ROM in DF of ankle joint. Mild HAV deformity noted bilatearl and hammering of second digit bilateral more so on right. Hyperkeratotic cored lesion noted to medial right second digit.  Neurological: Sensation intact to light touch.   Assessment:   1. Bilateral foot pain      Plan:  Patient was evaluated and treated and all questions answered. X-rays reviewed and discussed with patient. No acute fractures or dislocations noted. Hammered second digiti bilateral. Very mild to no increase in IM 1-2 angle mild  lateral deviation of sesamoids on first metatarsal head bilateral.  -Educated on hammertoes and bunion deformities  and treatment options  -Hyperkeratotic lesion debrided without incident with chisel as courtesy.  -Discussed padding including toe caps and crest pads.  -Discussed need for potential surgery if pain does not improved.  -Patient to follow-up as needed. Discussed calling if any changes or increased pain.     Louann Sjogren, DPM

## 2023-07-20 ENCOUNTER — Ambulatory Visit
Admission: EM | Admit: 2023-07-20 | Discharge: 2023-07-20 | Disposition: A | Discharge status: Home / Self Care | Attending: Emergency Medicine | Admitting: Emergency Medicine

## 2023-07-20 DIAGNOSIS — J0100 Acute maxillary sinusitis, unspecified: Secondary | ICD-10-CM

## 2023-07-20 DIAGNOSIS — J069 Acute upper respiratory infection, unspecified: Secondary | ICD-10-CM

## 2023-07-20 MED ORDER — AZITHROMYCIN 250 MG PO TABS: 250.0000 mg | ORAL_TABLET | Freq: Every day | ORAL | 0 refills | Status: DC

## 2023-07-20 NOTE — Discharge Instructions (Signed)
 Take the Zithromax as directed.  Follow up with your primary care provider if your symptoms are not improving.

## 2023-07-20 NOTE — ED Triage Notes (Addendum)
 Patient to Urgent Care with complaints of facial pressure and pain/ productive cough (thick and green)/ fatigue. Denies any known fevers.   Symptoms started Monday. Started feeling worse on Thursday.   Meds: using her inhaler/ Mucinex/ saline spray/ flonase/ tylenol.

## 2023-07-20 NOTE — ED Provider Notes (Signed)
 [facility]    CSN: [id number] Arrival date & time: 07/20/23  1117      History   Chief Complaint Chief Complaint  Patient presents with   Facial Pain   Cough    HPI Kristine [name] is a 68 y.o. female.  Patient presents with 2-week history of sinus congestion and pressure.  The symptoms got worse 3 to 4 days ago.  She has a cough that is productive of thick green sputum, increased sinus pressure, fatigue.  No fever or shortness of breath.  She has been treating her symptoms with Mucinex, Flonase, Tylenol.  Yesterday she used the albuterol inhaler previously prescribed.  Patient was seen here on 04/25/2023; diagnosed with acute sinusitis and bronchitis; treated with albuterol inhaler and doxycycline.  The history is provided by the patient and medical records.    Past Medical History:  Diagnosis Date   Allergy    Anxiety    Basal cell cancer    Basal cell carcinoma    face   Clotting disorder (HCC)    Depression    DVT (deep venous thrombosis) (HCC) 05/13/2006   Esophageal erosions    Factor V Leiden (HCC)    On coag x 1 year ending in 09   Gastritis 05/13/1988   hospital- abd pain   GERD (gastroesophageal reflux disease)    Hx of adenomatous polyp of colon 12/06/2022   5 mm rectal adenoma repeat colonoscopy 2031    Hyperlipidemia    Hypertension    Radial head fracture 05/13/2009    Patient Active Problem List   Diagnosis Date Noted   Hx of adenomatous polyp of colon 12/06/2022   Arm vein blood clot, right 12/26/2020   Estrogen deficiency 10/24/2020   History of pulmonary hypertension 06/08/2019   History of shingles 01/21/2018   Prediabetes 04/21/2015   Stress reaction 04/21/2015   Essential hypertension 04/21/2015   Migraine with aura 06/21/2013   Obesity 06/16/2011   Rhinitis 06/14/2011   Routine general medical examination at a health care facility 06/14/2011   SCIATICA 05/22/2009   GASTROESOPHAGEAL REFLUX DISEASE 04/14/2009   Factor 5  Leiden mutation, heterozygous (HCC) 12/07/2007   Depression with anxiety 11/25/2006   Esophagitis 11/25/2006   IBS 11/25/2006   DVT, HX OF 11/25/2006   MIGRAINES, HX OF 11/25/2006   HYPERCHOLESTEROLEMIA, PURE 10/29/2006    Past Surgical History:  Procedure Laterality Date   BIOPSY BREAST Right 10/2022   CHOLECYSTECTOMY     COLONOSCOPY     DILATION AND CURETTAGE OF UTERUS  08/22/2022   MOHS SURGERY  11/10/2009   basal cell carcinoma near eye   OOPHORECTOMY     SHOULDER SURGERY  04/12/2006   TONSILLECTOMY  05/13/1974   UPPER GASTROINTESTINAL ENDOSCOPY      OB History   No obstetric history on file.      Home Medications    Prior to Admission medications   Medication Sig Start Date End Date Taking? Authorizing Provider  azithromycin (ZITHROMAX) 250 MG tablet Take 1 tablet (250 mg total) by mouth daily. Take first 2 tablets together, then 1 every day until finished. 07/20/23  Yes Mickie Bail, NP  acetaminophen (TYLENOL) 500 MG tablet Take 500 mg by mouth every 6 (six) hours as needed.    [provider]  acyclovir ointment (ZOVIRAX) 5 % Apply 1 Application topically every 3 (three) hours. To cold sore as needed 08/28/22   Tower, Audrie Gallus, MD  albuterol (VENTOLIN HFA) 108 (90 Base)  MCG/ACT inhaler Inhale 1-2 puffs into the lungs every 6 (six) hours as needed. 04/25/23   Mickie Bail, NP  ALPRAZolam Prudy Feeler) 0.25 MG tablet Take 1 tablet (0.25 mg total) by mouth at bedtime as needed for anxiety. 12/11/22   Tower, Audrie Gallus, MD  buPROPion (WELLBUTRIN XL) 300 MG 24 hr tablet Take 1 tablet (300 mg total) by mouth daily. 02/04/23   Tower, Audrie Gallus, MD  Cetirizine HCl (ZYRTEC PO) Take 1 tablet by mouth daily. 03/13/20   [provider]  Cholecalciferol (VITAMIN D3 PO) Take 5,000 Units by mouth daily.    [provider]  ciclopirox (PENLAC) 8 % solution Apply topically daily. 05/08/21   [provider]  cyclobenzaprine (FLEXERIL) 10 MG tablet Take 0.5 tablets  (5 mg total) by mouth 3 (three) times daily as needed for muscle spasms. 12/11/22   Tower, Audrie Gallus, MD  ESOMEPRAZOLE MAGNESIUM PO Take 1 tablet by mouth daily. 02/11/20   [provider]  fluticasone (FLONASE) 50 MCG/ACT nasal spray PLACE 2 SPRAYS INTO THE NOSE DAILY AS NEEDED 04/03/18   Tower, Audrie Gallus, MD  furosemide (LASIX) 20 MG tablet Take 1 tablet (20 mg total) by mouth 2 (two) times a week. AND AS NEEDED 08/22/22   Dyann Kief, PA-C  loratadine (CLARITIN) 10 MG tablet Take 10 mg by mouth as needed for allergies. Reported on 05/19/2015    [provider]  meclizine (ANTIVERT) 25 MG tablet Take 1 tablet (25 mg total) by mouth as needed for dizziness. 12/11/22   Tower, Audrie Gallus, MD  metFORMIN (GLUCOPHAGE-XR) 500 MG 24 hr tablet Take 1 tablet (500 mg total) by mouth daily with breakfast. 02/04/23   Tower, Audrie Gallus, MD  metoprolol succinate (TOPROL-XL) 25 MG 24 hr tablet Take 1 tablet (25 mg total) by mouth daily. 08/20/22   Dyann Kief, PA-C  potassium chloride (KLOR-CON) 10 MEQ tablet Take 1 tablet (10 mEq total) by mouth daily. AND AS NEEDED WITH YOUR LASIX 08/20/22   Dyann Kief, PA-C  Probiotic Product (PROBIOTIC PO) Take 1 tablet by mouth daily.    [provider]  simvastatin (ZOCOR) 10 MG tablet Take 1 tablet (10 mg total) by mouth daily. 12/11/22   Tower, Audrie Gallus, MD    Family History Family History  Problem Relation Age of Onset   Graves' disease Mother    Depression Mother    Osteoporosis Mother    Heart attack Father        x 3    Breast cancer Sister    Hypertension Sister    Colon polyps Brother    Heart attack Brother        x 2   Diabetes Brother    Hypertension Brother    Coronary artery disease Brother        with stents    Atrial fibrillation Brother    Colon cancer Neg Hx    Esophageal cancer Neg Hx    Rectal cancer Neg Hx    Stomach cancer Neg Hx     Social History Social History   Tobacco Use   Smoking status: Never    Smokeless tobacco: Never  Vaping Use   Vaping status: Never Used  Substance Use Topics   Alcohol use: Yes    Alcohol/week: 0.0 standard drinks of alcohol    Comment: occ   Drug use: No     Allergies   Oxycodone-acetaminophen, Paroxetine, Paroxetine hcl, Sulfa antibiotics, Amoxicillin-pot clavulanate, Amoxicillin-pot clavulanate,  Calcium, Erythromycin, Guaifenesin, Naproxen, Oxycodone-acetaminophen, Oxycodone-acetaminophen, Oxycodone-aspirin, Oxycodone-aspirin, Sertraline hcl, Sertraline hcl, and Sertraline   Review of Systems Review of Systems  Constitutional:  Negative for chills and fever.  HENT:  Positive for congestion, postnasal drip, rhinorrhea and sinus pressure. Negative for ear pain and sore throat.   Respiratory:  Positive for cough. Negative for shortness of breath.      Physical Exam Triage Vital Signs ED Triage Vitals  Encounter Vitals Group     BP      Systolic BP Percentile      Diastolic BP Percentile      Pulse      Resp      Temp      Temp src      SpO2      Weight      Height      Head Circumference      Peak Flow      Pain Score      Pain Loc      Pain Education      Exclude from Growth Chart    No data found.  Updated Vital Signs BP 139/84   Pulse 65   Temp 98.2 F (36.8 C)   Resp 18   SpO2 98%   Visual Acuity Right Eye Distance:   Left Eye Distance:   Bilateral Distance:    Right Eye Near:   Left Eye Near:    Bilateral Near:     Physical Exam Constitutional:      General: She is not in acute distress. HENT:     Right Ear: Tympanic membrane normal.     Left Ear: Tympanic membrane normal.     Nose: Congestion and rhinorrhea present.     Comments: PND    Mouth/Throat:     Mouth: Mucous membranes are moist.     Pharynx: Oropharynx is clear.  Cardiovascular:     Rate and Rhythm: Normal rate and regular rhythm.     Heart sounds: Normal heart sounds.  Pulmonary:     Effort: Pulmonary effort is normal. No respiratory  distress.     Breath sounds: Normal breath sounds.  Neurological:     Mental Status: She is alert.      UC Treatments / Results  Labs (all labs ordered are listed, but only abnormal results are displayed) Labs Reviewed - No data to display  EKG   Radiology No results found.  Procedures Procedures (including critical care time)  Medications Ordered in UC Medications - No data to display  Initial Impression / Assessment and Plan / UC Course  I have reviewed the triage vital signs and the nursing notes.  Pertinent labs & imaging results that were available during my care of the patient were reviewed by me and considered in my medical decision making (see chart for details).    Acute upper respiratory infection and sinusitis.  Lungs are clear and O2 sat is 98% on room air.  Patient has been symptomatic for 2 weeks.  She is not improving with OTC treatment.  Treating today with Zithromax as patient reports this is worked well for her in the past.  She is allergic to erythromycin but is able to take Zithromax without difficulty.  Instructed her to follow-up with her PCP if her symptoms are not improving.  She agrees to plan of care.  Final Clinical Impressions(s) / UC Diagnoses   Final diagnoses:  Acute upper respiratory infection  Acute non-recurrent maxillary sinusitis  Discharge Instructions      Take the Zithromax as directed.  Follow-up with your primary care provider if your symptoms are not improving.      ED Prescriptions     Medication Sig Dispense Auth. Provider   azithromycin (ZITHROMAX) 250 MG tablet Take 1 tablet (250 mg total) by mouth daily. Take first 2 tablets together, then 1 every day until finished. 6 tablet Mickie Bail, NP      PDMP not reviewed this encounter.   Mickie Bail, NP 07/20/23 1300

## 2023-07-25 ENCOUNTER — Other Ambulatory Visit: Payer: Self-pay | Admitting: Family Medicine

## 2023-09-09 ENCOUNTER — Other Ambulatory Visit: Payer: Self-pay | Admitting: *Deleted

## 2023-09-09 NOTE — Telephone Encounter (Signed)
 Lvm sent mychart to schedule apt

## 2023-09-09 NOTE — Telephone Encounter (Signed)
 Pt is overdue for her DM f/u (was suppose to come back in Nov), please schedule and then route back to me to refill. thanks

## 2023-09-10 ENCOUNTER — Encounter: Payer: Self-pay | Admitting: Family Medicine

## 2023-09-11 MED ORDER — METFORMIN HCL ER 500 MG PO TB24: 500.0000 mg | ORAL_TABLET | Freq: Every day | ORAL | 0 refills | Status: DC

## 2023-09-11 NOTE — Telephone Encounter (Signed)
 Appt has been scheduled.

## 2023-09-22 ENCOUNTER — Encounter: Payer: Self-pay | Admitting: Internal Medicine

## 2023-09-24 ENCOUNTER — Other Ambulatory Visit: Payer: Self-pay | Admitting: Physician Assistant

## 2023-09-24 ENCOUNTER — Other Ambulatory Visit: Payer: Self-pay | Admitting: Family Medicine

## 2023-09-29 MED ORDER — METOPROLOL SUCCINATE ER 25 MG PO TB24
25.0000 mg | ORAL_TABLET | Freq: Every day | ORAL | 3 refills | Status: DC
Start: 2023-09-29 — End: 2024-03-30

## 2023-09-29 MED ORDER — FUROSEMIDE 20 MG PO TABS: ORAL_TABLET | ORAL | 3 refills | Status: DC

## 2023-09-30 ENCOUNTER — Encounter: Payer: Self-pay | Admitting: Family Medicine

## 2023-09-30 ENCOUNTER — Ambulatory Visit: Payer: Self-pay | Admitting: Family Medicine

## 2023-09-30 ENCOUNTER — Ambulatory Visit (INDEPENDENT_AMBULATORY_CARE_PROVIDER_SITE_OTHER): Admitting: Family Medicine

## 2023-09-30 VITALS — BP 134/80 | HR 60 | Temp 98.2°F | Ht 64.5 in | Wt 186.4 lb

## 2023-09-30 DIAGNOSIS — E7800 Pure hypercholesterolemia, unspecified: Secondary | ICD-10-CM

## 2023-09-30 DIAGNOSIS — T383X5A Adverse effect of insulin and oral hypoglycemic [antidiabetic] drugs, initial encounter: Secondary | ICD-10-CM | POA: Diagnosis not present

## 2023-09-30 DIAGNOSIS — R7303 Prediabetes: Secondary | ICD-10-CM

## 2023-09-30 DIAGNOSIS — D6851 Activated protein C resistance: Secondary | ICD-10-CM

## 2023-09-30 DIAGNOSIS — E66811 Obesity, class 1: Secondary | ICD-10-CM

## 2023-09-30 DIAGNOSIS — I10 Essential (primary) hypertension: Secondary | ICD-10-CM | POA: Diagnosis not present

## 2023-09-30 DIAGNOSIS — Z6831 Body mass index (BMI) 31.0-31.9, adult: Secondary | ICD-10-CM

## 2023-09-30 DIAGNOSIS — K2100 Gastro-esophageal reflux disease with esophagitis, without bleeding: Secondary | ICD-10-CM

## 2023-09-30 DIAGNOSIS — E6609 Other obesity due to excess calories: Secondary | ICD-10-CM

## 2023-09-30 DIAGNOSIS — F418 Other specified anxiety disorders: Secondary | ICD-10-CM

## 2023-09-30 LAB — LIPID PANEL
Cholesterol: 188 mg/dL (ref 0–200)
HDL: 79.1 mg/dL (ref >39.00)
LDL Cholesterol: 93 mg/dL (ref 0–99)
NonHDL: 108.51
Total CHOL/HDL Ratio: 2
Triglycerides: 80 mg/dL (ref 0.0–149.0)
VLDL: 16 mg/dL (ref 0.0–40.0)

## 2023-09-30 LAB — COMPREHENSIVE METABOLIC PANEL WITH GFR
ALT: 15 U/L (ref 0–35)
AST: 14 U/L (ref 0–37)
Albumin: 4.2 g/dL (ref 3.5–5.2)
Alkaline Phosphatase: 68 U/L (ref 39–117)
BUN: 17 mg/dL (ref 6–23)
CO2: 27 meq/L (ref 19–32)
Calcium: 9.7 mg/dL (ref 8.4–10.5)
Chloride: 100 meq/L (ref 96–112)
Creatinine, Ser: 0.72 mg/dL (ref 0.40–1.20)
GFR: 85.5 mL/min (ref >60.00)
Glucose, Bld: 108 mg/dL — ABNORMAL HIGH (ref 70–99)
Potassium: 4.3 meq/L (ref 3.5–5.1)
Sodium: 138 meq/L (ref 135–145)
Total Bilirubin: 0.6 mg/dL (ref 0.2–1.2)
Total Protein: 6.4 g/dL (ref 6.0–8.3)

## 2023-09-30 LAB — CBC WITH DIFFERENTIAL/PLATELET
Basophils Absolute: 0.1 10*3/uL (ref 0.0–0.1)
Basophils Relative: 1.1 % (ref 0.0–3.0)
Eosinophils Absolute: 0.2 10*3/uL (ref 0.0–0.7)
Eosinophils Relative: 3.1 % (ref 0.0–5.0)
HCT: 43.1 % (ref 36.0–46.0)
Hemoglobin: 14.2 g/dL (ref 12.0–15.0)
Lymphocytes Relative: 25.2 % (ref 12.0–46.0)
Lymphs Abs: 1.6 10*3/uL (ref 0.7–4.0)
MCHC: 32.8 g/dL (ref 30.0–36.0)
MCV: 85.1 fl (ref 78.0–100.0)
Monocytes Absolute: 0.5 10*3/uL (ref 0.1–1.0)
Monocytes Relative: 7.5 % (ref 3.0–12.0)
Neutro Abs: 4.1 10*3/uL (ref 1.4–7.7)
Neutrophils Relative %: 63.1 % (ref 43.0–77.0)
Platelets: 209 10*3/uL (ref 150.0–400.0)
RBC: 5.06 Mil/uL (ref 3.87–5.11)
RDW: 13.8 % (ref 11.5–15.5)
WBC: 6.5 10*3/uL (ref 4.0–10.5)

## 2023-09-30 LAB — VITAMIN B12: Vitamin B-12: 600 pg/mL (ref 211–911)

## 2023-09-30 LAB — HEMOGLOBIN A1C: Hgb A1c MFr Bld: 6.8 % — ABNORMAL HIGH (ref 4.6–6.5)

## 2023-09-30 LAB — TSH: TSH: 1.95 u[IU]/mL (ref 0.35–5.50)

## 2023-09-30 NOTE — Assessment & Plan Note (Signed)
 B12 added to labs

## 2023-09-30 NOTE — Assessment & Plan Note (Signed)
 Continues 81 mg daily asa

## 2023-09-30 NOTE — Patient Instructions (Addendum)
 Try to get most of your carbohydrates from produce (with the exception of white potatoes) and whole grains Eat less bread/pasta/rice/snack foods/cereals/sweets and other items from the middle of the grocery store (processed carbs)   [facility]   Labs today    Take care of yourself

## 2023-09-30 NOTE — Assessment & Plan Note (Signed)
 bp in fair control at this time  BP Readings from Last 1 Encounters:  09/30/23 134/80   No changes needed Most recent labs reviewed  Disc lifstyle change with low sodium diet and exercise  Continues metoprolol  xl 25 mg daily  Lasix  20 mg daily prn swelling  Klor con  Lab today

## 2023-09-30 NOTE — Assessment & Plan Note (Signed)
 Wellbutrin  xl 300 mg daily Recent grief Doing ok  Xanax  prn   Reviewed stressors/ coping techniques/symptoms/ support sources/ tx options and side effects in detail today

## 2023-09-30 NOTE — Assessment & Plan Note (Signed)
 Takes ppi infrequently

## 2023-09-30 NOTE — Assessment & Plan Note (Signed)
 A1c today  On metformin  xr 500 mg daily  Tolerating this  Diet is fair   disc imp of low glycemic diet and wt loss to prevent DM2

## 2023-09-30 NOTE — Progress Notes (Signed)
 Subjective:    Patient ID: Kristine [name], female    DOB: 1956/01/24, 68 y.o.   MRN: [id number]  HPI  Wt Readings from Last 3 Encounters:  09/30/23 186 lb 6 oz (84.5 kg)  01/24/23 193 lb (87.5 kg)  12/11/22 196 lb (88.9 kg)   31.50 kg/m  Vitals:   09/30/23 0953 09/30/23 1018  BP: (!) 148/82 134/80  Pulse: 60   Temp: 98.2 F (36.8 C)   SpO2: 98%     Pt presents for follow up of chronic medical problems including  HTN Prediabetes Hyperlipidemia Factor 5 mutation  Depression with anxiety   Feels a little off lately  Not sure why Hard to describe  Energy level goes up and down   Lost some weight - 10 lb since last summer  Had started metformin  then    HTN bp is stable today  No cp or palpitations or headaches or edema  No side effects to medicines  BP Readings from Last 3 Encounters:  09/30/23 134/80  07/20/23 139/84  04/25/23 (!) 141/82    Metoprolol  xl 25 mg daily  Lasix  20 mg daily with K (as needed - for swelling/ has lost weight and taking less)   Pulse Readings from Last 3 Encounters:  09/30/23 60  07/20/23 65  04/25/23 70   Takes asa 81 mg daily   Lab Results  Component Value Date   NA 139 12/04/2022   K 4.4 12/04/2022   CO2 28 12/04/2022   GLUCOSE 141 (H) 12/04/2022   BUN 18 12/04/2022   CREATININE 0.71 12/04/2022   CALCIUM 9.1 12/04/2022   GFR 87.45 12/04/2022   GFRNONAA 93 01/08/2018   Due for labs   Hyperlipidemia Lab Results  Component Value Date   CHOL 156 12/04/2022   HDL 58.60 12/04/2022   LDLCALC 80 12/04/2022   LDLDIRECT 146.1 08/19/2006   TRIG 87.0 12/04/2022   CHOLHDL 3 12/04/2022   Simvastatin  10 mg daily   Prediabetes Lab Results  Component Value Date   HGBA1C 6.6 (H) 12/04/2022   HGBA1C 6.5 12/04/2021   HGBA1C 6.2 10/19/2020   Taking metformin  xr 500 mg daily     Mood    09/30/2023   10:26 AM 12/11/2022    9:39 AM 10/21/2022    2:55 PM 12/05/2021    8:59 AM 10/18/2021    3:33 PM  Depression  screen PHQ 2/9  Decreased Interest 0 0 0 0 0  Down, Depressed, Hopeless 0 0 0 0 0  PHQ - 2 Score 0 0 0 0 0  Altered sleeping 1 1  2  0  Tired, decreased energy 1 1  1  0  Change in appetite 0 0  0 0  Feeling bad or failure about yourself  0 0  0 0  Trouble concentrating 0 0  0 0  Moving slowly or fidgety/restless 0 0  0 0  Suicidal thoughts 0 0  0 0  PHQ-9 Score 2 2  3  0  Difficult doing work/chores Not difficult at all Not difficult at all   Not difficult at all      09/30/2023   10:26 AM 12/11/2022    9:39 AM  GAD 7 : Generalized Anxiety Score  Nervous, Anxious, on Edge 0 0  Control/stop worrying 0 0  Worry too much - different things 0 0  Trouble relaxing 0 0  Restless 0 0  Easily annoyed or irritable 0 0  Afraid - awful might happen 0 0  Total GAD 7 Score 0 0  Anxiety Difficulty Not difficult at all Not difficult at all   Stressors Lost sister in nov unexpectedly (had alz /frontal lobe dementia) , died of PE  Then lost her SIL  Dealing with that  Not getting any counseling currently   Takes wellbutrin  xl 300 mg daily  Prn xanax        Patient Active Problem List   Diagnosis Date Noted   Adverse effect of metformin  09/30/2023   Hx of adenomatous polyp of colon 12/06/2022   Arm vein blood clot, right 12/26/2020   Estrogen deficiency 10/24/2020   History of pulmonary hypertension 06/08/2019   History of shingles 01/21/2018   Prediabetes 04/21/2015   Stress reaction 04/21/2015   Essential hypertension 04/21/2015   Migraine with aura 06/21/2013   Obesity 06/16/2011   Rhinitis 06/14/2011   Routine general medical examination at a health care facility 06/14/2011   SCIATICA 05/22/2009   GASTROESOPHAGEAL REFLUX DISEASE 04/14/2009   Factor 5 Leiden mutation, heterozygous (HCC) 12/07/2007   Depression with anxiety 11/25/2006   Esophagitis 11/25/2006   IBS 11/25/2006   DVT, HX OF 11/25/2006   MIGRAINES, HX OF 11/25/2006   HYPERCHOLESTEROLEMIA, PURE 10/29/2006    Past Medical History:  Diagnosis Date   Allergy    Anxiety    Basal cell cancer    Basal cell carcinoma    face   Clotting disorder (HCC)    Depression    DVT (deep venous thrombosis) (HCC) 05/13/2006   Esophageal erosions    Factor V Leiden (HCC)    On coag x 1 year ending in 09   Gastritis 05/13/1988   hospital- abd pain   GERD (gastroesophageal reflux disease)    Hx of adenomatous polyp of colon 12/06/2022   5 mm rectal adenoma repeat colonoscopy 2031    Hyperlipidemia    Hypertension    Radial head fracture 05/13/2009   Past Surgical History:  Procedure Laterality Date   BIOPSY BREAST Right 10/2022   CHOLECYSTECTOMY     COLONOSCOPY     DILATION AND CURETTAGE OF UTERUS  08/22/2022   MOHS SURGERY  11/10/2009   basal cell carcinoma near eye   OOPHORECTOMY     SHOULDER SURGERY  04/12/2006   TONSILLECTOMY  05/13/1974   UPPER GASTROINTESTINAL ENDOSCOPY     Social History   Tobacco Use   Smoking status: Never   Smokeless tobacco: Never  Vaping Use   Vaping status: Never Used  Substance Use Topics   Alcohol use: Yes    Alcohol/week: 0.0 standard drinks of alcohol    Comment: occ   Drug use: No   Family History  Problem Relation Age of Onset   Graves' disease Mother    Depression Mother    Osteoporosis Mother    Heart attack Father        x 3    Breast cancer Sister    Hypertension Sister    Colon polyps Brother    Heart attack Brother        x 2   Diabetes Brother    Hypertension Brother    Coronary artery disease Brother        with stents    Atrial fibrillation Brother    Colon cancer Neg Hx    Esophageal cancer Neg Hx    Rectal cancer Neg Hx    Stomach cancer Neg Hx    Allergies  Allergen Reactions   Oxycodone-Acetaminophen Rash and Other (See Comments)  Paroxetine Hives   Paroxetine Hcl Hives   Sulfa Antibiotics Other (See Comments)   Amoxicillin-Pot Clavulanate     REACTION: GI   Amoxicillin-Pot Clavulanate Other (See Comments)     REACTION: GI   Calcium     REACTION: muscle aches   Erythromycin Other (See Comments)    REACTION: GI   Guaifenesin     REACTION: GI   Naproxen     REACTION: GI   Oxycodone-Acetaminophen     REACTION: reaction not known   Oxycodone-Acetaminophen Other (See Comments)    Other Reaction: Not Assessed   Oxycodone-Aspirin     REACTION: reaction not known   Oxycodone-Aspirin     REACTION: reaction not known    Sertraline Hcl     REACTION: hives   Sertraline Hcl    Sertraline Rash and Other (See Comments)    REACTION: hives   Current Outpatient Medications on File Prior to Visit  Medication Sig Dispense Refill   acetaminophen (TYLENOL) 500 MG tablet Take 500 mg by mouth every 6 (six) hours as needed.     acyclovir  ointment (ZOVIRAX ) 5 % Apply 1 Application topically every 3 (three) hours. To cold sore as needed 5 g 3   albuterol  (VENTOLIN  HFA) 108 (90 Base) MCG/ACT inhaler Inhale 1-2 puffs into the lungs every 6 (six) hours as needed. 18 g 0   ALPRAZolam  (XANAX ) 0.25 MG tablet Take 1 tablet (0.25 mg total) by mouth at bedtime as needed for anxiety. 30 tablet 0   aspirin EC 81 MG tablet Take 81 mg by mouth daily. Swallow whole.     buPROPion  (WELLBUTRIN  XL) 300 MG 24 hr tablet TAKE 1 TABLET BY MOUTH EVERY DAY 90 tablet 1   Cetirizine HCl (ZYRTEC PO) Take 1 tablet by mouth daily.     Cholecalciferol (VITAMIN D3 PO) Take 5,000 Units by mouth daily.     ciclopirox (PENLAC) 8 % solution Apply topically daily.     cyclobenzaprine  (FLEXERIL ) 10 MG tablet Take 0.5 tablets (5 mg total) by mouth 3 (three) times daily as needed for muscle spasms. 30 tablet 1   ESOMEPRAZOLE  MAGNESIUM  PO Take 1 tablet by mouth daily as needed.     fluticasone  (FLONASE ) 50 MCG/ACT nasal spray PLACE 2 SPRAYS INTO THE NOSE DAILY AS NEEDED 48 g 3   furosemide  (LASIX ) 20 MG tablet Twice weekly and as needed 90 tablet 3   loratadine (CLARITIN) 10 MG tablet Take 10 mg by mouth as needed for allergies. Reported on  05/19/2015     meclizine  (ANTIVERT ) 25 MG tablet Take 1 tablet (25 mg total) by mouth as needed for dizziness. 30 tablet 1   metFORMIN  (GLUCOPHAGE -XR) 500 MG 24 hr tablet Take 1 tablet (500 mg total) by mouth daily with breakfast. 90 tablet 0   metoprolol  succinate (TOPROL -XL) 25 MG 24 hr tablet Take 1 tablet (25 mg total) by mouth daily. Take with or immediately following a meal. 90 tablet 3   potassium chloride  (KLOR-CON ) 10 MEQ tablet Take 1 tablet (10 mEq total) by mouth daily. AND AS NEEDED WITH YOUR LASIX  90 tablet 3   Probiotic Product (PROBIOTIC PO) Take 1 tablet by mouth daily.     simvastatin  (ZOCOR ) 10 MG tablet TAKE 1 TABLET BY MOUTH EVERY DAY 90 tablet 2   No current facility-administered medications on file prior to visit.    Review of Systems  Constitutional:  Positive for appetite change and fatigue. Negative for activity change, fever and unexpected weight change.  HENT:  Negative for congestion, ear pain, rhinorrhea, sinus pressure and sore throat.   Eyes:  Negative for pain, redness and visual disturbance.  Respiratory:  Negative for cough, shortness of breath and wheezing.   Cardiovascular:  Negative for chest pain and palpitations.  Gastrointestinal:  Negative for abdominal pain, blood in stool, constipation and diarrhea.  Endocrine: Negative for polydipsia and polyuria.  Genitourinary:  Negative for dysuria, frequency and urgency.  Musculoskeletal:  Negative for arthralgias, back pain and myalgias.  Skin:  Negative for pallor and rash.  Allergic/Immunologic: Negative for environmental allergies.  Neurological:  Negative for dizziness, syncope and headaches.  Hematological:  Negative for adenopathy. Does not bruise/bleed easily.  Psychiatric/Behavioral:  Negative for decreased concentration and dysphoric mood. The patient is not nervous/anxious.        Grief       Objective:   Physical Exam Constitutional:      General: She is not in acute distress.    Appearance:  Normal appearance. She is well-developed. She is obese. She is not ill-appearing or diaphoretic.  HENT:     Head: Normocephalic and atraumatic.  Eyes:     Conjunctiva/sclera: Conjunctivae normal.     Pupils: Pupils are equal, round, and reactive to light.  Neck:     Thyroid : No thyromegaly.     Vascular: No carotid bruit or JVD.  Cardiovascular:     Rate and Rhythm: Normal rate and regular rhythm.     Heart sounds: Normal heart sounds.     No gallop.  Pulmonary:     Effort: Pulmonary effort is normal. No respiratory distress.     Breath sounds: Normal breath sounds. No wheezing or rales.  Abdominal:     General: There is no distension or abdominal bruit.     Palpations: Abdomen is soft.  Musculoskeletal:     Cervical back: Normal range of motion and neck supple.     Right lower leg: No edema.     Left lower leg: No edema.  Lymphadenopathy:     Cervical: No cervical adenopathy.  Skin:    General: Skin is warm and dry.     Coloration: Skin is not pale.     Findings: No rash.     Comments: Some facial redness from topical derm chemo recently  Neurological:     Mental Status: She is alert.     Coordination: Coordination normal.     Deep Tendon Reflexes: Reflexes are normal and symmetric. Reflexes normal.  Psychiatric:        Mood and Affect: Mood normal.           Assessment & Plan:   Problem List Items Addressed This Visit       Cardiovascular and Mediastinum   Essential hypertension   bp in fair control at this time  BP Readings from Last 1 Encounters:  09/30/23 134/80   No changes needed Most recent labs reviewed  Disc lifstyle change with low sodium diet and exercise  Continues metoprolol  xl 25 mg daily  Lasix  20 mg daily prn swelling  Klor con  Lab today       Relevant Medications   aspirin EC 81 MG tablet   Other Relevant Orders   TSH   Lipid Panel   Comprehensive metabolic panel with GFR   CBC with Differential/Platelet     Digestive    GASTROESOPHAGEAL REFLUX DISEASE   Takes ppi infrequently         Hematopoietic and Hemostatic  Factor 5 Leiden mutation, heterozygous (HCC)   Continues 81 mg daily asa         Other   Prediabetes - Primary   A1c today  On metformin  xr 500 mg daily  Tolerating this  Diet is fair   disc imp of low glycemic diet and wt loss to prevent DM2       Relevant Orders   Hemoglobin A1c   Obesity   Discussed how this problem influences overall health and the risks it imposes  Reviewed plan for weight loss with lower calorie diet (via better food choices (lower glycemic and portion control) along with exercise building up to or more than 30 minutes 5 days per week including some aerobic activity and strength training         HYPERCHOLESTEROLEMIA, PURE   Disc goals for lipids and reasons to control them Rev last labs with pt Rev low sat fat diet in detail Labs pending today  Simvastatin  10 mg daily -tolerating well  Last calcium score was 0 Very good diet       Relevant Medications   aspirin EC 81 MG tablet   Other Relevant Orders   Lipid Panel   Comprehensive metabolic panel with GFR   Depression with anxiety   Wellbutrin  xl 300 mg daily Recent grief Doing ok  Xanax  prn   Reviewed stressors/ coping techniques/symptoms/ support sources/ tx options and side effects in detail today       Adverse effect of metformin    B12 added to labs       Relevant Orders   Vitamin B12

## 2023-09-30 NOTE — Assessment & Plan Note (Signed)
 Disc goals for lipids and reasons to control them Rev last labs with pt Rev low sat fat diet in detail Labs pending today  Simvastatin  10 mg daily -tolerating well  Last calcium score was 0 Very good diet

## 2023-09-30 NOTE — Assessment & Plan Note (Signed)
 Discussed how this problem influences overall health and the risks it imposes  Reviewed plan for weight loss with lower calorie diet (via better food choices (lower glycemic and portion control) along with exercise building up to or more than 30 minutes 5 days per week including some aerobic activity and strength training

## 2023-10-01 ENCOUNTER — Encounter: Payer: Self-pay | Admitting: Obstetrics and Gynecology

## 2023-10-01 MED ORDER — METFORMIN HCL ER 500 MG PO TB24: 1000.0000 mg | ORAL_TABLET | Freq: Every day | ORAL | 1 refills | Status: DC

## 2023-10-22 ENCOUNTER — Ambulatory Visit (INDEPENDENT_AMBULATORY_CARE_PROVIDER_SITE_OTHER): Payer: Medicare Other

## 2023-10-22 VITALS — Ht 64.5 in | Wt 186.0 lb

## 2023-10-22 DIAGNOSIS — Z0000 Encounter for general adult medical examination without abnormal findings: Secondary | ICD-10-CM | POA: Diagnosis not present

## 2023-10-22 NOTE — Patient Instructions (Signed)
 Kristine [name] , Thank you for taking time out of your busy schedule to complete your Annual Wellness Visit with me. I enjoyed our conversation and look forward to speaking with you again next year. I, as well as your care team,  appreciate your ongoing commitment to your health goals. Please review the following plan we discussed and let me know if I can assist you in the future. Your Game plan/ To Do List     Follow up Visits: Next Medicare AWV with our clinical staff: 10/22/24 @ @2 :20pm   Have you seen your provider in the last 6 months (3 months if uncontrolled diabetes)? Yes Next Office Visit with your provider: 12/23/23  Clinician Recommendations:  Aim for 30 minutes of exercise or brisk walking, 6-8 glasses of water, and 5 servings of fruits and vegetables each day.       This is a list of the screening recommended for you and due dates:  Health Maintenance  Topic Date Due   COVID-19 Vaccine (7 - 2024-25 season) 10/15/2025*   Flu Shot  12/12/2023   Mammogram  10/10/2024   Medicare Annual Wellness Visit  10/21/2024   DTaP/Tdap/Td vaccine (4 - Td or Tdap) 04/03/2028   Colon Cancer Screening  12/05/2029   Pneumonia Vaccine  Completed   DEXA scan (bone density measurement)  Completed   Hepatitis C Screening  Completed   Zoster (Shingles) Vaccine  Completed   HPV Vaccine  Aged Out   Meningitis B Vaccine  Aged Out   Cologuard (Stool DNA test)  Discontinued  *Topic was postponed. The date shown is not the original due date.    Advanced directives: (Copy Requested) Please bring a copy of your health care power of attorney and living will to the office to be added to your chart at your convenience. You can mail to [facility] [address] [place], [place] [address] or email to [email or web address] Advance Care Planning is important because it:  [x]  Makes sure you receive the medical care that is consistent with your values, goals, and preferences  [x]  It  provides guidance to your family and loved ones and reduces their decisional burden about whether or not they are making the right decisions based on your wishes.  Follow the link provided in your after visit summary or read over the paperwork we have mailed to you to help you started getting your Advance Directives in place. If you need assistance in completing these, please reach out to us  so that we can help you!

## 2023-10-22 NOTE — Progress Notes (Signed)
 Subjective:   Kristine [name] is a 68 y.o. who presents for a Medicare Wellness preventive visit.  As a reminder, Annual Wellness Visits don't include a physical exam, and some assessments may be limited, especially if this visit is performed virtually. We may recommend an in-person follow-up visit with your provider if needed.  Visit Complete: Virtual I connected with  Kristine [name] on 10/22/23 by a audio enabled telemedicine application and verified that I am speaking with the correct person using two identifiers.  Patient Location: Home  Provider Location: Office/Clinic  I discussed the limitations of evaluation and management by telemedicine. The patient expressed understanding and agreed to proceed.  Vital Signs: Because this visit was a virtual/telehealth visit, some criteria may be missing or patient reported. Any vitals not documented were not able to be obtained and vitals that have been documented are patient reported.  VideoDeclined- This patient declined Librarian, academic. Therefore the visit was completed with audio only.  Persons Participating in Visit: Patient.  AWV Questionnaire: Yes: Patient Medicare AWV questionnaire was completed by the patient on 10/20/23; I have confirmed that all information answered by patient is correct and no changes since this date.  Cardiac Risk Factors include: advanced age (>49men, >31 women);dyslipidemia;hypertension;obesity (BMI >30kg/m2)     Objective:     Today's Vitals   10/20/23 0922 10/22/23 1421  Weight:  186 lb (84.4 kg)  Height:  5' 4.5 (1.638 m)  PainSc: 3     Body mass index is 31.43 kg/m.     10/22/2023    2:37 PM 07/20/2023   12:35 PM 04/25/2023   12:22 PM 10/21/2022    2:57 PM 10/18/2021    3:35 PM 10/17/2020    3:33 PM  Advanced Directives  Does Patient Have a Medical Advance Directive? Yes No No Yes No Yes  Type of Estate agent of [place];Living will    Healthcare Power of [place];Living will  Healthcare Power of [place];Living will  Copy of Healthcare Power of Attorney in Chart? No - copy requested   No - copy requested  No - copy requested  Would patient like information on creating a medical advance directive?     No - Patient declined     Current Medications (verified) Outpatient Encounter Medications as of 10/22/2023  Medication Sig   acetaminophen (TYLENOL) 500 MG tablet Take 500 mg by mouth every 6 (six) hours as needed.   acyclovir  ointment (ZOVIRAX ) 5 % Apply 1 Application topically every 3 (three) hours. To cold sore as needed   albuterol  (VENTOLIN  HFA) 108 (90 Base) MCG/ACT inhaler Inhale 1-2 puffs into the lungs every 6 (six) hours as needed.   ALPRAZolam  (XANAX ) 0.25 MG tablet Take 1 tablet (0.25 mg total) by mouth at bedtime as needed for anxiety.   aspirin EC 81 MG tablet Take 81 mg by mouth daily. Swallow whole.   buPROPion  (WELLBUTRIN  XL) 300 MG 24 hr tablet TAKE 1 TABLET BY MOUTH EVERY DAY   Cetirizine HCl (ZYRTEC PO) Take 1 tablet by mouth daily.   Cholecalciferol (VITAMIN D3 PO) Take 5,000 Units by mouth daily.   ciclopirox (PENLAC) 8 % solution Apply topically daily.   cyclobenzaprine  (FLEXERIL ) 10 MG tablet Take 0.5 tablets (5 mg total) by mouth 3 (three) times daily as needed for muscle spasms.   ESOMEPRAZOLE  MAGNESIUM  PO Take 1 tablet by mouth daily as needed.   fluticasone  (FLONASE ) 50 MCG/ACT nasal spray PLACE 2 SPRAYS INTO THE NOSE  DAILY AS NEEDED   furosemide  (LASIX ) 20 MG tablet Twice weekly and as needed   loratadine (CLARITIN) 10 MG tablet Take 10 mg by mouth as needed for allergies. Reported on 05/19/2015   meclizine  (ANTIVERT ) 25 MG tablet Take 1 tablet (25 mg total) by mouth as needed for dizziness.   metFORMIN  (GLUCOPHAGE -XR) 500 MG 24 hr tablet Take 2 tablets (1,000 mg total) by mouth daily with breakfast.   metoprolol  succinate (TOPROL -XL) 25 MG 24 hr tablet Take 1 tablet (25 mg total) by mouth daily.  Take with or immediately following a meal.   potassium chloride  (KLOR-CON ) 10 MEQ tablet Take 1 tablet (10 mEq total) by mouth daily. AND AS NEEDED WITH YOUR LASIX    Probiotic Product (PROBIOTIC PO) Take 1 tablet by mouth daily.   simvastatin  (ZOCOR ) 10 MG tablet TAKE 1 TABLET BY MOUTH EVERY DAY   No facility-administered encounter medications on file as of 10/22/2023.    Allergies (verified) Oxycodone-acetaminophen, Paroxetine, Paroxetine hcl, Sulfa antibiotics, Amoxicillin-pot clavulanate, Amoxicillin-pot clavulanate, Calcium, Erythromycin, Guaifenesin, Naproxen, Oxycodone-acetaminophen, Oxycodone-acetaminophen, Oxycodone-aspirin, Oxycodone-aspirin, Sertraline hcl, Sertraline hcl, and Sertraline   History: Past Medical History:  Diagnosis Date   Allergy    Anxiety    Basal cell cancer    Basal cell carcinoma    face   Clotting disorder (HCC)    Depression    DVT (deep venous thrombosis) (HCC) 05/13/2006   Esophageal erosions    Factor V Leiden (HCC)    On coag x 1 year ending in 09   Gastritis 05/13/1988   hospital- abd pain   GERD (gastroesophageal reflux disease)    Hx of adenomatous polyp of colon 12/06/2022   5 mm rectal adenoma repeat colonoscopy 2031    Hyperlipidemia    Hypertension    Radial head fracture 05/13/2009   Past Surgical History:  Procedure Laterality Date   BIOPSY BREAST Right 10/2022   CHOLECYSTECTOMY     COLONOSCOPY     DILATION AND CURETTAGE OF UTERUS  08/22/2022   MOHS SURGERY  11/10/2009   basal cell carcinoma near eye   OOPHORECTOMY     SHOULDER SURGERY  04/12/2006   TONSILLECTOMY  05/13/1974   UPPER GASTROINTESTINAL ENDOSCOPY     Family History  Problem Relation Age of Onset   Graves' disease Mother    Depression Mother    Osteoporosis Mother    Heart attack Father        x 3    Breast cancer Sister    Hypertension Sister    Colon polyps Brother    Heart attack Brother        x 2   Diabetes Brother    Hypertension Brother     Coronary artery disease Brother        with stents    Atrial fibrillation Brother    Colon cancer Neg Hx    Esophageal cancer Neg Hx    Rectal cancer Neg Hx    Stomach cancer Neg Hx    Social History   Socioeconomic History   Marital status: Married    Spouse name: Not on file   Number of children: 1   Years of education: Not on file   Highest education level: Associate degree: occupational, Scientist, product/process development, or vocational program  Occupational History   Occupation: Producer, television/film/video: [employer]  Tobacco Use   Smoking status: Never   Smokeless tobacco: Never  Vaping Use   Vaping status: Never Used  Substance and  Sexual Activity   Alcohol use: Yes    Alcohol/week: 0.0 standard drinks of alcohol    Comment: occ   Drug use: No   Sexual activity: Not on file  Other Topics Concern   Not on file  Social History Narrative   Some walking for exercise         Hematology - Dr. Freida Jes - Dr. Ira Mann   [facility]- [facility] Dr. Bud Care   Social Drivers of Health   Financial Resource Strain: Low Risk  (10/22/2023)   Overall Financial Resource Strain (CARDIA)    Difficulty of Paying Living Expenses: Not hard at all  Food Insecurity: No Food Insecurity (10/22/2023)   Hunger Vital Sign    Worried About Running Out of Food in the Last Year: Never true    Ran Out of Food in the Last Year: Never true  Transportation Needs: No Transportation Needs (10/22/2023)   PRAPARE - Administrator, Civil Service (Medical): No    Lack of Transportation (Non-Medical): No  Physical Activity: Sufficiently Active (10/22/2023)   Exercise Vital Sign    Days of Exercise per Week: 3 days    Minutes of Exercise per Session: 50 min  Stress: No Stress Concern Present (10/22/2023)   [employer] of Occupational Health - Occupational Stress Questionnaire    Feeling of Stress : Only a little  Social Connections: Moderately Integrated (10/22/2023)   Social Connection and Isolation  Panel [NHANES]    Frequency of Communication with Friends and Family: More than three times a week    Frequency of Social Gatherings with Friends and Family: Twice a week    Attends Religious Services: More than 4 times per year    Active Member of [employer] or Organizations: No    Attends Engineer, structural: Never    Marital Status: Married    Tobacco Counseling Counseling given: Not Answered    Clinical Intake:  Pre-visit preparation completed: Yes  Pain : 0-10 Pain Score: 3  Pain Type: Chronic pain Pain Location: Knee (both knees and LBP) Pain Descriptors / Indicators: Aching Pain Onset: More than a month ago Pain Frequency: Intermittent Pain Relieving Factors: stretching and heat/ice  Pain Relieving Factors: stretching and heat/ice  BMI - recorded: 31.43 Nutritional Status: BMI > 30  Obese Nutritional Risks: None Diabetes: No  Lab Results  Component Value Date   HGBA1C 6.8 (H) 09/30/2023   HGBA1C 6.6 (H) 12/04/2022   HGBA1C 6.5 12/04/2021     How often do you need to have someone help you when you read instructions, pamphlets, or other written materials from your doctor or pharmacy?: 1 - Never  Interpreter Needed?: No  Comments: lives with husband Information entered by :: B.Riyan Haile,LPN   Activities of Daily Living     10/20/2023    9:22 AM  In your present state of health, do you have any difficulty performing the following activities:  Hearing? 0  Vision? 0  Difficulty concentrating or making decisions? 0  Walking or climbing stairs? 0  Dressing or bathing? 0  Doing errands, shopping? 0  Preparing Food and eating ? N  Using the Toilet? N  In the past six months, have you accidently leaked urine? Y  Do you have problems with loss of bowel control? N  Managing your Medications? N  Managing your Finances? N  Housekeeping or managing your Housekeeping? N    Patient Care Team: [place], Manley Seeds, MD as PCP - Lavelle Posey,  Delene Feinstein, MD as PCP -  Cardiology (Cardiology) Thurman Flores, MD as Consulting Physician (Obstetrics and Gynecology)  I have updated your Care Teams any recent Medical Services you may have received from other providers in the past year.     Assessment:    This is a routine wellness examination for Kristine [name].  Hearing/Vision screen Hearing Screening - Comments:: Pt says her hearing is good Vision Screening - Comments:: Pt says her vision is good w/readers  Annette Barters [place]   Goals Addressed             This Visit's Progress    DIET - EAT MORE FRUITS AND VEGETABLES   On track    10/22/23     Patient Stated   On track    10/22/23, I will continue to walk 4 days a week for 2 miles.     Patient Stated   Not on track    10/22/23-Lose weight       Depression Screen     10/22/2023    2:29 PM 09/30/2023   10:26 AM 12/11/2022    9:39 AM 10/21/2022    2:55 PM 12/05/2021    8:59 AM 10/18/2021    3:33 PM 10/17/2020    3:35 PM  PHQ 2/9 Scores  PHQ - 2 Score 0 0 0 0 0 0 0  PHQ- 9 Score  2 2  3  0 0    Fall Risk     10/20/2023    9:22 AM 09/30/2023   10:26 AM 12/11/2022    9:38 AM 10/21/2022    2:58 PM 10/17/2022    5:26 PM  Fall Risk   Falls in the past year? 0 0 0 0 0  Number falls in past yr: 0 0 0 0 0  Injury with Fall? 0 0 0 0 0  Risk for fall due to : No Fall Risks No Fall Risks No Fall Risks No Fall Risks   Follow up Education provided;Falls prevention discussed Falls evaluation completed Falls evaluation completed Falls prevention discussed;Falls evaluation completed     MEDICARE RISK AT HOME:  Medicare Risk at Home Any stairs in or around the home?: (Patient-Rptd) Yes If so, are there any without handrails?: (Patient-Rptd) No Home free of loose throw rugs in walkways, pet beds, electrical cords, etc?: (Patient-Rptd) Yes Adequate lighting in your home to reduce risk of falls?: (Patient-Rptd) Yes Life alert?: (Patient-Rptd) No Use of a cane, walker or w/c?: (Patient-Rptd) No Grab  bars in the bathroom?: (Patient-Rptd) No Shower chair or bench in shower?: (Patient-Rptd) Yes Elevated toilet seat or a handicapped toilet?: (Patient-Rptd) Yes  TIMED UP AND GO:  Was the test performed?  No  Cognitive Function: 6CIT completed    10/17/2020    3:46 PM  MMSE - Mini Mental State Exam  Orientation to time 5  Orientation to Place 5  Registration 3  Recall 3  Language- repeat 1        10/22/2023    2:39 PM 10/21/2022    3:02 PM 10/18/2021    3:37 PM  6CIT Screen  What Year? 0 points 0 points 0 points  What month? 0 points 0 points 0 points  What time? 0 points 0 points 0 points  Count back from 20 0 points 0 points 0 points  Months in reverse 0 points 0 points 0 points  Repeat phrase 0 points 0 points 0 points  Total Score 0 points 0 points 0 points    Immunizations  Immunization History  Administered Date(s) Administered   Fluad Quad(high Dose 65+) 03/01/2021   Hepatitis B 05/13/1994   Influenza Split 03/21/2011, 02/03/2013, 02/22/2014   Influenza Whole 02/21/2005, 03/02/2007, 02/09/2009, 03/22/2010   Influenza, High Dose Seasonal PF 03/22/2023   Influenza,inj,Quad PF,6+ Mos 03/29/2015, 03/05/2017, 02/19/2018, 03/02/2019   Influenza-Unspecified 03/28/2015   PFIZER(Purple Top)SARS-COV-2 Vaccination 07/10/2019, 07/30/2019, 02/23/2020   PNEUMOCOCCAL CONJUGATE-20 10/24/2020   Pneumococcal Polysaccharide-23 01/24/2003, 12/07/2007   Pneumococcal-Unspecified 05/13/2013, 10/24/2020   Td 12/07/2007   Tdap 05/13/2013, 04/03/2018   Unspecified SARS-COV-2 Vaccination 07/10/2019, 07/30/2019, 02/22/2020   Zoster Recombinant(Shingrix) 01/29/2023, 07/11/2023   Zoster, Live 03/07/2016    Screening Tests Health Maintenance  Topic Date Due   COVID-19 Vaccine (7 - 2024-25 season) 10/15/2025 (Originally 01/12/2023)   INFLUENZA VACCINE  12/12/2023   MAMMOGRAM  10/10/2024   Medicare Annual Wellness (AWV)  10/21/2024   DTaP/Tdap/Td (4 - Td or Tdap) 04/03/2028   Colonoscopy   12/05/2029   Pneumonia Vaccine 33+ Years old  Completed   DEXA SCAN  Completed   Hepatitis C Screening  Completed   Zoster Vaccines- Shingrix  Completed   HPV VACCINES  Aged Out   Meningococcal B Vaccine  Aged Out   Fecal DNA (Cologuard)  Discontinued    Health Maintenance  There are no preventive care reminders to display for this patient.  Health Maintenance Items Addressed: None at this time  Additional Screening:  Vision Screening: Recommended annual ophthalmology exams for early detection of glaucoma and other disorders of the eye. Would you like a referral to an eye doctor? No    Dental Screening: Recommended annual dental exams for proper oral hygiene  Community Resource Referral / Chronic Care Management: CRR required this visit?  No   CCM required this visit?  Appt scheduled with PCP   Plan:    I have personally reviewed and noted the following in the patient's chart:   Medical and social history Use of alcohol, tobacco or illicit drugs  Current medications and supplements including opioid prescriptions. Patient is not currently taking opioid prescriptions. Functional ability and status Nutritional status Physical activity Advanced directives List of other physicians Hospitalizations, surgeries, and ER visits in previous 12 months Vitals Screenings to include cognitive, depression, and falls Referrals and appointments  In addition, I have reviewed and discussed with patient certain preventive protocols, quality metrics, and best practice recommendations. A written personalized care plan for preventive services as well as general preventive health recommendations were provided to patient.   Nerissa Bannister, LPN   08/19/8117   After Visit Summary: (MyChart) Due to this being a telephonic visit, the after visit summary with patients personalized plan was offered to patient via MyChart   Notes: Nothing significant to report at this time.

## 2023-11-10 LAB — HM MAMMOGRAPHY

## 2023-12-04 ENCOUNTER — Other Ambulatory Visit: Payer: Self-pay | Admitting: Family Medicine

## 2023-12-23 ENCOUNTER — Ambulatory Visit (INDEPENDENT_AMBULATORY_CARE_PROVIDER_SITE_OTHER): Admitting: Family Medicine

## 2023-12-23 ENCOUNTER — Encounter: Payer: Self-pay | Admitting: Family Medicine

## 2023-12-23 VITALS — BP 118/78 | HR 61 | Temp 98.0°F | Ht 64.5 in | Wt 181.4 lb

## 2023-12-23 DIAGNOSIS — E7800 Pure hypercholesterolemia, unspecified: Secondary | ICD-10-CM

## 2023-12-23 DIAGNOSIS — M545 Low back pain, unspecified: Secondary | ICD-10-CM

## 2023-12-23 DIAGNOSIS — K2100 Gastro-esophageal reflux disease with esophagitis, without bleeding: Secondary | ICD-10-CM

## 2023-12-23 DIAGNOSIS — G8929 Other chronic pain: Secondary | ICD-10-CM

## 2023-12-23 DIAGNOSIS — R7303 Prediabetes: Secondary | ICD-10-CM

## 2023-12-23 DIAGNOSIS — K580 Irritable bowel syndrome with diarrhea: Secondary | ICD-10-CM

## 2023-12-23 DIAGNOSIS — D6851 Activated protein C resistance: Secondary | ICD-10-CM

## 2023-12-23 DIAGNOSIS — I10 Essential (primary) hypertension: Secondary | ICD-10-CM

## 2023-12-23 DIAGNOSIS — E6609 Other obesity due to excess calories: Secondary | ICD-10-CM

## 2023-12-23 DIAGNOSIS — F418 Other specified anxiety disorders: Secondary | ICD-10-CM

## 2023-12-23 DIAGNOSIS — E66811 Obesity, class 1: Secondary | ICD-10-CM

## 2023-12-23 DIAGNOSIS — Z6830 Body mass index (BMI) 30.0-30.9, adult: Secondary | ICD-10-CM

## 2023-12-23 DIAGNOSIS — M549 Dorsalgia, unspecified: Secondary | ICD-10-CM | POA: Insufficient documentation

## 2023-12-23 LAB — POCT GLYCOSYLATED HEMOGLOBIN (HGB A1C): Hemoglobin A1C: 5.8 % — AB (ref 4.0–5.6)

## 2023-12-23 MED ORDER — CYCLOBENZAPRINE HCL 10 MG PO TABS: 5.0000 mg | ORAL_TABLET | Freq: Three times a day (TID) | ORAL | 2 refills | Status: DC | PRN

## 2023-12-23 MED ORDER — BUPROPION HCL ER (XL) 300 MG PO TB24: 300.0000 mg | ORAL_TABLET | Freq: Every day | ORAL | 3 refills | Status: AC

## 2023-12-23 MED ORDER — MECLIZINE HCL 25 MG PO TABS: 25.0000 mg | ORAL_TABLET | Freq: Two times a day (BID) | ORAL | 0 refills | Status: DC | PRN

## 2023-12-23 NOTE — Assessment & Plan Note (Signed)
 Knee problems exacerbate this  Takes cyclobenzaprine  prn -sent in  Caution of sedation

## 2023-12-23 NOTE — Assessment & Plan Note (Signed)
 Disc goals for lipids and reasons to control them Rev last labs with pt Rev low sat fat diet in detail Continues simvastatin  10 mg daily  LDL of 93 HDL up to almost 80- good

## 2023-12-23 NOTE — Assessment & Plan Note (Signed)
 Discussed how this problem influences overall health and the risks it imposes  Reviewed plan for weight loss with lower calorie diet (via better food choices (lower glycemic and portion control) along with exercise building up to or more than 30 minutes 5 days per week including some aerobic activity and strength training   Commended on weight loss so far Also taking metformin 

## 2023-12-23 NOTE — Assessment & Plan Note (Signed)
 Diarrhea may be worsened by metformin   Encouraged trial of fiber supplement  If intolerable may need to stop metformin 

## 2023-12-23 NOTE — Assessment & Plan Note (Signed)
 Lab Results  Component Value Date   HGBA1C 5.8 (A) 12/23/2023   HGBA1C 6.8 (H) 09/30/2023   HGBA1C 6.6 (H) 12/04/2022   Encouraged to continue metformin  xr 500 mg if tolerated  Also low glycemic diet and exercise  Commended

## 2023-12-23 NOTE — Assessment & Plan Note (Signed)
 Continues 81 mg daily asa

## 2023-12-23 NOTE — Assessment & Plan Note (Signed)
 bp in fair control at this time  BP Readings from Last 1 Encounters:  12/23/23 118/78   No changes needed Most recent labs reviewed  Disc lifstyle change with low sodium diet and exercise  Continues metoprolol  xl 25 mg daily  Lasix  20 mg daily prn swelling  Klor con

## 2023-12-23 NOTE — Progress Notes (Signed)
 Subjective:    Patient ID: Kristine [name], female    DOB: 1956-04-19, 68 y.o.   MRN: [id number]  HPI  Here for annual follow up of chronic health problems   Wt Readings from Last 3 Encounters:  12/23/23 181 lb 6 oz (82.3 kg)  10/22/23 186 lb (84.4 kg)  09/30/23 186 lb 6 oz (84.5 kg)   30.65 kg/m  Vitals:   12/23/23 1000  BP: 118/78  Pulse: 61  Temp: 98 F (36.7 C)  SpO2: 98%    Immunization History  Administered Date(s) Administered   Fluad Quad(high Dose 65+) 03/01/2021   Hepatitis B 05/13/1994   Influenza Split 03/21/2011, 02/03/2013, 02/22/2014   Influenza Whole 02/21/2005, 03/02/2007, 02/09/2009, 03/22/2010   Influenza, High Dose Seasonal PF 03/22/2023   Influenza,inj,Quad PF,6+ Mos 03/29/2015, 03/05/2017, 02/19/2018, 03/02/2019   Influenza-Unspecified 03/28/2015   PFIZER(Purple Top)SARS-COV-2 Vaccination 07/10/2019, 07/30/2019, 02/23/2020   PNEUMOCOCCAL CONJUGATE-20 10/24/2020   Pneumococcal Polysaccharide-23 01/24/2003, 12/07/2007   Pneumococcal-Unspecified 05/13/2013, 10/24/2020   Td 12/07/2007   Tdap 05/13/2013, 04/03/2018   Unspecified SARS-COV-2 Vaccination 07/10/2019, 07/30/2019, 02/22/2020   Zoster Recombinant(Shingrix) 01/29/2023, 07/11/2023   Zoster, Live 03/07/2016    Health Maintenance Due  Topic Date Due   Hepatitis B Vaccines (2 of 3 - 19+ 3-dose series) 06/10/1994   Feeling pretty good Some bouts of fatigue  Pain in knee- getting a gel injection scheduled   Weight is down 5 more lb Happy with that  Would be even better if she had not gone on cruise in July  Tried not to overeat    Had prevnar 20 in 2022  Gets flu shot yearly    Mammogram 10/2023  MRI is December  Gets mammo / MRI every other year due to breast cancer risk  Self breast exam- no lumps   [facility]  No new issues    Colon cancer screening -colonoscopy 11/2022 -adenoma  7 year recall     Bone health  Dexa 07/2021 at gyn ,  in the normal range , thinks she may have had one since/ cannot remember , last one was fine  Falls-none  Fractures-none  Supplements -D3  Last vitamin D Lab Results  Component Value Date   VD25OH 31 05/04/2009    Exercise :  Knee problems slow her down  Tried chair yoga last week  Goes to the gym 3 days per week minimum - exercise bike  Some weights/strength training     Mood    12/23/2023   10:06 AM 10/22/2023    2:29 PM 09/30/2023   10:26 AM 12/11/2022    9:39 AM 10/21/2022    2:55 PM  Depression screen PHQ 2/9  Decreased Interest 0 0 0 0 0  Down, Depressed, Hopeless 0 0 0 0 0  PHQ - 2 Score 0 0 0 0 0  Altered sleeping 0  1 1   Tired, decreased energy 1  1 1    Change in appetite 0  0 0   Feeling bad or failure about yourself  0  0 0   Trouble concentrating 0  0 0   Moving slowly or fidgety/restless 0  0 0   Suicidal thoughts 0  0 0   PHQ-9 Score 1  2 2    Difficult doing work/chores Not difficult at all Not difficult at all Not difficult at all Not difficult at all    Some stress/ family issues - dealing well  Things are getting better  Xanax  0.25 mg prn bedtime  Wellbutrin  xl 300 mg daily     HTN bp is stable today  No cp or palpitations or headaches or edema  No side effects to medicines  BP Readings from Last 3 Encounters:  12/23/23 118/78  09/30/23 134/80  07/20/23 139/84    Metoprolol  xl 25 mg daily  Takes lasix  20 mg prn from cardiology / has Klor con on list as well  Sees cardiology for history of pulm HTN in past   Pulse Readings from Last 3 Encounters:  12/23/23 61  09/30/23 60  07/20/23 65     Lab Results  Component Value Date   NA 138 09/30/2023   K 4.3 09/30/2023   CO2 27 09/30/2023   GLUCOSE 108 (H) 09/30/2023   BUN 17 09/30/2023   CREATININE 0.72 09/30/2023   CALCIUM 9.7 09/30/2023   GFR 85.50 09/30/2023   GFRNONAA 93 01/08/2018   History of factor 5 mutation Takes asa 81 mg daily   GERD Esomeprazole  prn  Lab Results   Component Value Date   VITAMINB12 600 09/30/2023     Prediabetes Lab Results  Component Value Date   HGBA1C 5.8 (A) 12/23/2023   HGBA1C 6.8 (H) 09/30/2023   HGBA1C 6.6 (H) 12/04/2022   Improved  Metformin  xr 500 mg daily  Really watching diet  Limiting sweets- just a bite if she has them     Bowel habits are changed on this - loose stools some days  Is tolerable for now  Has IBS baseline    Hyperlipidemia Lab Results  Component Value Date   CHOL 188 09/30/2023   CHOL 156 12/04/2022   CHOL 167 12/04/2021   Lab Results  Component Value Date   HDL 79.10 09/30/2023   HDL 58.60 12/04/2022   HDL 62.90 12/04/2021   Lab Results  Component Value Date   LDLCALC 93 09/30/2023   LDLCALC 80 12/04/2022   LDLCALC 87 12/04/2021   Lab Results  Component Value Date   TRIG 80.0 09/30/2023   TRIG 87.0 12/04/2022   TRIG 88.0 12/04/2021   Lab Results  Component Value Date   CHOLHDL 2 09/30/2023   CHOLHDL 3 12/04/2022   CHOLHDL 3 12/04/2021   Lab Results  Component Value Date   LDLDIRECT 146.1 08/19/2006   Simvastatin  10 mg daily   Lab Results  Component Value Date   ALT 15 09/30/2023   AST 14 09/30/2023   ALKPHOS 68 09/30/2023   BILITOT 0.6 09/30/2023   Lab Results  Component Value Date   WBC 6.5 09/30/2023   HGB 14.2 09/30/2023   HCT 43.1 09/30/2023   MCV 85.1 09/30/2023   PLT 209.0 09/30/2023   Lab Results  Component Value Date   TSH 1.95 09/30/2023   Needs refill of flexeril  for back pain (aggravated by knee)         Patient Active Problem List   Diagnosis Date Noted   Adverse effect of metformin  09/30/2023   Hx of adenomatous polyp of colon 12/06/2022   Arm vein blood clot, right 12/26/2020   Estrogen deficiency 10/24/2020   History of pulmonary hypertension 06/08/2019   History of shingles 01/21/2018   Prediabetes 04/21/2015   Stress reaction 04/21/2015   Essential hypertension 04/21/2015   Migraine with aura 06/21/2013   Obesity  06/16/2011   Rhinitis 06/14/2011   Routine general medical examination at a health care facility 06/14/2011   SCIATICA 05/22/2009   GASTROESOPHAGEAL REFLUX DISEASE 04/14/2009   Factor 5  Leiden mutation, heterozygous (HCC) 12/07/2007   Depression with anxiety 11/25/2006   IBS 11/25/2006   DVT, HX OF 11/25/2006   MIGRAINES, HX OF 11/25/2006   HYPERCHOLESTEROLEMIA, PURE 10/29/2006   Past Medical History:  Diagnosis Date   Allergy    Percocet, Percodan, Paxil, Zoloft, EES, Augmentin,   Anxiety    Arthritis 2025   Toe/knee   Basal cell cancer    Basal cell carcinoma    face   Cataract 2019   Clotting disorder (HCC) 2009   Factor 5   Depression 1989   DVT (deep venous thrombosis) (HCC) 05/13/2006   Esophageal erosions    Factor V Leiden (HCC)    On coag x 1 year ending in 09   Gastritis 05/13/1988   hospital- abd pain   GERD (gastroesophageal reflux disease)    Hx of adenomatous polyp of colon 12/06/2022   5 mm rectal adenoma repeat colonoscopy 2031    Hyperlipidemia    Hypertension    Radial head fracture 05/13/2009   Past Surgical History:  Procedure Laterality Date   BIOPSY BREAST Right 10/2022   BREAST SURGERY  2006   Ductal Papilloma/2023 breast biopsy-benign/2024 breast biopsy -benign   CESAREAN SECTION  1987   CHOLECYSTECTOMY  01/27/2003   COLONOSCOPY     DILATION AND CURETTAGE OF UTERUS  08/22/2022   MOHS SURGERY  11/10/2009   basal cell carcinoma near eye   OOPHORECTOMY     SHOULDER SURGERY  04/12/2006   TONSILLECTOMY  05/13/1974   UPPER GASTROINTESTINAL ENDOSCOPY     Social History   Tobacco Use   Smoking status: Never   Smokeless tobacco: Never  Vaping Use   Vaping status: Never Used  Substance Use Topics   Alcohol use: Yes    Alcohol/week: 2.0 standard drinks of alcohol    Types: 1 Glasses of wine, 1 Standard drinks or equivalent per week    Comment: occ   Drug use: No   Family History  Problem Relation Age of Onset   Graves' disease  Mother    Depression Mother    Osteoporosis Mother    Hearing loss Mother    Hypertension Mother    Stroke Mother    Vision loss Mother    Varicose Veins Mother    Heart attack Father        x 3    Diabetes Father    Heart disease Father    Hypertension Father    Vision loss Father    Breast cancer Sister    Hypertension Sister    Colon polyps Brother    Heart attack Brother        x 2   Diabetes Brother    Obesity Brother    Cancer Brother    Heart disease Brother    Hypertension Brother    Hypertension Brother    Coronary artery disease Brother        with stents    Atrial fibrillation Brother    Hypertension Maternal Grandmother    Stroke Maternal Grandmother    Hypertension Maternal Grandfather    Stroke Maternal Grandfather    Heart disease Paternal Grandmother    Hypertension Paternal Grandmother    Hypertension Paternal Grandfather    Kidney disease Paternal Grandfather    Obesity Paternal Grandfather    Asthma Daughter    Heart disease Paternal Uncle    Heart disease Brother    Hypertension Brother    Heart disease Maternal Uncle  Hypertension Maternal Uncle    Diabetes Paternal Uncle    Heart disease Paternal Uncle    Hypertension Paternal Uncle    Diabetes Paternal Uncle    Heart disease Paternal Uncle    Hypertension Paternal Uncle    Heart disease Paternal Uncle    Hypertension Paternal Uncle    Diabetes Paternal Uncle    Heart disease Paternal Uncle    Hypertension Paternal Uncle    Cancer Sister    Hypertension Sister    Varicose Veins Sister    Anxiety disorder Maternal Aunt    Hypertension Maternal Aunt    Stroke Maternal Aunt    Hypertension Maternal Aunt    Stroke Maternal Aunt    Hypertension Paternal Uncle    COPD Maternal Uncle    Heart disease Maternal Uncle    Colon cancer Neg Hx    Esophageal cancer Neg Hx    Rectal cancer Neg Hx    Stomach cancer Neg Hx    Allergies  Allergen Reactions   Oxycodone-Acetaminophen Rash  and Other (See Comments)   Paroxetine Hives   Paroxetine Hcl Hives   Sulfa Antibiotics Other (See Comments)   Amoxicillin-Pot Clavulanate     REACTION: GI   Amoxicillin-Pot Clavulanate Other (See Comments)    REACTION: GI   Calcium     REACTION: muscle aches   Erythromycin Other (See Comments)    REACTION: GI   Guaifenesin     REACTION: GI   Naproxen     REACTION: GI   Oxycodone-Acetaminophen     REACTION: reaction not known   Oxycodone-Acetaminophen Other (See Comments)    Other Reaction: Not Assessed   Oxycodone-Aspirin     REACTION: reaction not known   Oxycodone-Aspirin     REACTION: reaction not known    Sertraline Hcl     REACTION: hives   Sertraline Hcl    Sertraline Rash and Other (See Comments)    REACTION: hives   Current Outpatient Medications on File Prior to Visit  Medication Sig Dispense Refill   acetaminophen (TYLENOL) 500 MG tablet Take 500 mg by mouth every 6 (six) hours as needed.     acyclovir  ointment (ZOVIRAX ) 5 % Apply 1 Application topically every 3 (three) hours. To cold sore as needed 5 g 3   albuterol  (VENTOLIN  HFA) 108 (90 Base) MCG/ACT inhaler Inhale 1-2 puffs into the lungs every 6 (six) hours as needed. 18 g 0   ALPRAZolam  (XANAX ) 0.25 MG tablet Take 1 tablet (0.25 mg total) by mouth at bedtime as needed for anxiety. 30 tablet 0   aspirin EC 81 MG tablet Take 81 mg by mouth daily. Swallow whole.     Cetirizine HCl (ZYRTEC PO) Take 1 tablet by mouth daily.     Cholecalciferol (VITAMIN D3 PO) Take 5,000 Units by mouth daily.     ciclopirox (PENLAC) 8 % solution Apply topically daily.     ESOMEPRAZOLE  MAGNESIUM  PO Take 1 tablet by mouth daily as needed.     fluticasone  (FLONASE ) 50 MCG/ACT nasal spray PLACE 2 SPRAYS INTO THE NOSE DAILY AS NEEDED 48 g 3   furosemide  (LASIX ) 20 MG tablet Twice weekly and as needed 90 tablet 3   loratadine (CLARITIN) 10 MG tablet Take 10 mg by mouth as needed for allergies. Reported on 05/19/2015     metFORMIN   (GLUCOPHAGE -XR) 500 MG 24 hr tablet TAKE 1 TABLET BY MOUTH EVERY DAY WITH BREAKFAST 90 tablet 0   metoprolol  succinate (TOPROL -XL) 25 MG 24  hr tablet Take 1 tablet (25 mg total) by mouth daily. Take with or immediately following a meal. 90 tablet 3   potassium chloride  (KLOR-CON ) 10 MEQ tablet Take 1 tablet (10 mEq total) by mouth daily. AND AS NEEDED WITH YOUR LASIX  90 tablet 3   Probiotic Product (PROBIOTIC PO) Take 1 tablet by mouth daily.     simvastatin  (ZOCOR ) 10 MG tablet TAKE 1 TABLET BY MOUTH EVERY DAY 90 tablet 2   No current facility-administered medications on file prior to visit.    Review of Systems  Constitutional:  Negative for activity change, appetite change, fatigue, fever and unexpected weight change.  HENT:  Negative for congestion, ear pain, rhinorrhea, sinus pressure and sore throat.   Eyes:  Negative for pain, redness and visual disturbance.  Respiratory:  Negative for cough, shortness of breath and wheezing.   Cardiovascular:  Negative for chest pain and palpitations.  Gastrointestinal:  Negative for abdominal pain, blood in stool, constipation and diarrhea.  Endocrine: Negative for polydipsia and polyuria.  Genitourinary:  Negative for dysuria, frequency and urgency.  Musculoskeletal:  Positive for arthralgias and back pain. Negative for myalgias.  Skin:  Negative for pallor and rash.  Allergic/Immunologic: Negative for environmental allergies.  Neurological:  Negative for dizziness, syncope and headaches.  Hematological:  Negative for adenopathy. Does not bruise/bleed easily.  Psychiatric/Behavioral:  Negative for decreased concentration and dysphoric mood. The patient is not nervous/anxious.        Some stressors        Objective:   Physical Exam Constitutional:      General: She is not in acute distress.    Appearance: Normal appearance. She is well-developed. She is obese. She is not ill-appearing or diaphoretic.  HENT:     Head: Normocephalic and  atraumatic.     Right Ear: Tympanic membrane, ear canal and external ear normal.     Left Ear: Tympanic membrane, ear canal and external ear normal.     Nose: Nose normal. No congestion.     Mouth/Throat:     Mouth: Mucous membranes are moist.     Pharynx: Oropharynx is clear. No posterior oropharyngeal erythema.  Eyes:     General: No scleral icterus.    Extraocular Movements: Extraocular movements intact.     Conjunctiva/sclera: Conjunctivae normal.     Pupils: Pupils are equal, round, and reactive to light.  Neck:     Thyroid : No thyromegaly.     Vascular: No carotid bruit or JVD.  Cardiovascular:     Rate and Rhythm: Normal rate and regular rhythm.     Pulses: Normal pulses.     Heart sounds: Normal heart sounds.     No gallop.  Pulmonary:     Effort: Pulmonary effort is normal. No respiratory distress.     Breath sounds: Normal breath sounds. No wheezing.     Comments: Good air exch Chest:     Chest wall: No tenderness.  Abdominal:     General: Bowel sounds are normal. There is no distension or abdominal bruit.     Palpations: Abdomen is soft. There is no mass.     Tenderness: There is no abdominal tenderness.     Hernia: No hernia is present.  Genitourinary:    Comments: Breast exam: No mass, nodules, thickening, tenderness, bulging, retraction, inflamation, nipple discharge or skin changes noted.  No axillary or clavicular LA.     Musculoskeletal:        General: No tenderness. Normal range  of motion.     Cervical back: Normal range of motion and neck supple. No rigidity. No muscular tenderness.     Right lower leg: No edema.     Left lower leg: No edema.     Comments: No kyphosis   Lymphadenopathy:     Cervical: No cervical adenopathy.  Skin:    General: Skin is warm and dry.     Coloration: Skin is not pale.     Findings: No erythema or rash.     Comments: Solar lentigines diffusely Scattered sks   Neurological:     Mental Status: She is alert. Mental status  is at baseline.     Cranial Nerves: No cranial nerve deficit.     Motor: No abnormal muscle tone.     Coordination: Coordination normal.     Gait: Gait normal.     Deep Tendon Reflexes: Reflexes are normal and symmetric. Reflexes normal.  Psychiatric:        Mood and Affect: Mood normal.        Cognition and Memory: Cognition and memory normal.           Assessment & Plan:   Problem List Items Addressed This Visit       Cardiovascular and Mediastinum   Essential hypertension   bp in fair control at this time  BP Readings from Last 1 Encounters:  12/23/23 118/78   No changes needed Most recent labs reviewed  Disc lifstyle change with low sodium diet and exercise  Continues metoprolol  xl 25 mg daily  Lasix  20 mg daily prn swelling  Klor con          Digestive   IBS   Diarrhea may be worsened by metformin   Encouraged trial of fiber supplement  If intolerable may need to stop metformin         Relevant Medications   meclizine  (ANTIVERT ) 25 MG tablet   GASTROESOPHAGEAL REFLUX DISEASE   Takes ppi prn      Relevant Medications   meclizine  (ANTIVERT ) 25 MG tablet     Hematopoietic and Hemostatic   Factor 5 Leiden mutation, heterozygous (HCC)   Continues 81 mg daily asa         Other   Prediabetes - Primary   Lab Results  Component Value Date   HGBA1C 5.8 (A) 12/23/2023   HGBA1C 6.8 (H) 09/30/2023   HGBA1C 6.6 (H) 12/04/2022   Encouraged to continue metformin  xr 500 mg if tolerated  Also low glycemic diet and exercise  Commended       Relevant Orders   POCT HgB A1C (Completed)   Obesity   Discussed how this problem influences overall health and the risks it imposes  Reviewed plan for weight loss with lower calorie diet (via better food choices (lower glycemic and portion control) along with exercise building up to or more than 30 minutes 5 days per week including some aerobic activity and strength training   Commended on weight loss so far Also  taking metformin        HYPERCHOLESTEROLEMIA, PURE   Disc goals for lipids and reasons to control them Rev last labs with pt Rev low sat fat diet in detail Continues simvastatin  10 mg daily  LDL of 93 HDL up to almost 80- good       Depression with anxiety   Fairly stable Some stressors   Continues Wellbutrin  xl 300 mg daily  Xanax  prn   Encouraged good self care  Relevant Medications   buPROPion  (WELLBUTRIN  XL) 300 MG 24 hr tablet

## 2023-12-23 NOTE — Assessment & Plan Note (Signed)
 Fairly stable Some stressors   Continues Wellbutrin  xl 300 mg daily  Xanax  prn   Encouraged good self care

## 2023-12-23 NOTE — Patient Instructions (Addendum)
 Try a sugar free fiber supplement to help with loose stool- like benefiber or metamucil   Keep us  posted if this worsens   Stay active Add some strength training to your routine, this is important for bone and brain health and can reduce your risk of falls and help your body use insulin properly and regulate weight  Light weights, exercise bands , and internet videos are a good way to start  Yoga (chair or regular), machines , floor exercises or a gym with machines are also good options  Water exercise is great also   Keep working on diet /exercise and weight loss

## 2023-12-23 NOTE — Assessment & Plan Note (Signed)
 Takes ppi prn

## 2024-01-23 ENCOUNTER — Other Ambulatory Visit: Payer: Self-pay | Admitting: Family Medicine

## 2024-01-23 ENCOUNTER — Telehealth: Payer: Self-pay | Admitting: *Deleted

## 2024-01-23 ENCOUNTER — Encounter: Payer: Self-pay | Admitting: Family Medicine

## 2024-01-23 MED ORDER — METFORMIN HCL ER 500 MG PO TB24: 1000.0000 mg | ORAL_TABLET | Freq: Every day | ORAL | 1 refills | Status: AC

## 2024-01-23 NOTE — Telephone Encounter (Signed)
 Yes   2 daily (either at same time or am and pm)   The pharmacy was going off old prescription they had   Thanks

## 2024-01-23 NOTE — Telephone Encounter (Signed)
Sent mychart letting pt know Dr. Tower's comments. 

## 2024-01-23 NOTE — Telephone Encounter (Signed)
 Pt sent a message saying:  Please ask Dr Randeen about my Metformin  dosage. On May 20th, Dr [facility] sent me a note about my HgbA1C being 6.8 and asked that I increase the Metformin  500mg  to two tablets every morning. I may have failed to note on my med list review that my dosage was not updated with this change but I am fairly certain I discussed it on my wellness call with the nurse. I picked up my recent refill at [facility] and the dosage on the bottle was 500mg  once every morning. I still had my previous prescription bottle and the instructions were to take two 500 mg tablets every morning. Can you please confirm the dosage with Dr Randeen and update my prescription at [facility]? I really appreciate your time and have a great day.

## 2024-02-05 ENCOUNTER — Ambulatory Visit
Admission: RE | Admit: 2024-02-05 | Discharge: 2024-02-05 | Disposition: A | Discharge status: Home / Self Care | Source: Ambulatory Visit | Attending: Emergency Medicine | Admitting: Emergency Medicine

## 2024-02-05 VITALS — BP 147/86 | HR 63 | Temp 98.7°F | Resp 17

## 2024-02-05 DIAGNOSIS — R1084 Generalized abdominal pain: Secondary | ICD-10-CM | POA: Diagnosis not present

## 2024-02-05 DIAGNOSIS — J0100 Acute maxillary sinusitis, unspecified: Secondary | ICD-10-CM

## 2024-02-05 MED ORDER — AZITHROMYCIN 250 MG PO TABS: 250.0000 mg | ORAL_TABLET | Freq: Every day | ORAL | 0 refills | Status: DC

## 2024-02-05 NOTE — ED Triage Notes (Addendum)
 Patient to Urgent Care with complaints of sinus pressure/ headaches/ upper airway congestion/ body aches/ thick, bloody and tan colored sinus drainage/ productive cough with discolored sputum.  Symptoms x10 days. Cough started Monday night. Taking claritin/ nasal saline spray.  Also reports intermittent abdominal pain and excessive gas.

## 2024-02-05 NOTE — Discharge Instructions (Addendum)
 Follow up with your primary care provider tomorrow.  Go to the emergency department if you have worsening symptoms.    Take the Zithromax  as directed.

## 2024-02-05 NOTE — ED Provider Notes (Signed)
 [facility]    CSN: [id number] Arrival date & time: 02/05/24  1648      History   Chief Complaint Chief Complaint  Patient presents with   Cough    Sinus pressure/discomfortH/A congestion upper airwaysAchesAbdominal and stomach pain intermittentlyExcessive gas upper and lower Bloody and deep tan sinus drainage that is thick Productive cough with deep tan,yellowish green , thick sputum - Entered by patient    HPI Kristine [name] is a 68 y.o. female.  Patient presents with 10-day history of sinus pressure, congestion, postnasal drip, runny nose, cough.  Her symptoms started after taking a cruise.  She has been treating her symptoms with nasal saline spray without relief; She took Claritin last week.  She also reports abdominal cramping yesterday which has improved.  Today she just feels sore from all of the cramping.  No fever, chills, shortness of breath, vomiting, diarrhea.  The history is provided by the patient and medical records.    Past Medical History:  Diagnosis Date   Allergy    Percocet, Percodan, Paxil, Zoloft, EES, Augmentin,   Anxiety    Arthritis 2025   Toe/knee   Basal cell cancer    Basal cell carcinoma    face   Cataract 2019   Clotting disorder 2009   Factor 5   Depression 1989   DVT (deep venous thrombosis) (HCC) 05/13/2006   Esophageal erosions    Factor V Leiden    On coag x 1 year ending in 09   Gastritis 05/13/1988   hospital- abd pain   GERD (gastroesophageal reflux disease)    Hx of adenomatous polyp of colon 12/06/2022   5 mm rectal adenoma repeat colonoscopy 2031    Hyperlipidemia    Hypertension    Radial head fracture 05/13/2009    Patient Active Problem List   Diagnosis Date Noted   Back pain 12/23/2023   Adverse effect of metformin  09/30/2023   Hx of adenomatous polyp of colon 12/06/2022   Arm vein blood clot, right 12/26/2020   Estrogen deficiency 10/24/2020   History of pulmonary hypertension 06/08/2019    History of shingles 01/21/2018   Prediabetes 04/21/2015   Stress reaction 04/21/2015   Essential hypertension 04/21/2015   Migraine with aura 06/21/2013   Obesity 06/16/2011   Rhinitis 06/14/2011   Routine general medical examination at a health care facility 06/14/2011   SCIATICA 05/22/2009   GASTROESOPHAGEAL REFLUX DISEASE 04/14/2009   Factor 5 Leiden mutation, heterozygous 12/07/2007   Depression with anxiety 11/25/2006   IBS 11/25/2006   DVT, HX OF 11/25/2006   MIGRAINES, HX OF 11/25/2006   HYPERCHOLESTEROLEMIA, PURE 10/29/2006    Past Surgical History:  Procedure Laterality Date   BIOPSY BREAST Right 10/2022   BREAST SURGERY  2006   Ductal Papilloma/2023 breast biopsy-benign/2024 breast biopsy -benign   CESAREAN SECTION  1987   CHOLECYSTECTOMY  01/27/2003   COLONOSCOPY     DILATION AND CURETTAGE OF UTERUS  08/22/2022   MOHS SURGERY  11/10/2009   basal cell carcinoma near eye   OOPHORECTOMY     SHOULDER SURGERY  04/12/2006   TONSILLECTOMY  05/13/1974   UPPER GASTROINTESTINAL ENDOSCOPY      OB History   No obstetric history on file.      Home Medications    Prior to Admission medications   Medication Sig Start Date End Date Taking? Authorizing Provider  azithromycin  (ZITHROMAX ) 250 MG tablet Take 1 tablet (250 mg total) by mouth daily. Take first 2  tablets together, then 1 every day until finished. 02/05/24  Yes Corlis Burnard DEL, NP  acetaminophen (TYLENOL) 500 MG tablet Take 500 mg by mouth every 6 (six) hours as needed.    [provider]  acyclovir  ointment (ZOVIRAX ) 5 % Apply 1 Application topically every 3 (three) hours. To cold sore as needed 08/28/22   Tower, Laine LABOR, MD  albuterol  (VENTOLIN  HFA) 108 (90 Base) MCG/ACT inhaler Inhale 1-2 puffs into the lungs every 6 (six) hours as needed. 04/25/23   Corlis Burnard DEL, NP  ALPRAZolam  (XANAX ) 0.25 MG tablet Take 1 tablet (0.25 mg total) by mouth at bedtime as needed for anxiety. 12/11/22   Tower, Laine LABOR, MD   aspirin EC 81 MG tablet Take 81 mg by mouth daily. Swallow whole.    [provider]  buPROPion  (WELLBUTRIN  XL) 300 MG 24 hr tablet Take 1 tablet (300 mg total) by mouth daily. TAKE 1 TABLET BY MOUTH EVERY DAY 12/23/23   Tower, Laine LABOR, MD  Cetirizine HCl (ZYRTEC PO) Take 1 tablet by mouth daily. 03/13/20   [provider]  Cholecalciferol (VITAMIN D3 PO) Take 5,000 Units by mouth daily.    [provider]  ciclopirox (PENLAC) 8 % solution Apply topically daily. 05/08/21   [provider]  cyclobenzaprine  (FLEXERIL ) 10 MG tablet Take 0.5 tablets (5 mg total) by mouth 3 (three) times daily as needed for muscle spasms. 12/23/23   Tower, Laine LABOR, MD  ESOMEPRAZOLE  MAGNESIUM  PO Take 1 tablet by mouth daily as needed. 02/11/20   [provider]  fluticasone  (FLONASE ) 50 MCG/ACT nasal spray PLACE 2 SPRAYS INTO THE NOSE DAILY AS NEEDED 04/03/18   Tower, Laine LABOR, MD  furosemide  (LASIX ) 20 MG tablet Twice weekly and as needed 09/29/23   Okey Vina GAILS, MD  loratadine (CLARITIN) 10 MG tablet Take 10 mg by mouth as needed for allergies. Reported on 05/19/2015    [provider]  meclizine  (ANTIVERT ) 25 MG tablet Take 1 tablet (25 mg total) by mouth 2 (two) times daily as needed for dizziness. 12/23/23   Tower, Laine LABOR, MD  metFORMIN  (GLUCOPHAGE -XR) 500 MG 24 hr tablet Take 2 tablets (1,000 mg total) by mouth daily. 01/23/24   Tower, Laine LABOR, MD  metoprolol  succinate (TOPROL -XL) 25 MG 24 hr tablet Take 1 tablet (25 mg total) by mouth daily. Take with or immediately following a meal. 09/29/23 12/28/23  Okey Vina GAILS, MD  potassium chloride  (KLOR-CON ) 10 MEQ tablet Take 1 tablet (10 mEq total) by mouth daily. AND AS NEEDED WITH YOUR LASIX  08/20/22   Parthenia Olivia HERO, PA-C  Probiotic Product (PROBIOTIC PO) Take 1 tablet by mouth daily.    [provider]  simvastatin  (ZOCOR ) 10 MG tablet TAKE 1 TABLET BY MOUTH EVERY DAY 09/26/23   Tower, Laine LABOR, MD    Family  History Family History  Problem Relation Age of Onset   Graves' disease Mother    Depression Mother    Osteoporosis Mother    Hearing loss Mother    Hypertension Mother    Stroke Mother    Vision loss Mother    Varicose Veins Mother    Heart attack Father        x 3    Diabetes Father    Heart disease Father    Hypertension Father    Vision loss Father    Breast cancer Sister    Hypertension Sister    Colon polyps Brother    Heart  attack Brother        x 2   Diabetes Brother    Obesity Brother    Cancer Brother    Heart disease Brother    Hypertension Brother    Hypertension Brother    Coronary artery disease Brother        with stents    Atrial fibrillation Brother    Hypertension Maternal Grandmother    Stroke Maternal Grandmother    Hypertension Maternal Grandfather    Stroke Maternal Grandfather    Heart disease Paternal Grandmother    Hypertension Paternal Grandmother    Hypertension Paternal Grandfather    Kidney disease Paternal Grandfather    Obesity Paternal Grandfather    Asthma Daughter    Heart disease Paternal Uncle    Heart disease Brother    Hypertension Brother    Heart disease Maternal Uncle    Hypertension Maternal Uncle    Diabetes Paternal Uncle    Heart disease Paternal Uncle    Hypertension Paternal Uncle    Diabetes Paternal Uncle    Heart disease Paternal Uncle    Hypertension Paternal Uncle    Heart disease Paternal Uncle    Hypertension Paternal Uncle    Diabetes Paternal Uncle    Heart disease Paternal Uncle    Hypertension Paternal Uncle    Cancer Sister    Hypertension Sister    Varicose Veins Sister    Anxiety disorder Maternal Aunt    Hypertension Maternal Aunt    Stroke Maternal Aunt    Hypertension Maternal Aunt    Stroke Maternal Aunt    Hypertension Paternal Uncle    COPD Maternal Uncle    Heart disease Maternal Uncle    Colon cancer Neg Hx    Esophageal cancer Neg Hx    Rectal cancer Neg Hx    Stomach cancer  Neg Hx     Social History Social History   Tobacco Use   Smoking status: Never   Smokeless tobacco: Never  Vaping Use   Vaping status: Never Used  Substance Use Topics   Alcohol use: Yes    Alcohol/week: 2.0 standard drinks of alcohol    Types: 1 Glasses of wine, 1 Standard drinks or equivalent per week    Comment: occ   Drug use: No     Allergies   Oxycodone-acetaminophen, Paroxetine, Paroxetine hcl, Sulfa antibiotics, Amoxicillin-pot clavulanate, Amoxicillin-pot clavulanate, Calcium, Erythromycin, Guaifenesin, Naproxen, Oxycodone-acetaminophen, Oxycodone-acetaminophen, Oxycodone-aspirin, Oxycodone-aspirin, Sertraline hcl, Sertraline hcl, and Sertraline   Review of Systems Review of Systems  Constitutional:  Negative for chills and fever.  HENT:  Positive for congestion, postnasal drip, rhinorrhea and sinus pressure. Negative for ear pain and sore throat.   Respiratory:  Positive for cough. Negative for shortness of breath.   Gastrointestinal:  Positive for abdominal pain. Negative for blood in stool, constipation, diarrhea, nausea and vomiting.     Physical Exam Triage Vital Signs ED Triage Vitals [02/05/24 1703]  Encounter Vitals Group     BP      Girls Systolic BP Percentile      Girls Diastolic BP Percentile      Boys Systolic BP Percentile      Boys Diastolic BP Percentile      Pulse      Resp      Temp      Temp src      SpO2      Weight      Height      Head Circumference  Peak Flow      Pain Score 2     Pain Loc      Pain Education      Exclude from Growth Chart    No data found.  Updated Vital Signs BP (!) 147/86   Pulse 63   Temp 98.7 F (37.1 C)   Resp 17   SpO2 98%   Visual Acuity Right Eye Distance:   Left Eye Distance:   Bilateral Distance:    Right Eye Near:   Left Eye Near:    Bilateral Near:     Physical Exam Constitutional:      General: She is not in acute distress. HENT:     Right Ear: Tympanic membrane normal.      Left Ear: Tympanic membrane normal.     Nose: Congestion and rhinorrhea present.     Mouth/Throat:     Mouth: Mucous membranes are moist.     Pharynx: Oropharynx is clear.  Cardiovascular:     Rate and Rhythm: Normal rate and regular rhythm.     Heart sounds: Normal heart sounds.  Pulmonary:     Effort: Pulmonary effort is normal. No respiratory distress.     Breath sounds: Normal breath sounds.  Abdominal:     General: Bowel sounds are normal.     Palpations: Abdomen is soft.     Tenderness: There is no abdominal tenderness. There is no right CVA tenderness, left CVA tenderness, guarding or rebound.  Neurological:     Mental Status: She is alert.      UC Treatments / Results  Labs (all labs ordered are listed, but only abnormal results are displayed) Labs Reviewed - No data to display  EKG   Radiology No results found.  Procedures Procedures (including critical care time)  Medications Ordered in UC Medications - No data to display  Initial Impression / Assessment and Plan / UC Course  I have reviewed the triage vital signs and the nursing notes.  Pertinent labs & imaging results that were available during my care of the patient were reviewed by me and considered in my medical decision making (see chart for details).    Acute sinusitis, abdominal pain.  Afebrile and vital signs are stable.  Abdomen is soft and nontender with good bowel sounds.  Patient has had sinus congestion for 10 days.  Treating today with Zithromax  as patient reports this has worked well for her in the past.  Instructed her to follow-up with her PCP tomorrow.  ED precautions given.  Education provided on sinus infection and abdominal pain.  She agrees to plan of care.  Final Clinical Impressions(s) / UC Diagnoses   Final diagnoses:  Acute non-recurrent maxillary sinusitis  Generalized abdominal pain     Discharge Instructions      Follow up with your primary care provider tomorrow.  Go  to the emergency department if you have worsening symptoms.    Take the Zithromax  as directed.         ED Prescriptions     Medication Sig Dispense Auth. Provider   azithromycin  (ZITHROMAX ) 250 MG tablet Take 1 tablet (250 mg total) by mouth daily. Take first 2 tablets together, then 1 every day until finished. 6 tablet Corlis Burnard DEL, NP      PDMP not reviewed this encounter.   Corlis Burnard DEL, NP 02/05/24 1750

## 2024-02-06 ENCOUNTER — Ambulatory Visit: Admitting: Family Medicine

## 2024-02-19 ENCOUNTER — Other Ambulatory Visit: Payer: Self-pay

## 2024-02-19 ENCOUNTER — Encounter (HOSPITAL_COMMUNITY): Payer: Self-pay | Admitting: Emergency Medicine

## 2024-02-19 ENCOUNTER — Inpatient Hospital Stay (HOSPITAL_COMMUNITY)
Admission: EM | Admit: 2024-02-19 | Discharge: 2024-03-07 | DRG: 330 | Disposition: A | Discharge status: Rehabilitation Facility | Attending: Internal Medicine | Admitting: Internal Medicine

## 2024-02-19 DIAGNOSIS — Z85828 Personal history of other malignant neoplasm of skin: Secondary | ICD-10-CM

## 2024-02-19 DIAGNOSIS — Z885 Allergy status to narcotic agent status: Secondary | ICD-10-CM

## 2024-02-19 DIAGNOSIS — Z7984 Long term (current) use of oral hypoglycemic drugs: Secondary | ICD-10-CM

## 2024-02-19 DIAGNOSIS — Z9049 Acquired absence of other specified parts of digestive tract: Secondary | ICD-10-CM

## 2024-02-19 DIAGNOSIS — Z86718 Personal history of other venous thrombosis and embolism: Secondary | ICD-10-CM

## 2024-02-19 DIAGNOSIS — K219 Gastro-esophageal reflux disease without esophagitis: Secondary | ICD-10-CM | POA: Diagnosis present

## 2024-02-19 DIAGNOSIS — Z822 Family history of deafness and hearing loss: Secondary | ICD-10-CM

## 2024-02-19 DIAGNOSIS — Z8249 Family history of ischemic heart disease and other diseases of the circulatory system: Secondary | ICD-10-CM

## 2024-02-19 DIAGNOSIS — Z8262 Family history of osteoporosis: Secondary | ICD-10-CM

## 2024-02-19 DIAGNOSIS — Z821 Family history of blindness and visual loss: Secondary | ICD-10-CM

## 2024-02-19 DIAGNOSIS — E870 Hyperosmolality and hypernatremia: Secondary | ICD-10-CM | POA: Diagnosis not present

## 2024-02-19 DIAGNOSIS — Z818 Family history of other mental and behavioral disorders: Secondary | ICD-10-CM

## 2024-02-19 DIAGNOSIS — Z79899 Other long term (current) drug therapy: Secondary | ICD-10-CM

## 2024-02-19 DIAGNOSIS — E785 Hyperlipidemia, unspecified: Secondary | ICD-10-CM | POA: Diagnosis present

## 2024-02-19 DIAGNOSIS — Y838 Other surgical procedures as the cause of abnormal reaction of the patient, or of later complication, without mention of misadventure at the time of the procedure: Secondary | ICD-10-CM | POA: Diagnosis not present

## 2024-02-19 DIAGNOSIS — E869 Volume depletion, unspecified: Secondary | ICD-10-CM | POA: Diagnosis present

## 2024-02-19 DIAGNOSIS — E876 Hypokalemia: Secondary | ICD-10-CM | POA: Diagnosis not present

## 2024-02-19 DIAGNOSIS — K56609 Unspecified intestinal obstruction, unspecified as to partial versus complete obstruction: Secondary | ICD-10-CM | POA: Diagnosis not present

## 2024-02-19 DIAGNOSIS — D6851 Activated protein C resistance: Secondary | ICD-10-CM | POA: Diagnosis present

## 2024-02-19 DIAGNOSIS — Z823 Family history of stroke: Secondary | ICD-10-CM

## 2024-02-19 DIAGNOSIS — Z90721 Acquired absence of ovaries, unilateral: Secondary | ICD-10-CM

## 2024-02-19 DIAGNOSIS — I10 Essential (primary) hypertension: Secondary | ICD-10-CM | POA: Diagnosis present

## 2024-02-19 DIAGNOSIS — K9189 Other postprocedural complications and disorders of digestive system: Secondary | ICD-10-CM | POA: Diagnosis not present

## 2024-02-19 DIAGNOSIS — K91872 Postprocedural seroma of a digestive system organ or structure following a digestive system procedure: Secondary | ICD-10-CM | POA: Diagnosis not present

## 2024-02-19 DIAGNOSIS — K9181 Other intraoperative complications of digestive system: Secondary | ICD-10-CM | POA: Diagnosis present

## 2024-02-19 DIAGNOSIS — Z7982 Long term (current) use of aspirin: Secondary | ICD-10-CM

## 2024-02-19 DIAGNOSIS — K5652 Intestinal adhesions [bands] with complete obstruction: Secondary | ICD-10-CM | POA: Diagnosis not present

## 2024-02-19 DIAGNOSIS — E8770 Fluid overload, unspecified: Secondary | ICD-10-CM | POA: Diagnosis not present

## 2024-02-19 DIAGNOSIS — E11649 Type 2 diabetes mellitus with hypoglycemia without coma: Secondary | ICD-10-CM | POA: Diagnosis not present

## 2024-02-19 DIAGNOSIS — Z888 Allergy status to other drugs, medicaments and biological substances status: Secondary | ICD-10-CM

## 2024-02-19 DIAGNOSIS — Z6829 Body mass index (BMI) 29.0-29.9, adult: Secondary | ICD-10-CM

## 2024-02-19 DIAGNOSIS — Z833 Family history of diabetes mellitus: Secondary | ICD-10-CM

## 2024-02-19 DIAGNOSIS — Z882 Allergy status to sulfonamides status: Secondary | ICD-10-CM

## 2024-02-19 DIAGNOSIS — Z825 Family history of asthma and other chronic lower respiratory diseases: Secondary | ICD-10-CM

## 2024-02-19 DIAGNOSIS — Z881 Allergy status to other antibiotic agents status: Secondary | ICD-10-CM

## 2024-02-19 DIAGNOSIS — F32A Depression, unspecified: Secondary | ICD-10-CM | POA: Diagnosis present

## 2024-02-19 DIAGNOSIS — E119 Type 2 diabetes mellitus without complications: Secondary | ICD-10-CM | POA: Diagnosis present

## 2024-02-19 DIAGNOSIS — E440 Moderate protein-calorie malnutrition: Secondary | ICD-10-CM | POA: Diagnosis present

## 2024-02-19 DIAGNOSIS — N179 Acute kidney failure, unspecified: Secondary | ICD-10-CM | POA: Diagnosis not present

## 2024-02-19 DIAGNOSIS — Z5331 Laparoscopic surgical procedure converted to open procedure: Secondary | ICD-10-CM

## 2024-02-19 DIAGNOSIS — K567 Ileus, unspecified: Secondary | ICD-10-CM | POA: Diagnosis not present

## 2024-02-19 DIAGNOSIS — T8141XA Infection following a procedure, superficial incisional surgical site, initial encounter: Secondary | ICD-10-CM | POA: Diagnosis not present

## 2024-02-19 DIAGNOSIS — Z860101 Personal history of adenomatous and serrated colon polyps: Secondary | ICD-10-CM

## 2024-02-19 DIAGNOSIS — F419 Anxiety disorder, unspecified: Secondary | ICD-10-CM | POA: Diagnosis present

## 2024-02-19 DIAGNOSIS — Z803 Family history of malignant neoplasm of breast: Secondary | ICD-10-CM

## 2024-02-19 DIAGNOSIS — K6811 Postprocedural retroperitoneal abscess: Secondary | ICD-10-CM | POA: Diagnosis not present

## 2024-02-19 LAB — CBC
HCT: 42.8 % (ref 36.0–46.0)
Hemoglobin: 13.6 g/dL (ref 12.0–15.0)
MCH: 27.8 pg (ref 26.0–34.0)
MCHC: 31.8 g/dL (ref 30.0–36.0)
MCV: 87.5 fL (ref 80.0–100.0)
Platelets: 199 K/uL (ref 150–400)
RBC: 4.89 MIL/uL (ref 3.87–5.11)
RDW: 12.7 % (ref 11.5–15.5)
WBC: 6.4 K/uL (ref 4.0–10.5)
nRBC: 0 % (ref 0.0–0.2)

## 2024-02-19 LAB — URINALYSIS, ROUTINE W REFLEX MICROSCOPIC
Bilirubin Urine: NEGATIVE
Glucose, UA: NEGATIVE mg/dL
Hgb urine dipstick: NEGATIVE
Ketones, ur: NEGATIVE mg/dL
Leukocytes,Ua: NEGATIVE
Nitrite: NEGATIVE
Protein, ur: NEGATIVE mg/dL
Specific Gravity, Urine: 1.018 (ref 1.005–1.030)
pH: 8 (ref 5.0–8.0)

## 2024-02-19 LAB — COMPREHENSIVE METABOLIC PANEL WITH GFR
ALT: 14 U/L (ref 0–44)
AST: 18 U/L (ref 15–41)
Albumin: 3.9 g/dL (ref 3.5–5.0)
Alkaline Phosphatase: 62 U/L (ref 38–126)
Anion gap: 13 (ref 5–15)
BUN: 15 mg/dL (ref 8–23)
CO2: 27 mmol/L (ref 22–32)
Calcium: 9.4 mg/dL (ref 8.9–10.3)
Chloride: 102 mmol/L (ref 98–111)
Creatinine, Ser: 0.93 mg/dL (ref 0.44–1.00)
GFR, Estimated: >60 mL/min (ref >60)
Glucose, Bld: 157 mg/dL — ABNORMAL HIGH (ref 70–99)
Potassium: 4.3 mmol/L (ref 3.5–5.1)
Sodium: 142 mmol/L (ref 135–145)
Total Bilirubin: 0.5 mg/dL (ref 0.0–1.2)
Total Protein: 6.3 g/dL — ABNORMAL LOW (ref 6.5–8.1)

## 2024-02-19 LAB — LIPASE, BLOOD: Lipase: 33 U/L (ref 11–51)

## 2024-02-19 NOTE — ED Triage Notes (Signed)
 Pt c/o sudden onset abd pain with radiation to both sides of lower abdomen and diarrhea x 2-3 hours ago, denies n/v

## 2024-02-20 ENCOUNTER — Emergency Department (HOSPITAL_COMMUNITY)

## 2024-02-20 ENCOUNTER — Inpatient Hospital Stay (HOSPITAL_COMMUNITY)

## 2024-02-20 ENCOUNTER — Encounter (HOSPITAL_COMMUNITY): Payer: Self-pay | Admitting: Internal Medicine

## 2024-02-20 DIAGNOSIS — R5381 Other malaise: Secondary | ICD-10-CM | POA: Diagnosis not present

## 2024-02-20 DIAGNOSIS — D6851 Activated protein C resistance: Secondary | ICD-10-CM | POA: Diagnosis present

## 2024-02-20 DIAGNOSIS — Z7984 Long term (current) use of oral hypoglycemic drugs: Secondary | ICD-10-CM | POA: Diagnosis not present

## 2024-02-20 DIAGNOSIS — R600 Localized edema: Secondary | ICD-10-CM | POA: Diagnosis not present

## 2024-02-20 DIAGNOSIS — K6811 Postprocedural retroperitoneal abscess: Secondary | ICD-10-CM | POA: Diagnosis not present

## 2024-02-20 DIAGNOSIS — Z7982 Long term (current) use of aspirin: Secondary | ICD-10-CM | POA: Diagnosis not present

## 2024-02-20 DIAGNOSIS — E669 Obesity, unspecified: Secondary | ICD-10-CM | POA: Diagnosis present

## 2024-02-20 DIAGNOSIS — Z833 Family history of diabetes mellitus: Secondary | ICD-10-CM | POA: Diagnosis not present

## 2024-02-20 DIAGNOSIS — I10 Essential (primary) hypertension: Secondary | ICD-10-CM | POA: Diagnosis present

## 2024-02-20 DIAGNOSIS — Z79899 Other long term (current) drug therapy: Secondary | ICD-10-CM | POA: Diagnosis not present

## 2024-02-20 DIAGNOSIS — E870 Hyperosmolality and hypernatremia: Secondary | ICD-10-CM | POA: Diagnosis not present

## 2024-02-20 DIAGNOSIS — K56609 Unspecified intestinal obstruction, unspecified as to partial versus complete obstruction: Principal | ICD-10-CM

## 2024-02-20 DIAGNOSIS — Z825 Family history of asthma and other chronic lower respiratory diseases: Secondary | ICD-10-CM | POA: Diagnosis not present

## 2024-02-20 DIAGNOSIS — K5652 Intestinal adhesions [bands] with complete obstruction: Secondary | ICD-10-CM | POA: Diagnosis present

## 2024-02-20 DIAGNOSIS — M7989 Other specified soft tissue disorders: Secondary | ICD-10-CM | POA: Diagnosis not present

## 2024-02-20 DIAGNOSIS — K91872 Postprocedural seroma of a digestive system organ or structure following a digestive system procedure: Secondary | ICD-10-CM | POA: Diagnosis not present

## 2024-02-20 DIAGNOSIS — Z48815 Encounter for surgical aftercare following surgery on the digestive system: Secondary | ICD-10-CM | POA: Diagnosis not present

## 2024-02-20 DIAGNOSIS — K219 Gastro-esophageal reflux disease without esophagitis: Secondary | ICD-10-CM | POA: Diagnosis present

## 2024-02-20 DIAGNOSIS — Z794 Long term (current) use of insulin: Secondary | ICD-10-CM | POA: Diagnosis not present

## 2024-02-20 DIAGNOSIS — E119 Type 2 diabetes mellitus without complications: Secondary | ICD-10-CM | POA: Diagnosis present

## 2024-02-20 DIAGNOSIS — N179 Acute kidney failure, unspecified: Secondary | ICD-10-CM | POA: Diagnosis not present

## 2024-02-20 DIAGNOSIS — Z6832 Body mass index (BMI) 32.0-32.9, adult: Secondary | ICD-10-CM | POA: Diagnosis not present

## 2024-02-20 DIAGNOSIS — Y838 Other surgical procedures as the cause of abnormal reaction of the patient, or of later complication, without mention of misadventure at the time of the procedure: Secondary | ICD-10-CM | POA: Diagnosis not present

## 2024-02-20 DIAGNOSIS — F418 Other specified anxiety disorders: Secondary | ICD-10-CM | POA: Diagnosis not present

## 2024-02-20 DIAGNOSIS — Z8249 Family history of ischemic heart disease and other diseases of the circulatory system: Secondary | ICD-10-CM | POA: Diagnosis not present

## 2024-02-20 DIAGNOSIS — E7800 Pure hypercholesterolemia, unspecified: Secondary | ICD-10-CM | POA: Diagnosis not present

## 2024-02-20 DIAGNOSIS — K567 Ileus, unspecified: Secondary | ICD-10-CM | POA: Diagnosis not present

## 2024-02-20 DIAGNOSIS — K9189 Other postprocedural complications and disorders of digestive system: Secondary | ICD-10-CM | POA: Diagnosis not present

## 2024-02-20 DIAGNOSIS — E440 Moderate protein-calorie malnutrition: Secondary | ICD-10-CM | POA: Diagnosis present

## 2024-02-20 DIAGNOSIS — Z85828 Personal history of other malignant neoplasm of skin: Secondary | ICD-10-CM | POA: Diagnosis not present

## 2024-02-20 DIAGNOSIS — Z86718 Personal history of other venous thrombosis and embolism: Secondary | ICD-10-CM | POA: Diagnosis not present

## 2024-02-20 DIAGNOSIS — E869 Volume depletion, unspecified: Secondary | ICD-10-CM | POA: Diagnosis present

## 2024-02-20 DIAGNOSIS — E876 Hypokalemia: Secondary | ICD-10-CM | POA: Diagnosis not present

## 2024-02-20 DIAGNOSIS — S31109D Unspecified open wound of abdominal wall, unspecified quadrant without penetration into peritoneal cavity, subsequent encounter: Secondary | ICD-10-CM | POA: Diagnosis not present

## 2024-02-20 DIAGNOSIS — K9181 Other intraoperative complications of digestive system: Secondary | ICD-10-CM | POA: Diagnosis present

## 2024-02-20 DIAGNOSIS — Z823 Family history of stroke: Secondary | ICD-10-CM | POA: Diagnosis not present

## 2024-02-20 DIAGNOSIS — N739 Female pelvic inflammatory disease, unspecified: Secondary | ICD-10-CM | POA: Diagnosis not present

## 2024-02-20 DIAGNOSIS — F32A Depression, unspecified: Secondary | ICD-10-CM | POA: Diagnosis present

## 2024-02-20 DIAGNOSIS — T8141XA Infection following a procedure, superficial incisional surgical site, initial encounter: Secondary | ICD-10-CM | POA: Diagnosis not present

## 2024-02-20 DIAGNOSIS — E11649 Type 2 diabetes mellitus with hypoglycemia without coma: Secondary | ICD-10-CM | POA: Diagnosis not present

## 2024-02-20 DIAGNOSIS — E785 Hyperlipidemia, unspecified: Secondary | ICD-10-CM | POA: Diagnosis present

## 2024-02-20 LAB — I-STAT CG4 LACTIC ACID, ED
Lactic Acid, Venous: 0.9 mmol/L (ref 0.5–1.9)
Lactic Acid, Venous: 0.8 mmol/L (ref 0.5–1.9)

## 2024-02-20 LAB — HIV ANTIBODY (ROUTINE TESTING W REFLEX): HIV Screen 4th Generation wRfx: NONREACTIVE

## 2024-02-20 MED ORDER — FLUTICASONE PROPIONATE 50 MCG/ACT NA SUSP
2.0000 | Freq: Every day | NASAL | Status: DC
  Administered 2024-02-23 – 2024-03-03 (×11): 2 via NASAL
  Filled 2024-02-20: qty 16

## 2024-02-20 MED ORDER — LIDOCAINE HCL URETHRAL/MUCOSAL 2 % EX GEL
1.0000 | Freq: Once | CUTANEOUS | Status: AC
  Administered 2024-02-20: 1 via TOPICAL
  Filled 2024-02-20: qty 11

## 2024-02-20 MED ORDER — FENTANYL CITRATE (PF) 50 MCG/ML IJ SOSY: 25.0000 ug | PREFILLED_SYRINGE | Freq: Once | INTRAMUSCULAR | Status: DC

## 2024-02-20 MED ORDER — FENTANYL CITRATE (PF) 50 MCG/ML IJ SOSY
50.0000 ug | PREFILLED_SYRINGE | Freq: Once | INTRAMUSCULAR | Status: AC
  Administered 2024-02-20: 50 ug via INTRAVENOUS
  Filled 2024-02-20: qty 1

## 2024-02-20 MED ORDER — METOPROLOL TARTRATE 5 MG/5ML IV SOLN
5.0000 mg | Freq: Three times a day (TID) | INTRAVENOUS | Status: DC
Start: 2024-02-20 — End: 2024-03-03
  Administered 2024-02-20 – 2024-03-03 (×35): 5 mg via INTRAVENOUS
  Filled 2024-02-20 (×35): qty 5

## 2024-02-20 MED ORDER — IOHEXOL 350 MG/ML SOLN
100.0000 mL | Freq: Once | INTRAVENOUS | Status: AC | PRN
  Administered 2024-02-20: 100 mL via INTRAVENOUS

## 2024-02-20 MED ORDER — DIATRIZOATE MEGLUMINE & SODIUM 66-10 % PO SOLN
90.0000 mL | Freq: Once | ORAL | Status: AC
  Administered 2024-02-20: 90 mL via NASOGASTRIC
  Filled 2024-02-20: qty 90

## 2024-02-20 MED ORDER — ONDANSETRON HCL 4 MG/2ML IJ SOLN
4.0000 mg | Freq: Once | INTRAMUSCULAR | Status: AC
  Administered 2024-02-20: 4 mg via INTRAVENOUS
  Filled 2024-02-20: qty 2

## 2024-02-20 MED ORDER — MORPHINE SULFATE (PF) 2 MG/ML IV SOLN
2.0000 mg | INTRAVENOUS | Status: DC | PRN
  Administered 2024-02-20 – 2024-02-23 (×5): 2 mg via INTRAVENOUS
  Filled 2024-02-20 (×5): qty 1

## 2024-02-20 MED ORDER — MORPHINE SULFATE (PF) 4 MG/ML IV SOLN
4.0000 mg | Freq: Once | INTRAVENOUS | Status: AC
  Administered 2024-02-20: 4 mg via INTRAVENOUS
  Filled 2024-02-20: qty 1

## 2024-02-20 MED ORDER — KCL IN DEXTROSE-NACL 20-5-0.9 MEQ/L-%-% IV SOLN
INTRAVENOUS | Status: DC
  Filled 2024-02-20: qty 1000

## 2024-02-20 MED ORDER — KCL IN DEXTROSE-NACL 20-5-0.9 MEQ/L-%-% IV SOLN
INTRAVENOUS | Status: AC
Start: 2024-02-20 — End: 2024-02-21
  Filled 2024-02-20 (×2): qty 1000

## 2024-02-20 MED ORDER — PROCHLORPERAZINE EDISYLATE 10 MG/2ML IJ SOLN
10.0000 mg | Freq: Four times a day (QID) | INTRAMUSCULAR | Status: DC | PRN
  Administered 2024-02-20 – 2024-03-04 (×8): 10 mg via INTRAVENOUS
  Filled 2024-02-20 (×8): qty 2

## 2024-02-20 MED ORDER — HEPARIN SODIUM (PORCINE) 5000 UNIT/ML IJ SOLN
5000.0000 [IU] | Freq: Three times a day (TID) | INTRAMUSCULAR | Status: DC
  Administered 2024-02-20 – 2024-02-26 (×16): 5000 [IU] via SUBCUTANEOUS
  Filled 2024-02-20 (×17): qty 1

## 2024-02-20 NOTE — Hospital Course (Signed)
  68 year old female history of IBS, GERD, generalized anxiety disorder, essential hypertension, vertigo, DVT and factor V Leiden deficiency not currently on anticoagulation presented emergency department complaining of sudden onset of severe abdominal pain that started 5:30 PM in the evening with associated loose stool and nausea.  Patient denies any fever, vomiting.  At presentation to ED patient is hemodynamically stable.  O2 sat dropped 87% room air currently 97% on 2L.  CT angio chest abdomen pelvis no acute aortic syndrome, no evidence of PE.  High-grade small bowel obstruction with dilation of proximal small bowel 3 cm and decompensated to distal bowel and colon.  No free intraperitoneal gas.  Chest x-ray unremarkable.  In the ED patient has been given IV fentanyl and Zofran.  General surgery has been consulted via secure chat.  Hospitalist has been consulted for further eval for management of small bowel obstruction.

## 2024-02-20 NOTE — Progress Notes (Signed)
 Patient arrived to room 5N04. VS stable, patient oriented to room and call bell in reach.

## 2024-02-20 NOTE — Consult Note (Signed)
 Kristine [name] 11-Mar-1956  [id number].    Requesting MD: Griselda, MD Chief Complaint/Reason for Consult: SBO   HPI:  Trachelle [name] is a 68 y/o F with a past medical history of diabetes, hypertension, factor V Leiden, and DVT not on anticoagulation who presents with acute onset lower abdominal pain.  Patient states that around 5 PM she had a normal BM followed by sharp central lower abdominal pain, abd distention, and diarrhea diarrhea, No flatus x 3 days.  She reports 1 episode of similar, shorter lasting abdominal pain 2 to 3 weeks ago that occurred at urgent care while she was getting worked up for a sinus infection.  Over the last 1 month she states she has been having vague abdominal symptoms like bloating, decreased appetite, irregular bowel function, increased belching and irregular, sometimes explosive bouts of gas and BMs.  She denies a history of small bowel obstruction in the past.  She had first attributed the symptoms to her PCP increasing her metformin  in the spring to help with weight loss.  Abdominal surgeries significant for C-section, tubal ligation and right oophorectomy, and laparoscopic cholecystectomy.  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 She denies tobacco or drug use.  Occasional alcohol use.  Her husband accompanies her at the bedside today.  ROS: Review of Systems  All other systems reviewed and are negative.   Family History  Problem Relation Age of Onset   Graves' disease Mother    Depression Mother    Osteoporosis Mother    Hearing loss Mother    Hypertension Mother    Stroke Mother    Vision loss Mother    Varicose Veins Mother    Heart attack Father        x 3    Diabetes Father    Heart disease Father    Hypertension Father    Vision loss Father    Breast cancer Sister    Hypertension Sister    Colon polyps Brother    Heart attack Brother        x 2   Diabetes Brother    Obesity Brother    Cancer Brother    Heart disease  Brother    Hypertension Brother    Hypertension Brother    Coronary artery disease Brother        with stents    Atrial fibrillation Brother    Hypertension Maternal Grandmother    Stroke Maternal Grandmother    Hypertension Maternal Grandfather    Stroke Maternal Grandfather    Heart disease Paternal Grandmother    Hypertension Paternal Grandmother    Hypertension Paternal Grandfather    Kidney disease Paternal Grandfather    Obesity Paternal Grandfather    Asthma Daughter    Heart disease Paternal Uncle    Heart disease Brother    Hypertension Brother    Heart disease Maternal Uncle    Hypertension Maternal Uncle    Diabetes Paternal Uncle    Heart disease Paternal Uncle    Hypertension Paternal Uncle    Diabetes Paternal Uncle    Heart disease Paternal Uncle    Hypertension Paternal Uncle    Heart disease Paternal Uncle    Hypertension Paternal Uncle    Diabetes Paternal Uncle    Heart disease Paternal Uncle    Hypertension Paternal Uncle    Cancer Sister    Hypertension Sister    Varicose Veins Sister    Anxiety disorder Maternal Aunt  Hypertension Maternal Aunt    Stroke Maternal Aunt    Hypertension Maternal Aunt    Stroke Maternal Aunt    Hypertension Paternal Uncle    COPD Maternal Uncle    Heart disease Maternal Uncle    Colon cancer Neg Hx    Esophageal cancer Neg Hx    Rectal cancer Neg Hx    Stomach cancer Neg Hx     Past Medical History:  Diagnosis Date   Allergy    Percocet, Percodan, Paxil, Zoloft, EES, Augmentin,   Anxiety    Arthritis 2025   Toe/knee   Basal cell cancer    Basal cell carcinoma    face   Cataract 2019   Clotting disorder 2009   Factor 5   Depression 1989   DVT (deep venous thrombosis) (HCC) 05/13/2006   Esophageal erosions    Factor V Leiden    On coag x 1 year ending in 09   Gastritis 05/13/1988   hospital- abd pain   GERD (gastroesophageal reflux disease)    Hx of adenomatous polyp of colon 12/06/2022   5 mm  rectal adenoma repeat colonoscopy 2031    Hyperlipidemia    Hypertension    Radial head fracture 05/13/2009    Past Surgical History:  Procedure Laterality Date   BIOPSY BREAST Right 10/2022   BREAST SURGERY  2006   Ductal Papilloma/2023 breast biopsy-benign/2024 breast biopsy -benign   CESAREAN SECTION  1987   CHOLECYSTECTOMY  01/27/2003   COLONOSCOPY     DILATION AND CURETTAGE OF UTERUS  08/22/2022   MOHS SURGERY  11/10/2009   basal cell carcinoma near eye   OOPHORECTOMY     SHOULDER SURGERY  04/12/2006   TONSILLECTOMY  05/13/1974   UPPER GASTROINTESTINAL ENDOSCOPY      Social History:  reports that she has never smoked. She has never used smokeless tobacco. She reports current alcohol use of about 2.0 standard drinks of alcohol per week. She reports that she does not use drugs.  Allergies:  Allergies  Allergen Reactions   Oxycodone-Acetaminophen     Hallucinations   Paroxetine Hives   Paroxetine Hcl Hives   Sulfa Antibiotics     Gi upset   Amoxicillin-Pot Clavulanate Other (See Comments)    REACTION: GI   Calcium     REACTION: muscle aches   Erythromycin Other (See Comments)    REACTION: GI   Guaifenesin     Feels yucky can tolerate lowest dosage in ER   Naproxen     REACTION: GI   Oxycodone-Acetaminophen     REACTION: reaction not known   Oxycodone-Acetaminophen Other (See Comments)    Other Reaction: Not Assessed   Oxycodone-Aspirin     REACTION: reaction not known   Oxycodone-Aspirin     REACTION: reaction not known    Sertraline Hcl     REACTION: hives   Sertraline Hcl    Sertraline Rash and Other (See Comments)    REACTION: hives    (Not in a hospital admission)    Physical Exam: Blood pressure (!) 144/68, pulse 66, temperature 97.6 F (36.4 C), temperature source Oral, resp. rate 14, height 5' 5.5 (1.664 m), weight 81.6 kg, SpO2 97%. General: Pleasant white female laying on hospital bed, appears stated age, NAD. HEENT: head  -normocephalic, atraumatic; Eyes: PERRLA, no conjunctival injection; Neck- Trachea is midline CV- RRR, normal S1/S2, no M/R/G, no lower extremity edema Pulm- breathing is non-labored on room air Abd- soft, nondistended, tender to palpation in  the epigastric region and right lower quadrant without rebound tenderness or guarding, no hernias or masses, no palpable organomegaly GU- deferred  MSK- UE/LE symmetrical, no cyanosis, clubbing, or edema. Neuro- CN II-XII grossly in tact, no paresthesias. Psych- Alert and Oriented x3 with appropriate affect Skin: warm and dry, no rashes or lesions   Results for orders placed or performed during the hospital encounter of 02/19/24 (from the past 48 hours)  Lipase, blood     Status: None   Collection Time: 02/19/24  7:50 PM  Result Value Ref Range   Lipase 33 11 - 51 U/L    Comment: Performed at [facility], [address], [place], [place] [address]  Comprehensive metabolic panel     Status: Abnormal   Collection Time: 02/19/24  7:50 PM  Result Value Ref Range   Sodium 142 135 - 145 mmol/L   Potassium 4.3 3.5 - 5.1 mmol/L   Chloride 102 98 - 111 mmol/L   CO2 27 22 - 32 mmol/L   Glucose, Bld 157 (H) 70 - 99 mg/dL    Comment: Glucose reference range applies only to samples taken after fasting for at least 8 hours.   BUN 15 8 - 23 mg/dL   Creatinine, Ser 9.06 0.44 - 1.00 mg/dL   Calcium 9.4 8.9 - 89.6 mg/dL   Total Protein 6.3 (L) 6.5 - 8.1 g/dL   Albumin 3.9 3.5 - 5.0 g/dL   AST 18 15 - 41 U/L   ALT 14 0 - 44 U/L   Alkaline Phosphatase 62 38 - 126 U/L   Total Bilirubin 0.5 0.0 - 1.2 mg/dL   GFR, Estimated >39 >39 mL/min    Comment: (NOTE) Calculated using the CKD-EPI Creatinine Equation (2021)    Anion gap 13 5 - 15    Comment: Performed at [facility], [address] [place], [place] [address]  CBC     Status: None   Collection Time: 02/19/24  7:50 PM  Result Value Ref Range   WBC 6.4 4.0 - 10.5 K/uL   RBC 4.89 3.87 -  5.11 MIL/uL   Hemoglobin 13.6 12.0 - 15.0 g/dL   HCT 57.1 63.9 - 53.9 %   MCV 87.5 80.0 - 100.0 fL   MCH 27.8 26.0 - 34.0 pg   MCHC 31.8 30.0 - 36.0 g/dL   RDW 87.2 88.4 - 84.4 %   Platelets 199 150 - 400 K/uL   nRBC 0.0 0.0 - 0.2 %    Comment: Performed at [facility], [address], [place], [place] [address]  Urinalysis, Routine w reflex microscopic -Urine, Clean Catch     Status: None   Collection Time: 02/19/24  7:50 PM  Result Value Ref Range   Color, Urine YELLOW YELLOW   APPearance CLEAR CLEAR   Specific Gravity, Urine 1.018 1.005 - 1.030   pH 8.0 5.0 - 8.0   Glucose, UA NEGATIVE NEGATIVE mg/dL   Hgb urine dipstick NEGATIVE NEGATIVE   Bilirubin Urine NEGATIVE NEGATIVE   Ketones, ur NEGATIVE NEGATIVE mg/dL   Protein, ur NEGATIVE NEGATIVE mg/dL   Nitrite NEGATIVE NEGATIVE   Leukocytes,Ua NEGATIVE NEGATIVE    Comment: Performed at [facility], [address] [place], [place] [address]  I-Stat Lactic Acid     Status: None   Collection Time: 02/20/24  2:49 AM  Result Value Ref Range   Lactic Acid, Venous 0.9 0.5 - 1.9 mmol/L  I-Stat Lactic Acid  Status: None   Collection Time: 02/20/24  5:14 AM  Result Value Ref Range   Lactic Acid, Venous 0.8 0.5 - 1.9 mmol/L   DG Chest Port 1 View Result Date: 02/20/2024 EXAM: 1 VIEW(S) XRAY OF THE CHEST 02/20/2024 06:01:34 AM COMPARISON: 06/05/06. CLINICAL HISTORY: abdominal pain, hypoxia. abdominal pain, hypoxia; rover FINDINGS: LUNGS AND PLEURA: No focal pulmonary opacity. No pulmonary edema. No pleural effusion. No pneumothorax. HEART AND MEDIASTINUM: No acute abnormality of the cardiac and mediastinal silhouettes. BONES AND SOFT TISSUES: No acute osseous abnormality. VISUALIZED UPPER ABDOMEN: Gaseous distention of stomach partially visible. IMPRESSION: 1. No acute cardiopulmonary process. Electronically signed by: Waddell Calk MD 02/20/2024 06:25 AM EDT RP Workstation: [id number]   CT Angio Chest/Abd/Pel for  Dissection W and/or W/WO Result Date: 02/20/2024 EXAM: CT CHEST, ABDOMEN AND PELVIS WITH AND WITHOUT CONTRAST 02/20/2024 03:50:09 AM TECHNIQUE: CT of the chest, abdomen and pelvis was performed with and without the administration of intravenous contrast. Multiplanar reformatted images are provided for review. Automated exposure control, iterative reconstruction, and/or weight based adjustment of the mA/kV was utilized to reduce the radiation dose to as [name] as reasonably achievable. COMPARISON: None available. CLINICAL HISTORY: Acute aortic syndrome (AAS) suspected; severe bilateral lower abdominal pain, history of factor V leiden. Patient complains of sudden onset abdominal pain with radiation to both sides of lower abdomen and diarrhea x 2-3 hours ago, denies nausea/vomiting. FINDINGS: CHEST: MEDIASTINUM AND LYMPH NODES: No intracranial hematoma, dissection, or aneurysm. No significant atherosclerotic calcification. LUNGS AND PLEURA: No pulmonary embolism to the segmental level. Central pulmonary arteries are of normal caliber. No pleural effusion or pneumothorax. ABDOMEN AND PELVIS: LIVER: The liver is unremarkable. GALLBLADDER AND BILE DUCTS: Status post cholecystectomy. No biliary ductal dilatation. SPLEEN: No acute abnormality. PANCREAS: No acute abnormality. ADRENAL GLANDS: No acute abnormality. KIDNEYS, URETERS AND BLADDER: No stones in the kidneys or ureters. No hydronephrosis. No perinephric or periureteral stranding. Urinary bladder is unremarkable. GI AND BOWEL: There is a high-grade mid small bowel obstruction within the right hemipelvis. The small bowel proximal to this point is fluid-filled and dilated up to 3 cm in diameter. Distally, the bowel is decompressed as is the colon. Appendix is absent. REPRODUCTIVE ORGANS: No acute abnormality. PERITONEUM AND RETROPERITONEUM: Mild ascites. No free intraperitoneal gas. VASCULATURE: Thoracoabdominal aorta is normal in course and caliber. ABDOMINAL AND  PELVIS LYMPH NODES: No lymphadenopathy. BONES AND SOFT TISSUES: Osseous structures are age appropriate. No acute bone abnormality. No lytic or blastic bone lesion. IMPRESSION: 1. No evidence of acute aortic syndrome. 2. No pulmonary embolism to the segmental level. 3. High-grade mid small bowel obstruction within the right hemipelvis with proximal small bowel dilation up to 3 cm and decompressed distal bowel and colon. No free intraperitoneal gas. Mild ascites. Electronically signed by: Dorethia Molt MD 02/20/2024 04:02 AM EDT RP Workstation: [id number]      Assessment/Plan SBO, likely related to intra-abdominal adhesions 68 year old female with history of multiple abdominal surgeries who presents with acute onset cramping abdominal pain associated with bloating, decreased flatus, and nausea.  History, labs, imaging, and other provider documentation have all been personally reviewed.  She is hemodynamically stable at present.  No leukocytosis.  CMP yesterday overall unremarkable.  She has some abdominal tenderness without evidence of peritonitis or signs of an acute abdomen.  No emergent surgical needs.  Recommend NG tube placement for decompression followed by the small bowel obstruction protocol.  CCS will follow closely   FEN -n.p.o., IVF per primary, NG to [name] intermittent  wall suction VTE -SCDs, subcu heparin at present ID -none indicated Admit -TRH service  I reviewed nursing notes, ED provider notes, hospitalist notes, last 24 h vitals and pain scores, last 48 h intake and output, last 24 h labs and trends, and last 24 h imaging results.  Almarie GORMAN Pringle, [facility] 02/20/2024, 8:25 AM Please see Amion for pager number during day hours 7:00am-4:30pm or 7:00am -11:30am on weekends

## 2024-02-20 NOTE — ED Notes (Signed)
 Pt desatted to 86% RA after fentanyl administration, put pt on 2L [place] and increased >95%. MD aware

## 2024-02-20 NOTE — ED Provider Notes (Signed)
 [place] EMERGENCY DEPARTMENT AT [facility] Provider Note   CSN: [id number] Arrival date & time: 02/19/24  8086     Patient presents with: Abdominal Pain   Kristine [name] is a 68 y.o. female.   The history is provided by the patient.  Abdominal Pain Kristine [name] is a 68 y.o. female who presents to the Emergency Department complaining of abdominal pain. She presents to the emergency department for evaluation of sudden onset severe lower abdominal pain that started at 530 this evening after having a normal bowel movement followed by some slightly loose stool. The pain radiates to her back. She has associated nausea. No fever, vomiting, dysuria. No prior similar symptoms. She is status post cholecystectomy, right oophorectomy. She has a history of factor five Leiden with prior DVT but is not currently on anticoagulation. Also has a history of hypertension. No history of atrial fibrillation. No prior similar symptoms.     Prior to Admission medications   Medication Sig Start Date End Date Taking? Authorizing Provider  acetaminophen (TYLENOL) 500 MG tablet Take 1,000 mg by mouth every 6 (six) hours as needed.   Yes [provider]  aspirin EC 81 MG tablet Take 81 mg by mouth daily. Swallow whole.   Yes [provider]  buPROPion  (WELLBUTRIN  XL) 300 MG 24 hr tablet Take 1 tablet (300 mg total) by mouth daily. TAKE 1 TABLET BY MOUTH EVERY DAY 12/23/23  Yes Tower, Laine LABOR, MD  Cetirizine HCl (ZYRTEC PO) Take 1 tablet by mouth daily as needed (for allergies). 03/13/20  Yes [provider]  Cholecalciferol (VITAMIN D3 PO) Take 5,000 Units by mouth daily.   Yes [provider]  ciclopirox (PENLAC) 8 % solution Apply 1 application  topically daily. 05/08/21  Yes [provider]  cyclobenzaprine  (FLEXERIL ) 10 MG tablet Take 0.5 tablets (5 mg total) by mouth 3 (three) times daily as needed for muscle spasms. 12/23/23  Yes Tower, Laine LABOR, MD   ESOMEPRAZOLE  MAGNESIUM  PO Take 1 tablet by mouth daily as needed. 02/11/20  Yes [provider]  fluticasone  (FLONASE ) 50 MCG/ACT nasal spray PLACE 2 SPRAYS INTO THE NOSE DAILY AS NEEDED 04/03/18  Yes Tower, Laine LABOR, MD  furosemide  (LASIX ) 20 MG tablet Twice weekly and as needed Patient taking differently: Take 20 mg by mouth See admin instructions. Take 1 tablet by mouth daily as needed or twice weekly as needed for swelling 09/29/23  Yes Okey Vina GAILS, MD  loratadine (CLARITIN) 10 MG tablet Take 10 mg by mouth as needed for allergies. Reported on 05/19/2015   Yes [provider]  metFORMIN  (GLUCOPHAGE -XR) 500 MG 24 hr tablet Take 2 tablets (1,000 mg total) by mouth daily. 01/23/24  Yes Tower, Laine LABOR, MD  metoprolol  succinate (TOPROL -XL) 25 MG 24 hr tablet Take 1 tablet (25 mg total) by mouth daily. Take with or immediately following a meal. 09/29/23 02/19/25 Yes Okey Vina GAILS, MD  Probiotic Product (PROBIOTIC PO) Take 1 tablet by mouth every evening. Align   Yes [provider]  simvastatin  (ZOCOR ) 10 MG tablet TAKE 1 TABLET BY MOUTH EVERY DAY 09/26/23  Yes Tower, Laine LABOR, MD  acyclovir  ointment (ZOVIRAX ) 5 % Apply 1 Application topically every 3 (three) hours. To cold sore as needed Patient not taking: Reported on 02/20/2024 08/28/22   Tower, Laine LABOR, MD  albuterol  (VENTOLIN  HFA) 108 (90 Base) MCG/ACT inhaler Inhale 1-2 puffs into the lungs every 6 (six) hours as needed. Patient not  taking: Reported on 02/20/2024 04/25/23   Corlis Burnard DEL, NP  ALPRAZolam  (XANAX ) 0.25 MG tablet Take 1 tablet (0.25 mg total) by mouth at bedtime as needed for anxiety. Patient not taking: Reported on 02/20/2024 12/11/22   Tower, Laine LABOR, MD  azithromycin  (ZITHROMAX ) 250 MG tablet Take 1 tablet (250 mg total) by mouth daily. Take first 2 tablets together, then 1 every day until finished. Patient not taking: Reported on 02/20/2024 02/05/24   Corlis Burnard DEL, NP  meclizine  (ANTIVERT ) 25 MG tablet Take 1  tablet (25 mg total) by mouth 2 (two) times daily as needed for dizziness. Patient not taking: Reported on 02/20/2024 12/23/23   Tower, Laine LABOR, MD  potassium chloride  (KLOR-CON ) 10 MEQ tablet Take 1 tablet (10 mEq total) by mouth daily. AND AS NEEDED WITH YOUR LASIX  Patient taking differently: Take 10 mEq by mouth See admin instructions. Take 1 tablet by mouth along with Lasix  as needed for swelling 08/20/22   Parthenia Olivia HERO, PA-C    Allergies: Oxycodone-acetaminophen, Paroxetine, Paroxetine hcl, Sulfa antibiotics, Amoxicillin-pot clavulanate, Calcium, Erythromycin, Guaifenesin, Naproxen, Oxycodone-acetaminophen, Oxycodone-acetaminophen, Oxycodone-aspirin, Oxycodone-aspirin, Sertraline hcl, Sertraline hcl, and Sertraline    Review of Systems  Gastrointestinal:  Positive for abdominal pain.  All other systems reviewed and are negative.   Updated Vital Signs BP (!) 144/61   Pulse 75   Temp 97.6 F (36.4 C) (Oral)   Resp 13   Ht 5' 5.5 (1.664 m)   Wt 81.6 kg   SpO2 100%   BMI 29.50 kg/m   Physical Exam Vitals and nursing note reviewed.  Constitutional:      Appearance: She is well-developed.  HENT:     Head: Normocephalic and atraumatic.  Cardiovascular:     Rate and Rhythm: Normal rate and regular rhythm.     Heart sounds: No murmur heard. Pulmonary:     Effort: Pulmonary effort is normal. No respiratory distress.     Breath sounds: Normal breath sounds.  Abdominal:     Palpations: Abdomen is soft.     Tenderness: There is abdominal tenderness. There is no guarding or rebound.     Comments: Generalized abdominal tenderness  Musculoskeletal:        General: No tenderness.     Comments: Nonpitting edema to BLE  Skin:    General: Skin is warm and dry.  Neurological:     Mental Status: She is alert and oriented to person, place, and time.  Psychiatric:        Behavior: Behavior normal.     (all labs ordered are listed, but only abnormal results are displayed) Labs  Reviewed  COMPREHENSIVE METABOLIC PANEL WITH GFR - Abnormal; Notable for the following components:      Result Value   Glucose, Bld 157 (*)    Total Protein 6.3 (*)    All other components within normal limits  LIPASE, BLOOD  CBC  URINALYSIS, ROUTINE W REFLEX MICROSCOPIC  I-STAT CG4 LACTIC ACID, ED  I-STAT CG4 LACTIC ACID, ED    EKG: EKG Interpretation Date/Time:  Friday February 20 2024 02:58:54 EDT Ventricular Rate:  75 PR Interval:  193 QRS Duration:  103 QT Interval:  397 QTC Calculation: 444 R Axis:   16  Text Interpretation: Sinus rhythm RSR' in V1 or V2, right VCD or RVH Confirmed by Griselda Norris [phone]) on 02/20/2024 3:11:43 AM  Radiology: [facility] Chest Port 1 View Result Date: 02/20/2024 EXAM: 1 VIEW(S) XRAY OF THE CHEST 02/20/2024 06:01:34 AM COMPARISON: 06/05/06. CLINICAL  HISTORY: abdominal pain, hypoxia. abdominal pain, hypoxia; rover FINDINGS: LUNGS AND PLEURA: No focal pulmonary opacity. No pulmonary edema. No pleural effusion. No pneumothorax. HEART AND MEDIASTINUM: No acute abnormality of the cardiac and mediastinal silhouettes. BONES AND SOFT TISSUES: No acute osseous abnormality. VISUALIZED UPPER ABDOMEN: Gaseous distention of stomach partially visible. IMPRESSION: 1. No acute cardiopulmonary process. Electronically signed by: Waddell Calk MD 02/20/2024 06:25 AM EDT RP Workstation: [id number]   CT Angio Chest/Abd/Pel for Dissection W and/or W/WO Result Date: 02/20/2024 EXAM: CT CHEST, ABDOMEN AND PELVIS WITH AND WITHOUT CONTRAST 02/20/2024 03:50:09 AM TECHNIQUE: CT of the chest, abdomen and pelvis was performed with and without the administration of intravenous contrast. Multiplanar reformatted images are provided for review. Automated exposure control, iterative reconstruction, and/or weight based adjustment of the mA/kV was utilized to reduce the radiation dose to as low as reasonably achievable. COMPARISON: None available. CLINICAL HISTORY: Acute aortic syndrome  (AAS) suspected; severe bilateral lower abdominal pain, history of factor V leiden. Patient complains of sudden onset abdominal pain with radiation to both sides of lower abdomen and diarrhea x 2-3 hours ago, denies nausea/vomiting. FINDINGS: CHEST: MEDIASTINUM AND LYMPH NODES: No intracranial hematoma, dissection, or aneurysm. No significant atherosclerotic calcification. LUNGS AND PLEURA: No pulmonary embolism to the segmental level. Central pulmonary arteries are of normal caliber. No pleural effusion or pneumothorax. ABDOMEN AND PELVIS: LIVER: The liver is unremarkable. GALLBLADDER AND BILE DUCTS: Status post cholecystectomy. No biliary ductal dilatation. SPLEEN: No acute abnormality. PANCREAS: No acute abnormality. ADRENAL GLANDS: No acute abnormality. KIDNEYS, URETERS AND BLADDER: No stones in the kidneys or ureters. No hydronephrosis. No perinephric or periureteral stranding. Urinary bladder is unremarkable. GI AND BOWEL: There is a high-grade mid small bowel obstruction within the right hemipelvis. The small bowel proximal to this point is fluid-filled and dilated up to 3 cm in diameter. Distally, the bowel is decompressed as is the colon. Appendix is absent. REPRODUCTIVE ORGANS: No acute abnormality. PERITONEUM AND RETROPERITONEUM: Mild ascites. No free intraperitoneal gas. VASCULATURE: Thoracoabdominal aorta is normal in course and caliber. ABDOMINAL AND PELVIS LYMPH NODES: No lymphadenopathy. BONES AND SOFT TISSUES: Osseous structures are age appropriate. No acute bone abnormality. No lytic or blastic bone lesion. IMPRESSION: 1. No evidence of acute aortic syndrome. 2. No pulmonary embolism to the segmental level. 3. High-grade mid small bowel obstruction within the right hemipelvis with proximal small bowel dilation up to 3 cm and decompressed distal bowel and colon. No free intraperitoneal gas. Mild ascites. Electronically signed by: Dorethia Molt MD 02/20/2024 04:02 AM EDT RP Workstation: [id number]      Procedures   Medications Ordered in the ED  fentaNYL (SUBLIMAZE) injection 50 mcg (50 mcg Intravenous Given 02/20/24 0249)  ondansetron (ZOFRAN) injection 4 mg (4 mg Intravenous Given 02/20/24 0249)  iohexol (OMNIPAQUE) 350 MG/ML injection 100 mL (100 mLs Intravenous Contrast Given 02/20/24 0350)  fentaNYL (SUBLIMAZE) injection 50 mcg (50 mcg Intravenous Given 02/20/24 0500)                                    Medical Decision Making Amount and/or Complexity of Data Reviewed Labs: ordered. Radiology: ordered.  Risk Prescription drug management. Decision regarding hospitalization.   Patient with history of hypertension, factor five Leiden not on anticoagulation here for evaluation of sudden onset lower abdominal pain. She does have significant tenderness on examination. Lactic acid is within normal limits. Given her hyper coagulant ability a CTA was  obtained, which is negative for dissection, ischemia. CT is significant for high-grade small bowel obstruction. She has required multiple pain medications in the emergency department. Discussed with Dr. Tanda with general surgery - surgery service will see the patient and consult. Medicine consulted for admission for ongoing care. Patient updated of findings of studies and she is in agreement with admission for ongoing care.     Final diagnoses:  SBO (small bowel obstruction) [facility])    ED Discharge Orders     None          Griselda Norris, MD 02/20/24 [phone]

## 2024-02-20 NOTE — H&P (Signed)
 History and Physical  Kristine [name] FMW:[id number] DOB: 1956/01/19 DOA: 02/19/2024  Referring physician: ER provider PCP: Kristine [name] LABOR, MD  Outpatient Specialists:  Patient coming from: Home  Chief Complaint: Abdominal pain and nausea  HPI: Patient is a 68 year old Caucasian female past medical history significant for factor V Leiden, DVT, gastritis, hyperlipidemia and hypertension.  Past surgical history includes cesarean section, cholecystectomy and oophorectomy, amongst other surgical procedures done in the past.  Patient presents with mid abdominal pain, cramp-like, that started yesterday around 5:30 PM.  Associated with nausea, but no vomiting.  Patient presented to the hospital for further assessment and management.  CT angio of the chest, abdomen and pelvis revealed high-grade mid small bowel obstruction within the right hemipelvis with proximal small bowel dilatation up to 3 cm and decompressed distal bowel and colon.  CT was negative for pulmonary embolism or aortic syndrome.  Lactic acid came back negative.  Hospitalist team was asked to admit patient for further assessment and management.  Surgical team has also been consulted.   ED Course: Vitals on presentation revealed temperature of 98.7, blood pressure 147/86, heart rate of 63 with respiratory rate of 17 and O2 sat of 98%.  CBC revealed hemoglobin of 13.6, WBC of 6.4 and platelet count of 199.  Chemistry reveals sodium of 142, potassium of 4.3, BUN of 15, creatinine of 0.93 with a blood sugar of 157.  UA revealed specific gravity of 1.018.  Hospitalist team has been asked to admit patient.  Pertinent labs: See above documentation.  EKG: Independently reviewed.   Imaging: independently reviewed.   Review of Systems: As in the history of presenting complaints.  Patient reported nausea and abdominal pain.  No other constitutional symptoms.  No vomiting.  Patient has not broken wings since yesterday.  No bowel  movement.  Past Medical History:  Diagnosis Date   Allergy    Percocet, Percodan, Paxil, Zoloft, EES, Augmentin,   Anxiety    Arthritis 2025   Toe/knee   Basal cell cancer    Basal cell carcinoma    face   Cataract 2019   Clotting disorder 2009   Factor 5   Depression 1989   DVT (deep venous thrombosis) (HCC) 05/13/2006   Esophageal erosions    Factor V Leiden    On coag x 1 year ending in 09   Gastritis 05/13/1988   hospital- abd pain   GERD (gastroesophageal reflux disease)    Hx of adenomatous polyp of colon 12/06/2022   5 mm rectal adenoma repeat colonoscopy 2031    Hyperlipidemia    Hypertension    Radial head fracture 05/13/2009    Past Surgical History:  Procedure Laterality Date   BIOPSY BREAST Right 10/2022   BREAST SURGERY  2006   Ductal Papilloma/2023 breast biopsy-benign/2024 breast biopsy -benign   CESAREAN SECTION  1987   CHOLECYSTECTOMY  01/27/2003   COLONOSCOPY     DILATION AND CURETTAGE OF UTERUS  08/22/2022   MOHS SURGERY  11/10/2009   basal cell carcinoma near eye   OOPHORECTOMY     SHOULDER SURGERY  04/12/2006   TONSILLECTOMY  05/13/1974   UPPER GASTROINTESTINAL ENDOSCOPY       reports that she has never smoked. She has never used smokeless tobacco. She reports current alcohol use of about 2.0 standard drinks of alcohol per week. She reports that she does not use drugs.  Allergies  Allergen Reactions   Oxycodone-Acetaminophen     Hallucinations   Paroxetine Hives  Paroxetine Hcl Hives   Sulfa Antibiotics     Gi upset   Amoxicillin-Pot Clavulanate Other (See Comments)    REACTION: GI   Calcium     REACTION: muscle aches   Erythromycin Other (See Comments)    REACTION: GI   Guaifenesin     Feels yucky can tolerate lowest dosage in ER   Naproxen     REACTION: GI   Oxycodone-Acetaminophen     REACTION: reaction not known   Oxycodone-Acetaminophen Other (See Comments)    Other Reaction: Not Assessed   Oxycodone-Aspirin      REACTION: reaction not known   Oxycodone-Aspirin     REACTION: reaction not known    Sertraline Hcl     REACTION: hives   Sertraline Hcl    Sertraline Rash and Other (See Comments)    REACTION: hives    Family History  Problem Relation Age of Onset   Graves' disease Mother    Depression Mother    Osteoporosis Mother    Hearing loss Mother    Hypertension Mother    Stroke Mother    Vision loss Mother    Varicose Veins Mother    Heart attack Father        x 3    Diabetes Father    Heart disease Father    Hypertension Father    Vision loss Father    Breast cancer Sister    Hypertension Sister    Colon polyps Brother    Heart attack Brother        x 2   Diabetes Brother    Obesity Brother    Cancer Brother    Heart disease Brother    Hypertension Brother    Hypertension Brother    Coronary artery disease Brother        with stents    Atrial fibrillation Brother    Hypertension Maternal Grandmother    Stroke Maternal Grandmother    Hypertension Maternal Grandfather    Stroke Maternal Grandfather    Heart disease Paternal Grandmother    Hypertension Paternal Grandmother    Hypertension Paternal Grandfather    Kidney disease Paternal Grandfather    Obesity Paternal Grandfather    Asthma Daughter    Heart disease Paternal Uncle    Heart disease Brother    Hypertension Brother    Heart disease Maternal Uncle    Hypertension Maternal Uncle    Diabetes Paternal Uncle    Heart disease Paternal Uncle    Hypertension Paternal Uncle    Diabetes Paternal Uncle    Heart disease Paternal Uncle    Hypertension Paternal Uncle    Heart disease Paternal Uncle    Hypertension Paternal Uncle    Diabetes Paternal Uncle    Heart disease Paternal Uncle    Hypertension Paternal Uncle    Cancer Sister    Hypertension Sister    Varicose Veins Sister    Anxiety disorder Maternal Aunt    Hypertension Maternal Aunt    Stroke Maternal Aunt    Hypertension Maternal Aunt     Stroke Maternal Aunt    Hypertension Paternal Uncle    COPD Maternal Uncle    Heart disease Maternal Uncle    Colon cancer Neg Hx    Esophageal cancer Neg Hx    Rectal cancer Neg Hx    Stomach cancer Neg Hx      Prior to Admission medications   Medication Sig Start Date End Date Taking? Authorizing Provider  acetaminophen (  TYLENOL) 500 MG tablet Take 1,000 mg by mouth every 6 (six) hours as needed.   Yes [provider]  aspirin EC 81 MG tablet Take 81 mg by mouth daily. Swallow whole.   Yes [provider]  buPROPion  (WELLBUTRIN  XL) 300 MG 24 hr tablet Take 1 tablet (300 mg total) by mouth daily. TAKE 1 TABLET BY MOUTH EVERY DAY 12/23/23  Yes Tower, [name] LABOR, MD  Cetirizine HCl (ZYRTEC PO) Take 1 tablet by mouth daily as needed (for allergies). 03/13/20  Yes [provider]  Cholecalciferol (VITAMIN D3 PO) Take 5,000 Units by mouth daily.   Yes [provider]  ciclopirox (PENLAC) 8 % solution Apply 1 application  topically daily. 05/08/21  Yes [provider]  cyclobenzaprine  (FLEXERIL ) 10 MG tablet Take 0.5 tablets (5 mg total) by mouth 3 (three) times daily as needed for muscle spasms. 12/23/23  Yes Tower, [name] LABOR, MD  ESOMEPRAZOLE  MAGNESIUM  PO Take 1 tablet by mouth daily as needed. 02/11/20  Yes [provider]  fluticasone  (FLONASE ) 50 MCG/ACT nasal spray PLACE 2 SPRAYS INTO THE NOSE DAILY AS NEEDED 04/03/18  Yes Tower, [name] LABOR, MD  furosemide  (LASIX ) 20 MG tablet Twice weekly and as needed Patient taking differently: Take 20 mg by mouth See admin instructions. Take 1 tablet by mouth daily as needed or twice weekly as needed for swelling 09/29/23  Yes Okey Vina GAILS, MD  loratadine (CLARITIN) 10 MG tablet Take 10 mg by mouth as needed for allergies. Reported on 05/19/2015   Yes [provider]  metFORMIN  (GLUCOPHAGE -XR) 500 MG 24 hr tablet Take 2 tablets (1,000 mg total) by mouth daily. 01/23/24  Yes Tower, [name] LABOR, MD  metoprolol   succinate (TOPROL -XL) 25 MG 24 hr tablet Take 1 tablet (25 mg total) by mouth daily. Take with or immediately following a meal. 09/29/23 02/19/25 Yes Okey Vina GAILS, MD  Probiotic Product (PROBIOTIC PO) Take 1 tablet by mouth every evening. Align   Yes [provider]  simvastatin  (ZOCOR ) 10 MG tablet TAKE 1 TABLET BY MOUTH EVERY DAY 09/26/23  Yes Tower, [name] LABOR, MD  acyclovir  ointment (ZOVIRAX ) 5 % Apply 1 Application topically every 3 (three) hours. To cold sore as needed Patient not taking: Reported on 02/20/2024 08/28/22   Tower, [name] LABOR, MD  albuterol  (VENTOLIN  HFA) 108 (90 Base) MCG/ACT inhaler Inhale 1-2 puffs into the lungs every 6 (six) hours as needed. Patient not taking: Reported on 02/20/2024 04/25/23   Corlis Burnard DEL, NP  ALPRAZolam  (XANAX ) 0.25 MG tablet Take 1 tablet (0.25 mg total) by mouth at bedtime as needed for anxiety. Patient not taking: Reported on 02/20/2024 12/11/22   Tower, [name] LABOR, MD  azithromycin  (ZITHROMAX ) 250 MG tablet Take 1 tablet (250 mg total) by mouth daily. Take first 2 tablets together, then 1 every day until finished. Patient not taking: Reported on 02/20/2024 02/05/24   Corlis Burnard DEL, NP  meclizine  (ANTIVERT ) 25 MG tablet Take 1 tablet (25 mg total) by mouth 2 (two) times daily as needed for dizziness. Patient not taking: Reported on 02/20/2024 12/23/23   Tower, [name] LABOR, MD  potassium chloride  (KLOR-CON ) 10 MEQ tablet Take 1 tablet (10 mEq total) by mouth daily. AND AS NEEDED WITH YOUR LASIX  Patient taking differently: Take 10 mEq by mouth See admin instructions. Take 1 tablet by mouth along with Lasix  as needed for swelling 08/20/22   Parthenia Olivia HERO, PA-C    Physical Exam: Vitals:   02/20/24 0606 02/20/24  0615 02/20/24 0630 02/20/24 0645  BP:  (!) 143/53 136/68 (!) 144/61  Pulse:  74 80 75  Resp:  12 19 13   Temp: 97.6 F (36.4 C)     TempSrc: Oral     SpO2:  100% 100% 100%  Weight:      Height:        Constitutional:  Appears calm and  comfortable.  Patient has NG tube to low suction Eyes:  No jaundice.  ENMT:  external ears, nose appear normal Neck:  Neck is supple. No JVD Respiratory:  CTA bilaterally, no w/r/r.  Respiratory effort normal. No retractions or accessory muscle use Cardiovascular:  S1S2 No LE extremity edema   Abdomen:  Abdomen is soft mild vague discomfort.  Organs are difficult to assess. Neurologic:  Awake and alert. Moves all limbs.  Wt Readings from Last 3 Encounters:  02/19/24 81.6 kg  12/23/23 82.3 kg  10/22/23 84.4 kg    I have personally reviewed following labs and imaging studies  Labs on Admission:  CBC: Recent Labs  Lab 02/19/24 1950  WBC 6.4  HGB 13.6  HCT 42.8  MCV 87.5  PLT 199   Basic Metabolic Panel: Recent Labs  Lab 02/19/24 1950  NA 142  K 4.3  CL 102  CO2 27  GLUCOSE 157*  BUN 15  CREATININE 0.93  CALCIUM 9.4   Liver Function Tests: Recent Labs  Lab 02/19/24 1950  AST 18  ALT 14  ALKPHOS 62  BILITOT 0.5  PROT 6.3*  ALBUMIN 3.9   Recent Labs  Lab 02/19/24 1950  LIPASE 33   No results for input(s): AMMONIA in the last 168 hours. Coagulation Profile: No results for input(s): INR, PROTIME in the last 168 hours. Cardiac Enzymes: No results for input(s): CKTOTAL, CKMB, CKMBINDEX, TROPONINI in the last 168 hours. BNP (last 3 results) No results for input(s): PROBNP in the last 8760 hours. HbA1C: No results for input(s): HGBA1C in the last 72 hours. CBG: No results for input(s): GLUCAP in the last 168 hours. Lipid Profile: No results for input(s): CHOL, HDL, LDLCALC, TRIG, CHOLHDL, LDLDIRECT in the last 72 hours. Thyroid  Function Tests: No results for input(s): TSH, T4TOTAL, FREET4, T3FREE, THYROIDAB in the last 72 hours. Anemia Panel: No results for input(s): VITAMINB12, FOLATE, FERRITIN, TIBC, IRON, RETICCTPCT in the last 72 hours. Urine analysis:    Component Value Date/Time    COLORURINE YELLOW 02/19/2024 1950   APPEARANCEUR CLEAR 02/19/2024 1950   LABSPEC 1.018 02/19/2024 1950   PHURINE 8.0 02/19/2024 1950   GLUCOSEU NEGATIVE 02/19/2024 1950   HGBUR NEGATIVE 02/19/2024 1950   HGBUR negative 11/26/2006 1000   BILIRUBINUR NEGATIVE 02/19/2024 1950   KETONESUR NEGATIVE 02/19/2024 1950   PROTEINUR NEGATIVE 02/19/2024 1950   UROBILINOGEN 0.2 11/26/2006 1000   NITRITE NEGATIVE 02/19/2024 1950   LEUKOCYTESUR NEGATIVE 02/19/2024 1950   Sepsis Labs: @LABRCNTIP (procalcitonin:4,lacticidven:4) )No results found for this or any previous visit (from the past 240 hours).    Radiological Exams on Admission: DG Chest Port 1 View Result Date: 02/20/2024 EXAM: 1 VIEW(S) XRAY OF THE CHEST 02/20/2024 06:01:34 AM COMPARISON: 06/05/06. CLINICAL HISTORY: abdominal pain, hypoxia. abdominal pain, hypoxia; rover FINDINGS: LUNGS AND PLEURA: No focal pulmonary opacity. No pulmonary edema. No pleural effusion. No pneumothorax. HEART AND MEDIASTINUM: No acute abnormality of the cardiac and mediastinal silhouettes. BONES AND SOFT TISSUES: No acute osseous abnormality. VISUALIZED UPPER ABDOMEN: Gaseous distention of stomach partially visible. IMPRESSION: 1. No acute cardiopulmonary process. Electronically signed by: Taylor Stroud  MD 02/20/2024 06:25 AM EDT RP Workstation: [id number]   CT Angio Chest/Abd/Pel for Dissection W and/or W/WO Result Date: 02/20/2024 EXAM: CT CHEST, ABDOMEN AND PELVIS WITH AND WITHOUT CONTRAST 02/20/2024 03:50:09 AM TECHNIQUE: CT of the chest, abdomen and pelvis was performed with and without the administration of intravenous contrast. Multiplanar reformatted images are provided for review. Automated exposure control, iterative reconstruction, and/or weight based adjustment of the mA/kV was utilized to reduce the radiation dose to as low as reasonably achievable. COMPARISON: None available. CLINICAL HISTORY: Acute aortic syndrome (AAS) suspected; severe bilateral lower  abdominal pain, history of factor V leiden. Patient complains of sudden onset abdominal pain with radiation to both sides of lower abdomen and diarrhea x 2-3 hours ago, denies nausea/vomiting. FINDINGS: CHEST: MEDIASTINUM AND LYMPH NODES: No intracranial hematoma, dissection, or aneurysm. No significant atherosclerotic calcification. LUNGS AND PLEURA: No pulmonary embolism to the segmental level. Central pulmonary arteries are of normal caliber. No pleural effusion or pneumothorax. ABDOMEN AND PELVIS: LIVER: The liver is unremarkable. GALLBLADDER AND BILE DUCTS: Status post cholecystectomy. No biliary ductal dilatation. SPLEEN: No acute abnormality. PANCREAS: No acute abnormality. ADRENAL GLANDS: No acute abnormality. KIDNEYS, URETERS AND BLADDER: No stones in the kidneys or ureters. No hydronephrosis. No perinephric or periureteral stranding. Urinary bladder is unremarkable. GI AND BOWEL: There is a high-grade mid small bowel obstruction within the right hemipelvis. The small bowel proximal to this point is fluid-filled and dilated up to 3 cm in diameter. Distally, the bowel is decompressed as is the colon. Appendix is absent. REPRODUCTIVE ORGANS: No acute abnormality. PERITONEUM AND RETROPERITONEUM: Mild ascites. No free intraperitoneal gas. VASCULATURE: Thoracoabdominal aorta is normal in course and caliber. ABDOMINAL AND PELVIS LYMPH NODES: No lymphadenopathy. BONES AND SOFT TISSUES: Osseous structures are age appropriate. No acute bone abnormality. No lytic or blastic bone lesion. IMPRESSION: 1. No evidence of acute aortic syndrome. 2. No pulmonary embolism to the segmental level. 3. High-grade mid small bowel obstruction within the right hemipelvis with proximal small bowel dilation up to 3 cm and decompressed distal bowel and colon. No free intraperitoneal gas. Mild ascites. Electronically signed by: Dorethia Molt MD 02/20/2024 04:02 AM EDT RP Workstation: [id number]    EKG: Independently reviewed.    Principal Problem:   SBO (small bowel obstruction) (HCC)   Assessment/Plan Small bowel obstruction: - History of prior abdominal surgeries. - Admit patient for further assessment and management. - Surgical team management consulted. - Patient presented with nausea and abdominal pain. - CT angio chest, abdomen and pelvis revealed small bowel obstruction. - NG tube to low suction. - NPO. - Keep patient adequately hydrated. - Monitor renal function and electrolytes.  Volume depletion: - Adequate hydration.  Hypertension: - Blood pressure is controlled.  History of factor V Leiden and prior DVTs: - Patient was on anticoagulation for 1 year at some point. - DVT prophylaxis with subcutaneous heparin.   DVT prophylaxis: Subcutaneous heparin. Code Status: Full code Family Communication:  Disposition Plan: Home eventually Consults called: Surgery team has been consulted Admission status: Inpatient  Time spent: 85 minutes  Leatrice Chapel, MD  [facility] Pager #: [phone] 7PM-7AM contact night coverage as above  02/20/2024, 7:33 AM

## 2024-02-21 ENCOUNTER — Inpatient Hospital Stay (HOSPITAL_COMMUNITY)

## 2024-02-21 DIAGNOSIS — K56609 Unspecified intestinal obstruction, unspecified as to partial versus complete obstruction: Secondary | ICD-10-CM | POA: Diagnosis not present

## 2024-02-21 LAB — CBC
HCT: 43.3 % (ref 36.0–46.0)
Hemoglobin: 13.8 g/dL (ref 12.0–15.0)
MCH: 28.4 pg (ref 26.0–34.0)
MCHC: 31.9 g/dL (ref 30.0–36.0)
MCV: 89.1 fL (ref 80.0–100.0)
Platelets: 195 K/uL (ref 150–400)
RBC: 4.86 MIL/uL (ref 3.87–5.11)
RDW: 12.8 % (ref 11.5–15.5)
WBC: 10.7 K/uL — ABNORMAL HIGH (ref 4.0–10.5)
nRBC: 0 % (ref 0.0–0.2)

## 2024-02-21 LAB — BASIC METABOLIC PANEL WITH GFR
Anion gap: 9 (ref 5–15)
BUN: 10 mg/dL (ref 8–23)
CO2: 23 mmol/L (ref 22–32)
Calcium: 9 mg/dL (ref 8.9–10.3)
Chloride: 109 mmol/L (ref 98–111)
Creatinine, Ser: 0.7 mg/dL (ref 0.44–1.00)
GFR, Estimated: >60 mL/min (ref >60)
Glucose, Bld: 186 mg/dL — ABNORMAL HIGH (ref 70–99)
Potassium: 4.7 mmol/L (ref 3.5–5.1)
Sodium: 141 mmol/L (ref 135–145)

## 2024-02-21 LAB — GLUCOSE, CAPILLARY: Glucose-Capillary: 162 mg/dL — ABNORMAL HIGH (ref 70–99)

## 2024-02-21 MED ORDER — INSULIN ASPART 100 UNIT/ML IJ SOLN
0.0000 [IU] | INTRAMUSCULAR | Status: DC
  Administered 2024-02-21: 2 [IU] via SUBCUTANEOUS
  Administered 2024-02-22 (×2): 1 [IU] via SUBCUTANEOUS
  Administered 2024-02-22: 2 [IU] via SUBCUTANEOUS
  Administered 2024-02-22 (×2): 1 [IU] via SUBCUTANEOUS
  Administered 2024-02-22: 2 [IU] via SUBCUTANEOUS
  Administered 2024-02-23 (×4): 1 [IU] via SUBCUTANEOUS
  Administered 2024-02-24: 3 [IU] via SUBCUTANEOUS
  Administered 2024-02-24: 1 [IU] via SUBCUTANEOUS
  Administered 2024-02-24 (×2): 2 [IU] via SUBCUTANEOUS
  Administered 2024-02-24 – 2024-02-26 (×3): 1 [IU] via SUBCUTANEOUS
  Administered 2024-02-26: 2 [IU] via SUBCUTANEOUS
  Administered 2024-02-26 – 2024-02-27 (×4): 1 [IU] via SUBCUTANEOUS
  Administered 2024-02-28 (×2): 2 [IU] via SUBCUTANEOUS
  Administered 2024-02-28: 3 [IU] via SUBCUTANEOUS
  Administered 2024-02-28: 2 [IU] via SUBCUTANEOUS
  Administered 2024-02-28: 1 [IU] via SUBCUTANEOUS
  Administered 2024-02-28 (×2): 2 [IU] via SUBCUTANEOUS
  Administered 2024-02-29 (×2): 3 [IU] via SUBCUTANEOUS
  Administered 2024-02-29: 2 [IU] via SUBCUTANEOUS
  Administered 2024-02-29: 3 [IU] via SUBCUTANEOUS
  Administered 2024-02-29 – 2024-03-01 (×2): 2 [IU] via SUBCUTANEOUS
  Administered 2024-03-01: 5 [IU] via SUBCUTANEOUS
  Administered 2024-03-01 (×3): 2 [IU] via SUBCUTANEOUS
  Administered 2024-03-01: 3 [IU] via SUBCUTANEOUS
  Administered 2024-03-01 – 2024-03-02 (×5): 2 [IU] via SUBCUTANEOUS
  Administered 2024-03-02: 3 [IU] via SUBCUTANEOUS
  Administered 2024-03-03: 2 [IU] via SUBCUTANEOUS
  Administered 2024-03-03 (×2): 1 [IU] via SUBCUTANEOUS

## 2024-02-21 MED ORDER — KCL IN DEXTROSE-NACL 20-5-0.9 MEQ/L-%-% IV SOLN
INTRAVENOUS | Status: DC
  Filled 2024-02-21 (×3): qty 1000

## 2024-02-21 NOTE — Progress Notes (Signed)
 Subjective: Had some pain meds a little bit ago.  No flatus.  Vomiting around NGT some overnight.  NGT with minimal output.  Flushed and this started working better with some thick bilious output that is intermittently clogging the tube.   ROS: See above, otherwise other systems negative  Objective: Vital signs in last 24 hours: Temp:  [97.7 F (36.5 C)-98.3 F (36.8 C)] 98.2 F (36.8 C) (10/11 0752) Pulse Rate:  [65-75] 67 (10/11 0752) Resp:  [16-27] 19 (10/11 0752) BP: (148-160)/(65-85) 155/85 (10/11 0752) SpO2:  [93 %-100 %] 93 % (10/11 0752) Last BM Date : 02/17/24 (per patient)  Intake/Output from previous day: 10/10 0701 - 10/11 0700 In: 44.1 [I.V.:44.1] Out: 600 [Emesis/NG output:250] Intake/Output this shift: No intake/output data recorded.  PE: Gen: NAD Abd: soft, tender centrally in her mid, upper, and lower abdomen.  No guarding or peritonitis.  NGT with thick bilious output, but minimal right now  Lab Results:  Recent Labs    02/19/24 1950 02/21/24 0750  WBC 6.4 10.7*  HGB 13.6 13.8  HCT 42.8 43.3  PLT 199 195   BMET Recent Labs    02/19/24 1950 02/21/24 0750  NA 142 141  K 4.3 4.7  CL 102 109  CO2 27 23  GLUCOSE 157* 186*  BUN 15 10  CREATININE 0.93 0.70  CALCIUM 9.4 9.0   PT/INR No results for input(s): LABPROT, INR in the last 72 hours. CMP     Component Value Date/Time   NA 141 02/21/2024 0750   NA 143 01/08/2018 1605   K 4.7 02/21/2024 0750   CL 109 02/21/2024 0750   CO2 23 02/21/2024 0750   GLUCOSE 186 (H) 02/21/2024 0750   BUN 10 02/21/2024 0750   BUN 12 01/08/2018 1605   CREATININE 0.70 02/21/2024 0750   CALCIUM 9.0 02/21/2024 0750   PROT 6.3 (L) 02/19/2024 1950   ALBUMIN 3.9 02/19/2024 1950   AST 18 02/19/2024 1950   ALT 14 02/19/2024 1950   ALKPHOS 62 02/19/2024 1950   BILITOT 0.5 02/19/2024 1950   GFRNONAA >60 02/21/2024 0750   GFRAA 107 01/08/2018 1605   Lipase     Component Value Date/Time   LIPASE  33 02/19/2024 1950       Studies/Results: DG Abd Portable 1V-Small Bowel Obstruction Protocol-initial, 8 hr delay Result Date: 02/20/2024 CLINICAL DATA:  8 hour small-bowel follow up EXAM: PORTABLE ABDOMEN - 1 VIEW COMPARISON:  Film from earlier in the same day. FINDINGS: Gastric catheter is again noted in the stomach. Some retained contrast is noted in the gastric fundus. Administered contrast lies within the mildly dilated small bowel loops centrally. No significant colonic contrast is seen to suggest partial small bowel obstruction. 24 hour follow-up is recommended. IMPRESSION: Contrast is noted within dilated loops of small bowel centrally. No significant colonic contrast is seen. 24 hour follow-up film is recommended. Electronically Signed   By: Oneil Devonshire M.D.   On: 02/20/2024 19:00   DG Abd 1 View Result Date: 02/20/2024 CLINICAL DATA:  Nasogastric tube placement. EXAM: DG ABDOMEN 1V COMPARISON:  Chest, abdomen and pelvis CTA dated 02/20/2024 FINDINGS: Nasogastric tube tip and side hole in the proximal stomach. Normal bowel-gas pattern. Excreted contrast in the renal collecting systems. Lower lumbar spine degenerative changes. Cholecystectomy clips. IMPRESSION: Nasogastric tube tip and side hole in the proximal stomach. Electronically Signed   By: Elspeth Bathe M.D.   On: 02/20/2024 09:45   DG Chest [facility]  1 View Result Date: 02/20/2024 EXAM: 1 VIEW(S) XRAY OF THE CHEST 02/20/2024 06:01:34 AM COMPARISON: 06/05/06. CLINICAL HISTORY: abdominal pain, hypoxia. abdominal pain, hypoxia; rover FINDINGS: LUNGS AND PLEURA: No focal pulmonary opacity. No pulmonary edema. No pleural effusion. No pneumothorax. HEART AND MEDIASTINUM: No acute abnormality of the cardiac and mediastinal silhouettes. BONES AND SOFT TISSUES: No acute osseous abnormality. VISUALIZED UPPER ABDOMEN: Gaseous distention of stomach partially visible. IMPRESSION: 1. No acute cardiopulmonary process. Electronically signed by: Waddell Calk MD 02/20/2024 06:25 AM EDT RP Workstation: [id number]   CT Angio Chest/Abd/Pel for Dissection W and/or W/WO Result Date: 02/20/2024 EXAM: CT CHEST, ABDOMEN AND PELVIS WITH AND WITHOUT CONTRAST 02/20/2024 03:50:09 AM TECHNIQUE: CT of the chest, abdomen and pelvis was performed with and without the administration of intravenous contrast. Multiplanar reformatted images are provided for review. Automated exposure control, iterative reconstruction, and/or weight based adjustment of the mA/kV was utilized to reduce the radiation dose to as low as reasonably achievable. COMPARISON: None available. CLINICAL HISTORY: Acute aortic syndrome (AAS) suspected; severe bilateral lower abdominal pain, history of factor V leiden. Patient complains of sudden onset abdominal pain with radiation to both sides of lower abdomen and diarrhea x 2-3 hours ago, denies nausea/vomiting. FINDINGS: CHEST: MEDIASTINUM AND LYMPH NODES: No intracranial hematoma, dissection, or aneurysm. No significant atherosclerotic calcification. LUNGS AND PLEURA: No pulmonary embolism to the segmental level. Central pulmonary arteries are of normal caliber. No pleural effusion or pneumothorax. ABDOMEN AND PELVIS: LIVER: The liver is unremarkable. GALLBLADDER AND BILE DUCTS: Status post cholecystectomy. No biliary ductal dilatation. SPLEEN: No acute abnormality. PANCREAS: No acute abnormality. ADRENAL GLANDS: No acute abnormality. KIDNEYS, URETERS AND BLADDER: No stones in the kidneys or ureters. No hydronephrosis. No perinephric or periureteral stranding. Urinary bladder is unremarkable. GI AND BOWEL: There is a high-grade mid small bowel obstruction within the right hemipelvis. The small bowel proximal to this point is fluid-filled and dilated up to 3 cm in diameter. Distally, the bowel is decompressed as is the colon. Appendix is absent. REPRODUCTIVE ORGANS: No acute abnormality. PERITONEUM AND RETROPERITONEUM: Mild ascites. No free intraperitoneal  gas. VASCULATURE: Thoracoabdominal aorta is normal in course and caliber. ABDOMINAL AND PELVIS LYMPH NODES: No lymphadenopathy. BONES AND SOFT TISSUES: Osseous structures are age appropriate. No acute bone abnormality. No lytic or blastic bone lesion. IMPRESSION: 1. No evidence of acute aortic syndrome. 2. No pulmonary embolism to the segmental level. 3. High-grade mid small bowel obstruction within the right hemipelvis with proximal small bowel dilation up to 3 cm and decompressed distal bowel and colon. No free intraperitoneal gas. Mild ascites. Electronically signed by: Dorethia Molt MD 02/20/2024 04:02 AM EDT RP Workstation: [id number]    Anti-infectives: Anti-infectives (From admission, onward)    None        Assessment/Plan  SBO, likely related to intra-abdominal adhesions  -8-hr delay with contrast still in her stomach -repeat films this morning just reviewed and appears worsening with loops of small bowel much more dilated than last night -flushed NGT and trying to get it to work better to help with decompression.  Flush NGT q 4 hrs -she does not have guarding or peritonitis, but concerned with worsening pattern that she may need surgical intervention if she doesn't start resolving -will continue to closely monitor -may have some ice chips  FEN - NPO/NGT/ice chips/IVFs per [facility] VTE - heparin ID - none  Factor 5 Leiden deficiency H/o DVT HLD HTN  I reviewed hospitalist notes, last 24 h vitals and pain scores, last  48 h intake and output, last 24 h labs and trends, and last 24 h imaging results.   LOS: 1 day    Burnard FORBES Banter , [facility] Surgery 02/21/2024, 9:36 AM Please see Amion for pager number during day hours 7:00am-4:30pm or 7:00am -11:30am on weekends

## 2024-02-21 NOTE — Progress Notes (Signed)
 PROGRESS NOTE    Kristine [name]  FMW:[id number] DOB: 1955/07/02 DOA: 02/19/2024 PCP: [name] LABOR, MD  Outpatient Specialists:     Brief Narrative:  As per H&P done on presentation: Patient is a 68 year old Caucasian female past medical history significant for factor V Leiden, DVT, gastritis, hyperlipidemia and hypertension.  Past surgical history includes cesarean section, cholecystectomy and oophorectomy, amongst other surgical procedures done in the past.  Patient presents with mid abdominal pain, cramp-like, that started yesterday around 5:30 PM.  Associated with nausea, but no vomiting.  Patient presented to the hospital for further assessment and management.  CT angio of the chest, abdomen and pelvis revealed high-grade mid small bowel obstruction within the right hemipelvis with proximal small bowel dilatation up to 3 cm and decompressed distal bowel and colon.  CT was negative for pulmonary embolism or aortic syndrome.  Lactic acid came back negative.  Hospitalist team was asked to admit patient for further assessment and management.  Surgical team has also been consulted.     ED Course: Vitals on presentation revealed temperature of 98.7, blood pressure 147/86, heart rate of 63 with respiratory rate of 17 and O2 sat of 98%.  CBC revealed hemoglobin of 13.6, WBC of 6.4 and platelet count of 199.  Chemistry reveals sodium of 142, potassium of 4.3, BUN of 15, creatinine of 0.93 with a blood sugar of 157.  UA revealed specific gravity of 1.018.  Hospitalist team has been asked to admit patient.  02/21/2024: Patient seen alongside patient's husband.  Patient has not broken wound.  No bowel movement.  Input from the surgery team is highly appreciated.  Patient had nausea and vomiting earlier today.  Apparently, NG tube may have been clogged.  NG tube none gets flushed every 4 hours.  Patient is being managed expectantly.  Abdominal x-ray done today revealed revealed worsening  SBO.   Assessment & Plan:   Principal Problem:   SBO (small bowel obstruction) (HCC)  Small bowel obstruction: - History of prior abdominal surgeries. - Surgical team management consulted. - Patient presented with nausea and abdominal pain. - CT angio chest, abdomen and pelvis revealed small bowel obstruction. - NG tube to low suction.  Above documentation - NPO. - Keep patient adequately hydrated.  Continue IV fluids - Continue to monitor renal function and electrolytes.   Volume depletion: - Continue adequate hydration.   Hypertension: - Continue to optimize.   History of factor V Leiden and prior DVTs: - Patient was on anticoagulation for 1 year at some point. - DVT prophylaxis with subcutaneous heparin.   DVT prophylaxis: Subcutaneous heparin Code Status: Full code Family Communication: Husband by bedside Disposition Plan: Remains inpatient   Consultants:  Surgery team  Procedures:  NG tube placement  Antimicrobials:  None   Subjective: No bowel movement. Worsening SBO. Nausea and vomiting earlier.  Objective: Vitals:   02/20/24 2006 02/21/24 0503 02/21/24 0752 02/21/24 1411  BP: (!) 148/68 (!) 156/65 (!) 155/85 (!) 163/70  Pulse:  65 67 67  Resp: 16 16 19 19   Temp: 98.1 F (36.7 C) 98.3 F (36.8 C) 98.2 F (36.8 C) 98.9 F (37.2 C)  TempSrc: Oral Oral Oral   SpO2: 95% 94% 93% 97%  Weight:      Height:        Intake/Output Summary (Last 24 hours) at 02/21/2024 1723 Last data filed at 02/21/2024 0700 Gross per 24 hour  Intake --  Output 250 ml  Net -250 ml  Filed Weights   02/19/24 1931  Weight: 81.6 kg    Examination:  General exam: Appears calm and comfortable  Respiratory system: Clear to auscultation. Respiratory effort normal. Cardiovascular system: S1 & S2 heard Gastrointestinal system: Abdomen is soft and nontender.  Proactive bowel sounds. Central nervous system: Alert and oriented.   Data Reviewed: I have personally  reviewed following labs and imaging studies  CBC: Recent Labs  Lab 02/19/24 1950 02/21/24 0750  WBC 6.4 10.7*  HGB 13.6 13.8  HCT 42.8 43.3  MCV 87.5 89.1  PLT 199 195   Basic Metabolic Panel: Recent Labs  Lab 02/19/24 1950 02/21/24 0750  NA 142 141  K 4.3 4.7  CL 102 109  CO2 27 23  GLUCOSE 157* 186*  BUN 15 10  CREATININE 0.93 0.70  CALCIUM 9.4 9.0   GFR: Estimated Creatinine Clearance: 70.8 mL/min (by C-G formula based on SCr of 0.7 mg/dL). Liver Function Tests: Recent Labs  Lab 02/19/24 1950  AST 18  ALT 14  ALKPHOS 62  BILITOT 0.5  PROT 6.3*  ALBUMIN 3.9   Recent Labs  Lab 02/19/24 1950  LIPASE 33   No results for input(s): AMMONIA in the last 168 hours. Coagulation Profile: No results for input(s): INR, PROTIME in the last 168 hours. Cardiac Enzymes: No results for input(s): CKTOTAL, CKMB, CKMBINDEX, TROPONINI in the last 168 hours. BNP (last 3 results) No results for input(s): PROBNP in the last 8760 hours. HbA1C: No results for input(s): HGBA1C in the last 72 hours. CBG: No results for input(s): GLUCAP in the last 168 hours. Lipid Profile: No results for input(s): CHOL, HDL, LDLCALC, TRIG, CHOLHDL, LDLDIRECT in the last 72 hours. Thyroid  Function Tests: No results for input(s): TSH, T4TOTAL, FREET4, T3FREE, THYROIDAB in the last 72 hours. Anemia Panel: No results for input(s): VITAMINB12, FOLATE, FERRITIN, TIBC, IRON, RETICCTPCT in the last 72 hours. Urine analysis:    Component Value Date/Time   COLORURINE YELLOW 02/19/2024 1950   APPEARANCEUR CLEAR 02/19/2024 1950   LABSPEC 1.018 02/19/2024 1950   PHURINE 8.0 02/19/2024 1950   GLUCOSEU NEGATIVE 02/19/2024 1950   HGBUR NEGATIVE 02/19/2024 1950   HGBUR negative 11/26/2006 1000   BILIRUBINUR NEGATIVE 02/19/2024 1950   KETONESUR NEGATIVE 02/19/2024 1950   PROTEINUR NEGATIVE 02/19/2024 1950   UROBILINOGEN 0.2 11/26/2006 1000    NITRITE NEGATIVE 02/19/2024 1950   LEUKOCYTESUR NEGATIVE 02/19/2024 1950   Sepsis Labs: @LABRCNTIP (procalcitonin:4,lacticidven:4)  )No results found for this or any previous visit (from the past 240 hours).       Radiology Studies: DG Abd Portable 1V Result Date: 02/21/2024 EXAM: 1 VIEW XRAY OF THE ABDOMEN 02/21/2024 09:30:00 AM COMPARISON: 02/20/2024 CLINICAL HISTORY: SBO (small bowel obstruction) (HCC) 881154. Reason for exam: SBO. FINDINGS: LINES, TUBES AND DEVICES: Gastric decompression tube in place in the gastric fundus. New enteric contrast material within the gastric fundus. BOWEL: Dilation of small bowel loops within the central abdomen, up to 4.5 cm in diameter compared to 3.3 cm previously, consistent with small bowel obstruction. SOFT TISSUES: Right upper quadrant surgical clips noted. No opaque urinary calculi. BONES: No acute osseous abnormality. IMPRESSION: 1. Small bowel obstruction with progressive dilation of small bowel loops up to 4.5 cm, increased from 3.3 cm previously. 2. Gastric decompression tube terminates in the gastric fundus. Electronically signed by: Waddell Calk MD 02/21/2024 10:05 AM EDT RP Workstation: [id number]   DG Abd Portable 1V-Small Bowel Obstruction Protocol-initial, 8 hr delay Result Date: 02/20/2024 CLINICAL DATA:  8 hour small-bowel follow  up EXAM: PORTABLE ABDOMEN - 1 VIEW COMPARISON:  Film from earlier in the same day. FINDINGS: Gastric catheter is again noted in the stomach. Some retained contrast is noted in the gastric fundus. Administered contrast lies within the mildly dilated small bowel loops centrally. No significant colonic contrast is seen to suggest partial small bowel obstruction. 24 hour follow-up is recommended. IMPRESSION: Contrast is noted within dilated loops of small bowel centrally. No significant colonic contrast is seen. 24 hour follow-up film is recommended. Electronically Signed   By: Oneil Devonshire M.D.   On: 02/20/2024 19:00    DG Abd 1 View Result Date: 02/20/2024 CLINICAL DATA:  Nasogastric tube placement. EXAM: DG ABDOMEN 1V COMPARISON:  Chest, abdomen and pelvis CTA dated 02/20/2024 FINDINGS: Nasogastric tube tip and side hole in the proximal stomach. Normal bowel-gas pattern. Excreted contrast in the renal collecting systems. Lower lumbar spine degenerative changes. Cholecystectomy clips. IMPRESSION: Nasogastric tube tip and side hole in the proximal stomach. Electronically Signed   By: Elspeth Bathe M.D.   On: 02/20/2024 09:45   DG Chest Port 1 View Result Date: 02/20/2024 EXAM: 1 VIEW(S) XRAY OF THE CHEST 02/20/2024 06:01:34 AM COMPARISON: 06/05/06. CLINICAL HISTORY: abdominal pain, hypoxia. abdominal pain, hypoxia; rover FINDINGS: LUNGS AND PLEURA: No focal pulmonary opacity. No pulmonary edema. No pleural effusion. No pneumothorax. HEART AND MEDIASTINUM: No acute abnormality of the cardiac and mediastinal silhouettes. BONES AND SOFT TISSUES: No acute osseous abnormality. VISUALIZED UPPER ABDOMEN: Gaseous distention of stomach partially visible. IMPRESSION: 1. No acute cardiopulmonary process. Electronically signed by: Waddell Calk MD 02/20/2024 06:25 AM EDT RP Workstation: [id number]   CT Angio Chest/Abd/Pel for Dissection W and/or W/WO Result Date: 02/20/2024 EXAM: CT CHEST, ABDOMEN AND PELVIS WITH AND WITHOUT CONTRAST 02/20/2024 03:50:09 AM TECHNIQUE: CT of the chest, abdomen and pelvis was performed with and without the administration of intravenous contrast. Multiplanar reformatted images are provided for review. Automated exposure control, iterative reconstruction, and/or weight based adjustment of the mA/kV was utilized to reduce the radiation dose to as low as reasonably achievable. COMPARISON: None available. CLINICAL HISTORY: Acute aortic syndrome (AAS) suspected; severe bilateral lower abdominal pain, history of factor V leiden. Patient complains of sudden onset abdominal pain with radiation to both sides of  lower abdomen and diarrhea x 2-3 hours ago, denies nausea/vomiting. FINDINGS: CHEST: MEDIASTINUM AND LYMPH NODES: No intracranial hematoma, dissection, or aneurysm. No significant atherosclerotic calcification. LUNGS AND PLEURA: No pulmonary embolism to the segmental level. Central pulmonary arteries are of normal caliber. No pleural effusion or pneumothorax. ABDOMEN AND PELVIS: LIVER: The liver is unremarkable. GALLBLADDER AND BILE DUCTS: Status post cholecystectomy. No biliary ductal dilatation. SPLEEN: No acute abnormality. PANCREAS: No acute abnormality. ADRENAL GLANDS: No acute abnormality. KIDNEYS, URETERS AND BLADDER: No stones in the kidneys or ureters. No hydronephrosis. No perinephric or periureteral stranding. Urinary bladder is unremarkable. GI AND BOWEL: There is a high-grade mid small bowel obstruction within the right hemipelvis. The small bowel proximal to this point is fluid-filled and dilated up to 3 cm in diameter. Distally, the bowel is decompressed as is the colon. Appendix is absent. REPRODUCTIVE ORGANS: No acute abnormality. PERITONEUM AND RETROPERITONEUM: Mild ascites. No free intraperitoneal gas. VASCULATURE: Thoracoabdominal aorta is normal in course and caliber. ABDOMINAL AND PELVIS LYMPH NODES: No lymphadenopathy. BONES AND SOFT TISSUES: Osseous structures are age appropriate. No acute bone abnormality. No lytic or blastic bone lesion. IMPRESSION: 1. No evidence of acute aortic syndrome. 2. No pulmonary embolism to the segmental level. 3. High-grade mid  small bowel obstruction within the right hemipelvis with proximal small bowel dilation up to 3 cm and decompressed distal bowel and colon. No free intraperitoneal gas. Mild ascites. Electronically signed by: Dorethia Molt MD 02/20/2024 04:02 AM EDT RP Workstation: [id number]        Scheduled Meds:  fentaNYL (SUBLIMAZE) injection  25 mcg Intravenous Once   fluticasone   2 spray Each Nare Daily   heparin  5,000 Units Subcutaneous  Q8H   insulin aspart  0-9 Units Subcutaneous Q4H   metoprolol  tartrate  5 mg Intravenous Q8H   Continuous Infusions:  dextrose 5 % and 0.9 % NaCl with KCl 20 mEq/L 75 mL/hr at 02/20/24 2347     LOS: 1 day    Time spent: 35 minutes.    Leatrice Chapel, MD  [facility] Pager #: [phone] 7PM-7AM contact night coverage as above

## 2024-02-22 ENCOUNTER — Inpatient Hospital Stay (HOSPITAL_COMMUNITY)

## 2024-02-22 DIAGNOSIS — K56609 Unspecified intestinal obstruction, unspecified as to partial versus complete obstruction: Secondary | ICD-10-CM | POA: Diagnosis not present

## 2024-02-22 LAB — GLUCOSE, CAPILLARY
Glucose-Capillary: 134 mg/dL — ABNORMAL HIGH (ref 70–99)
Glucose-Capillary: 138 mg/dL — ABNORMAL HIGH (ref 70–99)
Glucose-Capillary: 140 mg/dL — ABNORMAL HIGH (ref 70–99)
Glucose-Capillary: 141 mg/dL — ABNORMAL HIGH (ref 70–99)
Glucose-Capillary: 143 mg/dL — ABNORMAL HIGH (ref 70–99)
Glucose-Capillary: 162 mg/dL — ABNORMAL HIGH (ref 70–99)
Glucose-Capillary: 185 mg/dL — ABNORMAL HIGH (ref 70–99)

## 2024-02-22 LAB — CBC WITH DIFFERENTIAL/PLATELET
Abs Immature Granulocytes: 0.03 K/uL (ref 0.00–0.07)
Basophils Absolute: 0 K/uL (ref 0.0–0.1)
Basophils Relative: 0 %
Eosinophils Absolute: 0 K/uL (ref 0.0–0.5)
Eosinophils Relative: 0 %
HCT: 41.9 % (ref 36.0–46.0)
Hemoglobin: 13.5 g/dL (ref 12.0–15.0)
Immature Granulocytes: 0 %
Lymphocytes Relative: 11 %
Lymphs Abs: 1.2 K/uL (ref 0.7–4.0)
MCH: 28.3 pg (ref 26.0–34.0)
MCHC: 32.2 g/dL (ref 30.0–36.0)
MCV: 87.8 fL (ref 80.0–100.0)
Monocytes Absolute: 0.8 K/uL (ref 0.1–1.0)
Monocytes Relative: 7 %
Neutro Abs: 8.4 K/uL — ABNORMAL HIGH (ref 1.7–7.7)
Neutrophils Relative %: 82 %
Platelets: 166 K/uL (ref 150–400)
RBC: 4.77 MIL/uL (ref 3.87–5.11)
RDW: 12.8 % (ref 11.5–15.5)
WBC: 10.4 K/uL (ref 4.0–10.5)
nRBC: 0 % (ref 0.0–0.2)

## 2024-02-22 LAB — RENAL FUNCTION PANEL
Albumin: 3.2 g/dL — ABNORMAL LOW (ref 3.5–5.0)
Anion gap: 9 (ref 5–15)
BUN: 12 mg/dL (ref 8–23)
CO2: 25 mmol/L (ref 22–32)
Calcium: 9 mg/dL (ref 8.9–10.3)
Chloride: 108 mmol/L (ref 98–111)
Creatinine, Ser: 0.66 mg/dL (ref 0.44–1.00)
GFR, Estimated: >60 mL/min (ref >60)
Glucose, Bld: 181 mg/dL — ABNORMAL HIGH (ref 70–99)
Phosphorus: 3.2 mg/dL (ref 2.5–4.6)
Potassium: 3.7 mmol/L (ref 3.5–5.1)
Sodium: 142 mmol/L (ref 135–145)

## 2024-02-22 LAB — MAGNESIUM: Magnesium: 1.9 mg/dL (ref 1.7–2.4)

## 2024-02-22 MED ORDER — KCL IN DEXTROSE-NACL 20-5-0.9 MEQ/L-%-% IV SOLN
INTRAVENOUS | Status: DC
  Filled 2024-02-22 (×2): qty 1000

## 2024-02-22 NOTE — Progress Notes (Signed)
 Subjective: Still with central abdominal pain overnight that required medication.  No real flatus or BM still.  NGT working better after flushing started yesterday.  1200cc out, but some mix of ice, but still appears mostly bilious  ROS: See above, otherwise other systems negative  Objective: Vital signs in last 24 hours: Temp:  [97.7 F (36.5 C)-98.9 F (37.2 C)] 98.3 F (36.8 C) (10/12 0743) Pulse Rate:  [63-67] 67 (10/12 0743) Resp:  [16-19] 18 (10/12 0743) BP: (156-171)/(68-71) 164/68 (10/12 0743) SpO2:  [94 %-97 %] 94 % (10/12 0743) Last BM Date : 02/17/24 (per patient)  Intake/Output from previous day: 10/11 0701 - 10/12 0700 In: 1492.7 [I.V.:1242.7; NG/GT:250] Out: 1100 [Emesis/NG output:1100] Intake/Output this shift: No intake/output data recorded.  PE: Gen: NAD Abd: soft, tender centrally in her mid, upper, and lower abdomen.  No guarding or peritonitis.  NGT with thick bilious output, 1200cc documented in last 24 hrs  Lab Results:  Recent Labs    02/21/24 0750 02/22/24 0259  WBC 10.7* 10.4  HGB 13.8 13.5  HCT 43.3 41.9  PLT 195 166   BMET Recent Labs    02/21/24 0750 02/22/24 0259  NA 141 142  K 4.7 3.7  CL 109 108  CO2 23 25  GLUCOSE 186* 181*  BUN 10 12  CREATININE 0.70 0.66  CALCIUM 9.0 9.0   PT/INR No results for input(s): LABPROT, INR in the last 72 hours. CMP     Component Value Date/Time   NA 142 02/22/2024 0259   NA 143 01/08/2018 1605   K 3.7 02/22/2024 0259   CL 108 02/22/2024 0259   CO2 25 02/22/2024 0259   GLUCOSE 181 (H) 02/22/2024 0259   BUN 12 02/22/2024 0259   BUN 12 01/08/2018 1605   CREATININE 0.66 02/22/2024 0259   CALCIUM 9.0 02/22/2024 0259   PROT 6.3 (L) 02/19/2024 1950   ALBUMIN 3.2 (L) 02/22/2024 0259   AST 18 02/19/2024 1950   ALT 14 02/19/2024 1950   ALKPHOS 62 02/19/2024 1950   BILITOT 0.5 02/19/2024 1950   GFRNONAA >60 02/22/2024 0259   GFRAA 107 01/08/2018 1605   Lipase     Component  Value Date/Time   LIPASE 33 02/19/2024 1950       Studies/Results: DG Abd Portable 1V Result Date: 02/21/2024 EXAM: 1 VIEW XRAY OF THE ABDOMEN 02/21/2024 09:30:00 AM COMPARISON: 02/20/2024 CLINICAL HISTORY: SBO (small bowel obstruction) (HCC) [id number]. Reason for exam: SBO. FINDINGS: LINES, TUBES AND DEVICES: Gastric decompression tube in place in the gastric fundus. New enteric contrast material within the gastric fundus. BOWEL: Dilation of small bowel loops within the central abdomen, up to 4.5 cm in diameter compared to 3.3 cm previously, consistent with small bowel obstruction. SOFT TISSUES: Right upper quadrant surgical clips noted. No opaque urinary calculi. BONES: No acute osseous abnormality. IMPRESSION: 1. Small bowel obstruction with progressive dilation of small bowel loops up to 4.5 cm, increased from 3.3 cm previously. 2. Gastric decompression tube terminates in the gastric fundus. Electronically signed by: Waddell Calk MD 02/21/2024 10:05 AM EDT RP Workstation: [id number]   DG Abd Portable 1V-Small Bowel Obstruction Protocol-initial, 8 hr delay Result Date: 02/20/2024 CLINICAL DATA:  8 hour small-bowel follow up EXAM: PORTABLE ABDOMEN - 1 VIEW COMPARISON:  Film from earlier in the same day. FINDINGS: Gastric catheter is again noted in the stomach. Some retained contrast is noted in the gastric fundus. Administered contrast lies within the mildly dilated small bowel loops  centrally. No significant colonic contrast is seen to suggest partial small bowel obstruction. 24 hour follow-up is recommended. IMPRESSION: Contrast is noted within dilated loops of small bowel centrally. No significant colonic contrast is seen. 24 hour follow-up film is recommended. Electronically Signed   By: Oneil Devonshire M.D.   On: 02/20/2024 19:00   DG Abd 1 View Result Date: 02/20/2024 CLINICAL DATA:  Nasogastric tube placement. EXAM: DG ABDOMEN 1V COMPARISON:  Chest, abdomen and pelvis CTA dated 02/20/2024  FINDINGS: Nasogastric tube tip and side hole in the proximal stomach. Normal bowel-gas pattern. Excreted contrast in the renal collecting systems. Lower lumbar spine degenerative changes. Cholecystectomy clips. IMPRESSION: Nasogastric tube tip and side hole in the proximal stomach. Electronically Signed   By: Elspeth Bathe M.D.   On: 02/20/2024 09:45    Anti-infectives: Anti-infectives (From admission, onward)    None        Assessment/Plan  SBO, likely related to intra-abdominal adhesions  -8-hr delay with contrast still in her stomach -repeat films this morning just reviewed and still show the dilated loop of small bowel similar to yesterday -Flush NGT q 4 hrs -she does not have guarding or peritonitis, but concerned with worsening pattern that she may need surgical intervention if she doesn't start resolving -will continue to closely monitor -repeat x-ray in am, if no improvement soon, she may require operative intervention -may have some ice chips  FEN - NPO/NGT/ice chips/IVFs per [facility] VTE - heparin ID - none  Factor 5 Leiden deficiency H/o DVT HLD HTN  I reviewed hospitalist notes, last 24 h vitals and pain scores, last 48 h intake and output, last 24 h labs and trends, and last 24 h imaging results.   LOS: 2 days    Burnard FORBES Banter , [facility] 02/22/2024, 8:42 AM Please see Amion for pager number during day hours 7:00am-4:30pm or 7:00am -11:30am on weekends

## 2024-02-22 NOTE — Progress Notes (Signed)
 PROGRESS NOTE    Kristine [name]  FMW:[id number] DOB: 07-10-1955 DOA: 02/19/2024 PCP: [name] LABOR, MD  Outpatient Specialists:     Brief Narrative:  As per H&P done on presentation: Patient is a 68 year old Caucasian female past medical history significant for factor V Leiden, DVT, gastritis, hyperlipidemia and hypertension.  Past surgical history includes cesarean section, cholecystectomy and oophorectomy, amongst other surgical procedures done in the past.  Patient presents with mid abdominal pain, cramp-like, that started yesterday around 5:30 PM.  Associated with nausea, but no vomiting.  Patient presented to the hospital for further assessment and management.  CT angio of the chest, abdomen and pelvis revealed high-grade mid small bowel obstruction within the right hemipelvis with proximal small bowel dilatation up to 3 cm and decompressed distal bowel and colon.  CT was negative for pulmonary embolism or aortic syndrome.  Lactic acid came back negative.  Hospitalist team was asked to admit patient for further assessment and management.  Surgical team has also been consulted.     ED Course: Vitals on presentation revealed temperature of 98.7, blood pressure 147/86, heart rate of 63 with respiratory rate of 17 and O2 sat of 98%.  CBC revealed hemoglobin of 13.6, WBC of 6.4 and platelet count of 199.  Chemistry reveals sodium of 142, potassium of 4.3, BUN of 15, creatinine of 0.93 with a blood sugar of 157.  UA revealed specific gravity of 1.018.  Hospitalist team has been asked to admit patient.  02/21/2024: Patient seen alongside patient's husband.  Patient has not broken wound.  No bowel movement.  Input from the surgery team is highly appreciated.  Patient had nausea and vomiting earlier today.  Apparently, NG tube may have been clogged.  NG tube none gets flushed every 4 hours.  Patient is being managed expectantly.  Abdominal x-ray done today revealed revealed worsening  SBO.  02/22/2024: Patient seen alongside patient's husband.  Patient has not broken wound.  No bowel movement.  Abdominal x-ray revealed slight improvement.  Surgery team is assisting with patient's care.  Patient is to be managed conservatively  Assessment & Plan:   Principal Problem:   SBO (small bowel obstruction) (HCC)  Small bowel obstruction: - History of prior abdominal surgeries. - Surgical team management consulted. - Patient presented with nausea and abdominal pain. - CT angio chest, abdomen and pelvis revealed small bowel obstruction. - NG tube to low suction.  Above documentation - NPO. - Keep patient adequately hydrated.  Continue IV fluids - Continue to monitor renal function and electrolytes.   Volume depletion: - Continue adequate hydration.   Hypertension: - Continue to optimize.   History of factor V Leiden and prior DVTs: - Patient was on anticoagulation for 1 year at some point. - DVT prophylaxis with subcutaneous heparin.   DVT prophylaxis: Subcutaneous heparin Code Status: Full code Family Communication: Husband by bedside Disposition Plan: Remains inpatient   Consultants:  Surgery team  Procedures:  NG tube placement  Antimicrobials:  None   Subjective: No bowel movement. Worsening SBO. Nausea and vomiting earlier.  Objective: Vitals:   02/22/24 0411 02/22/24 0553 02/22/24 0743 02/22/24 1451  BP: (!) 156/71 (!) 158/68 (!) 164/68 (!) 160/73  Pulse: 63 65 67 77  Resp: 16  18   Temp: 98.2 F (36.8 C)  98.3 F (36.8 C) 99.1 F (37.3 C)  TempSrc: Oral   Oral  SpO2: 97%  94% 96%  Weight:      Height:  Intake/Output Summary (Last 24 hours) at 02/22/2024 1717 Last data filed at 02/22/2024 1223 Gross per 24 hour  Intake 1592.71 ml  Output 1300 ml  Net 292.71 ml   Filed Weights   02/19/24 1931  Weight: 81.6 kg    Examination:  General exam: Appears calm and comfortable  Respiratory system: Clear to auscultation.  Respiratory effort normal. Cardiovascular system: S1 & S2 heard Gastrointestinal system: Abdomen is soft and nontender.  Proactive bowel sounds. Central nervous system: Alert and oriented.   Data Reviewed: I have personally reviewed following labs and imaging studies  CBC: Recent Labs  Lab 02/19/24 1950 02/21/24 0750 02/22/24 0259  WBC 6.4 10.7* 10.4  NEUTROABS  --   --  8.4*  HGB 13.6 13.8 13.5  HCT 42.8 43.3 41.9  MCV 87.5 89.1 87.8  PLT 199 195 166   Basic Metabolic Panel: Recent Labs  Lab 02/19/24 1950 02/21/24 0750 02/22/24 0259  NA 142 141 142  K 4.3 4.7 3.7  CL 102 109 108  CO2 27 23 25   GLUCOSE 157* 186* 181*  BUN 15 10 12   CREATININE 0.93 0.70 0.66  CALCIUM 9.4 9.0 9.0  MG  --   --  1.9  PHOS  --   --  3.2   GFR: Estimated Creatinine Clearance: 70.8 mL/min (by C-G formula based on SCr of 0.66 mg/dL). Liver Function Tests: Recent Labs  Lab 02/19/24 1950 02/22/24 0259  AST 18  --   ALT 14  --   ALKPHOS 62  --   BILITOT 0.5  --   PROT 6.3*  --   ALBUMIN 3.9 3.2*   Recent Labs  Lab 02/19/24 1950  LIPASE 33   No results for input(s): AMMONIA in the last 168 hours. Coagulation Profile: No results for input(s): INR, PROTIME in the last 168 hours. Cardiac Enzymes: No results for input(s): CKTOTAL, CKMB, CKMBINDEX, TROPONINI in the last 168 hours. BNP (last 3 results) No results for input(s): PROBNP in the last 8760 hours. HbA1C: No results for input(s): HGBA1C in the last 72 hours. CBG: Recent Labs  Lab 02/22/24 0002 02/22/24 0402 02/22/24 0915 02/22/24 1155 02/22/24 1608  GLUCAP 138* 185* 140* 162* 143*   Lipid Profile: No results for input(s): CHOL, HDL, LDLCALC, TRIG, CHOLHDL, LDLDIRECT in the last 72 hours. Thyroid  Function Tests: No results for input(s): TSH, T4TOTAL, FREET4, T3FREE, THYROIDAB in the last 72 hours. Anemia Panel: No results for input(s): VITAMINB12, FOLATE, FERRITIN,  TIBC, IRON, RETICCTPCT in the last 72 hours. Urine analysis:    Component Value Date/Time   COLORURINE YELLOW 02/19/2024 1950   APPEARANCEUR CLEAR 02/19/2024 1950   LABSPEC 1.018 02/19/2024 1950   PHURINE 8.0 02/19/2024 1950   GLUCOSEU NEGATIVE 02/19/2024 1950   HGBUR NEGATIVE 02/19/2024 1950   HGBUR negative 11/26/2006 1000   BILIRUBINUR NEGATIVE 02/19/2024 1950   KETONESUR NEGATIVE 02/19/2024 1950   PROTEINUR NEGATIVE 02/19/2024 1950   UROBILINOGEN 0.2 11/26/2006 1000   NITRITE NEGATIVE 02/19/2024 1950   LEUKOCYTESUR NEGATIVE 02/19/2024 1950   Sepsis Labs: @LABRCNTIP (procalcitonin:4,lacticidven:4)  )No results found for this or any previous visit (from the past 240 hours).       Radiology Studies: DG Abd Portable 1V Result Date: 02/22/2024 CLINICAL DATA:  Small-bowel obstruction EXAM: PORTABLE ABDOMEN - 1 VIEW COMPARISON:  Abdominal radiograph dated 02/21/2024 FINDINGS: Gastric/enteric tube tip projects over the stomach. Slightly decreased but persistent gas-filled dilated small bowel loops within the central abdomen. Dilute contrast material is seen within left hemi abdominal  bowel loops. IMPRESSION: Slightly decreased but persistent gas-filled dilated small bowel loops within the central abdomen. Electronically Signed   By: Limin  Xu M.D.   On: 02/22/2024 10:21   DG Abd Portable 1V Result Date: 02/21/2024 EXAM: 1 VIEW XRAY OF THE ABDOMEN 02/21/2024 09:30:00 AM COMPARISON: 02/20/2024 CLINICAL HISTORY: SBO (small bowel obstruction) (HCC) 881154. Reason for exam: SBO. FINDINGS: LINES, TUBES AND DEVICES: Gastric decompression tube in place in the gastric fundus. New enteric contrast material within the gastric fundus. BOWEL: Dilation of small bowel loops within the central abdomen, up to 4.5 cm in diameter compared to 3.3 cm previously, consistent with small bowel obstruction. SOFT TISSUES: Right upper quadrant surgical clips noted. No opaque urinary calculi. BONES: No acute  osseous abnormality. IMPRESSION: 1. Small bowel obstruction with progressive dilation of small bowel loops up to 4.5 cm, increased from 3.3 cm previously. 2. Gastric decompression tube terminates in the gastric fundus. Electronically signed by: Waddell Calk MD 02/21/2024 10:05 AM EDT RP Workstation: [id number]   DG Abd Portable 1V-Small Bowel Obstruction Protocol-initial, 8 hr delay Result Date: 02/20/2024 CLINICAL DATA:  8 hour small-bowel follow up EXAM: PORTABLE ABDOMEN - 1 VIEW COMPARISON:  Film from earlier in the same day. FINDINGS: Gastric catheter is again noted in the stomach. Some retained contrast is noted in the gastric fundus. Administered contrast lies within the mildly dilated small bowel loops centrally. No significant colonic contrast is seen to suggest partial small bowel obstruction. 24 hour follow-up is recommended. IMPRESSION: Contrast is noted within dilated loops of small bowel centrally. No significant colonic contrast is seen. 24 hour follow-up film is recommended. Electronically Signed   By: Oneil Devonshire M.D.   On: 02/20/2024 19:00        Scheduled Meds:  fentaNYL (SUBLIMAZE) injection  25 mcg Intravenous Once   fluticasone   2 spray Each Nare Daily   heparin  5,000 Units Subcutaneous Q8H   insulin aspart  0-9 Units Subcutaneous Q4H   metoprolol  tartrate  5 mg Intravenous Q8H   Continuous Infusions:  dextrose 5 % and 0.9 % NaCl with KCl 20 mEq/L 75 mL/hr at 02/22/24 0008     LOS: 2 days    Time spent: 35 minutes.    Leatrice Chapel, MD  [facility] Pager #: [phone] 7PM-7AM contact night coverage as above

## 2024-02-23 ENCOUNTER — Inpatient Hospital Stay (HOSPITAL_COMMUNITY): Admitting: Anesthesiology

## 2024-02-23 ENCOUNTER — Other Ambulatory Visit: Payer: Self-pay

## 2024-02-23 ENCOUNTER — Encounter (HOSPITAL_COMMUNITY): Payer: Self-pay | Admitting: Internal Medicine

## 2024-02-23 ENCOUNTER — Encounter (HOSPITAL_COMMUNITY)
Admission: EM | Disposition: A | Discharge status: Rehabilitation Facility | Payer: Self-pay | Source: Home / Self Care | Attending: Internal Medicine

## 2024-02-23 ENCOUNTER — Inpatient Hospital Stay (HOSPITAL_COMMUNITY)

## 2024-02-23 DIAGNOSIS — F418 Other specified anxiety disorders: Secondary | ICD-10-CM

## 2024-02-23 DIAGNOSIS — K56609 Unspecified intestinal obstruction, unspecified as to partial versus complete obstruction: Secondary | ICD-10-CM

## 2024-02-23 DIAGNOSIS — I10 Essential (primary) hypertension: Secondary | ICD-10-CM | POA: Diagnosis not present

## 2024-02-23 DIAGNOSIS — E7800 Pure hypercholesterolemia, unspecified: Secondary | ICD-10-CM

## 2024-02-23 HISTORY — PX: LAPAROSCOPY: SHX197

## 2024-02-23 HISTORY — PX: LAPAROTOMY: SHX154

## 2024-02-23 LAB — GLUCOSE, CAPILLARY
Glucose-Capillary: 102 mg/dL — ABNORMAL HIGH (ref 70–99)
Glucose-Capillary: 116 mg/dL — ABNORMAL HIGH (ref 70–99)
Glucose-Capillary: 120 mg/dL — ABNORMAL HIGH (ref 70–99)
Glucose-Capillary: 123 mg/dL — ABNORMAL HIGH (ref 70–99)
Glucose-Capillary: 132 mg/dL — ABNORMAL HIGH (ref 70–99)
Glucose-Capillary: 150 mg/dL — ABNORMAL HIGH (ref 70–99)
Glucose-Capillary: 179 mg/dL — ABNORMAL HIGH (ref 70–99)
Glucose-Capillary: 207 mg/dL — ABNORMAL HIGH (ref 70–99)

## 2024-02-23 LAB — TYPE AND SCREEN
ABO/RH(D): A POS
Antibody Screen: NEGATIVE

## 2024-02-23 LAB — ABO/RH: ABO/RH(D): A POS

## 2024-02-23 SURGERY — LAPAROSCOPY, DIAGNOSTIC
Anesthesia: General | Site: Abdomen

## 2024-02-23 MED ORDER — LIDOCAINE 2% (20 MG/ML) 5 ML SYRINGE
INTRAMUSCULAR | Status: DC | PRN
  Administered 2024-02-23: 60 mg via INTRAVENOUS

## 2024-02-23 MED ORDER — CEFAZOLIN SODIUM-DEXTROSE 1-4 GM/50ML-% IV SOLN
1.0000 g | Freq: Three times a day (TID) | INTRAVENOUS | Status: DC
  Administered 2024-02-23: 2 g via INTRAVENOUS
  Administered 2024-02-23 – 2024-02-25 (×5): 1 g via INTRAVENOUS
  Filled 2024-02-23 (×6): qty 50

## 2024-02-23 MED ORDER — BUPIVACAINE-EPINEPHRINE (PF) 0.25% -1:200000 IJ SOLN
INTRAMUSCULAR | Status: AC
  Filled 2024-02-23: qty 30

## 2024-02-23 MED ORDER — 0.9 % SODIUM CHLORIDE (POUR BTL) OPTIME
TOPICAL | Status: DC | PRN
  Administered 2024-02-23 (×4): 1000 mL

## 2024-02-23 MED ORDER — MIDAZOLAM HCL 2 MG/2ML IJ SOLN
INTRAMUSCULAR | Status: AC
Start: 2024-02-23 — End: 2024-02-23
  Filled 2024-02-23: qty 2

## 2024-02-23 MED ORDER — SODIUM CHLORIDE 0.9% FLUSH: 10.0000 mL | INTRAVENOUS | Status: DC | PRN

## 2024-02-23 MED ORDER — FENTANYL CITRATE (PF) 100 MCG/2ML IJ SOLN
INTRAMUSCULAR | Status: AC
  Filled 2024-02-23: qty 2

## 2024-02-23 MED ORDER — SODIUM CHLORIDE 0.9% FLUSH
10.0000 mL | Freq: Two times a day (BID) | INTRAVENOUS | Status: DC
  Administered 2024-02-23 – 2024-03-07 (×25): 10 mL

## 2024-02-23 MED ORDER — LACTATED RINGERS IV SOLN: INTRAVENOUS | Status: DC

## 2024-02-23 MED ORDER — SUCCINYLCHOLINE CHLORIDE 200 MG/10ML IV SOSY
PREFILLED_SYRINGE | INTRAVENOUS | Status: DC | PRN
  Administered 2024-02-23: 120 mg via INTRAVENOUS

## 2024-02-23 MED ORDER — SUGAMMADEX SODIUM 200 MG/2ML IV SOLN
INTRAVENOUS | Status: DC | PRN
  Administered 2024-02-23: 200 mg via INTRAVENOUS

## 2024-02-23 MED ORDER — HYDROMORPHONE HCL 1 MG/ML IJ SOLN
INTRAMUSCULAR | Status: AC
  Filled 2024-02-23: qty 1

## 2024-02-23 MED ORDER — PROPOFOL 10 MG/ML IV BOLUS
INTRAVENOUS | Status: AC
  Filled 2024-02-23: qty 20

## 2024-02-23 MED ORDER — ONDANSETRON HCL 4 MG/2ML IJ SOLN: 4.0000 mg | Freq: Once | INTRAMUSCULAR | Status: DC | PRN

## 2024-02-23 MED ORDER — ROCURONIUM BROMIDE 10 MG/ML (PF) SYRINGE
PREFILLED_SYRINGE | INTRAVENOUS | Status: DC | PRN
  Administered 2024-02-23: 50 mg via INTRAVENOUS

## 2024-02-23 MED ORDER — ONDANSETRON HCL 4 MG/2ML IJ SOLN
INTRAMUSCULAR | Status: DC | PRN
  Administered 2024-02-23: 4 mg via INTRAVENOUS

## 2024-02-23 MED ORDER — FENTANYL CITRATE (PF) 250 MCG/5ML IJ SOLN
INTRAMUSCULAR | Status: DC | PRN
Start: 2024-02-23 — End: 2024-02-23
  Administered 2024-02-23 (×2): 100 ug via INTRAVENOUS
  Administered 2024-02-23: 50 ug via INTRAVENOUS

## 2024-02-23 MED ORDER — FENTANYL CITRATE (PF) 250 MCG/5ML IJ SOLN
INTRAMUSCULAR | Status: AC
  Filled 2024-02-23: qty 5

## 2024-02-23 MED ORDER — BUPIVACAINE-EPINEPHRINE 0.25% -1:200000 IJ SOLN
INTRAMUSCULAR | Status: DC | PRN
  Administered 2024-02-23: 20 mL

## 2024-02-23 MED ORDER — PROPOFOL 10 MG/ML IV BOLUS
INTRAVENOUS | Status: DC | PRN
  Administered 2024-02-23: 150 mg via INTRAVENOUS
  Administered 2024-02-23: 150 ug/kg/min via INTRAVENOUS

## 2024-02-23 MED ORDER — ORAL CARE MOUTH RINSE: 15.0000 mL | Freq: Once | OROMUCOSAL | Status: AC

## 2024-02-23 MED ORDER — CHLORHEXIDINE GLUCONATE 0.12 % MT SOLN
15.0000 mL | Freq: Once | OROMUCOSAL | Status: AC
  Administered 2024-02-23: 15 mL via OROMUCOSAL

## 2024-02-23 MED ORDER — HYDROMORPHONE HCL 1 MG/ML IJ SOLN
0.2500 mg | INTRAMUSCULAR | Status: DC | PRN
  Administered 2024-02-23 (×4): 0.5 mg via INTRAVENOUS

## 2024-02-23 MED ORDER — FENTANYL CITRATE (PF) 100 MCG/2ML IJ SOLN
25.0000 ug | INTRAMUSCULAR | Status: DC | PRN
  Administered 2024-02-23 (×3): 50 ug via INTRAVENOUS

## 2024-02-23 MED ORDER — DEXAMETHASONE SOD PHOSPHATE PF 10 MG/ML IJ SOLN
INTRAMUSCULAR | Status: DC | PRN
  Administered 2024-02-23: 10 mg via INTRAVENOUS

## 2024-02-23 SURGICAL SUPPLY — 32 items
CANISTER SUCTION 3000ML PPV (SUCTIONS) IMPLANT
CHLORAPREP W/TINT 26 (MISCELLANEOUS) ×2 IMPLANT
CLIP APPLIE 5 13 M/L LIGAMAX5 (MISCELLANEOUS) IMPLANT
COVER SURGICAL LIGHT HANDLE (MISCELLANEOUS) ×2 IMPLANT
DERMABOND ADVANCED .7 DNX12 (GAUZE/BANDAGES/DRESSINGS) ×2 IMPLANT
DRAPE C-ARM 42X72 X-RAY (DRAPES) IMPLANT
DRAPE LAPAROSCOPIC ABDOMINAL (DRAPES) ×2 IMPLANT
DRSG OPSITE POSTOP 4X8 (GAUZE/BANDAGES/DRESSINGS) IMPLANT
ELECTRODE REM PT RTRN 9FT ADLT (ELECTROSURGICAL) ×2 IMPLANT
GAUZE 4X4 16PLY ~~LOC~~+RFID DBL (SPONGE) IMPLANT
GLOVE SURG SIGNA 7.5 PF LTX (GLOVE) ×2 IMPLANT
GOWN STRL REUS W/ TWL LRG LVL3 (GOWN DISPOSABLE) ×4 IMPLANT
GOWN STRL REUS W/ TWL XL LVL3 (GOWN DISPOSABLE) ×2 IMPLANT
IRRIGATION SUCT STRKRFLW 2 WTP (MISCELLANEOUS) IMPLANT
KIT BASIN OR (CUSTOM PROCEDURE TRAY) ×2 IMPLANT
KIT TURNOVER KIT B (KITS) ×2 IMPLANT
PAD ARMBOARD POSITIONER FOAM (MISCELLANEOUS) ×2 IMPLANT
SCISSORS LAP 5X35 DISP (ENDOMECHANICALS) IMPLANT
SET TUBE SMOKE EVAC HIGH FLOW (TUBING) ×2 IMPLANT
SLEEVE Z-THREAD 5X100MM (TROCAR) ×2 IMPLANT
SOLN 0.9% NACL 1000 ML (IV SOLUTION) ×2 IMPLANT
SOLN 0.9% NACL POUR BTL 1000ML (IV SOLUTION) ×2 IMPLANT
STAPLER SKIN PROX 35W (STAPLE) IMPLANT
SUT MON AB 4-0 PC3 18 (SUTURE) ×2 IMPLANT
SUT PDS AB 1 TP1 96 (SUTURE) IMPLANT
SUT SILK 2 0 TIES 10X30 (SUTURE) IMPLANT
SUT SILK 3 0 SH CR/8 (SUTURE) IMPLANT
TOWEL GREEN STERILE FF (TOWEL DISPOSABLE) ×2 IMPLANT
TRAY LAPAROSCOPIC MC (CUSTOM PROCEDURE TRAY) ×2 IMPLANT
TROCAR 11X100 Z THREAD (TROCAR) IMPLANT
TROCAR Z-THREAD OPTICAL 5X100M (TROCAR) ×2 IMPLANT
WARMER LAPAROSCOPE (MISCELLANEOUS) ×2 IMPLANT

## 2024-02-23 NOTE — Progress Notes (Signed)
 Progress Note     Subjective: Patient reports pressure of the epigastric region and central lower abdomen. Denies BM. She states that she did have some air two days ago, but does not feel that it was her normal episode of flatulence. Reports some nausea overnight. Denies vomiting.  ROS  Per HPI  Objective: Vital signs in last 24 hours: Temp:  [97.9 F (36.6 C)-99.1 F (37.3 C)] 97.9 F (36.6 C) (10/13 0749) Pulse Rate:  [68-77] 68 (10/13 0749) Resp:  [16-18] 16 (10/13 0749) BP: (160-164)/(67-76) 164/70 (10/13 0749) SpO2:  [95 %-98 %] 95 % (10/13 0749) Last BM Date :  (PTA)  Intake/Output from previous day: 10/12 0701 - 10/13 0700 In: 325 [NG/GT:325] Out: 1950 [Emesis/NG output:1950] Intake/Output this shift: No intake/output data recorded.  PE: General: Pleasant female who is laying in bed in NAD. HEENT: Head is normocephalic, atraumatic. NGT present with thick bilious output in canister.   Heart: HR normal. Lungs: Respiratory effort nonlabored Abd: Soft, ND. Tenderness to palpation of epigastric region and lower central abdomen. No bowel sound appreciated on exam. Ecchymosis noted most likely from heparin injections. No rebound tenderness or guarding.  Skin: Warm and dry. Psych: A&Ox3 with an appropriate affect.    Lab Results:  Recent Labs    02/21/24 0750 02/22/24 0259  WBC 10.7* 10.4  HGB 13.8 13.5  HCT 43.3 41.9  PLT 195 166   BMET Recent Labs    02/21/24 0750 02/22/24 0259  NA 141 142  K 4.7 3.7  CL 109 108  CO2 23 25  GLUCOSE 186* 181*  BUN 10 12  CREATININE 0.70 0.66  CALCIUM 9.0 9.0   PT/INR No results for input(s): LABPROT, INR in the last 72 hours. CMP     Component Value Date/Time   NA 142 02/22/2024 0259   NA 143 01/08/2018 1605   K 3.7 02/22/2024 0259   CL 108 02/22/2024 0259   CO2 25 02/22/2024 0259   GLUCOSE 181 (H) 02/22/2024 0259   BUN 12 02/22/2024 0259   BUN 12 01/08/2018 1605   CREATININE 0.66 02/22/2024 0259    CALCIUM 9.0 02/22/2024 0259   PROT 6.3 (L) 02/19/2024 1950   ALBUMIN 3.2 (L) 02/22/2024 0259   AST 18 02/19/2024 1950   ALT 14 02/19/2024 1950   ALKPHOS 62 02/19/2024 1950   BILITOT 0.5 02/19/2024 1950   GFRNONAA >60 02/22/2024 0259   GFRAA 107 01/08/2018 1605   Lipase     Component Value Date/Time   LIPASE 33 02/19/2024 1950       Studies/Results: DG Abd Portable 1V Result Date: 02/22/2024 CLINICAL DATA:  Small-bowel obstruction EXAM: PORTABLE ABDOMEN - 1 VIEW COMPARISON:  Abdominal radiograph dated 02/21/2024 FINDINGS: Gastric/enteric tube tip projects over the stomach. Slightly decreased but persistent gas-filled dilated small bowel loops within the central abdomen. Dilute contrast material is seen within left hemi abdominal bowel loops. IMPRESSION: Slightly decreased but persistent gas-filled dilated small bowel loops within the central abdomen. Electronically Signed   By: Limin  Xu M.D.   On: 02/22/2024 10:21   DG Abd Portable 1V Result Date: 02/21/2024 EXAM: 1 VIEW XRAY OF THE ABDOMEN 02/21/2024 09:30:00 AM COMPARISON: 02/20/2024 CLINICAL HISTORY: SBO (small bowel obstruction) (HCC) [id number]. Reason for exam: SBO. FINDINGS: LINES, TUBES AND DEVICES: Gastric decompression tube in place in the gastric fundus. New enteric contrast material within the gastric fundus. BOWEL: Dilation of small bowel loops within the central abdomen, up to 4.5 cm in diameter compared to  3.3 cm previously, consistent with small bowel obstruction. SOFT TISSUES: Right upper quadrant surgical clips noted. No opaque urinary calculi. BONES: No acute osseous abnormality. IMPRESSION: 1. Small bowel obstruction with progressive dilation of small bowel loops up to 4.5 cm, increased from 3.3 cm previously. 2. Gastric decompression tube terminates in the gastric fundus. Electronically signed by: Waddell Calk MD 02/21/2024 10:05 AM EDT RP Workstation: [id number]    Anti-infectives: Anti-infectives (From admission,  onward)    None        Assessment/Plan SBO, likely related to intra-abdominal adhesions  -8-hr delay with contrast still in her stomach -Repeat films 10/12 still showed the dilated loop of small bowel. -Repeat film from today 10/13 is pending final report but shows dilated loops of small bowel. -Continue to flush NGT q 4 hrs -She continues to not have guarding or peritonitis, but is tender on abdominal exam. Reviewed that she may need surgical intervention if she doesn't start resolving. No emergent surgical intervention indicated at this time.  -Will continue to monitor closely    FEN - NPO/NGT/ice chips/IVFs per [facility] VTE - Heparin ID - None   Factor 5 Leiden deficiency H/o DVT HLD HTN    LOS: 3 days   I reviewed nursing notes, last 24 h vitals and pain scores, last 48 h intake and output, last 24 h labs and trends, and last 24 h imaging results.  This care required moderate level of medical decision making.    Marjorie Carlyon Favre, [facility] 02/23/2024, 8:48 AM Please see Amion for pager number during day hours 7:00am-4:30pm

## 2024-02-23 NOTE — Anesthesia Postprocedure Evaluation (Signed)
 Anesthesia Post Note  Patient: Kristine [name]  Procedure(s) Performed: LAPAROSCOPY, DIAGNOSTIC LAPAROTOMY, EXPLORATORY (Abdomen)     Patient location during evaluation: PACU Anesthesia Type: General Level of consciousness: awake and alert Pain management: pain level controlled Vital Signs Assessment: post-procedure vital signs reviewed and stable Respiratory status: spontaneous breathing, nonlabored ventilation, respiratory function stable and patient connected to nasal cannula oxygen Cardiovascular status: blood pressure returned to baseline and stable Postop Assessment: no apparent nausea or vomiting Anesthetic complications: no   No notable events documented.  Last Vitals:  Vitals:   02/23/24 2100 02/23/24 2128  BP: 126/71 121/70  Pulse: (!) 115 (!) 121  Resp: 13   Temp: 37.4 C (!) 36.3 C  SpO2: 94% 96%    Last Pain:  Vitals:   02/23/24 2207  TempSrc:   PainSc: 8                  Epifanio Lamar BRAVO

## 2024-02-23 NOTE — Transfer of Care (Signed)
 Immediate Anesthesia Transfer of Care Note  Patient: Kristine [name]  Procedure(s) Performed: LAPAROSCOPY, DIAGNOSTIC LAPAROTOMY, EXPLORATORY (Abdomen)  Patient Location: PACU  Anesthesia Type:General  Level of Consciousness: awake, alert , oriented, and patient cooperative  Airway & Oxygen Therapy: Patient Spontanous Breathing and Patient connected to face mask oxygen  Post-op Assessment: Report given to RN, Post -op Vital signs reviewed and stable, Patient moving all extremities, and Patient moving all extremities X 4  Post vital signs: Reviewed and stable  Last Vitals:  Vitals Value Taken Time  BP 166/79 02/23/24 19:21  Temp    Pulse 83 02/23/24 19:25  Resp 17 02/23/24 19:25  SpO2 95 % 02/23/24 19:25  Vitals shown include unfiled device data.  Last Pain:  Vitals:   02/23/24 1349  TempSrc:   PainSc: 0-No pain         Complications: No notable events documented.

## 2024-02-23 NOTE — Progress Notes (Signed)
 PROGRESS NOTE    DARIANN HUCKABA  FMW:[id number] DOB: 11/04/1955 DOA: 02/19/2024 PCP: [name] LABOR, MD  Outpatient Specialists:     Brief Narrative:  Patient is a 68 year old Caucasian female past medical history significant for factor V Leiden, DVT, gastritis, hyperlipidemia and hypertension.  Past surgical history includes cesarean section, cholecystectomy and oophorectomy, amongst other surgical procedures done in the past.  Patient presents with mid abdominal pain, cramp-like, that started yesterday around 5:30 PM.  Associated with nausea, but no vomiting.  Patient presented to the hospital for further assessment and management.  CT angio of the chest, abdomen and pelvis revealed high-grade mid small bowel obstruction within the right hemipelvis with proximal small bowel dilatation up to 3 cm and decompressed distal bowel and colon.  CT was negative for pulmonary embolism or aortic syndrome.  Lactic acid came back negative.  Surgical team has been assisting with patient's management.  02/23/2024: Patient was initially managed supportively.  NG to low suction.  No significant improvement since admission.  Surgery treatment plans for possible surgery if patient does not start to improve.    Assessment & Plan:   Principal Problem:   SBO (small bowel obstruction) (HCC)  Small bowel obstruction: - History of prior abdominal surgeries. - Surgical team management consulted. - Patient presented with nausea and abdominal pain. - CT angio chest, abdomen and pelvis revealed small bowel obstruction. - NG tube to low suction.  Above documentation - NPO. - Keep patient adequately hydrated.  Continue IV fluids - Continue to monitor renal function and electrolytes. 02/23/2024: Surgery team to advise timing of possible surgery i.e. if indicated.   Volume depletion: - Continue adequate hydration.   Hypertension: - Continue to optimize.   History of factor V Leiden and prior DVTs: -  Patient was on anticoagulation for 1 year at some point. - DVT prophylaxis with subcutaneous heparin.   DVT prophylaxis: Subcutaneous heparin Code Status: Full code Family Communication: Husband by bedside Disposition Plan: Remains inpatient   Consultants:  Surgery team  Procedures:  NG tube placement  Antimicrobials:  None   Subjective: No bowel movement. Worsening SBO. Nausea and vomiting earlier.  Objective: Vitals:   02/23/24 2015 02/23/24 2030 02/23/24 2045 02/23/24 2100  BP: 120/71 123/63 126/64 126/71  Pulse: 96 98 (!) 109 (!) 115  Resp: (!) 24 16 (!) 21 13  Temp:      TempSrc:      SpO2: 97% 96% 95% 94%  Weight:      Height:        Intake/Output Summary (Last 24 hours) at 02/23/2024 2103 Last data filed at 02/23/2024 2015 Gross per 24 hour  Intake 2209.33 ml  Output 2150 ml  Net 59.33 ml   Filed Weights   02/19/24 1931 02/23/24 1337  Weight: 81.6 kg 81.6 kg    Examination:  General exam: Appears calm and comfortable  Respiratory system: Clear to auscultation. Respiratory effort normal. Cardiovascular system: S1 & S2 heard Gastrointestinal system: Abdomen is soft and nontender.  Proactive bowel sounds. Central nervous system: Alert and oriented.   Data Reviewed: I have personally reviewed following labs and imaging studies  CBC: Recent Labs  Lab 02/19/24 1950 02/21/24 0750 02/22/24 0259  WBC 6.4 10.7* 10.4  NEUTROABS  --   --  8.4*  HGB 13.6 13.8 13.5  HCT 42.8 43.3 41.9  MCV 87.5 89.1 87.8  PLT 199 195 166   Basic Metabolic Panel: Recent Labs  Lab 02/19/24 1950 02/21/24 0750 02/22/24  0259  NA 142 141 142  K 4.3 4.7 3.7  CL 102 109 108  CO2 27 23 25   GLUCOSE 157* 186* 181*  BUN 15 10 12   CREATININE 0.93 0.70 0.66  CALCIUM 9.4 9.0 9.0  MG  --   --  1.9  PHOS  --   --  3.2   GFR: Estimated Creatinine Clearance: 70.8 mL/min (by C-G formula based on SCr of 0.66 mg/dL). Liver Function Tests: Recent Labs  Lab  02/19/24 1950 02/22/24 0259  AST 18  --   ALT 14  --   ALKPHOS 62  --   BILITOT 0.5  --   PROT 6.3*  --   ALBUMIN 3.9 3.2*   Recent Labs  Lab 02/19/24 1950  LIPASE 33   No results for input(s): AMMONIA in the last 168 hours. Coagulation Profile: No results for input(s): INR, PROTIME in the last 168 hours. Cardiac Enzymes: No results for input(s): CKTOTAL, CKMB, CKMBINDEX, TROPONINI in the last 168 hours. BNP (last 3 results) No results for input(s): PROBNP in the last 8760 hours. HbA1C: No results for input(s): HGBA1C in the last 72 hours. CBG: Recent Labs  Lab 02/23/24 0750 02/23/24 1150 02/23/24 1343 02/23/24 1541 02/23/24 1738  GLUCAP 132* 123* 120* 102* 116*   Lipid Profile: No results for input(s): CHOL, HDL, LDLCALC, TRIG, CHOLHDL, LDLDIRECT in the last 72 hours. Thyroid  Function Tests: No results for input(s): TSH, T4TOTAL, FREET4, T3FREE, THYROIDAB in the last 72 hours. Anemia Panel: No results for input(s): VITAMINB12, FOLATE, FERRITIN, TIBC, IRON, RETICCTPCT in the last 72 hours. Urine analysis:    Component Value Date/Time   COLORURINE YELLOW 02/19/2024 1950   APPEARANCEUR CLEAR 02/19/2024 1950   LABSPEC 1.018 02/19/2024 1950   PHURINE 8.0 02/19/2024 1950   GLUCOSEU NEGATIVE 02/19/2024 1950   HGBUR NEGATIVE 02/19/2024 1950   HGBUR negative 11/26/2006 1000   BILIRUBINUR NEGATIVE 02/19/2024 1950   KETONESUR NEGATIVE 02/19/2024 1950   PROTEINUR NEGATIVE 02/19/2024 1950   UROBILINOGEN 0.2 11/26/2006 1000   NITRITE NEGATIVE 02/19/2024 1950   LEUKOCYTESUR NEGATIVE 02/19/2024 1950   Sepsis Labs: @LABRCNTIP (procalcitonin:4,lacticidven:4)  )No results found for this or any previous visit (from the past 240 hours).       Radiology Studies: DG Abd Portable 1V Result Date: 02/23/2024 CLINICAL DATA:  Small bowel obstruction EXAM: PORTABLE ABDOMEN - 1 VIEW COMPARISON:  Prior chest x-ray yesterday  02/22/2024 FINDINGS: Persistent gaseous distension of multiple loops of small bowel in the left mid abdomen with a maximal diameter of 4.4 cm. Findings are centrally unchanged. Gastric tube remains in good position with the tip overlying the fundus. No large free air. Multilevel degenerative disc disease. IMPRESSION: Persistent and unchanged small bowel obstruction. Maximal small bowel diameter of 4.4 cm. Gastric tube overlies the stomach. Electronically Signed   By: Wilkie Lent M.D.   On: 02/23/2024 09:07   DG Abd Portable 1V Result Date: 02/22/2024 CLINICAL DATA:  Small-bowel obstruction EXAM: PORTABLE ABDOMEN - 1 VIEW COMPARISON:  Abdominal radiograph dated 02/21/2024 FINDINGS: Gastric/enteric tube tip projects over the stomach. Slightly decreased but persistent gas-filled dilated small bowel loops within the central abdomen. Dilute contrast material is seen within left hemi abdominal bowel loops. IMPRESSION: Slightly decreased but persistent gas-filled dilated small bowel loops within the central abdomen. Electronically Signed   By: Limin  Xu M.D.   On: 02/22/2024 10:21        Scheduled Meds:  [MAR Hold] fentaNYL (SUBLIMAZE) injection  25 mcg Intravenous Once   [  MAR Hold] fluticasone   2 spray Each Nare Daily   [MAR Hold] heparin  5,000 Units Subcutaneous Q8H   [MAR Hold] insulin aspart  0-9 Units Subcutaneous Q4H   [MAR Hold] metoprolol  tartrate  5 mg Intravenous Q8H   [MAR Hold] sodium chloride  flush  10-40 mL Intracatheter Q12H   Continuous Infusions:  [MAR Hold]  ceFAZolin (ANCEF) IV     dextrose 5 % and 0.9 % NaCl with KCl 20 mEq/L Stopped (02/23/24 1231)   lactated ringers Stopped (02/23/24 1925)     LOS: 3 days    Time spent: 35 minutes.    Leatrice Chapel, MD  [facility] Pager #: [phone] 7PM-7AM contact night coverage as above

## 2024-02-23 NOTE — Care Management Important Message (Signed)
 Important Message  Patient Details  Name: Kristine [name] MRN: [id number] Date of Birth: March 30, 1956   Important Message Given:  Yes - Medicare IM     Jon Cruel 02/23/2024, 4:44 PM

## 2024-02-23 NOTE — Anesthesia Procedure Notes (Signed)
 Procedure Name: Intubation Date/Time: 02/23/2024 6:17 PM  Performed by: Hedy Jarred, CRNAPre-anesthesia Checklist: Patient identified, Emergency Drugs available, Suction available and Patient being monitored Patient Re-evaluated:Patient Re-evaluated prior to induction Oxygen Delivery Method: Circle System Utilized Preoxygenation: Pre-oxygenation with 100% oxygen Induction Type: IV induction, Rapid sequence and Cricoid Pressure applied Laryngoscope Size: Mac and 3 Grade View: Grade I Tube type: Oral Tube size: 7.0 mm Number of attempts: 1 Airway Equipment and Method: Stylet and Oral airway Placement Confirmation: ETT inserted through vocal cords under direct vision, positive ETCO2 and breath sounds checked- equal and bilateral Secured at: 21 cm Tube secured with: Tape Dental Injury: Teeth and Oropharynx as per pre-operative assessment

## 2024-02-23 NOTE — Op Note (Signed)
 Zaylei AVAEH EWER 02/23/2024   Pre-op Diagnosis: SMALL BOWEL OBSTRUCTION     Post-op Diagnosis: SAME  Procedure(s): Diagnostic laparoscopy Exploratory laparotomy Repair of small bowel enterotomy x 2 Mall bowel stricturoplasty  Surgeon(s): Vernetta Berg, MD Ann Fine, MD  Anesthesia: General  Staff:  Circulator: Primus Leita HERO, RN Scrub Person: Gwenn Mardy SQUIBB, RN Circulator Assistant: Vonzell Clive SQUIBB, RN; Tharon Selinda GAILS, RN  Estimated Blood Loss: Minimal               Indications: This is a 68 year old female who presents with small bowel obstruction.  It is not resolved despite several days of conservative management.  The decision was made to proceed to the operating room for surgery.  Findings: Upon entering the abdomen with Optiview camera trocar, an inadvertent small bowel enterotomy was made and the extensively dilated small bowel.  I made the decision to convert to an open procedure to repair the enterotomies.  I then identified the small bowel obstruction which was from a single adhesive band in the pelvis.  This had created significant stricturing of the small bowel so I elected to perform the small bowel stricturoplasty  Procedure: The patient was brought to the operating room and identified the correct patient.  She was placed upon the operating table and general anesthesia was induced.  Her abdomen was prepped and draped in usual sterile fashion.  I made a small incision in the patient's left upper quadrant with a scalpel and then using the 5 mm trocar and Optiview camera slowly traversed all levels of the abdominal wall.  Insufflation of the abdomen was then begun.  I inspected the site of entry and identified that I created an enterotomy and the massively dilated small bowel.  At this point I made the decision to convert to an open procedure.  I remove the trocar and deflated the abdomen.  I then created a small midline incision with a scalpel.  I took  this down through the subcutaneous tissue to the fascia which I then opened the entire length of the incision.  I then eviscerated the proximal small bowel and identified the enterotomy.  The patient had extremely thickened contents in the small bowel.  I suctioned this out through the enterotomy and then repaired the enterotomy and a smaller backwall enterotomy with interrupted silk sutures.  I then began running the small bowel toward the pelvis.  The patient a thick adhesive band in the pelvis which I was able to free up bluntly and then identified the cause of the obstruction.  The adhesion had created a significant stricture in the distal small bowel.  I elected to perform the small bowel stricturoplasty on this area to relieve the obstruction.  I performed this across the stricture with the cautery and then closed the stricturoplasty transversely with interrupted 3-0 silk sutures.  We again ran the small bowel and the colon and saw no overt evidence of injury or obstruction.  We then copious irrigated the abdomen with several liters of normal saline.  I then closed the patient's midline fascia with running #1 looped PDS suture.  The skin was then irrigated and closed skin staples.  The patient tolerated the procedure well.  All the counts were correct at the end of the procedure.  The patient was then extubated in the operating room and taken in stable condition to the recovery room.          Berg Vernetta   Date: 02/23/2024  Time:  7:13 PM

## 2024-02-23 NOTE — Progress Notes (Signed)
   02/23/24 0839  TOC Brief Assessment  Insurance and Status Reviewed  Patient has primary care physician Yes  Home environment has been reviewed home with husband  Prior level of function: independent  Prior/Current Home Services No current home services  Social Drivers of Health Review SDOH reviewed no interventions necessary  Readmission risk has been reviewed Yes  Transition of care needs no transition of care needs at this time

## 2024-02-23 NOTE — Progress Notes (Signed)
 A midline has been ordered for this patient. The [facility] RN has reviewed the patient's medical record including any arm restrictions, current creatinine clearance, length IV therapy is needed, and infusions needed/ordered to determine if a midline is the appropriate line for this individual patient. If there are contraindications, the physician and primary RN have been contacted by [facility] RN for further discussion. Midline Education: a midline is a long peripheral IV placed in the upper arm with the tip located at or near the axilla and distal to the shoulder should not be used as a CLABSI preventative measure   it has one lumen only it can remain in place for up to 29 days  Safe for Vancomycin infusion LESS THAN 6 days Is safe for power injection if good blood return can be obtained and line flushes easily it CANNOT be used for continuous infusion of vesicants. Including TPN and chemotherapy Should NOT be placed for sole intent of obtaining labs as there is no guarantee blood can be successfully drawn from line  contraindicated in patients with thrombosis, hypercoagulability, decreased venous flow to the extremities, ESRD (without a nephrologist's approval), small vessels, allergy to polyurethane, or known/suspected presence of a device-related infection, bacteremia, or septicemia

## 2024-02-23 NOTE — Anesthesia Preprocedure Evaluation (Addendum)
 Anesthesia Evaluation  Patient identified by MRN, date of birth, ID band Patient awake    Reviewed: Allergy & Precautions, NPO status , Patient's Chart, lab work & pertinent test results  History of Anesthesia Complications (+) PONV, PROLONGED EMERGENCE and history of anesthetic complications  Airway Mallampati: II  TM Distance: >3 FB Neck ROM: Full    Dental  (+) Dental Advisory Given   Pulmonary neg pulmonary ROS   Pulmonary exam normal        Cardiovascular hypertension, Pt. on medications and Pt. on home beta blockers + DVT  Normal cardiovascular exam     Neuro/Psych  Headaches PSYCHIATRIC DISORDERS Anxiety Depression     Neuromuscular disease    GI/Hepatic Neg liver ROS,GERD  Medicated and Controlled,, SBO    Endo/Other   Pre-DM   Renal/GU negative Renal ROS     Musculoskeletal  (+) Arthritis ,    Abdominal   Peds  Hematology  Factor V Leiden mutation    Anesthesia Other Findings   Reproductive/Obstetrics                              Anesthesia Physical Anesthesia Plan  ASA: 3 and emergent  Anesthesia Plan: General   Post-op Pain Management: Ofirmev IV (intra-op)*   Induction: Intravenous and Rapid sequence  PONV Risk Score and Plan: 3 and Treatment may vary due to age or medical condition, Ondansetron and TIVA  Airway Management Planned: Oral ETT  Additional Equipment: None  Intra-op Plan:   Post-operative Plan: Extubation in OR  Informed Consent: I have reviewed the patients History and Physical, chart, labs and discussed the procedure including the risks, benefits and alternatives for the proposed anesthesia with the patient or authorized representative who has indicated his/her understanding and acceptance.     Dental advisory given  Plan Discussed with: CRNA and Anesthesiologist  Anesthesia Plan Comments:          Anesthesia Quick  Evaluation

## 2024-02-24 ENCOUNTER — Encounter (HOSPITAL_COMMUNITY): Payer: Self-pay | Admitting: Surgery

## 2024-02-24 DIAGNOSIS — K56609 Unspecified intestinal obstruction, unspecified as to partial versus complete obstruction: Secondary | ICD-10-CM | POA: Diagnosis not present

## 2024-02-24 LAB — BASIC METABOLIC PANEL WITH GFR
Anion gap: 13 (ref 5–15)
BUN: 20 mg/dL (ref 8–23)
CO2: 21 mmol/L — ABNORMAL LOW (ref 22–32)
Calcium: 8 mg/dL — ABNORMAL LOW (ref 8.9–10.3)
Chloride: 112 mmol/L — ABNORMAL HIGH (ref 98–111)
Creatinine, Ser: 1.22 mg/dL — ABNORMAL HIGH (ref 0.44–1.00)
GFR, Estimated: 48 mL/min — ABNORMAL LOW (ref >60)
Glucose, Bld: 342 mg/dL — ABNORMAL HIGH (ref 70–99)
Potassium: 4.3 mmol/L (ref 3.5–5.1)
Sodium: 146 mmol/L — ABNORMAL HIGH (ref 135–145)

## 2024-02-24 LAB — CBC
HCT: 46.1 % — ABNORMAL HIGH (ref 36.0–46.0)
Hemoglobin: 14.8 g/dL (ref 12.0–15.0)
MCH: 28.7 pg (ref 26.0–34.0)
MCHC: 32.1 g/dL (ref 30.0–36.0)
MCV: 89.3 fL (ref 80.0–100.0)
Platelets: 177 K/uL (ref 150–400)
RBC: 5.16 MIL/uL — ABNORMAL HIGH (ref 3.87–5.11)
RDW: 13.4 % (ref 11.5–15.5)
WBC: 6.3 K/uL (ref 4.0–10.5)
nRBC: 0 % (ref 0.0–0.2)

## 2024-02-24 LAB — GLUCOSE, CAPILLARY
Glucose-Capillary: 185 mg/dL — ABNORMAL HIGH (ref 70–99)
Glucose-Capillary: 174 mg/dL — ABNORMAL HIGH (ref 70–99)
Glucose-Capillary: 129 mg/dL — ABNORMAL HIGH (ref 70–99)
Glucose-Capillary: 126 mg/dL — ABNORMAL HIGH (ref 70–99)
Glucose-Capillary: 106 mg/dL — ABNORMAL HIGH (ref 70–99)

## 2024-02-24 MED ORDER — NALOXONE HCL 0.4 MG/ML IJ SOLN
0.4000 mg | INTRAMUSCULAR | Status: DC | PRN
Start: 2024-02-24 — End: 2024-02-26

## 2024-02-24 MED ORDER — SODIUM CHLORIDE 0.9 % IV BOLUS
1000.0000 mL | Freq: Once | INTRAVENOUS | Status: AC
  Administered 2024-02-24: 1000 mL via INTRAVENOUS

## 2024-02-24 MED ORDER — ACETAMINOPHEN 10 MG/ML IV SOLN
1000.0000 mg | Freq: Four times a day (QID) | INTRAVENOUS | Status: AC
  Administered 2024-02-24 (×3): 1000 mg via INTRAVENOUS
  Filled 2024-02-24 (×3): qty 100

## 2024-02-24 MED ORDER — SODIUM CHLORIDE 0.9 % IV SOLN: INTRAVENOUS | Status: DC

## 2024-02-24 MED ORDER — ONDANSETRON HCL 4 MG/2ML IJ SOLN
4.0000 mg | Freq: Four times a day (QID) | INTRAMUSCULAR | Status: DC | PRN
  Administered 2024-02-24: 4 mg via INTRAVENOUS
  Filled 2024-02-24: qty 2

## 2024-02-24 MED ORDER — CHLORHEXIDINE GLUCONATE CLOTH 2 % EX PADS
6.0000 | MEDICATED_PAD | Freq: Every day | CUTANEOUS | Status: DC
  Administered 2024-02-24 – 2024-03-07 (×13): 6 via TOPICAL

## 2024-02-24 MED ORDER — MORPHINE SULFATE 1 MG/ML IV SOLN PCA
INTRAVENOUS | Status: DC
  Administered 2024-02-24: 6 mg via INTRAVENOUS
  Administered 2024-02-24 (×2): 1.5 mg via INTRAVENOUS
  Administered 2024-02-24: 3 mg via INTRAVENOUS
  Administered 2024-02-25: 1.5 mg via INTRAVENOUS
  Administered 2024-02-25: 3 mg via INTRAVENOUS
  Administered 2024-02-25 – 2024-02-26 (×4): 1.5 mg via INTRAVENOUS
  Filled 2024-02-24: qty 30

## 2024-02-24 MED ORDER — DIPHENHYDRAMINE HCL 12.5 MG/5ML PO ELIX
12.5000 mg | ORAL_SOLUTION | Freq: Four times a day (QID) | ORAL | Status: DC | PRN
Start: 2024-02-24 — End: 2024-02-26

## 2024-02-24 MED ORDER — FENTANYL CITRATE (PF) 50 MCG/ML IJ SOSY
25.0000 ug | PREFILLED_SYRINGE | Freq: Once | INTRAMUSCULAR | Status: AC | PRN
  Administered 2024-02-24: 25 ug via INTRAVENOUS
  Filled 2024-02-24: qty 1

## 2024-02-24 MED ORDER — SODIUM CHLORIDE 0.9% FLUSH: 9.0000 mL | INTRAVENOUS | Status: DC | PRN

## 2024-02-24 MED ORDER — PANTOPRAZOLE SODIUM 40 MG IV SOLR
40.0000 mg | Freq: Every day | INTRAVENOUS | Status: DC
  Administered 2024-02-24 – 2024-03-03 (×9): 40 mg via INTRAVENOUS
  Filled 2024-02-24 (×9): qty 10

## 2024-02-24 MED ORDER — DIPHENHYDRAMINE HCL 50 MG/ML IJ SOLN: 12.5000 mg | Freq: Four times a day (QID) | INTRAMUSCULAR | Status: DC | PRN

## 2024-02-24 NOTE — Evaluation (Signed)
 Physical Therapy Evaluation Patient Details Name: Kristine [name] MRN: [id number] DOB: 1956/04/10 Today's Date: 02/24/2024  History of Present Illness  Pt is a 68 y.o. female presented to [facility] ED 02/19/24 with c/o mid abdominal pain, cramp-like. CT revealed high-grade mid small bowel obstruction within the right hemipelvis with proximal small bowel dilatation up to 3 cm and decompressed distal bowel and colon. Pt s/p exploratory laparotomy with repair of small bowel enterotomy x 2 and mall bowel stricturoplasty 10/13. PMHx: factor V Leiden, DVT, gastritis, HLD, and HTN.   Clinical Impression  Pt admitted with above diagnosis. PTA, pt was independent with functional mobility, ADLs/IADLs, and driving. She lives with her husband in a three story house with 6 STE. Pt is able to reside on the main level. Pt currently with functional limitations due to the deficits listed below (see PT Problem List). She required CGA-minA for bed mobility, CGA for transfers using RW, and CGA for gait using RW. Utilized +2 assist throughout functional mobility for safety and line management. Pt is currently limited by pain, abdominal precautions, generalized weakness, decreased balance, and impaired activity tolerance. Pt will benefit from acute skilled PT to increase her independence and safety with mobility to allow discharge. Recommend intensive inpatient follow-up therapy, >3 hours/day.    If plan is discharge home, recommend the following: A little help with walking and/or transfers;A little help with bathing/dressing/bathroom;Assistance with cooking/housework;Assist for transportation;Help with stairs or ramp for entrance   Can travel by private vehicle        Equipment Recommendations BSC/3in1  Recommendations for Other Services  Rehab consult    Functional Status Assessment Patient has had a recent decline in their functional status and demonstrates the ability to make significant improvements in function  in a reasonable and predictable amount of time.     Precautions / Restrictions Precautions Precautions: Fall;Other (comment) (Abdominal) Recall of Precautions/Restrictions: Intact Precaution/Restrictions Comments: PCAP; NG Restrictions Weight Bearing Restrictions Per Provider Order: No      Mobility  Bed Mobility Overal bed mobility: Needs Assistance Bed Mobility: Rolling, Sidelying to Sit Rolling: Contact guard assist, Used rails, +2 for safety/equipment (Line management) Sidelying to sit: Min assist, HOB elevated, Used rails, +2 for safety/equipment (Line management)       General bed mobility comments: Educated pt on log roll technique. Cues for sequening. Assist for pt to roll on her side, bring BLE off EOB, and elevate trunk.    Transfers Overall transfer level: Needs assistance Equipment used: Rolling walker (2 wheels) Transfers: Sit to/from Stand, Bed to chair/wheelchair/BSC Sit to Stand: Contact guard assist, +2 safety/equipment (Line management)   Step pivot transfers: Contact guard assist, +2 safety/equipment (Line management)       General transfer comment: Pt stood from lowest bed height. Cued proper hand placement using RW. Powered up with light assist. Transferred to recliner chair. Good eccentric control.    Ambulation/Gait Ambulation/Gait assistance: Contact guard assist, +2 safety/equipment (Line management) Gait Distance (Feet): 30 Feet Assistive device: Rolling walker (2 wheels) Gait Pattern/deviations: Step-through pattern, Decreased step length - right, Decreased step length - left, Decreased stride length Gait velocity: decreased     General Gait Details: Pt ambulated with short slow steps. She maintained body inside AD at all times. Slight fwd flex and limited foot clearence. Cues for safe navigation and line management.  Stairs            Wheelchair Mobility     Tilt Bed    Modified Rankin (Stroke Patients  Only)       Balance  Overall balance assessment: Needs assistance Sitting-balance support: No upper extremity supported, Feet supported Sitting balance-Leahy Scale: Fair     Standing balance support: Bilateral upper extremity supported, During functional activity, Reliant on assistive device for balance Standing balance-Leahy Scale: Poor                               Pertinent Vitals/Pain Pain Assessment Pain Assessment: 0-10 Pain Score: 6  Pain Location: Abdomen Pain Descriptors / Indicators: Operative site guarding, Grimacing, Discomfort, Pressure Pain Intervention(s): Monitored during session, Limited activity within patient's tolerance, Repositioned    Home Living Family/patient expects to be discharged to:: Private residence Living Arrangements: Spouse/significant other Available Help at Discharge: Family;Available 24 hours/day Type of Home: House Home Access: Stairs to enter Entrance Stairs-Rails: Right;Left;Can reach both Entrance Stairs-Number of Steps: 6   Home Layout: Multi-level;Able to live on main level with bedroom/bathroom;Full bath on main level Home Equipment: Rollator (4 wheels);Rolling Walker (2 wheels);Crutches;Shower seat;Cane - single point      Prior Function Prior Level of Function : Independent/Modified Independent;Driving             Mobility Comments: Ambulates without AD. Denies fall hx. ADLs Comments: Indep with ADLs/IADLs. 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     Extremity/Trunk Assessment   Upper Extremity Assessment Upper Extremity Assessment: Generalized weakness;Right hand dominant    Lower Extremity Assessment Lower Extremity Assessment: Generalized weakness    Cervical / Trunk Assessment Cervical / Trunk Assessment: Normal  Communication   Communication Communication: No apparent difficulties    Cognition Arousal: Alert Behavior During Therapy: WFL for tasks assessed/performed   PT -  Cognitive impairments: No apparent impairments                       PT - Cognition Comments: Pt A,Ox4 Following commands: Intact       Cueing Cueing Techniques: Verbal cues     General Comments General comments (skin integrity, edema, etc.): VSS on RA    Exercises     Assessment/Plan    PT Assessment Patient needs continued PT services  PT Problem List Decreased strength;Decreased activity tolerance;Decreased balance;Decreased mobility;Decreased knowledge of use of DME;Decreased safety awareness;Decreased knowledge of precautions;Pain       PT Treatment Interventions DME instruction;Gait training;Stair training;Functional mobility training;Therapeutic activities;Therapeutic exercise;Balance training;Patient/family education    PT Goals (Current goals can be found in the Care Plan section)  Acute Rehab PT Goals Patient Stated Goal: Regain independence and safely return home PT Goal Formulation: With patient Time For Goal Achievement: 03/09/24 Potential to Achieve Goals: Good    Frequency Min 3X/week     Co-evaluation               AM-PAC PT 6 Clicks Mobility  Outcome Measure Help needed turning from your back to your side while in a flat bed without using bedrails?: A Little Help needed moving from lying on your back to sitting on the side of a flat bed without using bedrails?: A Lot Help needed moving to and from a bed to a chair (including a wheelchair)?: A Lot Help needed standing up from a chair using your arms (e.g., wheelchair or bedside chair)?: A Lot Help needed to walk in hospital room?: A Lot Help needed climbing 3-5 steps with a railing? : Total 6 Click Score: 12  End of Session Equipment Utilized During Treatment: Gait belt Activity Tolerance: Patient tolerated treatment well Patient left: in chair;with call bell/phone within reach;with chair alarm set Nurse Communication: Mobility status PT Visit Diagnosis: Muscle weakness  (generalized) (M62.81);Difficulty in walking, not elsewhere classified (R26.2);Other abnormalities of gait and mobility (R26.89);Unsteadiness on feet (R26.81)    Time: 8554-8484 PT Time Calculation (min) (ACUTE ONLY): 30 min   Charges:   PT Evaluation $PT Eval Moderate Complexity: 1 Mod   PT General Charges $$ ACUTE PT VISIT: 1 Visit         Randall SAUNDERS, PT, DPT Acute Rehabilitation Services Office: [phone] Secure Chat Preferred  Kristine [name] 02/24/2024, 3:54 PM

## 2024-02-24 NOTE — Progress Notes (Signed)
 Progress Note  1 Day Post-Op  Subjective: Patient reports that pain is manageable. No BM or flatulence yet. Having minimal nausea. Denies vomiting. NG tube in place.  ROS  Per HPI  Objective: Vital signs in last 24 hours: Temp:  [97.4 F (36.3 C)-99.3 F (37.4 C)] 98.3 F (36.8 C) (10/14 0752) Pulse Rate:  [84-129] 87 (10/14 0752) Resp:  [13-27] 16 (10/14 0941) BP: (114-170)/(61-99) 114/61 (10/14 0752) SpO2:  [92 %-100 %] 98 % (10/14 0941) FiO2 (%):  [0 %] 0 % (10/14 0941) Weight:  [81.6 kg] 81.6 kg (10/13 1337) Last BM Date :  (PTA)  Intake/Output from previous day: 10/13 0701 - 10/14 0700 In: 2134.3 [I.V.:1909.3; NG/GT:225] Out: 1475 [Urine:325; Emesis/NG output:1100; Blood:50] Intake/Output this shift: Total I/O In: 50 [NG/GT:50] Out: -   PE: General: Pleasant female who is laying in bed in NAD. HEENT: Head is normocephalic, atraumatic. NGT in place with about 150 mL in canister.  Heart: HR normal. Lungs: Respiratory effort nonlabored. Abd: Soft with appropriate tenderness to palpation. ND. Incision intact with staples covered with honeycomb dressing. No rebound tenderness or guarding.  Skin: Warm and dry. Psych: A&Ox3 with an appropriate affect.    Lab Results:  Recent Labs    02/22/24 0259 02/24/24 0425  WBC 10.4 6.3  HGB 13.5 14.8  HCT 41.9 46.1*  PLT 166 177   BMET Recent Labs    02/22/24 0259 02/24/24 0425  NA 142 146*  K 3.7 4.3  CL 108 112*  CO2 25 21*  GLUCOSE 181* 342*  BUN 12 20  CREATININE 0.66 1.22*  CALCIUM 9.0 8.0*   PT/INR No results for input(s): LABPROT, INR in the last 72 hours. CMP     Component Value Date/Time   NA 146 (H) 02/24/2024 0425   NA 143 01/08/2018 1605   K 4.3 02/24/2024 0425   CL 112 (H) 02/24/2024 0425   CO2 21 (L) 02/24/2024 0425   GLUCOSE 342 (H) 02/24/2024 0425   BUN 20 02/24/2024 0425   BUN 12 01/08/2018 1605   CREATININE 1.22 (H) 02/24/2024 0425   CALCIUM 8.0 (L) 02/24/2024 0425   PROT  6.3 (L) 02/19/2024 1950   ALBUMIN 3.2 (L) 02/22/2024 0259   AST 18 02/19/2024 1950   ALT 14 02/19/2024 1950   ALKPHOS 62 02/19/2024 1950   BILITOT 0.5 02/19/2024 1950   GFRNONAA 48 (L) 02/24/2024 0425   GFRAA 107 01/08/2018 1605   Lipase     Component Value Date/Time   LIPASE 33 02/19/2024 1950       Studies/Results: DG Abd Portable 1V Result Date: 02/23/2024 CLINICAL DATA:  Small bowel obstruction EXAM: PORTABLE ABDOMEN - 1 VIEW COMPARISON:  Prior chest x-ray yesterday 02/22/2024 FINDINGS: Persistent gaseous distension of multiple loops of small bowel in the left mid abdomen with a maximal diameter of 4.4 cm. Findings are centrally unchanged. Gastric tube remains in good position with the tip overlying the fundus. No large free air. Multilevel degenerative disc disease. IMPRESSION: Persistent and unchanged small bowel obstruction. Maximal small bowel diameter of 4.4 cm. Gastric tube overlies the stomach. Electronically Signed   By: Wilkie Lent M.D.   On: 02/23/2024 09:07    Anti-infectives: Anti-infectives (From admission, onward)    Start     Dose/Rate Route Frequency Ordered Stop   02/23/24 1400  ceFAZolin (ANCEF) IVPB 1 g/50 mL premix        1 g 100 mL/hr over 30 Minutes Intravenous Every 8 hours 02/23/24 1222  Assessment/Plan POD 1: S/P Diagnostic laparoscopy, Exploratory laparotomy, Repair of small bowel enterotomy x 2, Mall bowel stricturoplasty by Dr. Vicenta Poli on 02/23/2024 - Afebrile. Vitals stable. - WBC 6.3; HGB 14.8 - Exam with appropriate tenderness. No significant concerns.  - Cr noted to be 1.22. Fluids being administered per primary team. - Maintain foley catheter. - Maintain NGT and keep NPO. Total NGT output noted to be 1100 from 10/13-10/14. - Will continue to follow    FEN: NPO; NGT; IVF per primary team VTE: heparin injections ID: Ancef for surgical ppx    LOS: 4 days   I reviewed specialist notes, nursing notes,  hospitalist notes, last 24 h vitals and pain scores, last 48 h intake and output, last 24 h labs and trends, and last 24 h imaging results.   Marjorie Carlyon Favre, [facility] 02/24/2024, 11:48 AM Please see Amion for pager number during day hours 7:00am-4:30pm

## 2024-02-24 NOTE — Progress Notes (Signed)
 PROGRESS NOTE  Kristine [name]  DOB: 1956-03-02  PCP: Kristine [name] LABOR, MD FMW:[id number]  DOA: 02/19/2024  LOS: 4 days  Hospital Day: 6  Subjective: Patient was seen and examined this morning.  Pleasant elderly Caucasian female. Propped up in bed.  Not in distress.  Pain partially controlled. POD 1 Feels bowel gurgling, not passing gas yet Husband at bedside Remains afebrile, hemodynamically stable Labs from this morning with sodium 146, glucose elevated to 342, BUN/creatinine 20/1.22  Brief narrative: Kristine [name] is a 68 y.o. female with PMH significant for HTN, HLD, factor V Leiden, DVT currently not on anticoagulation, GERD, anxiety/depression,; PSH including cesarean section, cholecystectomy, tubal ligation, right oophorectomy. 10/9, patient presented to the ED with complaint of cramping abdominal pain for several hours associated with nausea. CT angio of the chest, abdomen and pelvis revealed high-grade mid small bowel obstruction within the right hemipelvis with proximal small bowel dilatation up to 3 cm and decompressed distal bowel and colon.    General Surgery was consulted Admitted to [facility] Started on conservative management for bowel obstruction with bowel rest, NG suction, IV hydration. However, did not have significant improvement despite 72 hours of conservative management. 10/13, underwent ex lap, repair of small bowel enterotomy, small bowel stricturoplasty.  Assessment and plan: Small bowel obstruction S/p ex lap, stricturoplasty -1013 Dr. Vernetta Presented with small bowel obstruction in the setting of multiple prior abdominal surgeries.   SBO did not resolve with several days of conservative management and ultimately required surgery Currently in postop ileus, remains n.p.o. General Surgery following Continue IV hydration. Pain regimen --- Scheduled: Acetaminophen IV 1 g every 6 hours, morphine PCA --- PRN:   AKI Noted postop rise in  creatinine. Increase IV fluid rate to NS at 125 mL/h Repeat labs in the morning  Recent Labs    09/30/23 1027 02/19/24 1950 02/21/24 0750 02/22/24 0259 02/24/24 0425  BUN 17 15 10 12 20   CREATININE 0.72 0.93 0.70 0.66 1.22*  CO2 27 27 23 25  21*   Hypernatremia Sodium level up to thousand volume depletion. Continue to monitor Recent Labs  Lab 02/19/24 1950 02/21/24 0750 02/22/24 0259 02/24/24 0425  NA 142 141 142 146*   Type 2 diabetes mellitus A1c 5.8 on 12/23/2023 PTA meds-metformin  1000 mg daily.  Currently on hold Currently on SSI/Accu-Cheks. On D5 NS at 75 mL/h currently.  Blood sugar level running elevated.  Switch to NS at heart rate of 125 mL/h Recent Labs  Lab 02/23/24 2128 02/23/24 2356 02/24/24 0404 02/24/24 0756 02/24/24 1211  GLUCAP 179* 207* 185* 174* 129*   Hypertension H/o PVCs PTA meds- Toprol  25 mg daily, Lasix  PRN Currently on IV metoprolol  5 mg every 8 hours scheduled. Would advise telemetry monitoring given history of PVCs but patient would not like them on. If electrolytes like potassium and magnesium  in the range, she is agreeable to putting telemetry on  Continue to monitor electrolytes with postop ileus Recent Labs  Lab 02/19/24 1950 02/21/24 0750 02/22/24 0259 02/24/24 0425  K 4.3 4.7 3.7 4.3  MG  --   --  1.9  --   PHOS  --   --  3.2  --     H/o factor V Leiden and prior DVTs Was on anticoagulation for 1 year at some point  Since then, not on chronic anticoagulation  Continue DVT prophylaxis with subcutaneous heparin.  HLD PTA meds- aspirin 81 mg daily, Zocor  10 mg daily Currently both on hold  GERD Restart  PPI IV  Anxiety/depression PTA meds- bupropion  300 mg daily Resume once oral intake ensured   Mobility: PT eval ordered PT Orders:   PT Follow up Rec:    Goals of care   Code Status: Full Code     DVT prophylaxis:  Place and maintain sequential compression device Start: 02/24/24 0043 heparin injection  5,000 Units Start: 02/20/24 0745   Antimicrobials: Perioperative antibiotics Fluid: NS at 125 mL/h Consultants: General Surgery Family Communication: Husband at bedside  Status: Inpatient Level of care:  Telemetry Medical.  But she does not want to have telemetry monitor on  Patient is from: Home Needs to continue in-hospital care: Currently postop ileus Anticipated d/c to: Clinical course  Diet:  Diet Order             Diet NPO time specified Except for: Ice Chips  Diet effective now                   Scheduled Meds:  Chlorhexidine Gluconate Cloth  6 each Topical Daily   fentaNYL (SUBLIMAZE) injection  25 mcg Intravenous Once   fluticasone   2 spray Each Nare Daily   heparin  5,000 Units Subcutaneous Q8H   insulin aspart  0-9 Units Subcutaneous Q4H   metoprolol  tartrate  5 mg Intravenous Q8H   morphine   Intravenous Q4H   pantoprazole (PROTONIX) IV  40 mg Intravenous QAC breakfast   sodium chloride  flush  10-40 mL Intracatheter Q12H    PRN meds: diphenhydrAMINE **OR** diphenhydrAMINE, naloxone **AND** sodium chloride  flush, ondansetron (ZOFRAN) IV, prochlorperazine, sodium chloride  flush   Infusions:   sodium chloride  125 mL/hr at 02/24/24 0953   acetaminophen 1,000 mg (02/24/24 1222)    ceFAZolin (ANCEF) IV 1 g (02/24/24 0808)    Antimicrobials: Anti-infectives (From admission, onward)    Start     Dose/Rate Route Frequency Ordered Stop   02/23/24 1400  ceFAZolin (ANCEF) IVPB 1 g/50 mL premix        1 g 100 mL/hr over 30 Minutes Intravenous Every 8 hours 02/23/24 1222         Objective: Vitals:   02/24/24 0941 02/24/24 1226  BP:  129/68  Pulse:  91  Resp: 16 16  Temp:    SpO2: 98% 96%    Intake/Output Summary (Last 24 hours) at 02/24/2024 1355 Last data filed at 02/24/2024 1236 Gross per 24 hour  Intake 2109.33 ml  Output 825 ml  Net 1284.33 ml   Filed Weights   02/19/24 1931 02/23/24 1337  Weight: 81.6 kg 81.6 kg   Weight change:   Body mass index is 29.5 kg/m.   Physical Exam: General exam: Pleasant, elderly Caucasian female Skin: No rashes, lesions or ulcers. HEENT: Atraumatic, normocephalic, no obvious bleeding Lungs: Clear to auscultation bilaterally,  CVS: S1, S2, no murmur,   GI/Abd: Mild appropriate postop tenderness, nondistended, bowel sound mild,   CNS: Alert, awake, oriented x 3 Psychiatry: Mood appropriate Extremities: No pedal edema, no calf tenderness,   Data Review: I have personally reviewed the laboratory data and studies available.  F/u labs ordered Unresulted Labs (From admission, onward)     Start     Ordered   Unscheduled  Basic metabolic panel with GFR  Tomorrow morning,   R        02/24/24 1355   Unscheduled  CBC with Differential/Platelet  Tomorrow morning,   R        02/24/24 1355   Unscheduled  Magnesium   Tomorrow morning,  R        02/24/24 1355   Unscheduled  Phosphorus  Tomorrow morning,   R        02/24/24 1355            Signed, Chapman Rota, MD [facility] 02/24/2024

## 2024-02-24 NOTE — Progress Notes (Signed)
 VAST consult received to obtain 2nd IV access for multiple IV medications. Bilateral arms assessed utilizing ultrasound; patient with extremely limited vasculature.  No appropriate vessels in left arm for cannulation; currently has a midline in upper left arm that is no longer giving good blood return but is infusing without difficulty. Able to place a 22G USGIV in right anterior forearm just distal to [facility] area.  If patient will need prolonged or multiple access for IV medications, PICC or central line is recommended due to limited vasculature.

## 2024-02-25 DIAGNOSIS — K56609 Unspecified intestinal obstruction, unspecified as to partial versus complete obstruction: Secondary | ICD-10-CM | POA: Diagnosis not present

## 2024-02-25 LAB — GLUCOSE, CAPILLARY
Glucose-Capillary: 103 mg/dL — ABNORMAL HIGH (ref 70–99)
Glucose-Capillary: 114 mg/dL — ABNORMAL HIGH (ref 70–99)
Glucose-Capillary: 123 mg/dL — ABNORMAL HIGH (ref 70–99)
Glucose-Capillary: 69 mg/dL — ABNORMAL LOW (ref 70–99)
Glucose-Capillary: 83 mg/dL (ref 70–99)
Glucose-Capillary: 84 mg/dL (ref 70–99)
Glucose-Capillary: 85 mg/dL (ref 70–99)
Glucose-Capillary: 95 mg/dL (ref 70–99)

## 2024-02-25 LAB — BASIC METABOLIC PANEL WITH GFR
Anion gap: 11 (ref 5–15)
BUN: 25 mg/dL — ABNORMAL HIGH (ref 8–23)
CO2: 21 mmol/L — ABNORMAL LOW (ref 22–32)
Calcium: 8 mg/dL — ABNORMAL LOW (ref 8.9–10.3)
Chloride: 114 mmol/L — ABNORMAL HIGH (ref 98–111)
Creatinine, Ser: 1.05 mg/dL — ABNORMAL HIGH (ref 0.44–1.00)
GFR, Estimated: 58 mL/min — ABNORMAL LOW (ref >60)
Glucose, Bld: 94 mg/dL (ref 70–99)
Potassium: 3.6 mmol/L (ref 3.5–5.1)
Sodium: 146 mmol/L — ABNORMAL HIGH (ref 135–145)

## 2024-02-25 LAB — CBC WITH DIFFERENTIAL/PLATELET
Abs Immature Granulocytes: 0.06 K/uL (ref 0.00–0.07)
Basophils Absolute: 0 K/uL (ref 0.0–0.1)
Basophils Relative: 0 %
Eosinophils Absolute: 0 K/uL (ref 0.0–0.5)
Eosinophils Relative: 0 %
HCT: 42.5 % (ref 36.0–46.0)
Hemoglobin: 13.4 g/dL (ref 12.0–15.0)
Immature Granulocytes: 1 %
Lymphocytes Relative: 11 %
Lymphs Abs: 1 K/uL (ref 0.7–4.0)
MCH: 28.6 pg (ref 26.0–34.0)
MCHC: 31.5 g/dL (ref 30.0–36.0)
MCV: 90.6 fL (ref 80.0–100.0)
Monocytes Absolute: 0.6 K/uL (ref 0.1–1.0)
Monocytes Relative: 7 %
Neutro Abs: 7.8 K/uL — ABNORMAL HIGH (ref 1.7–7.7)
Neutrophils Relative %: 81 %
Platelets: 118 K/uL — ABNORMAL LOW (ref 150–400)
RBC: 4.69 MIL/uL (ref 3.87–5.11)
RDW: 13.6 % (ref 11.5–15.5)
WBC: 9.6 K/uL (ref 4.0–10.5)
nRBC: 0 % (ref 0.0–0.2)

## 2024-02-25 LAB — MAGNESIUM: Magnesium: 1.6 mg/dL — ABNORMAL LOW (ref 1.7–2.4)

## 2024-02-25 LAB — PHOSPHORUS: Phosphorus: 3.4 mg/dL (ref 2.5–4.6)

## 2024-02-25 MED ORDER — SODIUM CHLORIDE 0.9 % IV BOLUS
500.0000 mL | Freq: Once | INTRAVENOUS | Status: AC
  Administered 2024-02-25: 500 mL via INTRAVENOUS

## 2024-02-25 MED ORDER — DEXTROSE 50 % IV SOLN
INTRAVENOUS | Status: AC
  Filled 2024-02-25: qty 50

## 2024-02-25 MED ORDER — METHOCARBAMOL 1000 MG/10ML IJ SOLN
500.0000 mg | Freq: Three times a day (TID) | INTRAMUSCULAR | Status: DC
  Administered 2024-02-25 (×2): 500 mg via INTRAVENOUS
  Administered 2024-02-26: 250 mg via INTRAVENOUS
  Administered 2024-02-26 – 2024-02-27 (×3): 500 mg via INTRAVENOUS
  Filled 2024-02-25 (×7): qty 10

## 2024-02-25 MED ORDER — MAGNESIUM SULFATE 2 GM/50ML IV SOLN
2.0000 g | Freq: Once | INTRAVENOUS | Status: AC
  Administered 2024-02-25: 2 g via INTRAVENOUS
  Filled 2024-02-25: qty 50

## 2024-02-25 MED ORDER — DEXTROSE 50 % IV SOLN
12.5000 g | INTRAVENOUS | Status: AC
  Administered 2024-02-25: 12.5 g via INTRAVENOUS

## 2024-02-25 MED ORDER — DEXTROSE 10 % IV SOLN: INTRAVENOUS | Status: DC

## 2024-02-25 MED ORDER — ACETAMINOPHEN 10 MG/ML IV SOLN
1000.0000 mg | Freq: Four times a day (QID) | INTRAVENOUS | Status: AC
Start: 2024-02-25 — End: 2024-02-26
  Administered 2024-02-25 – 2024-02-26 (×4): 1000 mg via INTRAVENOUS
  Filled 2024-02-25 (×4): qty 100

## 2024-02-25 NOTE — Progress Notes (Signed)
 PROGRESS NOTE  Kristine [name]  DOB: Sep 21, 1955  PCP: [name] LABOR, MD FMW:[id number]  DOA: 02/19/2024  LOS: 5 days  Hospital Day: 7  Subjective: Patient was seen and examined this morning.   Propped up in bed.  Not in distress. No gas or BM yet. Family not at bedside today. In the last 24 hours, afebrile, blood pressure in 140s to 160s, breathing on 2 L oxygen Labs this morning with sodium 146, chloride 114, creatinine better at 1.05  Brief narrative: Kristine [name] is a 68 y.o. female with PMH significant for HTN, HLD, factor V Leiden, DVT currently not on anticoagulation, GERD, anxiety/depression,; PSH including cesarean section, cholecystectomy, tubal ligation, right oophorectomy. 10/9, patient presented to the ED with complaint of cramping abdominal pain for several hours associated with nausea. CT angio of the chest, abdomen and pelvis revealed high-grade mid small bowel obstruction within the right hemipelvis with proximal small bowel dilatation up to 3 cm and decompressed distal bowel and colon.    General Surgery was consulted Admitted to [facility] Started on conservative management for bowel obstruction with bowel rest, NG suction, IV hydration. However, did not have significant improvement despite 72 hours of conservative management. 10/13, underwent ex lap, repair of small bowel enterotomy, small bowel stricturoplasty.  Assessment and plan: Small bowel obstruction S/p ex lap, stricturoplasty -1013 Dr. Vernetta Presented with small bowel obstruction in the setting of multiple prior abdominal surgeries.   SBO did not resolve with several days of conservative management and ultimately required surgery Currently in postop ileus, remains n.p.o. Has not passed gas or had a bowel movement.  NG tube suction remains. General Surgery following Continue IV hydration. Pain regimen --- Scheduled: Acetaminophen IV 1 g every 6 hours, morphine PCA --- PRN:    Hypomagnesemia Magnesium  level low at 1.6 today.  Replacement ordered Recent Labs  Lab 02/19/24 1950 02/21/24 0750 02/22/24 0259 02/24/24 0425 02/25/24 0451  K 4.3 4.7 3.7 4.3 3.6  MG  --   --  1.9  --  1.6*  PHOS  --   --  3.2  --  3.4   AKI Noted postop rise in creatinine. With IV hydration, creatinine is better today.  350 mL of urine output in the last 24 hours noted. Continue to monitor  Recent Labs    09/30/23 1027 02/19/24 1950 02/21/24 0750 02/22/24 0259 02/24/24 0425 02/25/24 0451  BUN 17 15 10 12 20  25*  CREATININE 0.72 0.93 0.70 0.66 1.22* 1.05*  CO2 27 27 23 25  21* 21*   Hypernatremia Sodium and chloride level are both elevated.   Raises a suspicion of overhydration but only 350 mL of urine output in the last 24 hours noted.  Continue IV fluid for today Continue to monitor labs Recent Labs  Lab 02/19/24 1950 02/21/24 0750 02/22/24 0259 02/24/24 0425 02/25/24 0451  NA 142 141 142 146* 146*   Prediabetes A1c 5.8 on 12/23/2023 Patient denies history of diabetes.  States she was taking metformin  1000 mg daily for weight loss. Currently on hold Currently on SSI/Accu-Cheks. Blood sugar level less than 100 consistently. Recent Labs  Lab 02/24/24 2102 02/25/24 0030 02/25/24 0401 02/25/24 0757 02/25/24 1144  GLUCAP 106* 95 85 84 83   Hypertension H/o PVCs PTA meds- Toprol  25 mg daily, Lasix  PRN Currently on IV metoprolol  5 mg every 8 hours scheduled. Would advise telemetry monitoring given history of PVCs but patient would not like them on. If electrolytes like potassium and magnesium  are  low, she is agreeable to putting telemetry on  Continue to monitor electrolytes with postop ileus   H/o factor V Leiden and prior DVTs Was on anticoagulation for 1 year at some point  Since then, not on chronic anticoagulation  Continue DVT prophylaxis with subcutaneous heparin.  HLD PTA meds- aspirin 81 mg daily, Zocor  10 mg daily Currently both on  hold  GERD Restart PPI IV  Anxiety/depression PTA meds- bupropion  300 mg daily Resume once oral intake ensured   Mobility: PT eval obtained PT Orders:   PT Follow up Rec: Acute Inpatient Rehab (3hours/Day)02/24/2024 1400   Goals of care   Code Status: Full Code     DVT prophylaxis:  Place and maintain sequential compression device Start: 02/24/24 0043 heparin injection 5,000 Units Start: 02/20/24 0745   Antimicrobials: Perioperative antibiotics Fluid: NS at 125 mL/h to continue Consultants: General Surgery Family Communication: Husband at bedside  Status: Inpatient Level of care:  Telemetry Medical.  But patient does not want to have telemetry monitor on yet  Patient is from: Home Needs to continue in-hospital care: Currently postop ileus Anticipated d/c to: Pending clinical course  Diet:  Diet Order             Diet NPO time specified Except for: Ice Chips  Diet effective now                   Scheduled Meds:  Chlorhexidine Gluconate Cloth  6 each Topical Daily   fluticasone   2 spray Each Nare Daily   heparin  5,000 Units Subcutaneous Q8H   insulin aspart  0-9 Units Subcutaneous Q4H   methocarbamol (ROBAXIN) injection  500 mg Intravenous Q8H   metoprolol  tartrate  5 mg Intravenous Q8H   morphine   Intravenous Q4H   pantoprazole (PROTONIX) IV  40 mg Intravenous QAC breakfast   sodium chloride  flush  10-40 mL Intracatheter Q12H    PRN meds: diphenhydrAMINE **OR** diphenhydrAMINE, naloxone **AND** sodium chloride  flush, ondansetron (ZOFRAN) IV, prochlorperazine, sodium chloride  flush   Infusions:   sodium chloride  125 mL/hr at 02/25/24 0443   acetaminophen 1,000 mg (02/25/24 1226)    Antimicrobials: Anti-infectives (From admission, onward)    Start     Dose/Rate Route Frequency Ordered Stop   02/23/24 1400  ceFAZolin (ANCEF) IVPB 1 g/50 mL premix  Status:  Discontinued        1 g 100 mL/hr over 30 Minutes Intravenous Every 8 hours 02/23/24  1222 02/25/24 1016       Objective: Vitals:   02/25/24 1226 02/25/24 1227  BP:  137/67  Pulse:  76  Resp: 16 15  Temp:  97.9 F (36.6 C)  SpO2: 99% 99%    Intake/Output Summary (Last 24 hours) at 02/25/2024 1358 Last data filed at 02/25/2024 1252 Gross per 24 hour  Intake 539.55 ml  Output 1700 ml  Net -1160.45 ml   Filed Weights   02/19/24 1931 02/23/24 1337  Weight: 81.6 kg 81.6 kg   Weight change:  Body mass index is 29.5 kg/m.   Physical Exam: General exam: Pleasant, elderly Caucasian female Skin: No rashes, lesions or ulcers. HEENT: Atraumatic, normocephalic, no obvious bleeding Lungs: Clear to auscultation bilaterally,  CVS: S1, S2, no murmur,   GI/Abd: Mild appropriate postop tenderness, nondistended, bowel sound not heard today. CNS: Alert, awake, oriented x 3 Psychiatry: Mood appropriate Extremities: No pedal edema, no calf tenderness,   Data Review: I have personally reviewed the laboratory data and studies available.  F/u labs ordered Unresulted Labs (From admission, onward)     Start     Ordered   02/26/24 0500  Prealbumin  Tomorrow morning,   R        02/25/24 1021   02/26/24 0500  Comprehensive metabolic panel  Tomorrow morning,   R        02/25/24 1022   02/26/24 0500  CBC with Differential/Platelet  Tomorrow morning,   R        02/25/24 1355            Signed, Chapman Rota, MD [facility] 02/25/2024

## 2024-02-25 NOTE — Progress Notes (Signed)
 Inpatient Rehab Admissions Coordinator:   Per therapy recommendations pt was screened for CIR by Reche Lowers, PT, DPT.  Note s/p exploratory lap, NGT to suction, possibility of TPN if bowel function does not return soon.  On therapy evaluations was CGA for mobility, up to max assist for LB ADLs.  Will follow up and rescreen on Friday for progression towards medical readiness and functional status.  Please contact me with questions.   Reche Lowers, PT, DPT Admissions Coordinator [phone] 02/25/24  10:51 AM

## 2024-02-25 NOTE — Progress Notes (Signed)
   Progress Note  2 Days Post-Op  Subjective: Patient sitting up in chair this AM, reports she got up a few times yesterday as well. No bowel function. Taking in ice chips with NGT.   Objective: Vital signs in last 24 hours: Temp:  [98.1 F (36.7 C)-98.6 F (37 C)] 98.2 F (36.8 C) (10/15 0754) Pulse Rate:  [90-97] 93 (10/15 0754) Resp:  [16-22] 20 (10/15 0807) BP: (122-165)/(62-91) 149/69 (10/15 0754) SpO2:  [95 %-98 %] 97 % (10/15 0807) FiO2 (%):  [0 %] 0 % (10/15 0807) Last BM Date :  (PTA)  Intake/Output from previous day: 10/14 0701 - 10/15 0700 In: 559.6 [I.V.:309.6; NG/GT:250] Out: 1450 [Urine:350; Emesis/NG output:1100] Intake/Output this shift: No intake/output data recorded.  PE: General: Pleasant female, WD/WN, sitting up in chair Heart: RRR Lungs: Respiratory effort nonlabored. Abd: Soft with appropriate tenderness to palpation. Mild distention, NGT with bilious drainage and clogged sump port (flushed with air and replaced filter), BS hypoactive  Skin: Warm and dry. Psych: A&Ox3 with an appropriate affect.    Lab Results:  Recent Labs    02/24/24 0425 02/25/24 0451  WBC 6.3 9.6  HGB 14.8 13.4  HCT 46.1* 42.5  PLT 177 118*   BMET Recent Labs    02/24/24 0425 02/25/24 0451  NA 146* 146*  K 4.3 3.6  CL 112* 114*  CO2 21* 21*  GLUCOSE 342* 94  BUN 20 25*  CREATININE 1.22* 1.05*  CALCIUM 8.0* 8.0*   PT/INR No results for input(s): LABPROT, INR in the last 72 hours. CMP     Component Value Date/Time   NA 146 (H) 02/25/2024 0451   NA 143 01/08/2018 1605   K 3.6 02/25/2024 0451   CL 114 (H) 02/25/2024 0451   CO2 21 (L) 02/25/2024 0451   GLUCOSE 94 02/25/2024 0451   BUN 25 (H) 02/25/2024 0451   BUN 12 01/08/2018 1605   CREATININE 1.05 (H) 02/25/2024 0451   CALCIUM 8.0 (L) 02/25/2024 0451   PROT 6.3 (L) 02/19/2024 1950   ALBUMIN 3.2 (L) 02/22/2024 0259   AST 18 02/19/2024 1950   ALT 14 02/19/2024 1950   ALKPHOS 62 02/19/2024 1950    BILITOT 0.5 02/19/2024 1950   GFRNONAA 58 (L) 02/25/2024 0451   GFRAA 107 01/08/2018 1605   Lipase     Component Value Date/Time   LIPASE 33 02/19/2024 1950       Studies/Results: No results found.   Anti-infectives: Anti-infectives (From admission, onward)    Start     Dose/Rate Route Frequency Ordered Stop   02/23/24 1400  ceFAZolin (ANCEF) IVPB 1 g/50 mL premix        1 g 100 mL/hr over 30 Minutes Intravenous Every 8 hours 02/23/24 1222          Assessment/Plan POD2 S/P Diagnostic laparoscopy, Exploratory laparotomy, Repair of small bowel enterotomy x 2, Mall bowel stricturoplasty by Dr. Vicenta Poli on 02/23/2024 - Afebrile. VSS, no leukocytosis  - will discuss with surgical attending if foley can come out - await return in bowel function, continue NGT to LIWS - may need to consider TPN soon if not able to take PO - mobilize as tolerated    FEN: NPO; NGT; IVF per primary team VTE: SQH ID: Ancef for surgical ppx    LOS: 5 days     Burnard JONELLE Louder, [facility] Surgery 02/25/2024, 10:14 AM Please see Amion for pager number during day hours 7:00am-4:30pm

## 2024-02-25 NOTE — Evaluation (Addendum)
 Occupational Therapy Evaluation Patient Details Name: Kristine [name] MRN: [id number] DOB: 1955-08-07 Today's Date: 02/25/2024   History of Present Illness   Pt is a 68 y.o. female presented to [facility] ED 02/19/24 with c/o mid abdominal pain, cramp-like. CT revealed high-grade mid small bowel obstruction within the right hemipelvis with proximal small bowel dilatation up to 3 cm and decompressed distal bowel and colon. Pt s/p exploratory laparotomy with repair of small bowel enterotomy x 2 and mall bowel stricturoplasty 10/13. PMHx: factor V Leiden, DVT, gastritis, HLD, and HTN.     Clinical Impressions PTA Pt was independent with all ADL/IADL tasks including driving and worked PRN. Pt required no AD for ambulation. She lives with her husband in a multi-level house and 6 STE; able to reside on main level. Pt currently requires up to Max A for ADL engagement. Pt engaged in functional mobility with Min A +2 and RW for physical assistance and line management. Pt able to maintain EOB sitting balance for ~20 minutes and participate in one grooming task with set-up A, endorsed feelings of nausea and dizziness. BP assessed to be 137/75 (91). Pt benefited from increased time to engage in evaluation tasks. Pt is primarily limited by generalized weakness, pain, abdominal precautions, unsteadiness on feet, and decreased activity tolerance and endurance. Pt will benefit from acute skilled OT services to facilitate progress towards functional goals and maximize occupation independence. Patient will benefit from intensive inpatient follow-up therapy, >3 hours/day.       If plan is discharge home, recommend the following:   A little help with walking and/or transfers;A little help with bathing/dressing/bathroom;Assistance with cooking/housework;Assistance with feeding;Assist for transportation;Help with stairs or ramp for entrance     Functional Status Assessment   Patient has had a recent decline in their  functional status and demonstrates the ability to make significant improvements in function in a reasonable and predictable amount of time.     Equipment Recommendations   Other (comment) (defer)     Recommendations for Other Services         Precautions/Restrictions   Precautions Precautions: Fall;Other (comment) (Abdominal) Recall of Precautions/Restrictions: Intact Precaution/Restrictions Comments: PCAP and NG Restrictions Weight Bearing Restrictions Per Provider Order: No     Mobility Bed Mobility Overal bed mobility: Needs Assistance Bed Mobility: Rolling, Sidelying to Sit Rolling: Contact guard assist, Used rails, +2 for safety/equipment (line management) Sidelying to sit: Min assist, HOB elevated, +2 for safety/equipment, Used rails (line management)       General bed mobility comments: Pt re-educated on log roll technique, able to return demonstration with cues for sequencing and HHA for Pt to elevate trunk from sidelying position. Pt able to manage BLE with increased time. Pt requested pillow for comfort over abdomen throughout log roll.    Transfers Overall transfer level: Needs assistance Equipment used: Rolling walker (2 wheels) Transfers: Sit to/from Stand, Bed to chair/wheelchair/BSC Sit to Stand: Min assist, +2 physical assistance, +2 safety/equipment     Step pivot transfers: Contact guard assist, +2 safety/equipment     General transfer comment: Pt stood from bed at lowest height with Min A +2 physical assistance and line management. Pt required verbal cues for proper hand placement. Pt unable to clear bed on first stand attempt, requiring increased trunk facilitation to rise from bed. Transfer to recliner chair. Good control to sit. Pt endorsed increased pain and feelings of nausea with transfer. Pt encouraged to use PCA pump as needed.      Balance Overall  balance assessment: Needs assistance Sitting-balance support: Single extremity supported,  Feet supported Sitting balance-Leahy Scale: Fair Sitting balance - Comments: Pt sat EOB for ~20 minutes. Pt endorsed feelings of dizziness during sitting. Pt provided with cool washcloth. BP assessed to be 137/75 (91). With increased time, Pt safe to continue engagement in session.   Standing balance support: Bilateral upper extremity supported, During functional activity, Reliant on assistive device for balance Standing balance-Leahy Scale: Poor Standing balance comment: Reliant on RW for standing balance                           ADL either performed or assessed with clinical judgement   ADL Overall ADL's : Needs assistance/impaired Eating/Feeding: NPO Eating/Feeding Details (indicate cue type and reason): self-feeding ice chips with set-up Grooming: Brushing hair;Wash/dry face;Set up;Sitting   Upper Body Bathing: Minimal assistance;Sitting   Lower Body Bathing: Moderate assistance;Sitting/lateral leans   Upper Body Dressing : Minimal assistance;Sitting   Lower Body Dressing: Maximal assistance   Toilet Transfer: Minimal assistance;+2 for physical assistance;+2 for safety/equipment;Ambulation;Rolling walker (2 wheels)   Toileting- Clothing Manipulation and Hygiene: Total assistance       Functional mobility during ADLs: Minimal assistance;+2 for physical assistance;+2 for safety/equipment;Rolling walker (2 wheels) General ADL Comments: Pt endorses using figure four position for LB dressing PTA. Not attempted this session d/t pain.     Vision Patient Visual Report: No change from baseline Vision Assessment?: No apparent visual deficits     Perception         Praxis         Pertinent Vitals/Pain Pain Assessment Pain Assessment: 0-10 Pain Score: 2  Pain Location: Abdomen Pain Descriptors / Indicators: Grimacing, Guarding, Discomfort, Operative site guarding Pain Intervention(s): Limited activity within patient's tolerance, Monitored during session, PCA  encouraged     Extremity/Trunk Assessment Upper Extremity Assessment Upper Extremity Assessment: Generalized weakness   Lower Extremity Assessment Lower Extremity Assessment: Generalized weakness   Cervical / Trunk Assessment Cervical / Trunk Assessment: Normal   Communication Communication Communication: No apparent difficulties   Cognition Arousal: Alert Behavior During Therapy: WFL for tasks assessed/performed Cognition: No apparent impairments                               Following commands: Intact       Cueing  General Comments   Cueing Techniques: Verbal cues;Gestural cues  Pt on 2L.   Exercises     Shoulder Instructions      Home Living Family/patient expects to be discharged to:: Private residence Living Arrangements: Spouse/significant other Available Help at Discharge: Family;Available 24 hours/day Type of Home: House Home Access: Stairs to enter [employer] of Steps: 6 Entrance Stairs-Rails: Right;Left;Can reach both Home Layout: Multi-level;Able to live on main level with bedroom/bathroom     Bathroom Shower/Tub: Producer, television/film/video: Handicapped height Bathroom Accessibility: Yes How Accessible: Accessible via wheelchair;Accessible via walker Home Equipment: Rolling Walker (2 wheels);Rollator (4 wheels);Cane - single point;Shower seat;Crutches;Hand held shower head          Prior Functioning/Environment Prior Level of Function : Independent/Modified Independent;Driving             Mobility Comments: Ambulates without AD. Denies fall hx. Pt reports bone on bone in both knees. ADLs Comments: Indep with ADLs/IADLs. 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    OT Problem List:  Decreased strength;Decreased activity tolerance;Impaired balance (sitting and/or standing);Decreased knowledge of use of DME or AE;Pain   OT Treatment/Interventions: Self-care/ADL  training;Therapeutic exercise;Energy conservation;DME and/or AE instruction;Therapeutic activities;Balance training;Patient/family education      OT Goals(Current goals can be found in the care plan section)   Acute Rehab OT Goals Patient Stated Goal: To get better OT Goal Formulation: With patient Time For Goal Achievement: 03/10/24 Potential to Achieve Goals: Good ADL Goals Pt Will Perform Grooming: with set-up;standing Pt Will Perform Lower Body Bathing: with contact guard assist;sitting/lateral leans Pt Will Perform Lower Body Dressing: with contact guard assist;sitting/lateral leans (figure four position) Pt Will Transfer to Toilet: with contact guard assist;ambulating Additional ADL Goal #1: Pt will engage in 10 minute OOB ADL task with CGA to facilitate activity tolerance.   OT Frequency:  Min 2X/week (2-3x/wk goal, attempt CIR)    Co-evaluation              AM-PAC OT 6 Clicks Daily Activity     Outcome Measure Help from another person eating meals?: Total Help from another person taking care of personal grooming?: A Little Help from another person toileting, which includes using toliet, bedpan, or urinal?: Total Help from another person bathing (including washing, rinsing, drying)?: A Lot Help from another person to put on and taking off regular upper body clothing?: A Little Help from another person to put on and taking off regular lower body clothing?: A Lot 6 Click Score: 12   End of Session Equipment Utilized During Treatment: Gait belt;Rolling walker (2 wheels) Nurse Communication: Mobility status  Activity Tolerance: Patient tolerated treatment well Patient left: in chair;with call bell/phone within reach;with chair alarm set  OT Visit Diagnosis: Unsteadiness on feet (R26.81);Muscle weakness (generalized) (M62.81);Pain Pain - part of body:  (abdomen)                Time: 9180-9150 OT Time Calculation (min): 30 min Charges:  OT General Charges $OT  Visit: 1 Visit OT Evaluation $OT Eval Moderate Complexity: 1 Mod OT Treatments $Therapeutic Activity: 8-22 mins  Maurilio CROME, OTR/L.  [facility] Acute Rehabilitation  Office: [phone]   Maurilio PARAS Elissa Grieshop 02/25/2024, 9:25 AM

## 2024-02-26 DIAGNOSIS — K56609 Unspecified intestinal obstruction, unspecified as to partial versus complete obstruction: Secondary | ICD-10-CM | POA: Diagnosis not present

## 2024-02-26 LAB — CBC WITH DIFFERENTIAL/PLATELET
Abs Immature Granulocytes: 0.07 K/uL (ref 0.00–0.07)
Basophils Absolute: 0 K/uL (ref 0.0–0.1)
Basophils Relative: 0 %
Eosinophils Absolute: 0.1 K/uL (ref 0.0–0.5)
Eosinophils Relative: 1 %
HCT: 39.2 % (ref 36.0–46.0)
Hemoglobin: 12.3 g/dL (ref 12.0–15.0)
Immature Granulocytes: 1 %
Lymphocytes Relative: 13 %
Lymphs Abs: 1.1 K/uL (ref 0.7–4.0)
MCH: 28.4 pg (ref 26.0–34.0)
MCHC: 31.4 g/dL (ref 30.0–36.0)
MCV: 90.5 fL (ref 80.0–100.0)
Monocytes Absolute: 0.4 K/uL (ref 0.1–1.0)
Monocytes Relative: 5 %
Neutro Abs: 7 K/uL (ref 1.7–7.7)
Neutrophils Relative %: 80 %
Platelets: 120 K/uL — ABNORMAL LOW (ref 150–400)
RBC: 4.33 MIL/uL (ref 3.87–5.11)
RDW: 13.5 % (ref 11.5–15.5)
WBC: 8.8 K/uL (ref 4.0–10.5)
nRBC: 0 % (ref 0.0–0.2)

## 2024-02-26 LAB — COMPREHENSIVE METABOLIC PANEL WITH GFR
ALT: 14 U/L (ref 0–44)
AST: 13 U/L — ABNORMAL LOW (ref 15–41)
Albumin: 2.1 g/dL — ABNORMAL LOW (ref 3.5–5.0)
Alkaline Phosphatase: 64 U/L (ref 38–126)
Anion gap: 10 (ref 5–15)
BUN: 19 mg/dL (ref 8–23)
CO2: 21 mmol/L — ABNORMAL LOW (ref 22–32)
Calcium: 8.1 mg/dL — ABNORMAL LOW (ref 8.9–10.3)
Chloride: 112 mmol/L — ABNORMAL HIGH (ref 98–111)
Creatinine, Ser: 0.76 mg/dL (ref 0.44–1.00)
GFR, Estimated: >60 mL/min (ref >60)
Glucose, Bld: 111 mg/dL — ABNORMAL HIGH (ref 70–99)
Potassium: 3.1 mmol/L — ABNORMAL LOW (ref 3.5–5.1)
Sodium: 143 mmol/L (ref 135–145)
Total Bilirubin: 1 mg/dL (ref 0.0–1.2)
Total Protein: 4.7 g/dL — ABNORMAL LOW (ref 6.5–8.1)

## 2024-02-26 LAB — GLUCOSE, CAPILLARY
Glucose-Capillary: 100 mg/dL — ABNORMAL HIGH (ref 70–99)
Glucose-Capillary: 111 mg/dL — ABNORMAL HIGH (ref 70–99)
Glucose-Capillary: 123 mg/dL — ABNORMAL HIGH (ref 70–99)
Glucose-Capillary: 129 mg/dL — ABNORMAL HIGH (ref 70–99)
Glucose-Capillary: 154 mg/dL — ABNORMAL HIGH (ref 70–99)
Glucose-Capillary: 95 mg/dL (ref 70–99)

## 2024-02-26 LAB — PHOSPHORUS: Phosphorus: 2.1 mg/dL — ABNORMAL LOW (ref 2.5–4.6)

## 2024-02-26 LAB — MAGNESIUM: Magnesium: 2.4 mg/dL (ref 1.7–2.4)

## 2024-02-26 LAB — TRIGLYCERIDES: Triglycerides: 193 mg/dL — ABNORMAL HIGH (ref <150)

## 2024-02-26 LAB — PREALBUMIN: Prealbumin: 6 mg/dL — ABNORMAL LOW (ref 18–38)

## 2024-02-26 MED ORDER — MORPHINE SULFATE 1 MG/ML IV SOLN PCA: INTRAVENOUS | Status: DC

## 2024-02-26 MED ORDER — ENOXAPARIN SODIUM 40 MG/0.4ML IJ SOSY
40.0000 mg | PREFILLED_SYRINGE | INTRAMUSCULAR | Status: DC
  Administered 2024-02-26 – 2024-03-07 (×10): 40 mg via SUBCUTANEOUS
  Filled 2024-02-26 (×10): qty 0.4

## 2024-02-26 MED ORDER — SODIUM PHOSPHATES 45 MMOLE/15ML IV SOLN
15.0000 mmol | Freq: Once | INTRAVENOUS | Status: AC
  Administered 2024-02-26: 15 mmol via INTRAVENOUS
  Filled 2024-02-26: qty 5

## 2024-02-26 MED ORDER — POTASSIUM CHLORIDE 10 MEQ/100ML IV SOLN
10.0000 meq | INTRAVENOUS | Status: AC
  Administered 2024-02-26 (×4): 10 meq via INTRAVENOUS
  Filled 2024-02-26 (×3): qty 100

## 2024-02-26 MED ORDER — DIPHENHYDRAMINE HCL 50 MG/ML IJ SOLN: 12.5000 mg | Freq: Four times a day (QID) | INTRAMUSCULAR | Status: DC | PRN

## 2024-02-26 MED ORDER — ONDANSETRON HCL 4 MG/2ML IJ SOLN: 4.0000 mg | Freq: Four times a day (QID) | INTRAMUSCULAR | Status: DC | PRN

## 2024-02-26 MED ORDER — BISACODYL 10 MG RE SUPP
10.0000 mg | Freq: Once | RECTAL | Status: AC
  Administered 2024-02-26: 10 mg via RECTAL
  Filled 2024-02-26: qty 1

## 2024-02-26 MED ORDER — METOCLOPRAMIDE HCL 5 MG/ML IJ SOLN
10.0000 mg | Freq: Three times a day (TID) | INTRAMUSCULAR | Status: AC
  Administered 2024-02-26 (×2): 10 mg via INTRAVENOUS
  Filled 2024-02-26 (×2): qty 2

## 2024-02-26 MED ORDER — SODIUM CHLORIDE 0.9% FLUSH: 9.0000 mL | INTRAVENOUS | Status: DC | PRN

## 2024-02-26 MED ORDER — MORPHINE SULFATE 1 MG/ML IV SOLN PCA
INTRAVENOUS | Status: DC
  Filled 2024-02-26: qty 30

## 2024-02-26 MED ORDER — ACETAMINOPHEN 10 MG/ML IV SOLN
1000.0000 mg | Freq: Four times a day (QID) | INTRAVENOUS | Status: AC
  Administered 2024-02-26 – 2024-02-27 (×4): 1000 mg via INTRAVENOUS
  Filled 2024-02-26 (×4): qty 100

## 2024-02-26 MED ORDER — NALOXONE HCL 0.4 MG/ML IJ SOLN: 0.4000 mg | INTRAMUSCULAR | Status: DC | PRN

## 2024-02-26 MED ORDER — DIPHENHYDRAMINE HCL 12.5 MG/5ML PO ELIX: 12.5000 mg | ORAL_SOLUTION | Freq: Four times a day (QID) | ORAL | Status: DC | PRN

## 2024-02-26 NOTE — Progress Notes (Incomplete)
 PHARMACY - TOTAL PARENTERAL NUTRITION CONSULT NOTE   Indication: Prolonged ileus  Patient Measurements: Height: 5' 5.5 (166.4 cm) Weight: 81.6 kg (180 lb) IBW/kg (Calculated) : 58.15 TPN AdjBW (KG): 64 Body mass index is 29.5 kg/m. Usual Weight: 82.3 kg 12/23/2023  Assessment: 47 yof who presented to the ED with abdominal pain and nausea found to have a SBO on CT. PMH includes FVL, DVT, HLD, HTN, C-section, cholecystectomy, oophorectomy. Surgery consulted, NGT placed, and SB protocol completed on 10/10. After several days of conservative management, the SBO was not improving and she was taken to the OR on 10/13. She has not had a BM or flatulence since surgery and has not eaten since admission. Pharmacy consulted to begin TPN.   Glucose / Insulin: hx DM, A1c 5.8, BG <140 on sSSI (1u used in 24h) Electrolytes: Na 143, K 3.1 (40 IV given), CoCa 9.6 Renal: SCr 0.76 Hepatic: LFTs Intake / Output; MIVF: NGT output 700 mL, 340 mL UOP, D10 ar 20 ml/hr, NS at 125 ml/hr  GI Imaging: 10/10 CT chest/abd: high grade SBO 10/10 SB protocol: contrast in dilated loops SB 10/11 Abd XR: SBO with progressive dilation 10/12 Abd XR: slightly decreased but persistent gas-filled dilated SB 10/13 Abd XR: persistent and unchanged SBO  GI Surgeries / Procedures:  10/13 diagnostic lap, ex lap, repair SB enterotomy x2, mall bowel stricturoplasty  Central access: TBD TPN start date: 02/27/24  Nutritional Goals: Goal TPN rate is *** mL/hr (provides *** g of protein and *** kcals per day)  RD Assessment:    Current Nutrition:  NPO and TPN  Plan:  Start TPN at ***mL/hr at 1800 Electrolytes in TPN: Na 50mEq/L, K 25mEq/L, Ca 54mEq/L, Mg 77mEq/L, and Phos 15mmol/L. Cl:Ac 1:1 Add standard MVI and trace elements to TPN Initiate {SSI - Scale:23379} {SSI - Frequency:23380} SSI and adjust as needed  Reduce MIVF to *** mL/hr at 1800 Monitor TPN labs on Mon/Thurs, ***

## 2024-02-26 NOTE — Progress Notes (Signed)
 PHARMACY - TOTAL PARENTERAL NUTRITION CONSULT NOTE    Indication: Prolonged ileus   Patient Measurements: Height: 5' 5.5 (166.4 cm) Weight: 81.6 kg (180 lb) IBW/kg (Calculated) : 58.15 TPN AdjBW (KG): 64 Body mass index is 29.5 kg/m. Usual Weight: 82.3 kg 12/23/2023   Assessment: 57 yof who presented to the ED with abdominal pain and nausea found to have a SBO on CT. PMH includes FVL, DVT, HLD, HTN, C-section, cholecystectomy, oophorectomy. Surgery consulted, NGT placed, and SB protocol completed on 10/10. After several days of conservative management, the SBO was not improving and she was taken to the OR on 10/13. She has not had a BM or flatulence since surgery and has not eaten since admission. Pharmacy consulted to begin TPN.   Plan: TPN orders received after cutoff - will start 10/17 Labs ordered RD consulted Added on TG, Phos, Mg for today  Thank you for involving pharmacy in this patient's care.  Delon Sax, PharmD, BCPS Clinical Pharmacist Clinical phone for 02/26/2024 is [phone] 02/26/2024 3:12 PM

## 2024-02-26 NOTE — Progress Notes (Signed)
 Physical Therapy Treatment Patient Details Name: Kristine [name] MRN: [id number] DOB: Oct 13, 1955 Today's Date: 02/26/2024   History of Present Illness Pt is a 68 y.o. female presented to [facility] ED 02/19/24 with c/o mid abdominal pain, cramp-like. CT revealed high-grade mid small bowel obstruction within the right hemipelvis with proximal small bowel dilatation up to 3 cm and decompressed distal bowel and colon. Pt s/p exploratory laparotomy with repair of small bowel enterotomy x 2 and mall bowel stricturoplasty 10/13. PMHx: factor V Leiden, DVT, gastritis, HLD, and HTN.    PT Comments  Pt resting in bed on arrival, pleasant and agreeable to session and demonstrating good progress towards acute goals. Pt with good recall for abdominal precautions for comfort performing log roll technique during bed mobility and requiring light min A to elevate trunk to sitting. Pt demonstrating transfers sit<>stand and gait with RW support with grossly CGA for safety. Pt continues to be limited in safe mobility by decreased activity tolerance, poor balance/postural reactions with reliance on AD support in standing and pain. Pt continues to benefit from skilled PT services to progress toward functional mobility goals.     If plan is discharge home, recommend the following: A little help with walking and/or transfers;A little help with bathing/dressing/bathroom;Assistance with cooking/housework;Assist for transportation;Help with stairs or ramp for entrance   Can travel by private vehicle        Equipment Recommendations  BSC/3in1    Recommendations for Other Services       Precautions / Restrictions Precautions Precautions: Fall;Other (comment) (Abdominal) Recall of Precautions/Restrictions: Intact Precaution/Restrictions Comments: PCAP and NG Restrictions Weight Bearing Restrictions Per Provider Order: No     Mobility  Bed Mobility Overal bed mobility: Needs Assistance Bed Mobility: Rolling,  Sidelying to Sit Rolling: Contact guard assist, Used rails Sidelying to sit: Min assist, HOB elevated       General bed mobility comments: good recall for log roll technique, light assist to elevate trunk to sitting    Transfers Overall transfer level: Needs assistance Equipment used: Rolling walker (2 wheels) Transfers: Sit to/from Stand, Bed to chair/wheelchair/BSC Sit to Stand: Contact guard assist           General transfer comment: Cues for hand placement on rise, CGA for safety, slow rise with pt needing increased time to acheive upright standing due to pulling at abdoment    Ambulation/Gait Ambulation/Gait assistance: Contact guard assist Gait Distance (Feet): 120 Feet Assistive device: Rolling walker (2 wheels) Gait Pattern/deviations: Step-through pattern, Decreased step length - right, Decreased step length - left, Decreased stride length Gait velocity: decr     General Gait Details: Pt ambulated with short slow steps. She maintained body inside AD at all times. Slight fwd flex and limited foot clearence. Cues for safe navigation and line management.   Stairs             Wheelchair Mobility     Tilt Bed    Modified Rankin (Stroke Patients Only)       Balance Overall balance assessment: Needs assistance Sitting-balance support: Single extremity supported, Feet supported Sitting balance-Leahy Scale: Fair     Standing balance support: Bilateral upper extremity supported, During functional activity, Reliant on assistive device for balance Standing balance-Leahy Scale: Poor Standing balance comment: Reliant on RW for standing balance                            Communication Communication Communication: No apparent difficulties  Cognition Arousal: Alert Behavior During Therapy: WFL for tasks assessed/performed                             Following commands: Intact      Cueing Cueing Techniques: Verbal cues, Gestural  cues  Exercises      General Comments General comments (skin integrity, edema, etc.): VSS on RA      Pertinent Vitals/Pain Pain Assessment Pain Assessment: Faces Faces Pain Scale: Hurts a little bit Pain Location: Abdomen Pain Descriptors / Indicators: Grimacing, Guarding, Discomfort, Operative site guarding Pain Intervention(s): Premedicated before session, Monitored during session, Limited activity within patient's tolerance    Home Living                          Prior Function            PT Goals (current goals can now be found in the care plan section) Acute Rehab PT Goals PT Goal Formulation: With patient Time For Goal Achievement: 03/09/24 Progress towards PT goals: Progressing toward goals    Frequency    Min 3X/week      PT Plan      Co-evaluation              AM-PAC PT 6 Clicks Mobility   Outcome Measure  Help needed turning from your back to your side while in a flat bed without using bedrails?: A Little Help needed moving from lying on your back to sitting on the side of a flat bed without using bedrails?: A Little Help needed moving to and from a bed to a chair (including a wheelchair)?: A Little Help needed standing up from a chair using your arms (e.g., wheelchair or bedside chair)?: A Little Help needed to walk in hospital room?: A Little Help needed climbing 3-5 steps with a railing? : Total 6 Click Score: 16    End of Session   Activity Tolerance: Patient tolerated treatment well Patient left: Other (comment);with call bell/phone within reach (on [facility]) Nurse Communication: Mobility status PT Visit Diagnosis: Muscle weakness (generalized) (M62.81);Difficulty in walking, not elsewhere classified (R26.2);Other abnormalities of gait and mobility (R26.89);Unsteadiness on feet (R26.81)     Time: 8899-8870 PT Time Calculation (min) (ACUTE ONLY): 29 min  Charges:    $Gait Training: 8-22 mins $Therapeutic Activity: 8-22  mins PT General Charges $$ ACUTE PT VISIT: 1 Visit                     Jenkins Risdon R. PTA Acute Rehabilitation Services Office: [phone]   [name] 02/26/2024, 1:01 PM

## 2024-02-26 NOTE — Progress Notes (Signed)
 PROGRESS NOTE  Kristine [name]  DOB: 28-May-1955  PCP: [name] LABOR, MD FMW:[id number]  DOA: 02/19/2024  LOS: 6 days  Hospital Day: 8  Subjective: Patient was seen and examined this morning. Sitting up at the edge of the bed.   Looks tired.  Pain partially controlled.  Has not passed gas or had a bowel movement yet. Afebrile, hemodynamically stable, Labs from this morning with potassium low at 3.1.  Renal function better to normal.  But only 340 mL of urine output documented in last 24 hours.  Patient states she passed some 150 mL urine this morning. Albumin low at 2.1  Brief narrative: Kristine [name] is a 68 y.o. female with PMH significant for HTN, HLD, factor V Leiden, DVT currently not on anticoagulation, GERD, anxiety/depression,; PSH including cesarean section, cholecystectomy, tubal ligation, right oophorectomy. 10/9, patient presented to the ED with complaint of cramping abdominal pain for several hours associated with nausea. CT angio of the chest, abdomen and pelvis revealed high-grade mid small bowel obstruction within the right hemipelvis with proximal small bowel dilatation up to 3 cm and decompressed distal bowel and colon.    General Surgery was consulted Admitted to [facility] Started on conservative management for bowel obstruction with bowel rest, NG suction, IV hydration. However, did not have significant improvement despite 72 hours of conservative management. 10/13, underwent ex lap, repair of small bowel enterotomy, small bowel stricturoplasty.  Assessment and plan: Small bowel obstruction S/p ex lap, stricturoplasty -1013 Dr. Vernetta Presented with small bowel obstruction in the setting of multiple prior abdominal surgeries.   SBO did not resolve with several days of conservative management and ultimately required surgery Currently in postop ileus, remains n.p.o. Pain partially controlled.  Has not passed gas or had a bowel movement yet. NG tube suction  remains. General Surgery following.  Noted a plan of tentatively starting TPN. Continue IV hydration. Pain regimen --- Scheduled: Acetaminophen IV 1 g every 6 hours, morphine PCA --- PRN:   Hypokalemia/hypomagnesemia Potassium level low at 3.1 today.  IV replacement ordered.   Magnesium  was replaced yesterday.  Repeat labs tomorrow. Obtain phosphorus as well.   Recent Labs  Lab 02/21/24 0750 02/22/24 0259 02/24/24 0425 02/25/24 0451 02/26/24 0512  K 4.7 3.7 4.3 3.6 3.1*  MG  --  1.9  --  1.6*  --   PHOS  --  3.2  --  3.4  --    AKI Noted postop rise in creatinine. With IV hydration, creatinine is better.  Only 340 mL of urine output documented last 24 hours.  Patient states he passed another 150 mL urine this morning.  Still inadequate.  Continue IV fluid for next 24 hours. Continue to monitor  Recent Labs    09/30/23 1027 02/19/24 1950 02/21/24 0750 02/22/24 0259 02/24/24 0425 02/25/24 0451 02/26/24 0512  BUN 17 15 10 12 20  25* 19  CREATININE 0.72 0.93 0.70 0.66 1.22* 1.05* 0.76  CO2 27 27 23 25  21* 21* 21*   Hypernatremia Improved with hydration. Continue to monitor Recent Labs  Lab 02/19/24 1950 02/21/24 0750 02/22/24 0259 02/24/24 0425 02/25/24 0451 02/26/24 0512  NA 142 141 142 146* 146* 143   Prediabetes Hypoglycemic episode A1c 5.8 on 12/23/2023 Patient denies history of diabetes. States she was taking metformin  1000 mg daily for weight loss. Currently metformin  is on hold Currently on SSI/Accu-Cheks. 10/15, blood sugar level was running low and hence I started her on a second IV fluid with D10 at  20 mL/h.  Blood sugar level better since then.  Continue to monitor Recent Labs  Lab 02/25/24 1957 02/25/24 2351 02/26/24 0359 02/26/24 0856 02/26/24 1108  GLUCAP 103* 123* 95 100* 123*   Hypertension H/o PVCs PTA meds- Toprol  25 mg daily, Lasix  PRN Currently on IV metoprolol  5 mg every 8 hours scheduled. Would advise telemetry monitoring given  history of PVCs but patient would not like them on. If electrolytes like potassium and magnesium  are low, she is agreeable to putting telemetry on  Continue to monitor electrolytes with postop ileus   H/o factor V Leiden and prior DVTs Was on anticoagulation for 1 year at some point  Since then, not on chronic anticoagulation  Continue DVT prophylaxis with subcutaneous heparin.  HLD PTA meds- aspirin 81 mg daily, Zocor  10 mg daily Currently both on hold  GERD Continue PPI IV  Anxiety/depression PTA meds- bupropion  300 mg daily Resume once oral intake ensured   Mobility: PT eval obtained.  Encourage ambulation PT Orders:   PT Follow up Rec: Acute Inpatient Rehab (3hours/Day)02/24/2024 1400   Goals of care   Code Status: Full Code     DVT prophylaxis:  enoxaparin (LOVENOX) injection 40 mg Start: 02/26/24 1200 Place and maintain sequential compression device Start: 02/24/24 0043   Antimicrobials: Perioperative antibiotics Fluid: NS at 125 mL/h to continue another 24 hours. Consultants: General Surgery Family Communication: Family not at bedside today  Status: Inpatient Level of care:  Telemetry Medical.  But patient does not want to have telemetry monitor on yet  Patient is from: Home Needs to continue in-hospital care: Currently postop ileus Anticipated d/c to: Pending clinical course  Diet:  Diet Order             Diet NPO time specified Except for: Ice Chips  Diet effective now                   Scheduled Meds:  Chlorhexidine Gluconate Cloth  6 each Topical Daily   enoxaparin (LOVENOX) injection  40 mg Subcutaneous Q24H   fluticasone   2 spray Each Nare Daily   insulin aspart  0-9 Units Subcutaneous Q4H   methocarbamol (ROBAXIN) injection  500 mg Intravenous Q8H   metoprolol  tartrate  5 mg Intravenous Q8H   morphine   Intravenous Q4H   pantoprazole (PROTONIX) IV  40 mg Intravenous QAC breakfast   sodium chloride  flush  10-40 mL Intracatheter Q12H     PRN meds: diphenhydrAMINE **OR** diphenhydrAMINE, naloxone **AND** sodium chloride  flush, ondansetron (ZOFRAN) IV, prochlorperazine, sodium chloride  flush   Infusions:   sodium chloride  125 mL/hr at 02/26/24 0828   dextrose 20 mL/hr at 02/25/24 1706   potassium chloride       Antimicrobials: Anti-infectives (From admission, onward)    Start     Dose/Rate Route Frequency Ordered Stop   02/23/24 1400  ceFAZolin (ANCEF) IVPB 1 g/50 mL premix  Status:  Discontinued        1 g 100 mL/hr over 30 Minutes Intravenous Every 8 hours 02/23/24 1222 02/25/24 1016       Objective: Vitals:   02/26/24 0752 02/26/24 0856  BP: 120/66   Pulse: 99   Resp: 17 19  Temp: 98.2 F (36.8 C)   SpO2: 95% 96%    Intake/Output Summary (Last 24 hours) at 02/26/2024 1141 Last data filed at 02/26/2024 0906 Gross per 24 hour  Intake 2071.01 ml  Output 790 ml  Net 1281.01 ml   Filed Weights   02/19/24  1931 02/23/24 1337  Weight: 81.6 kg 81.6 kg   Weight change:  Body mass index is 29.5 kg/m.   Physical Exam: General exam: Pleasant, elderly Caucasian female.  Partially controlled pain Skin: No rashes, lesions or ulcers. HEENT: Atraumatic, normocephalic, no obvious bleeding Lungs: Clear to auscultation bilaterally,  CVS: S1, S2, no murmur,   GI/Abd: Mild appropriate postop tenderness, nondistended, bowel sound mild CNS: Alert, awake, oriented x 3 Psychiatry: Mood appropriate Extremities: No pedal edema, no calf tenderness,   Data Review: I have personally reviewed the laboratory data and studies available.  F/u labs ordered Unresulted Labs (From admission, onward)     Start     Ordered   02/27/24 0500  Basic metabolic panel with GFR  Tomorrow morning,   R        02/26/24 1004   02/27/24 0500  CBC with Differential/Platelet  Tomorrow morning,   R        02/26/24 1004   02/27/24 0500  Magnesium   Tomorrow morning,   R        02/26/24 1004   02/27/24 0500  Phosphorus  Tomorrow  morning,   R        02/26/24 1004            Signed, Chapman Rota, MD [facility] 02/26/2024

## 2024-02-26 NOTE — Progress Notes (Signed)
 Progress Note  3 Days Post-Op  Subjective: Patient reports that she is having some pain. She is trying not to use morphine. Feels that when she is using pain medications, that her pain is manageable. Has not had BM or flatulence. Having some nausea. Denies vomiting.   ROS  All negative with the exception of above.   Objective: Vital signs in last 24 hours: Temp:  [97.1 F (36.2 C)-98.2 F (36.8 C)] 98.2 F (36.8 C) (10/16 0752) Pulse Rate:  [76-99] 99 (10/16 0752) Resp:  [15-20] 19 (10/16 1154) BP: (120-138)/(56-71) 120/66 (10/16 0752) SpO2:  [95 %-99 %] 98 % (10/16 1154) FiO2 (%):  [0 %-80 %] 0 % (10/16 0856) Last BM Date :  (PTA)  Intake/Output from previous day: 10/15 0701 - 10/16 0700 In: 2061 [I.V.:1791; NG/GT:120; IV Piggyback:150] Out: 1040 [Urine:340; Emesis/NG output:700] Intake/Output this shift: Total I/O In: 70 [I.V.:10; NG/GT:60] Out: -   PE: General: Pleasant female who is walking with PT.  HEENT: Head is normocephalic, atraumatic. NGT in place and is clamped. Heart: Normal rate during encounter. Lungs: Respiratory effort nonlabored. Abd: Soft with mild distention. Generalized tenderness to palpation. No rebound tenderness or guarding. Midline incision covered with dressing that is clean and dry.  Skin: Warm and dry Psych: A&Ox3 with an appropriate affect.    Lab Results:  Recent Labs    02/25/24 0451 02/26/24 0512  WBC 9.6 8.8  HGB 13.4 12.3  HCT 42.5 39.2  PLT 118* 120*   BMET Recent Labs    02/25/24 0451 02/26/24 0512  NA 146* 143  K 3.6 3.1*  CL 114* 112*  CO2 21* 21*  GLUCOSE 94 111*  BUN 25* 19  CREATININE 1.05* 0.76  CALCIUM 8.0* 8.1*   PT/INR No results for input(s): LABPROT, INR in the last 72 hours. CMP     Component Value Date/Time   NA 143 02/26/2024 0512   NA 143 01/08/2018 1605   K 3.1 (L) 02/26/2024 0512   CL 112 (H) 02/26/2024 0512   CO2 21 (L) 02/26/2024 0512   GLUCOSE 111 (H) 02/26/2024 0512   BUN 19  02/26/2024 0512   BUN 12 01/08/2018 1605   CREATININE 0.76 02/26/2024 0512   CALCIUM 8.1 (L) 02/26/2024 0512   PROT 4.7 (L) 02/26/2024 0512   ALBUMIN 2.1 (L) 02/26/2024 0512   AST 13 (L) 02/26/2024 0512   ALT 14 02/26/2024 0512   ALKPHOS 64 02/26/2024 0512   BILITOT 1.0 02/26/2024 0512   GFRNONAA >60 02/26/2024 0512   GFRAA 107 01/08/2018 1605   Lipase     Component Value Date/Time   LIPASE 33 02/19/2024 1950       Studies/Results: No results found.  Anti-infectives: Anti-infectives (From admission, onward)    Start     Dose/Rate Route Frequency Ordered Stop   02/23/24 1400  ceFAZolin (ANCEF) IVPB 1 g/50 mL premix  Status:  Discontinued        1 g 100 mL/hr over 30 Minutes Intravenous Every 8 hours 02/23/24 1222 02/25/24 1016        Assessment/Plan POD3 S/P Diagnostic laparoscopy, Exploratory laparotomy, Repair of small bowel enterotomy x 2, Mall bowel stricturoplasty by Dr. Vicenta Poli on 02/23/2024 - Afebrile. VSS, No leukocytosis  - Foley removed 10/15. - Still awaiting return of bowel function. Continue NGT to LIWS. - Have asked pharmacy to consult to initiate TPN. - Mobilize as tolerated   LOS: 6 days   I reviewed specialist notes, hospitalist notes, last  24 h vitals and pain scores, last 48 h intake and output, last 24 h labs and trends, and last 24 h imaging results.   Marjorie Carlyon Favre, [facility] 02/26/2024, 12:25 PM Please see Amion for pager number during day hours 7:00am-4:30pm

## 2024-02-27 ENCOUNTER — Other Ambulatory Visit: Payer: Self-pay

## 2024-02-27 DIAGNOSIS — E440 Moderate protein-calorie malnutrition: Secondary | ICD-10-CM | POA: Insufficient documentation

## 2024-02-27 DIAGNOSIS — K56609 Unspecified intestinal obstruction, unspecified as to partial versus complete obstruction: Secondary | ICD-10-CM | POA: Diagnosis not present

## 2024-02-27 LAB — COMPREHENSIVE METABOLIC PANEL WITH GFR
ALT: 13 U/L (ref 0–44)
AST: 15 U/L (ref 15–41)
Albumin: 1.9 g/dL — ABNORMAL LOW (ref 3.5–5.0)
Alkaline Phosphatase: 71 U/L (ref 38–126)
Anion gap: 10 (ref 5–15)
BUN: 14 mg/dL (ref 8–23)
CO2: 21 mmol/L — ABNORMAL LOW (ref 22–32)
Calcium: 8.1 mg/dL — ABNORMAL LOW (ref 8.9–10.3)
Chloride: 113 mmol/L — ABNORMAL HIGH (ref 98–111)
Creatinine, Ser: 0.58 mg/dL (ref 0.44–1.00)
GFR, Estimated: >60 mL/min (ref >60)
Glucose, Bld: 139 mg/dL — ABNORMAL HIGH (ref 70–99)
Potassium: 3.5 mmol/L (ref 3.5–5.1)
Sodium: 144 mmol/L (ref 135–145)
Total Bilirubin: 0.7 mg/dL (ref 0.0–1.2)
Total Protein: 4.5 g/dL — ABNORMAL LOW (ref 6.5–8.1)

## 2024-02-27 LAB — CBC WITH DIFFERENTIAL/PLATELET
Abs Immature Granulocytes: 0.06 K/uL (ref 0.00–0.07)
Basophils Absolute: 0 K/uL (ref 0.0–0.1)
Basophils Relative: 0 %
Eosinophils Absolute: 0.2 K/uL (ref 0.0–0.5)
Eosinophils Relative: 2 %
HCT: 34.5 % — ABNORMAL LOW (ref 36.0–46.0)
Hemoglobin: 11.2 g/dL — ABNORMAL LOW (ref 12.0–15.0)
Immature Granulocytes: 1 %
Lymphocytes Relative: 10 %
Lymphs Abs: 0.9 K/uL (ref 0.7–4.0)
MCH: 28.4 pg (ref 26.0–34.0)
MCHC: 32.5 g/dL (ref 30.0–36.0)
MCV: 87.6 fL (ref 80.0–100.0)
Monocytes Absolute: 0.6 K/uL (ref 0.1–1.0)
Monocytes Relative: 8 %
Neutro Abs: 6.6 K/uL (ref 1.7–7.7)
Neutrophils Relative %: 79 %
Platelets: 149 K/uL — ABNORMAL LOW (ref 150–400)
RBC: 3.94 MIL/uL (ref 3.87–5.11)
RDW: 13.7 % (ref 11.5–15.5)
WBC: 8.4 K/uL (ref 4.0–10.5)
nRBC: 0 % (ref 0.0–0.2)

## 2024-02-27 LAB — PHOSPHORUS: Phosphorus: 2.6 mg/dL (ref 2.5–4.6)

## 2024-02-27 LAB — GLUCOSE, CAPILLARY
Glucose-Capillary: 106 mg/dL — ABNORMAL HIGH (ref 70–99)
Glucose-Capillary: 117 mg/dL — ABNORMAL HIGH (ref 70–99)
Glucose-Capillary: 124 mg/dL — ABNORMAL HIGH (ref 70–99)
Glucose-Capillary: 130 mg/dL — ABNORMAL HIGH (ref 70–99)
Glucose-Capillary: 141 mg/dL — ABNORMAL HIGH (ref 70–99)
Glucose-Capillary: 146 mg/dL — ABNORMAL HIGH (ref 70–99)

## 2024-02-27 LAB — MAGNESIUM: Magnesium: 1.9 mg/dL (ref 1.7–2.4)

## 2024-02-27 MED ORDER — TRAVASOL 10 % IV SOLN: INTRAVENOUS | Status: DC

## 2024-02-27 MED ORDER — METHOCARBAMOL 1000 MG/10ML IJ SOLN
250.0000 mg | Freq: Three times a day (TID) | INTRAMUSCULAR | Status: DC
  Administered 2024-02-27 – 2024-03-01 (×8): 250 mg via INTRAVENOUS
  Filled 2024-02-27 (×8): qty 10

## 2024-02-27 MED ORDER — THIAMINE HCL 100 MG/ML IJ SOLN
100.0000 mg | Freq: Every day | INTRAMUSCULAR | Status: AC
  Administered 2024-02-27 – 2024-03-02 (×5): 100 mg via INTRAVENOUS
  Filled 2024-02-27 (×5): qty 2

## 2024-02-27 MED ORDER — SODIUM CHLORIDE 0.9% FLUSH: 10.0000 mL | INTRAVENOUS | Status: DC | PRN

## 2024-02-27 MED ORDER — POTASSIUM PHOSPHATES 15 MMOLE/5ML IV SOLN
15.0000 mmol | Freq: Once | INTRAVENOUS | Status: AC
  Administered 2024-02-27: 15 mmol via INTRAVENOUS
  Filled 2024-02-27: qty 5

## 2024-02-27 MED ORDER — TRAVASOL 10 % IV SOLN
INTRAVENOUS | Status: AC
  Filled 2024-02-27: qty 451.2

## 2024-02-27 MED ORDER — SODIUM CHLORIDE 0.9 % IV SOLN: INTRAVENOUS | Status: AC

## 2024-02-27 MED ORDER — POTASSIUM CHLORIDE 10 MEQ/100ML IV SOLN
10.0000 meq | INTRAVENOUS | Status: AC
  Administered 2024-02-27 (×4): 10 meq via INTRAVENOUS
  Filled 2024-02-27 (×3): qty 100

## 2024-02-27 MED ORDER — SODIUM CHLORIDE 0.9 % IV SOLN: INTRAVENOUS | Status: DC

## 2024-02-27 MED ORDER — MAGNESIUM SULFATE 2 GM/50ML IV SOLN
2.0000 g | Freq: Once | INTRAVENOUS | Status: AC
  Administered 2024-02-27: 2 g via INTRAVENOUS
  Filled 2024-02-27: qty 50

## 2024-02-27 MED ORDER — HYDROMORPHONE HCL 1 MG/ML IJ SOLN
0.5000 mg | INTRAMUSCULAR | Status: DC | PRN
  Administered 2024-02-28 – 2024-02-29 (×3): 0.5 mg via INTRAVENOUS
  Filled 2024-02-27 (×4): qty 0.5

## 2024-02-27 NOTE — Progress Notes (Signed)
 PROGRESS NOTE  Kristine [name]  DOB: 1955/07/20  PCP: [name] LABOR, MD FMW:[id number]  DOA: 02/19/2024  LOS: 7 days  Hospital Day: 9  Subjective: Patient was seen and examined this morning. Sitting up in recliner.  Feels better. Passing gas.  Had 3 bowel movements in the last 24 hours. In the last 24 hours, afebrile, heart rate in 60s to 90s, blood pressure elevated to 170s this morning Labs this morning with renal function better, potassium, magnesium , phosphorus, sodium normal.  WC count not elevated. Started on TPN by surgery  Brief narrative: ROSALAND SHIFFMAN is a 68 y.o. female with PMH significant for HTN, HLD, factor V Leiden, DVT currently not on anticoagulation, GERD, anxiety/depression,; PSH including cesarean section, cholecystectomy, tubal ligation, right oophorectomy. 10/9, patient presented to the ED with complaint of cramping abdominal pain for several hours associated with nausea. CT angio of the chest, abdomen and pelvis revealed high-grade mid small bowel obstruction within the right hemipelvis with proximal small bowel dilatation up to 3 cm and decompressed distal bowel and colon.    General Surgery was consulted Admitted to [facility] Started on conservative management for bowel obstruction with bowel rest, NG suction, IV hydration. However, did not have significant improvement despite 72 hours of conservative management. 10/13, underwent ex lap, repair of small bowel enterotomy, small bowel stricturoplasty.  Assessment and plan: Small bowel obstruction S/p ex lap, stricturoplasty -1013 Dr. Vernetta Presented with small bowel obstruction in the setting of multiple prior abdominal surgeries.   SBO did not resolve with several days of conservative management and ultimately required surgery Postop ileus seems to be improving. Had 3 BM in last 24 hours. NG tube remains in suction.  Defer to general surgery for NG clamping and oral intake trial  Pain better  controlled patient requested morphine PCA to be discontinued. Noted a plan of tentatively starting TPN. Once starting TPN, I will reduce normal saline rate.  Hypokalemia/hypomagnesemia Electrolyte levels being monitored regularly. Normal levels today. Recent Labs  Lab 02/22/24 0259 02/24/24 0425 02/25/24 0451 02/26/24 0511 02/26/24 0512 02/27/24 0655  K 3.7 4.3 3.6  --  3.1* 3.5  MG 1.9  --  1.6* 2.4  --  1.9  PHOS 3.2  --  3.4 2.1*  --  2.6   AKI Noted postop rise in creatinine. With IV hydration, creatinine is better. Continue IV fluid for next 24 hours. Continue to monitor  Recent Labs    09/30/23 1027 02/19/24 1950 02/21/24 0750 02/22/24 0259 02/24/24 0425 02/25/24 0451 02/26/24 0512 02/27/24 0655  BUN 17 15 10 12 20  25* 19 14  CREATININE 0.72 0.93 0.70 0.66 1.22* 1.05* 0.76 0.58  CO2 27 27 23 25  21* 21* 21* 21*   Hypernatremia Improved with hydration. Continue to monitor Recent Labs  Lab 02/21/24 0750 02/22/24 0259 02/24/24 0425 02/25/24 0451 02/26/24 0512 02/27/24 0655  NA 141 142 146* 146* 143 144   Prediabetes Hypoglycemic episode A1c 5.8 on 12/23/2023 Patient denies history of diabetes. States she was taking metformin  1000 mg daily for weight loss. Currently metformin  is on hold Currently on SSI/Accu-Cheks. 10/15, blood sugar level was running low and hence I started her on a second IV fluid with D10 at 20 mL/h.  Can stop it since TPN has been started. Blood sugar level better since then.  Continue to monitor Recent Labs  Lab 02/26/24 2014 02/26/24 2334 02/27/24 0305 02/27/24 0814 02/27/24 1118  GLUCAP 154* 129* 130* 124* 117*   Hypertension H/o  PVCs PTA meds- Toprol  25 mg daily, Lasix  PRN Currently on IV metoprolol  5 mg every 8 hours scheduled.   H/o factor V Leiden and prior DVTs Was on anticoagulation for 1 year at some point  Since then, not on chronic anticoagulation  Continue DVT prophylaxis with subcutaneous  heparin.  HLD PTA meds- aspirin 81 mg daily, Zocor  10 mg daily Currently both on hold  GERD Continue PPI IV  Anxiety/depression PTA meds- bupropion  300 mg daily Resume once oral intake ensured   Mobility: PT eval obtained.  Encourage ambulation PT Orders:   PT Follow up Rec: Acute Inpatient Rehab (3hours/Day)02/26/2024 1258   Goals of care   Code Status: Full Code     DVT prophylaxis:  enoxaparin (LOVENOX) injection 40 mg Start: 02/26/24 1200 Place and maintain sequential compression device Start: 02/24/24 0043   Antimicrobials: Perioperative antibiotics Fluid: NS at reduced rate today Consultants: General Surgery Family Communication: Husband at bedside today  Status: Inpatient Level of care:  Telemetry Medical.    Patient is from: Home Needs to continue in-hospital care: Improving postop ileus Anticipated d/c to: Pending clinical course  Diet:  Diet Order             Diet clear liquid Room service appropriate? Yes; Fluid consistency: Thin  Diet effective now                   Scheduled Meds:  Chlorhexidine Gluconate Cloth  6 each Topical Daily   enoxaparin (LOVENOX) injection  40 mg Subcutaneous Q24H   fluticasone   2 spray Each Nare Daily   insulin aspart  0-9 Units Subcutaneous Q4H   methocarbamol (ROBAXIN) injection  500 mg Intravenous Q8H   metoprolol  tartrate  5 mg Intravenous Q8H   pantoprazole (PROTONIX) IV  40 mg Intravenous QAC breakfast   sodium chloride  flush  10-40 mL Intracatheter Q12H   thiamine (VITAMIN B1) injection  100 mg Intravenous Daily    PRN meds: HYDROmorphone (DILAUDID) injection, prochlorperazine, sodium chloride  flush, sodium chloride  flush   Infusions:   sodium chloride  125 mL/hr at 02/27/24 1404   Followed by   sodium chloride      acetaminophen 1,000 mg (02/27/24 0520)   potassium PHOSPHATE IVPB (in mmol)     TPN ADULT (ION)      Antimicrobials: Anti-infectives (From admission, onward)    Start      Dose/Rate Route Frequency Ordered Stop   02/23/24 1400  ceFAZolin (ANCEF) IVPB 1 g/50 mL premix  Status:  Discontinued        1 g 100 mL/hr over 30 Minutes Intravenous Every 8 hours 02/23/24 1222 02/25/24 1016       Objective: Vitals:   02/27/24 0907 02/27/24 1137  BP:    Pulse:    Resp: 16 16  Temp:    SpO2:      Intake/Output Summary (Last 24 hours) at 02/27/2024 1516 Last data filed at 02/27/2024 1300 Gross per 24 hour  Intake 1445.38 ml  Output 605 ml  Net 840.38 ml   Filed Weights   02/19/24 1931 02/23/24 1337  Weight: 81.6 kg 81.6 kg   Weight change:  Body mass index is 29.5 kg/m.   Physical Exam: General exam: Pleasant, elderly Caucasian female. Partially controlled pain Skin: No rashes, lesions or ulcers. HEENT: Atraumatic, normocephalic, no obvious bleeding Lungs: Clear to auscultation bilaterally,  CVS: S1, S2, no murmur,   GI/Abd: Mild appropriate postop tenderness, nondistended, bowel sound present CNS: Alert, awake, oriented x 3 Psychiatry:  Better mood today Extremities: No pedal edema, no calf tenderness,   Data Review: I have personally reviewed the laboratory data and studies available.  F/u labs ordered Unresulted Labs (From admission, onward)     Start     Ordered   03/01/24 0500  Comprehensive metabolic panel  (TPN Lab Panel)  Every Mon,Thu (0500),   R      02/26/24 1454   03/01/24 0500  Magnesium   (TPN Lab Panel)  Every Mon,Thu (0500),   R      02/26/24 1454   03/01/24 0500  Phosphorus  (TPN Lab Panel)  Every Mon,Thu (0500),   R      02/26/24 1454   03/01/24 0500  Triglycerides  (TPN Lab Panel)  Every Monday (0500),   R      02/26/24 1454   02/28/24 0500  Renal function panel  Tomorrow morning,   R        02/27/24 0757   02/28/24 0500  Magnesium   Tomorrow morning,   R        02/27/24 0757            Signed, Chapman Rota, MD [facility] 02/27/2024

## 2024-02-27 NOTE — Progress Notes (Signed)
 Progress Note  4 Days Post-Op  Subjective: Patient states that she only used PCA once yesterday. She feels that it is not needed and asks that it be discontinued. Will adjust orders.  She has had 3 bowel movements that are described as brown with mucus. Having flatulence. Patient has some nausea. Denies vomiting.  ROS  All negative with the exception of above.   Objective: Vital signs in last 24 hours: Temp:  [97.6 F (36.4 C)-98.3 F (36.8 C)] 98 F (36.7 C) (10/17 0815) Pulse Rate:  [69-93] 78 (10/17 0815) Resp:  [16-20] 16 (10/17 0907) BP: (133-178)/(62-84) 178/84 (10/17 0815) SpO2:  [95 %-99 %] 97 % (10/17 0815) Last BM Date : 02/27/24  Intake/Output from previous day: 10/16 0701 - 10/17 0700 In: 4641.7 [I.V.:3914.4; NG/GT:180; IV Piggyback:547.3] Out: 755 [Urine:275; Emesis/NG output:480] Intake/Output this shift: Total I/O In: 60 [NG/GT:60] Out: -   PE: General: Pleasant female who is resting in NAD. HEENT: Head is normocephalic, atraumatic. NGT in place. Heart: Normal rate during encounter. Lungs: Respiratory effort nonlabored. Abd: Soft with mild distention. Generalized tenderness to palpation. No rebound tenderness or guarding. Midline incision covered with dressing that is clean and dry.  Skin: Warm and dry Psych: A&Ox3 with an appropriate affect.    Lab Results:  Recent Labs    02/26/24 0512 02/27/24 0655  WBC 8.8 8.4  HGB 12.3 11.2*  HCT 39.2 34.5*  PLT 120* 149*   BMET Recent Labs    02/26/24 0512 02/27/24 0655  NA 143 144  K 3.1* 3.5  CL 112* 113*  CO2 21* 21*  GLUCOSE 111* 139*  BUN 19 14  CREATININE 0.76 0.58  CALCIUM 8.1* 8.1*   PT/INR No results for input(s): LABPROT, INR in the last 72 hours. CMP     Component Value Date/Time   NA 144 02/27/2024 0655   NA 143 01/08/2018 1605   K 3.5 02/27/2024 0655   CL 113 (H) 02/27/2024 0655   CO2 21 (L) 02/27/2024 0655   GLUCOSE 139 (H) 02/27/2024 0655   BUN 14 02/27/2024 0655    BUN 12 01/08/2018 1605   CREATININE 0.58 02/27/2024 0655   CALCIUM 8.1 (L) 02/27/2024 0655   PROT 4.5 (L) 02/27/2024 0655   ALBUMIN 1.9 (L) 02/27/2024 0655   AST 15 02/27/2024 0655   ALT 13 02/27/2024 0655   ALKPHOS 71 02/27/2024 0655   BILITOT 0.7 02/27/2024 0655   GFRNONAA >60 02/27/2024 0655   GFRAA 107 01/08/2018 1605   Lipase     Component Value Date/Time   LIPASE 33 02/19/2024 1950       Studies/Results: US  EKG SITE RITE Result Date: 02/27/2024 If Site Rite image not attached, placement could not be confirmed due to current cardiac rhythm.   Anti-infectives: Anti-infectives (From admission, onward)    Start     Dose/Rate Route Frequency Ordered Stop   02/23/24 1400  ceFAZolin (ANCEF) IVPB 1 g/50 mL premix  Status:  Discontinued        1 g 100 mL/hr over 30 Minutes Intravenous Every 8 hours 02/23/24 1222 02/25/24 1016        Assessment/Plan POD4 S/P Diagnostic laparoscopy, Exploratory laparotomy, Repair of small bowel enterotomy x 2, Mall bowel stricturoplasty by Dr. Vicenta Poli on 02/23/2024 - Afebrile. Elevated BP. - Foley removed 10/15. - Had 3 small bowel movements with mucus and flatulence after suppository and reglan initiated. Some nausea. No vomiting. - Continue NGT to LIWS. Will discuss diet with MD. -  Per patient request, will discontinue PCA. Feels that her pain is manageable. - Have asked pharmacy to consult to initiate TPN. - Mobilize as tolerated.   LOS: 7 days   I reviewed nursing notes, last 24 h vitals and pain scores, last 48 h intake and output, last 24 h labs and trends, and last 24 h imaging results.   Marjorie Carlyon Favre, [facility] 02/27/2024, 10:30 AM Please see Amion for pager number during day hours 7:00am-4:30pm

## 2024-02-27 NOTE — Progress Notes (Signed)
 PHARMACY - TOTAL PARENTERAL NUTRITION CONSULT NOTE   Indication: Prolonged ileus  Patient Measurements: Height: 5' 5.5 (166.4 cm) Weight: 81.6 kg (180 lb) IBW/kg (Calculated) : 58.15 TPN AdjBW (KG): 64 Body mass index is 29.5 kg/m. Usual Weight: 82.3 kg   Assessment: 33 yof who presented to the ED with abdominal pain and nausea found to have a SBO on CT. PMH includes FVL, DVT, HLD, HTN, C-section, cholecystectomy, oophorectomy. Surgery consulted, NGT placed, and SB protocol completed on 10/10. After several days of conservative management, the SBO was not improving and she was taken to the OR on 10/13. She has not had a BM or flatulence since surgery and has not eaten since admission. Pharmacy consulted to begin TPN.   Glucose / Insulin: DM, HgbA1C 5.8; CBG's 100-154; 5 units of iSS given over past 24 hours Electrolytes: Na 144, K 3.5, Cl 113, CO2 21, Ca 8.1 [CoCa 9.78], Phos 2.9, Mag 1.9 Renal: BUN 14 / scr 0.58 Hepatic: Alk Phos 71, AST/ALT 15/13, Tbili 0.7, Alb 1.9, TG 193 Intake / Output; MIVF: UOP 0.14 ml/kg/hr + 4 unmeasured, NG 480 ml, Stool 3+ unmeasured GI Imaging: 10/13 DG abd portable 1V shows persistent unchanged SBO GI Surgeries / Procedures: 10/13 ExLap  Central access:  TPN start date: 10/17  Nutritional Goals: Goal TPN rate is 80 mL/hr (provides 90 g of protein and 2000 kcals per day)  RD Assessment: Estimated Needs Total Energy Estimated Needs: 1632-2040 kcal Total Protein Estimated Needs: 81-98 grams Total Fluid Estimated Needs: 1.6-2.0L/d  Current Nutrition:  NPO and TPN  Plan:  Start TPN at 40 mL/hr at 1800 Electrolytes in TPN: Na 90 mEq/L, K 80 mEq/L, Ca 8 mEq/L, Mg 10 mEq/L, and Phos 20 mmol/L. Cl:Ac maximize acetate Add standard MVI and trace elements to TPN Start thiamine 100 mg iv qday x5 Magnesium  2 g iv x1 K runs IV x4 K Phos 15 mmol IV x1 Initiate Sensitive q4h SSI and adjust as needed  Reduce MIVF to 85 mL/hr at 1800 Monitor TPN labs on  Mon/Thurs, prn  Girtha Kilgore BS, PharmD, BCPS Clinical Pharmacist 02/27/2024 7:09 AM  Contact: [phone] after 3 PM

## 2024-02-27 NOTE — Progress Notes (Signed)
 OT Cancellation Note  Patient Details Name: KARIANA WILES MRN: [id number] DOB: 1955/10/18   Cancelled Treatment:    Reason Eval/Treat Not Completed: Other (comment) Sterile procedure in progress in Pt room. OT to follow-up as appropriate and as schedule allows.   Maurilio CROME, OTR/LSABRA  [facility] Acute Rehabilitation  Office: [phone]   Maurilio PARAS Hagar Sadiq 02/27/2024, 2:35 PM

## 2024-02-27 NOTE — Progress Notes (Signed)
 Peripherally Inserted Central Catheter Placement  The IV Nurse has discussed with the patient and/or persons authorized to consent for the patient, the purpose of this procedure and the potential benefits and risks involved with this procedure.  The benefits include less needle sticks, lab draws from the catheter, and the patient may be discharged home with the catheter. Risks include, but not limited to, infection, bleeding, blood clot (thrombus formation), and puncture of an artery; nerve damage and irregular heartbeat and possibility to perform a PICC exchange if needed/ordered by physician.  Alternatives to this procedure were also discussed.  Bard Power PICC patient education guide, fact sheet on infection prevention and patient information card has been provided to patient /or left at bedside.    PICC Placement Documentation  PICC Double Lumen 02/27/24 Right Basilic 39 cm 1 cm (Active)  Indication for Insertion or Continuance of Line Administration of hyperosmolar/irritating solutions (i.e. TPN, Vancomycin, etc.) 02/27/24 1437  Exposed Catheter (cm) 1 cm 02/27/24 1437  Site Assessment Clean, Dry, Intact 02/27/24 1437  Lumen #1 Status Flushed;Saline locked;Blood return noted 02/27/24 1437  Lumen #2 Status Flushed;Saline locked;Blood return noted 02/27/24 1437  Dressing Type Transparent;Securing device 02/27/24 1437  Dressing Status Antimicrobial disc/dressing in place;Clean, Dry, Intact 02/27/24 1437  Line Care Connections checked and tightened 02/27/24 1437  Line Adjustment (NICU/IV Team Only) No 02/27/24 1437  Dressing Change Due 03/05/24 02/27/24 1437       Renaee Notice Albarece 02/27/2024, 2:38 PM

## 2024-02-27 NOTE — Progress Notes (Signed)
 Initial Nutrition Assessment  DOCUMENTATION CODES:   Non-severe (moderate) malnutrition in context of acute illness/injury  INTERVENTION:  Initiate TPN via PICC, managed by Pharmacy  High risk of refeeding. Recommend 100 mg thiamine IV for 5-7 days with start of TPN. Monitor K+, Phos and Mg x 3 days. Replete outside TPN as needed.    NUTRITION DIAGNOSIS:   Moderate Malnutrition related to acute illness (SBO/post op ileus) as evidenced by energy intake < or equal to 50% for > or equal to 5 days, mild muscle depletion.  GOAL:   Patient will meet greater than or equal to 90% of their needs   MONITOR:   Other (Comment), Labs, PO intake (TPN)  REASON FOR ASSESSMENT:   Consult New TPN/TNA  ASSESSMENT:   PMH significant for HTN, HLD, factor V Leiden, DVT currently not on anticoagulation, GERD, anxiety/depression,; PSH including cesarean section, cholecystectomy, tubal ligation, right oophorectomy. Presented to the ED with complaint of cramping abdominal pain for several hours associated with nausea.  10/13 - S/p ex lap, stricturoplasty   Patient seen in room, currently NPO with NGT to LIWS. Pharmacy messaged regarding new start TPN to today due to prolonged NPO status r/t post op ileus. PICC line ordered. Pt has been NPO since admission (day 8), her last meal was a hamburger @ 2 pm last Thursday. She reports feeling fine after eating until about 2 1/2 hours later when she started experiencing sharp abd pain. Reports that for 2-3 weeks PTA, she would experience fullness after eating meals and needed to start eating smaller portions. Pt reports intentional weight loss starting at the beginning of the year (previous weight 196 lbs), but felt that she may have started to experience increased unintended weight loss form recent GI issues. Reports weight of 180 lbs prior to admission, bedscale weight take during visit however not updated in chart due to significant weight difference ( + 25 lbs)  from admission weight and unsure of accuracy. Pt reports that she tried [place] ice (mango and lemon) over th past 24 hrs and felt that they were too sweet, suggest trying frozen unsweet tea. No BM this admission.   Meds: SSI 0-9 units q 4 hrs, protonix, thiamine, NS @ 125 ml/hr, D10 @ 20 ml/hr   Labs: Cl 113, Glu 139, Calcium 8.1 (corrected 9.78)  Lines/Drains:  NGT 16 Fr   UOP: 275 ml  NGT output: 480 ml    NUTRITION - FOCUSED PHYSICAL EXAM:  Flowsheet Row Most Recent Value  Orbital Region No depletion  Upper Arm Region No depletion  Thoracic and Lumbar Region No depletion  Buccal Region No depletion  Temple Region Mild depletion  Clavicle Bone Region Mild depletion  Clavicle and Acromion Bone Region No depletion  Scapular Bone Region No depletion  Dorsal Hand No depletion  Patellar Region No depletion  Anterior Thigh Region No depletion  Posterior Calf Region No depletion  Hair Reviewed  Eyes Reviewed  Mouth Reviewed  Skin Reviewed  Nails Reviewed    Diet Order:   Diet Order             Diet NPO time specified Except for: Ice Chips  Diet effective now                   EDUCATION NEEDS:   Education needs have been addressed  Skin:  Skin Assessment: Skin Integrity Issues: Skin Integrity Issues:: Incisions Incisions: abdomen  Last BM:  10/17 type 6  Height:   Ht  Readings from Last 1 Encounters:  02/23/24 5' 5.5 (1.664 m)    Weight:   Wt Readings from Last 1 Encounters:  02/23/24 81.6 kg    Ideal Body Weight:  56.8 kg  BMI:  Body mass index is 29.5 kg/m.  Estimated Nutritional Needs:   Kcal:  8367-7959 kcal  Protein:  81-98 grams  Fluid:  1.6-2.0L/d  Madalyn Potters, MS, RD, LDN Clinical Dietitian  Contact via secure chat. If unavailable, use group chat RD Inpatient.

## 2024-02-27 NOTE — Progress Notes (Signed)
   Inpatient Rehabilitation Admissions Coordinator   I will place order for full assessment of candidacy for possible CIR admit.  Heron Leavell, RN, MSN Rehab Admissions Coordinator [phone] 02/27/2024 6:00 PM

## 2024-02-27 NOTE — Plan of Care (Signed)
  Problem: Clinical Measurements: Goal: Will remain free from infection Outcome: Not Progressing   Problem: Clinical Measurements: Goal: Diagnostic test results will improve Outcome: Not Progressing   Problem: Nutrition: Goal: Adequate nutrition will be maintained Outcome: Not Progressing   Problem: Elimination: Goal: Will not experience complications related to bowel motility Outcome: Not Progressing   Problem: Safety: Goal: Ability to remain free from injury will improve Outcome: Not Progressing   Problem: Metabolic: Goal: Ability to maintain appropriate glucose levels will improve Outcome: Not Progressing

## 2024-02-27 NOTE — Progress Notes (Signed)
 MD DC the pt's PCA pump, Morphine 13ml was discarded with Shanon RN.

## 2024-02-27 NOTE — Progress Notes (Signed)
 Occupational Therapy Treatment Patient Details Name: Kristine [name] MRN: [id number] DOB: 02/11/1956 Today's Date: 02/27/2024   History of present illness Pt is a 68 y.o. female presented to [facility] ED 02/19/24 with c/o mid abdominal pain, cramp-like. CT revealed high-grade mid small bowel obstruction within the right hemipelvis with proximal small bowel dilatation up to 3 cm and decompressed distal bowel and colon. Pt s/p exploratory laparotomy with repair of small bowel enterotomy x 2 and mall bowel stricturoplasty 10/13. PMHx: factor V Leiden, DVT, gastritis, HLD, and HTN.   OT comments  Pt is progressing well towards established goals. Focus of session on progressing functional mobility, increasing activity tolerance, and improving independence with ADL tasks. Pt tolerated ~15 minutes of seated grooming routine. Educated on importance of energy conservation during occupational engagement. Pt continues to require up to Max A for LB ADL tasks. Pt with improved toileting clothing manipulation and hygiene this date. Pt required CGA for functional mobility. Continues to be limited by decreased activity tolerance/endurance, pain, generalized weakness, and abdominal precautions. OT to continue per POC to facilitate progress towards goals. Patient will benefit from intensive inpatient follow-up therapy, >3 hours/day.        If plan is discharge home, recommend the following:  A little help with walking and/or transfers;A little help with bathing/dressing/bathroom;Assistance with cooking/housework;Assistance with feeding;Assist for transportation;Help with stairs or ramp for entrance   Equipment Recommendations  None recommended by OT    Recommendations for Other Services      Precautions / Restrictions Precautions Precautions: Fall;Other (comment) (abdominal) Recall of Precautions/Restrictions: Intact Precaution/Restrictions Comments: PICC Restrictions Weight Bearing Restrictions Per Provider  Order: No       Mobility Bed Mobility               General bed mobility comments: Pt greeted on BSC and transferred to sit EOB and then to recliner. Bed mobility not assessed this date.    Transfers Overall transfer level: Needs assistance Equipment used: Rolling walker (2 wheels) Transfers: Sit to/from Stand, Bed to chair/wheelchair/BSC Sit to Stand: Contact guard assist     Step pivot transfers: Contact guard assist     General transfer comment: Cues for hand placement on rise. CGA for safety and requiring increased time to extend trunk.     Balance Overall balance assessment: Needs assistance Sitting-balance support: No upper extremity supported, Feet supported Sitting balance-Leahy Scale: Good Sitting balance - Comments: Pt sat EOB for grooming tasks for ~15 minutes. Pt able to maintain static and dynamic sitting balance in BSC and EOB without back support. Occassional propping on one UE d/t fatigue.   Standing balance support: Bilateral upper extremity supported, During functional activity, Reliant on assistive device for balance Standing balance-Leahy Scale: Poor Standing balance comment: Reliant on RW for standing balance                           ADL either performed or assessed with clinical judgement   ADL Overall ADL's : Needs assistance/impaired Eating/Feeding: Set up;Sitting   Grooming: Wash/dry hands;Wash/dry face;Oral care;Brushing hair;Set up;Sitting       Lower Body Bathing: Moderate assistance;Sitting/lateral leans Lower Body Bathing Details (indicate cue type and reason): Sit to stand for LB bathing with RW Upper Body Dressing : Minimal assistance;Sitting   Lower Body Dressing: Maximal assistance;Sit to/from stand   Toilet Transfer: Contact guard assist;Ambulation;BSC/3in1   Toileting- Clothing Manipulation and Hygiene: Contact guard assist;Sitting/lateral lean  Functional mobility during ADLs: Contact guard  assist;Rolling walker (2 wheels) General ADL Comments: Pt completed grooming routine seated EOB with set-up A. Continues Max A LB tasks.    Extremity/Trunk Assessment Upper Extremity Assessment Upper Extremity Assessment: Generalized weakness   Lower Extremity Assessment Lower Extremity Assessment: Generalized weakness        Vision   Vision Assessment?: No apparent visual deficits   Perception     Praxis     Communication Communication Communication: No apparent difficulties   Cognition Arousal: Alert Behavior During Therapy: WFL for tasks assessed/performed Cognition: No apparent impairments                               Following commands: Intact        Cueing   Cueing Techniques: Verbal cues, Visual cues  Exercises      Shoulder Instructions       General Comments Pt with recent removal of NGT, and placement of PICC on RUE. Pt educated on energy conservation for ADLs.    Pertinent Vitals/ Pain       Pain Assessment Pain Assessment: Faces Faces Pain Scale: Hurts a little bit Pain Location: Abdomen Pain Descriptors / Indicators: Grimacing, Guarding Pain Intervention(s): Monitored during session, Limited activity within patient's tolerance  Home Living                                          Prior Functioning/Environment              Frequency  Min 2X/week (2-3x/wk goal, attempt CIR)        Progress Toward Goals  OT Goals(current goals can now be found in the care plan section)  Progress towards OT goals: Progressing toward goals  Acute Rehab OT Goals Patient Stated Goal: To eat OT Goal Formulation: With patient Time For Goal Achievement: 03/10/24 Potential to Achieve Goals: Good ADL Goals Pt Will Perform Grooming: with set-up;standing Pt Will Perform Lower Body Bathing: with contact guard assist;sitting/lateral leans Pt Will Perform Lower Body Dressing: with contact guard assist;sitting/lateral leans Pt  Will Transfer to Toilet: with contact guard assist;ambulating Additional ADL Goal #1: Pt will engage in 10 minute OOB ADL task with CGA to facilitate activity tolerance.  Plan      Co-evaluation                 AM-PAC OT 6 Clicks Daily Activity     Outcome Measure   Help from another person eating meals?: A Little Help from another person taking care of personal grooming?: A Little Help from another person toileting, which includes using toliet, bedpan, or urinal?: A Little Help from another person bathing (including washing, rinsing, drying)?: A Lot Help from another person to put on and taking off regular upper body clothing?: A Little Help from another person to put on and taking off regular lower body clothing?: A Lot 6 Click Score: 16    End of Session Equipment Utilized During Treatment: Rolling walker (2 wheels)  OT Visit Diagnosis: Unsteadiness on feet (R26.81);Muscle weakness (generalized) (M62.81);Pain Pain - part of body:  (abdomen)   Activity Tolerance Patient tolerated treatment well   Patient Left in chair;with call bell/phone within reach;with family/visitor present   Nurse Communication Mobility status;Other (comment) (Infusion complete)        Time: 8492-8461 OT  Time Calculation (min): 31 min  Charges: OT General Charges $OT Visit: 1 Visit OT Treatments $Self Care/Home Management : 23-37 mins  Maurilio CROME, OTR/L.  [facility] Acute Rehabilitation  Office: [phone]   Maurilio PARAS Rubens Cranston 02/27/2024, 3:57 PM

## 2024-02-28 ENCOUNTER — Encounter: Payer: Self-pay | Admitting: *Deleted

## 2024-02-28 DIAGNOSIS — K56609 Unspecified intestinal obstruction, unspecified as to partial versus complete obstruction: Secondary | ICD-10-CM | POA: Diagnosis not present

## 2024-02-28 LAB — RENAL FUNCTION PANEL
Albumin: 1.7 g/dL — ABNORMAL LOW (ref 3.5–5.0)
Anion gap: 8 (ref 5–15)
BUN: 11 mg/dL (ref 8–23)
CO2: 22 mmol/L (ref 22–32)
Calcium: 7.5 mg/dL — ABNORMAL LOW (ref 8.9–10.3)
Chloride: 113 mmol/L — ABNORMAL HIGH (ref 98–111)
Creatinine, Ser: 0.52 mg/dL (ref 0.44–1.00)
GFR, Estimated: >60 mL/min (ref >60)
Glucose, Bld: 183 mg/dL — ABNORMAL HIGH (ref 70–99)
Phosphorus: 2.5 mg/dL (ref 2.5–4.6)
Potassium: 3.4 mmol/L — ABNORMAL LOW (ref 3.5–5.1)
Sodium: 143 mmol/L (ref 135–145)

## 2024-02-28 LAB — MAGNESIUM: Magnesium: 1.9 mg/dL (ref 1.7–2.4)

## 2024-02-28 LAB — GLUCOSE, CAPILLARY
Glucose-Capillary: 170 mg/dL — ABNORMAL HIGH (ref 70–99)
Glucose-Capillary: 171 mg/dL — ABNORMAL HIGH (ref 70–99)
Glucose-Capillary: 178 mg/dL — ABNORMAL HIGH (ref 70–99)
Glucose-Capillary: 178 mg/dL — ABNORMAL HIGH (ref 70–99)
Glucose-Capillary: 184 mg/dL — ABNORMAL HIGH (ref 70–99)
Glucose-Capillary: 192 mg/dL — ABNORMAL HIGH (ref 70–99)
Glucose-Capillary: 203 mg/dL — ABNORMAL HIGH (ref 70–99)

## 2024-02-28 MED ORDER — SODIUM CHLORIDE 0.9 % IV SOLN: INTRAVENOUS | Status: AC

## 2024-02-28 MED ORDER — ZINC OXIDE 40 % EX OINT: TOPICAL_OINTMENT | CUTANEOUS | Status: DC | PRN

## 2024-02-28 MED ORDER — POTASSIUM CHLORIDE 10 MEQ/100ML IV SOLN
10.0000 meq | INTRAVENOUS | Status: AC
  Administered 2024-02-28 (×4): 10 meq via INTRAVENOUS
  Filled 2024-02-28 (×4): qty 100

## 2024-02-28 MED ORDER — TRAVASOL 10 % IV SOLN
INTRAVENOUS | Status: AC
  Filled 2024-02-28: qty 676.8

## 2024-02-28 MED ORDER — HYDRALAZINE HCL 20 MG/ML IJ SOLN
10.0000 mg | Freq: Four times a day (QID) | INTRAMUSCULAR | Status: DC | PRN
  Administered 2024-02-28 – 2024-03-03 (×5): 10 mg via INTRAVENOUS
  Filled 2024-02-28 (×5): qty 1

## 2024-02-28 MED ORDER — ZINC OXIDE 40 % EX OINT
TOPICAL_OINTMENT | Freq: Four times a day (QID) | CUTANEOUS | Status: DC
  Filled 2024-02-28: qty 57

## 2024-02-28 MED ORDER — ZINC OXIDE 40 % EX OINT
TOPICAL_OINTMENT | CUTANEOUS | Status: DC | PRN
  Filled 2024-02-28: qty 57

## 2024-02-28 MED ORDER — MAGNESIUM SULFATE IN D5W 1-5 GM/100ML-% IV SOLN
1.0000 g | Freq: Once | INTRAVENOUS | Status: AC
  Administered 2024-02-28: 1 g via INTRAVENOUS
  Filled 2024-02-28: qty 100

## 2024-02-28 MED ORDER — HYDROCORTISONE (PERIANAL) 2.5 % EX CREA
TOPICAL_CREAM | Freq: Three times a day (TID) | CUTANEOUS | Status: DC
Start: 2024-02-28 — End: 2024-03-03
  Filled 2024-02-28: qty 28.35

## 2024-02-28 MED ORDER — POTASSIUM PHOSPHATES 15 MMOLE/5ML IV SOLN
15.0000 mmol | Freq: Once | INTRAVENOUS | Status: AC
  Administered 2024-02-28: 15 mmol via INTRAVENOUS
  Filled 2024-02-28: qty 5

## 2024-02-28 MED ORDER — BISMUTH SUBSALICYLATE 262 MG/15ML PO SUSP
30.0000 mL | Freq: Two times a day (BID) | ORAL | Status: AC
  Administered 2024-02-28 (×2): 30 mL via ORAL
  Filled 2024-02-28 (×2): qty 236

## 2024-02-28 MED ORDER — SODIUM CHLORIDE 0.9 % IV SOLN: INTRAVENOUS | Status: DC

## 2024-02-28 NOTE — Progress Notes (Signed)
    Assessment & Plan: POD#5 - S/P Diagnostic laparoscopy, Exploratory laparotomy, Repair of small bowel enterotomy x 2, small bowel stricturoplasty by Dr. Vicenta Poli on 02/23/2024 - having profuse liquid diarrhea - tolerating clear liquid diet - will advance to full liquid diet (may help diarrhea) - OK to give Kaopectate (avoid Lomotil, Immodium) - Desitin ointment for perineal skin  Will follow.  Resolving post op ileus.          Krystal Spinner, MD [facility] practice Office: [phone]        Chief Complaint: SBO  Subjective: Patient up at bedside, complains of diarrhea.  Tolerating clear liquid diet.  Minimal abd pain.  Objective: Vital signs in last 24 hours: Temp:  [97.6 F (36.4 C)-98.3 F (36.8 C)] 98.3 F (36.8 C) (10/18 0810) Pulse Rate:  [78-91] 91 (10/18 0810) Resp:  [16-18] 16 (10/18 0810) BP: (149-167)/(74-95) 167/85 (10/18 0810) SpO2:  [94 %-100 %] 94 % (10/18 0810) Last BM Date : 02/28/24  Intake/Output from previous day: 10/17 0701 - 10/18 0700 In: 1803 [I.V.:1088; NG/GT:60; IV Piggyback:655] Out: -  Intake/Output this shift: No intake/output data recorded.  Physical Exam: HEENT - sclerae clear, mucous membranes moist Abdomen - soft, minimal tenderness; wounds dry and intact  Lab Results:  Recent Labs    02/26/24 0512 02/27/24 0655  WBC 8.8 8.4  HGB 12.3 11.2*  HCT 39.2 34.5*  PLT 120* 149*   BMET Recent Labs    02/27/24 0655 02/28/24 0617  NA 144 143  K 3.5 3.4*  CL 113* 113*  CO2 21* 22  GLUCOSE 139* 183*  BUN 14 11  CREATININE 0.58 0.52  CALCIUM 8.1* 7.5*   PT/INR No results for input(s): LABPROT, INR in the last 72 hours. Comprehensive Metabolic Panel:    Component Value Date/Time   NA 143 02/28/2024 0617   NA 144 02/27/2024 0655   NA 143 01/08/2018 1605   K 3.4 (L) 02/28/2024 0617   K 3.5 02/27/2024 0655   CL 113 (H) 02/28/2024 0617   CL 113 (H) 02/27/2024 0655   CO2 22  02/28/2024 0617   CO2 21 (L) 02/27/2024 0655   BUN 11 02/28/2024 0617   BUN 14 02/27/2024 0655   BUN 12 01/08/2018 1605   CREATININE 0.52 02/28/2024 0617   CREATININE 0.58 02/27/2024 0655   GLUCOSE 183 (H) 02/28/2024 0617   GLUCOSE 139 (H) 02/27/2024 0655   CALCIUM 7.5 (L) 02/28/2024 0617   CALCIUM 8.1 (L) 02/27/2024 0655   AST 15 02/27/2024 0655   AST 13 (L) 02/26/2024 0512   ALT 13 02/27/2024 0655   ALT 14 02/26/2024 0512   ALKPHOS 71 02/27/2024 0655   ALKPHOS 64 02/26/2024 0512   BILITOT 0.7 02/27/2024 0655   BILITOT 1.0 02/26/2024 0512   PROT 4.5 (L) 02/27/2024 0655   PROT 4.7 (L) 02/26/2024 0512   ALBUMIN 1.7 (L) 02/28/2024 0617   ALBUMIN 1.9 (L) 02/27/2024 0655    Studies/Results: US  EKG SITE RITE Result Date: 02/27/2024 If Site Rite image not attached, placement could not be confirmed due to current cardiac rhythm.     Krystal Spinner 02/28/2024  Patient ID: Kristine [name], female   DOB: 07-Jul-1955, 68 y.o.   MRN: [id number]

## 2024-02-28 NOTE — Plan of Care (Signed)
  Problem: Education: Goal: Knowledge of General Education information will improve Description: Including pain rating scale, medication(s)/side effects and non-pharmacologic comfort measures Outcome: Progressing   Problem: Activity: Goal: Risk for activity intolerance will decrease Outcome: Progressing   Problem: Elimination: Goal: Will not experience complications related to bowel motility Outcome: Progressing Goal: Will not experience complications related to urinary retention Outcome: Progressing   Problem: Pain Managment: Goal: General experience of comfort will improve and/or be controlled Outcome: Progressing

## 2024-02-28 NOTE — PMR Pre-admission (Signed)
 PMR Admission Coordinator Pre-Admission Assessment  Patient: Kristine [name] is an 68 y.o., female MRN: [id number] DOB: [birth year] Height: 5' 5.5 (166.4 cm) Weight: 81.6 kg  Insurance Information HMO:     PPO:      PCP:      IPA:      80/20:      OTHER:  PRIMARY: Medicare A and B      Policy#: [id number]      Subscriber: self CM Name:        Phone#:       Fax#:   Pre-Cert#:        Employer:  Works PRN, retired engineer, civil (consulting) Benefits:  Phone #:      Name: Checked in passport one source [employer]. Date: 08/12/19     Deduct: $1676      Out of Pocket Max: none      Life Max: n/a CIR: 100%      SNF: 100 days Outpatient: 80%     Co-Pay: 20% Home Health: 100%      Co-Pay: none DME: 80%     Co-Pay: 20% Providers: patient's choice  SECONDARY: BCBS supplement      Policy#: [id number]     Phone#: [phone]  Financial Counselor:       Phone#:   The "Data Collection Information Summary" for patients in Inpatient Rehabilitation Facilities with attached "Privacy Act Statement-Health Care Records" was provided and verbally reviewed with: Patient  Emergency Contact Information Contact Information     Name Relation Home Work [place] [phone]  [phone]      Other Contacts   None on File     Current Medical History  Patient Admitting Diagnosis: Debility, SBO, Exp lap  History of Present Illness: Pt. Is a 68 y.o. female with past medical history of factor V Leiden, DVT, gastritis, HLD, and HTN who presented to [facility] ED 02/19/24 with c/o mid abdominal pain, cramp-like. CT revealed high-grade mid small bowel obstruction within the right hemipelvis with proximal small bowel dilatation up to 3 cm and decompressed distal bowel and colon. Pt s/p exploratory laparotomy with repair of small bowel enterotomy x 2 and small bowel stricturoplasty on 02/23/24. Her postoperative course was complicated by a stubborn ileus requiring TPN. Follow-up CT 10/19 noted small bowel dilatation  up to 4 cm with thick and angulated segments and a thin walled pelvic fluid collection between the uterus and bladder consistent with a postoperative seroma as well as generalized anasarca of the body wall. The patient underwent successful CT-guided left transgluteal pelvic abscess drain placement in IR 03/05/24.  PT/OT evaluations completed with recommendations for inpatient rehab admission.  Patient's medical record from [facility] has been reviewed by the rehabilitation admission coordinator and physician.  Past Medical History  Past Medical History:  Diagnosis Date   Anxiety    Arthritis 2025   Toe/knee   Basal cell carcinoma    face   Cataract 2019   Clotting disorder 2009   Factor 5   Depression 1989   DVT (deep venous thrombosis) (HCC) 05/13/2006   Esophageal erosions    Factor V Leiden    On coag x 1 year ending in 09   Gastritis 05/13/1988   hospital- abd pain   GERD (gastroesophageal reflux disease)    Hx of adenomatous polyp of colon 12/06/2022   5 mm rectal adenoma repeat colonoscopy 2031    Hyperlipidemia    Hypertension    Radial head fracture 05/13/2009  Has the patient had major surgery during 100 days prior to admission? Yes  Family History   family history includes Anxiety disorder in her maternal aunt; Asthma in her daughter; Atrial fibrillation in her brother; Breast cancer in her sister; COPD in her maternal uncle; Cancer in her brother and sister; Colon polyps in her brother; Coronary artery disease in her brother; Depression in her mother; Diabetes in her brother, father, paternal uncle, paternal uncle, and paternal uncle; Graves' disease in her mother; Hearing loss in her mother; Heart attack in her brother and father; Heart disease in her brother, brother, father, maternal uncle, maternal uncle, paternal grandmother, paternal uncle, paternal uncle, paternal uncle, paternal uncle, and paternal uncle; Hypertension in her brother, brother, brother,  father, maternal aunt, maternal aunt, maternal grandfather, maternal grandmother, maternal uncle, mother, paternal grandfather, paternal grandmother, paternal uncle, paternal uncle, paternal uncle, paternal uncle, paternal uncle, sister, and sister; Kidney disease in her paternal grandfather; Obesity in her brother and paternal grandfather; Osteoporosis in her mother; Stroke in her maternal aunt, maternal aunt, maternal grandfather, maternal grandmother, and mother; Varicose Veins in her mother and sister; Vision loss in her father and mother.  Current Medications  Current Facility-Administered Medications:    acetaminophen (TYLENOL) tablet 1,000 mg, 1,000 mg, Oral, TID, Simaan, Elizabeth S, PA-C, 1,000 mg at 03/07/24 9047   ALPRAZolam  (XANAX ) tablet 0.25 mg, 0.25 mg, Oral, QHS, Danton Purchase T, MD, 0.25 mg at 03/06/24 2153   buPROPion  (WELLBUTRIN  XL) 24 hr tablet 300 mg, 300 mg, Oral, Daily, Dahal, Binaya, MD, 300 mg at 03/07/24 0952   carvedilol (COREG) tablet 25 mg, 25 mg, Oral, BID WC, Danton Purchase DASEN, MD, 25 mg at 03/07/24 9047   Chlorhexidine Gluconate Cloth 2 % PADS 6 each, 6 each, Topical, Daily, Vernetta Berg, MD, 6 each at 03/06/24 1339   diphenoxylate-atropine (LOMOTIL) 2.5-0.025 MG per tablet 2 tablet, 2 tablet, Oral, Q8H PRN, Eletha Boas, MD, 2 tablet at 03/06/24 0956   enoxaparin (LOVENOX) injection 40 mg, 40 mg, Subcutaneous, Q24H, Dahal, Binaya, MD, 40 mg at 03/06/24 1334   fluticasone  (FLONASE ) 50 MCG/ACT nasal spray 2 spray, 2 spray, Each Nare, Daily PRN, Dahal, Chapman, MD, 2 spray at 03/05/24 0929   furosemide  (LASIX ) tablet 20 mg, 20 mg, Oral, Daily, Danton Purchase T, MD, 20 mg at 03/07/24 9047   HYDROmorphone (DILAUDID) injection 0.5 mg, 0.5 mg, Intravenous, Q2H PRN, Simaan, Elizabeth S, PA-C, 0.5 mg at 02/29/24 1822   liver oil-zinc oxide (DESITIN) 40 % ointment, , Topical, PRN, Eletha Boas, MD   liver oil-zinc oxide (DESITIN) 40 % ointment, , Topical, QID, Dahal,  Binaya, MD, Given at 03/06/24 2222   methocarbamol (ROBAXIN) tablet 500 mg, 500 mg, Oral, Q8H PRN, Simaan, Elizabeth S, PA-C   pantoprazole (PROTONIX) EC tablet 40 mg, 40 mg, Oral, Daily, Chen, Lydia D, RPH, 40 mg at 03/07/24 9047   piperacillin-tazobactam (ZOSYN) IVPB 3.375 g, 3.375 g, Intravenous, Q8H, Dahal, Binaya, MD, Last Rate: 12.5 mL/hr at 03/07/24 0406, 3.375 g at 03/07/24 0406   [START ON 03/08/2024] potassium chloride  SA (KLOR-CON  M) CR tablet 20 mEq, 20 mEq, Oral, Daily, McClung, Purchase DASEN, MD   potassium chloride  SA (KLOR-CON  M) CR tablet 40 mEq, 40 mEq, Oral, Once, Danton Purchase T, MD   prochlorperazine (COMPAZINE) injection 10 mg, 10 mg, Intravenous, Q6H PRN, Vernetta Berg, MD, 10 mg at 03/04/24 0539   psyllium (HYDROCIL/METAMUCIL) 1 packet, 1 packet, Oral, BID, Eletha Boas, MD, 1 packet at 03/07/24 0952   sodium  chloride flush (NS) 0.9 % injection 10-40 mL, 10-40 mL, Intracatheter, Q12H, Vernetta Berg, MD, 10 mL at 03/06/24 2155   sodium chloride  flush (NS) 0.9 % injection 10-40 mL, 10-40 mL, Intracatheter, PRN, Dahal, Binaya, MD   sodium chloride  flush (NS) 0.9 % injection 5 mL, 5 mL, Intracatheter, Q8H, Shick, Michael, MD, 5 mL at 03/07/24 0410   traMADol (ULTRAM) tablet 100 mg, 100 mg, Oral, Q12H PRN, Simaan, Elizabeth S, PA-C   traMADol (ULTRAM) tablet 50 mg, 50 mg, Oral, Q6H PRN, Simaan, Elizabeth S, PA-C, 50 mg at 03/03/24 0417  Patients Current Diet:  Diet Order             DIET SOFT Room service appropriate? Yes; Fluid consistency: Thin  Diet effective now                   Precautions / Restrictions Precautions Precautions: Fall, Other (comment) (abdominal) Precaution/Restrictions Comments: PICC; TPN Restrictions Weight Bearing Restrictions Per Provider Order: No   Has the patient had 2 or more falls or a fall with injury in the past year? No  Prior Activity Level Community (5-7x/wk): Went out daily, was driving, works prn in [employer], is a  retired [employer]ER.  Prior Functional Level Self Care: Did the patient need help bathing, dressing, using the toilet or eating? Independent  Indoor Mobility: Did the patient need assistance with walking from room to room (with or without device)? Independent  Stairs: Did the patient need assistance with internal or external stairs (with or without device)? Independent  Functional Cognition: Did the patient need help planning regular tasks such as shopping or remembering to take medications? Independent  Patient Information Are you of Hispanic, Latino/a,or Spanish origin?: A. No, not of Hispanic, Latino/a, or Spanish origin What is your race?: A. White Do you need or want an interpreter to communicate with a doctor or health care staff?: 0. No Patient information obtained via proxy : No  Patient's Response To:  Health Literacy and Transportation Is the patient able to respond to health literacy and transportation needs?: Yes Health Literacy - How often do you need to have someone help you when you read instructions, pamphlets, or other written material from your doctor or pharmacy?: Never In the past 12 months, has lack of transportation kept you from medical appointments or from getting medications?: No In the past 12 months, has lack of transportation kept you from meetings, work, or from getting things needed for daily living?: No Higher Education Careers Adviser obtained via proxy: No  Journalist, Newspaper / Equipment Home Equipment: Agricultural Consultant (2 wheels), Rollator (4 wheels), [employer] - single point, Shower seat, Crutches, Hand held shower head  Prior Device Use: Indicate devices/aids used by the patient prior to current illness, exacerbation or injury? None of the above  Current Functional Level Cognition  Orientation Level: Oriented X4    Extremity Assessment (includes Sensation/Coordination)  Upper Extremity Assessment: Generalized weakness  Lower Extremity Assessment:  Generalized weakness    ADLs  Overall ADL's : Needs assistance/impaired Eating/Feeding: Set up, Sitting Eating/Feeding Details (indicate cue type and reason): self-feeding ice chips with set-up Grooming: Wash/dry hands, Oral care, Contact guard assist, Standing Grooming Details (indicate cue type and reason): Completed grooming tasks while standing at sink with CGA. Noted leaning against counter for support. Upper Body Bathing: Minimal assistance, Sitting Upper Body Bathing Details (indicate cue type and reason): Assist needed to bathe back Lower Body Bathing: Moderate assistance, Sitting/lateral leans Lower Body Bathing Details (indicate  cue type and reason): LB bathing in recliner. Able to figure four LLE, increased assist needed for RLE. Slight lateral leans utilized to bathe posterior side of hips Upper Body Dressing : Minimal assistance, Sitting Lower Body Dressing: Maximal assistance Lower Body Dressing Details (indicate cue type and reason): Max A don/doff socks while seated in recliner d/t abdominal pain Toilet Transfer: Contact guard assist, Ambulation, Comfort height toilet Toilet Transfer Details (indicate cue type and reason): Pt furniture walked with CGA to toilet Toileting- Clothing Manipulation and Hygiene: Contact guard assist, Sitting/lateral lean Toileting - Clothing Manipulation Details (indicate cue type and reason): not observed, pt. still using the b.room at end of session, but spouse is present and they both report he assists prn Functional mobility during ADLs: Contact guard assist, Rolling walker (2 wheels) General ADL Comments: Pt with improved engagement in ADL tasks this date, completing ADL routine in bathroom.    Mobility  Overal bed mobility: Needs Assistance Bed Mobility: Rolling, Sidelying to Sit Rolling: Contact guard assist, Used rails Sidelying to sit: Min assist, HOB elevated General bed mobility comments: Pt greeted in recliner and returned to  recliner.    Transfers  Overall transfer level: Needs assistance Equipment used: Rolling walker (2 wheels), Rollator (4 wheels) Transfers: Sit to/from Stand Sit to Stand: Contact guard assist Bed to/from chair/wheelchair/BSC transfer type:: Stand pivot Step pivot transfers: Contact guard assist General transfer comment: Pt required CGA for safety and brief steadying assist. Pt utilized RW and rollator during session, expressing preference for RW as she felt more stability as compared to rollator. Pt engaged in functional ambulation around entire unit to facilitate OOB activity tolerance. Pt initiated recovery breaks throughout activity and therapist encouraged Pt to engage in deep breathing strategies to manage abdominal pain. Therapist monitored Pt response to tx tasks and adjusted activity demands as appropriate.    Ambulation / Gait / Stairs / Wheelchair Mobility  Ambulation/Gait Ambulation/Gait assistance: Editor, Commissioning (Feet): 150 Feet Assistive device: 1 person hand held assist, IV Pole, None Gait Pattern/deviations: Step-through pattern, Decreased step length - right, Decreased step length - left, Decreased stride length General Gait Details: Pt ambulated with short slow steps with IV pole support on L and HHA on R, trial of ~20' without UE support with increased instability with ot quick to fatigue Gait velocity: decr    Posture / Balance Dynamic Sitting Balance Sitting balance - Comments: Pt sat on toilet with no back support. Balance Overall balance assessment: Needs assistance Sitting-balance support: No upper extremity supported Sitting balance-Leahy Scale: Good Sitting balance - Comments: Pt sat on toilet with no back support. Standing balance support: Bilateral upper extremity supported, During functional activity, Reliant on assistive device for balance Standing balance-Leahy Scale: Poor Standing balance comment: Reliant on BUE support    Special  considerations/life events  Skin Dressing in place over abdominal post op incision and Special service needs BID wet to dry dressing changes, IR needs to follow for possible drain removal   Previous Home Environment (from acute therapy documentation) Living Arrangements: Spouse/significant other Available Help at Discharge: Family, Available 24 hours/day Type of Home: House Home Layout: Multi-level, Able to live on main level with bedroom/bathroom Home Access: Stairs to enter Entrance Stairs-Rails: Right, Left, Can reach both Entrance Stairs-Number of Steps: 6 Bathroom Shower/Tub: Health Visitor: Handicapped height Bathroom Accessibility: Yes How Accessible: Accessible via wheelchair, Accessible via walker Home Care Services: No  Discharge Living Setting Plans for Discharge Living Setting: Patient's home, House,  Lives with (comment) (Lives with husband.) Type of Home at Discharge: House Discharge Home Layout: Multi-level, Able to live on main level with bedroom/bathroom Alternate Level Stairs-Number of Steps: 14 Discharge Home Access: Stairs to enter Entrance Stairs-Rails: Right, Left, Can reach both Entrance Stairs-Number of Steps: 6 Discharge Bathroom Shower/Tub: Walk-in shower, Door Discharge Bathroom Toilet: Handicapped height Discharge Bathroom Accessibility: Yes How Accessible: Accessible via walker Does the patient have any problems obtaining your medications?: No  Social/Family/Support Systems Patient Roles: Spouse Contact Information: Yailin Biederman - spouse - [phone] Anticipated Caregiver: Husband Ability/Limitations of Caregiver: Husband is reitred, works prn from home, can provide care and supervision Caregiver Availability: 24/7 Discharge Plan Discussed with Primary Caregiver: Yes Is Caregiver In Agreement with Plan?: Yes Does Caregiver/Family have Issues with Lodging/Transportation while Pt is in Rehab?: No  Goals Patient/Family Goal for  Rehab: PT/OT mod I and supervision goals Expected length of stay: 10-12 days Pt/Family Agrees to Admission and willing to participate: Yes Program Orientation Provided & Reviewed with Pt/Caregiver Including Roles  & Responsibilities: Yes  Decrease burden of Care through IP rehab admission: N/A  Possible need for SNF placement upon discharge: Not planned  Patient Condition: I have reviewed medical records from [facility], spoken with CM, and patient. I met with patient at the bedside for inpatient rehabilitation assessment.  Patient will benefit from ongoing PT and OT, can actively participate in 3 hours of therapy a day 5 days of the week, and can make measurable gains during the admission.  Patient will also benefit from the coordinated team approach during an Inpatient Acute Rehabilitation admission.  The patient will receive intensive therapy as well as Rehabilitation physician, nursing, social worker, and care management interventions.  Due to bladder management, bowel management, safety, skin/wound care, disease management, medication administration, pain management, and patient education the patient requires 24 hour a day rehabilitation nursing.  The patient is currently min A with mobility and basic ADLs.  Discharge setting and therapy post discharge at home with home health is anticipated.  Patient has agreed to participate in the Acute Inpatient Rehabilitation Program and will admit today.  Preadmission Screen Completed By:  Leita KATHEE Kleine, 03/07/2024 10:31 AM ______________________________________________________________________   Discussed status with Dr. Emeline  on 03/07/24 at 900 and received approval for admission today.  Admission Coordinator:  Leita KATHEE Kleine, CCC-SLP, time 1030/Date 10/26//25 with updates by Leita Kleine, MS, CCC-SLP   Assessment/Plan: Diagnosis: Debility secondary to small bowel obstruction status post ex lap with stricturoplasty 10-13 Does the need for  close, 24 hr/day Medical supervision in concert with the patient's rehab needs make it unreasonable for this patient to be served in a less intensive setting? Yes Co-Morbidities requiring supervision/potential complications: SBO, surgical site infection, postop seroma, anasarca, electrolyte deficiencies, hypertension, Factor V lieden, GERD, anxiety/depression. Due to bladder management, bowel management, safety, skin/wound care, disease management, medication administration, pain management, and patient education, does the patient require 24 hr/day rehab nursing? Yes Does the patient require coordinated care of a physician, rehab nurse, PT, OT  to address physical and functional deficits in the context of the above medical diagnosis(es)? Yes Addressing deficits in the following areas: balance, endurance, locomotion, strength, transferring, bowel/bladder control, bathing, dressing, feeding, grooming, and toileting Can the patient actively participate in an intensive therapy program of at least 3 hrs of therapy 5 days a week? Yes The potential for patient to make measurable gains while on inpatient rehab is good Anticipated functional outcomes upon discharge from inpatient  rehab: supervision PT, supervision OT Estimated rehab length of stay to reach the above functional goals is: 10-12 days Anticipated discharge destination: Home 10. Overall Rehab/Functional Prognosis: good   MD Signature:  Joesph JAYSON Likes, DO 03/07/2024

## 2024-02-28 NOTE — Plan of Care (Signed)

## 2024-02-28 NOTE — Progress Notes (Signed)
 PHARMACY - TOTAL PARENTERAL NUTRITION CONSULT NOTE   Indication: Prolonged ileus  Patient Measurements: Height: 5' 5.5 (166.4 cm) Weight: 81.6 kg (180 lb) IBW/kg (Calculated) : 58.15 TPN AdjBW (KG): 64 Body mass index is 29.5 kg/m. Usual Weight: 82.3 kg   Assessment: 63 yof who presented to the ED with abdominal pain and nausea found to have a SBO on CT. PMH includes FVL, DVT, HLD, HTN, C-section, cholecystectomy, oophorectomy. Surgery consulted, NGT placed, and SB protocol completed on 10/10. After several days of conservative management, the SBO was not improving and she was taken to the OR on 10/13. She has not had a BM or flatulence since surgery and has not eaten since admission. Pharmacy consulted to begin TPN.   Glucose / Insulin: DM, HgbA1C 5.8; CBG's 106-183; 5 units of iSS given over past 24 hours Electrolytes: Na 143, K 3.4, Cl 113, CO2 22, Ca 7.5 [CoCa 9.34], Phos 2.5, Mag 1.9 Renal: BUN 11 / scr 0.52 Hepatic: Alk Phos 71, AST/ALT 15/13, Tbili 0.7, Alb 1.7, TG 193 Intake / Output; MIVF: UOP + 3 unmeasured, NG uncharted, Stool 3+ unmeasured GI Imaging: 10/13 DG abd portable 1V shows persistent unchanged SBO GI Surgeries / Procedures: 10/13 ExLap  Central access:  TPN start date: 10/17  Nutritional Goals: Goal TPN rate is 80 mL/hr (provides 90 g of protein and 2000 kcals per day)  RD Assessment: Estimated Needs Total Energy Estimated Needs: 1632-2040 kcal Total Protein Estimated Needs: 81-98 grams Total Fluid Estimated Needs: 1.6-2.0L/d  Current Nutrition:  NPO and TPN  Plan:  Increase TPN at 60 mL/hr at 1800 Electrolytes in TPN: Na 70 mEq/L, K 75 mEq/L, Ca 8 mEq/L, Mg 10 mEq/L, and Phos 20 mmol/L. Cl:Ac maximize acetate Add standard MVI and trace elements to TPN Continue thiamine 100 mg iv qday x5 Magnesium  1 g iv x1 K runs IV x4 K Phos 15 mmol IV x1 Initiate Sensitive q4h SSI and adjust as needed  Reduce MIVF to 65 mL/hr at 1800 Monitor TPN labs on  Mon/Thurs, prn  Radhika Dershem BS, PharmD, BCPS Clinical Pharmacist 02/28/2024 6:57 AM  Contact: [phone] after 3 PM

## 2024-02-28 NOTE — Progress Notes (Signed)
 PROGRESS NOTE  Kristine [name]  DOB: 06-Oct-1955  PCP: [name] LABOR, MD FMW:[id number]  DOA: 02/19/2024  LOS: 8 days  Hospital Day: 10  Subjective: Patient was seen and examined this morning.  Sitting up at the edge of the bed.  Not in distress.  In the last 24 hours, she has had several liquid bowel movement and wants something to slow them down. Afebrile, blood pressure running elevated 140s to 170s overnight, breathing room air Labs this morning with potassium low at 3.4, albumin low at 1.7, blood glucose less than 200 consistently  Brief narrative: Kristine [name] is a 68 y.o. female with PMH significant for HTN, HLD, factor V Leiden, DVT currently not on anticoagulation, GERD, anxiety/depression,; PSH including cesarean section, cholecystectomy, tubal ligation, right oophorectomy. 10/9, patient presented to the ED with complaint of cramping abdominal pain for several hours associated with nausea. CT angio of the chest, abdomen and pelvis revealed high-grade mid small bowel obstruction within the right hemipelvis with proximal small bowel dilatation up to 3 cm and decompressed distal bowel and colon.    General Surgery was consulted Admitted to [facility] Started on conservative management for bowel obstruction with bowel rest, NG suction, IV hydration. However, did not have significant improvement despite 72 hours of conservative management. 10/13, underwent ex lap, repair of small bowel enterotomy, small bowel stricturoplasty.  Assessment and plan: Small bowel obstruction S/p ex lap, stricturoplasty -1013 Dr. Vernetta Presented with small bowel obstruction in the setting of multiple prior abdominal surgeries.   SBO did not resolve with several days of conservative management and ultimately required surgery Postop ileus improved and she is actually having multiple bowel movements in the last 24 hours.  Discussed with surgery today.  Kaopectate recommended Allowed for clear  liquid diet but oral intake not adequate.  Currently on TPN and maintenance fluid.  Hypokalemia/hypomagnesemia Electrolyte levels being monitored regularly. Potassium level 3.4 this morning.  Replacement ordered. Recent Labs  Lab 02/22/24 0259 02/24/24 0425 02/25/24 0451 02/26/24 0511 02/26/24 0512 02/27/24 0655 02/28/24 0617  K 3.7 4.3 3.6  --  3.1* 3.5 3.4*  MG 1.9  --  1.6* 2.4  --  1.9 1.9  PHOS 3.2  --  3.4 2.1*  --  2.6 2.5   AKI Noted postop rise in creatinine. With IV hydration, creatinine is better. Continue IV fluid for next 24 hours. Continue to monitor  Recent Labs    09/30/23 1027 02/19/24 1950 02/21/24 0750 02/22/24 0259 02/24/24 0425 02/25/24 0451 02/26/24 0512 02/27/24 0655 02/28/24 0617  BUN 17 15 10 12 20  25* 19 14 11   CREATININE 0.72 0.93 0.70 0.66 1.22* 1.05* 0.76 0.58 0.52  CO2 27 27 23 25  21* 21* 21* 21* 22   Hypernatremia Improved with hydration. Continue to monitor Recent Labs  Lab 02/22/24 0259 02/24/24 0425 02/25/24 0451 02/26/24 0512 02/27/24 0655 02/28/24 0617  NA 142 146* 146* 143 144 143   Prediabetes Hypoglycemic episode A1c 5.8 on 12/23/2023 Patient denies history of diabetes. States she was taking metformin  1000 mg daily for weight loss. Currently metformin  is on hold While on postop ileus, dextrose drip was used to maintain blood glucose level.  Currently on TPN. Continue SSI/Accu-Cheks.  Recent Labs  Lab 02/27/24 2030 02/27/24 2357 02/28/24 0423 02/28/24 0808 02/28/24 1144  GLUCAP 146* 141* 170* 171* 184*   Hypertension H/o PVCs PTA meds- Toprol  25 mg daily, Lasix  PRN Currently on IV metoprolol  5 mg every 8 hours scheduled.   H/o  factor V Leiden and prior DVTs Was on anticoagulation for 1 year at some point  Since then, not on chronic anticoagulation  Continue DVT prophylaxis with subcutaneous heparin.  HLD PTA meds- aspirin 81 mg daily, Zocor  10 mg daily Currently both on hold  GERD Continue PPI  IV  Anxiety/depression PTA meds- bupropion  300 mg daily Resume once oral intake ensured   Mobility: PT eval obtained.  Encourage ambulation PT Orders:   PT Follow up Rec: Acute Inpatient Rehab (3hours/Day)02/26/2024 1258   Goals of care   Code Status: Full Code     DVT prophylaxis:  enoxaparin (LOVENOX) injection 40 mg Start: 02/26/24 1200 Place and maintain sequential compression device Start: 02/24/24 0043   Antimicrobials: Perioperative antibiotics Fluid: NS infusion running currently. Consultants: General Surgery Family Communication: Husband not at bedside today  Status: Inpatient Level of care:  Telemetry Medical.    Patient is from: Home Needs to continue in-hospital care: Diarrhea for last 24 hours Anticipated d/c to: Pending clinical course.  CIR recommended by PT.  Diet:  Diet Order             Diet full liquid Room service appropriate? Yes; Fluid consistency: Thin  Diet effective now                   Scheduled Meds:  bismuth subsalicylate  30 mL Oral BID   Chlorhexidine Gluconate Cloth  6 each Topical Daily   enoxaparin (LOVENOX) injection  40 mg Subcutaneous Q24H   fluticasone   2 spray Each Nare Daily   insulin aspart  0-9 Units Subcutaneous Q4H   liver oil-zinc oxide   Topical QID   methocarbamol (ROBAXIN) injection  250 mg Intravenous Q8H   metoprolol  tartrate  5 mg Intravenous Q8H   pantoprazole (PROTONIX) IV  40 mg Intravenous QAC breakfast   sodium chloride  flush  10-40 mL Intracatheter Q12H   thiamine (VITAMIN B1) injection  100 mg Intravenous Daily    PRN meds: hydrALAZINE, HYDROmorphone (DILAUDID) injection, liver oil-zinc oxide, liver oil-zinc oxide, prochlorperazine, sodium chloride  flush, sodium chloride  flush   Infusions:   sodium chloride      Followed by   sodium chloride      potassium PHOSPHATE IVPB (in mmol) 15 mmol (02/28/24 0943)   TPN ADULT (ION) 40 mL/hr at 02/28/24 0233   TPN ADULT (ION)       Antimicrobials: Anti-infectives (From admission, onward)    Start     Dose/Rate Route Frequency Ordered Stop   02/23/24 1400  ceFAZolin (ANCEF) IVPB 1 g/50 mL premix  Status:  Discontinued        1 g 100 mL/hr over 30 Minutes Intravenous Every 8 hours 02/23/24 1222 02/25/24 1016       Objective: Vitals:   02/28/24 0810 02/28/24 1431  BP: (!) 167/85 (!) 167/83  Pulse: 91 73  Resp: 16 16  Temp: 98.3 F (36.8 C) 98.4 F (36.9 C)  SpO2: 94% 100%    Intake/Output Summary (Last 24 hours) at 02/28/2024 1526 Last data filed at 02/28/2024 0302 Gross per 24 hour  Intake 1742.98 ml  Output --  Net 1742.98 ml   Filed Weights   02/19/24 1931 02/23/24 1337  Weight: 81.6 kg 81.6 kg   Weight change:  Body mass index is 29.5 kg/m.   Physical Exam: General exam: Pleasant, elderly Caucasian female.  Frustrated because of diarrhea overnight Skin: No rashes, lesions or ulcers. HEENT: Atraumatic, normocephalic, no obvious bleeding Lungs: Clear to auscultation bilaterally,  CVS:  S1, S2, no murmur,   GI/Abd: Mild appropriate postop tenderness, nondistended, bowel sound present CNS: Alert, awake, oriented x 3 Psychiatry: Better mood today Extremities: No pedal edema, no calf tenderness,   Data Review: I have personally reviewed the laboratory data and studies available.  F/u labs ordered Unresulted Labs (From admission, onward)     Start     Ordered   03/01/24 0500  Comprehensive metabolic panel  (TPN Lab Panel)  Every Mon,Thu (0500),   R      02/26/24 1454   03/01/24 0500  Magnesium   (TPN Lab Panel)  Every Mon,Thu (0500),   R      02/26/24 1454   03/01/24 0500  Phosphorus  (TPN Lab Panel)  Every Mon,Thu (0500),   R      02/26/24 1454   03/01/24 0500  Triglycerides  (TPN Lab Panel)  Every Monday (0500),   R      02/26/24 1454   02/29/24 0500  Renal function panel  Tomorrow morning,   R       Question:  Specimen collection method  Answer:  IV Team=IV Team collect    02/28/24 0823   02/29/24 0500  Magnesium   Tomorrow morning,   R       Question:  Specimen collection method  Answer:  IV Team=IV Team collect   02/28/24 9176            Signed, Chapman Rota, MD [facility] 02/28/2024

## 2024-02-28 NOTE — Progress Notes (Signed)
 IP rehab admissions - I met with patient at the bedside.  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  Her husband works prn from home and can provide 24/7 care and supervision.  She would like CIR prior to home.  I will have a partner follow up Sunday or Monday for plans.  [phone]

## 2024-02-29 ENCOUNTER — Inpatient Hospital Stay (HOSPITAL_COMMUNITY)

## 2024-02-29 DIAGNOSIS — K56609 Unspecified intestinal obstruction, unspecified as to partial versus complete obstruction: Secondary | ICD-10-CM | POA: Diagnosis not present

## 2024-02-29 LAB — RENAL FUNCTION PANEL
Albumin: 1.7 g/dL — ABNORMAL LOW (ref 3.5–5.0)
Anion gap: 8 (ref 5–15)
BUN: 7 mg/dL — ABNORMAL LOW (ref 8–23)
CO2: 24 mmol/L (ref 22–32)
Calcium: 7.6 mg/dL — ABNORMAL LOW (ref 8.9–10.3)
Chloride: 109 mmol/L (ref 98–111)
Creatinine, Ser: 0.47 mg/dL (ref 0.44–1.00)
GFR, Estimated: >60 mL/min (ref >60)
Glucose, Bld: 198 mg/dL — ABNORMAL HIGH (ref 70–99)
Phosphorus: 3.1 mg/dL (ref 2.5–4.6)
Potassium: 3.7 mmol/L (ref 3.5–5.1)
Sodium: 141 mmol/L (ref 135–145)

## 2024-02-29 LAB — GLUCOSE, CAPILLARY
Glucose-Capillary: 190 mg/dL — ABNORMAL HIGH (ref 70–99)
Glucose-Capillary: 223 mg/dL — ABNORMAL HIGH (ref 70–99)
Glucose-Capillary: 210 mg/dL — ABNORMAL HIGH (ref 70–99)
Glucose-Capillary: 173 mg/dL — ABNORMAL HIGH (ref 70–99)
Glucose-Capillary: 224 mg/dL — ABNORMAL HIGH (ref 70–99)

## 2024-02-29 LAB — MAGNESIUM: Magnesium: 2 mg/dL (ref 1.7–2.4)

## 2024-02-29 MED ORDER — PSYLLIUM 95 % PO PACK
1.0000 | PACK | Freq: Two times a day (BID) | ORAL | Status: DC
  Administered 2024-02-29 – 2024-03-07 (×14): 1 via ORAL
  Filled 2024-02-29 (×16): qty 1

## 2024-02-29 MED ORDER — SODIUM CHLORIDE 0.9 % IV SOLN: INTRAVENOUS | Status: DC

## 2024-02-29 MED ORDER — POTASSIUM CHLORIDE 10 MEQ/50ML IV SOLN
10.0000 meq | INTRAVENOUS | Status: AC
  Administered 2024-02-29 (×3): 10 meq via INTRAVENOUS
  Filled 2024-02-29 (×3): qty 50

## 2024-02-29 MED ORDER — TRAVASOL 10 % IV SOLN
INTRAVENOUS | Status: AC
  Filled 2024-02-29: qty 902.4

## 2024-02-29 MED ORDER — IOHEXOL 350 MG/ML SOLN
75.0000 mL | Freq: Once | INTRAVENOUS | Status: AC | PRN
  Administered 2024-02-29: 75 mL via INTRAVENOUS

## 2024-02-29 MED ORDER — BISMUTH SUBSALICYLATE 262 MG/15ML PO SUSP
30.0000 mL | Freq: Two times a day (BID) | ORAL | Status: DC
  Filled 2024-02-29: qty 236

## 2024-02-29 MED ORDER — DIPHENOXYLATE-ATROPINE 2.5-0.025 MG PO TABS
2.0000 | ORAL_TABLET | Freq: Three times a day (TID) | ORAL | Status: DC | PRN
  Administered 2024-02-29 – 2024-03-07 (×7): 2 via ORAL
  Filled 2024-02-29 (×7): qty 2

## 2024-02-29 NOTE — Progress Notes (Signed)
 PROGRESS NOTE  [name]  DOB: Mar 15, 1956  PCP: [name] LABOR, MD FMW:[id number]  DOA: 02/19/2024  LOS: 9 days  Hospital Day: 11  Subjective: Patient was seen and examined this morning. Sitting up in bedside commode. Felt some relief with Pepto-Bismol yesterday.  Not having diarrhea again today. Noted plan from general surgery for Lomotil and Metamucil trial today  Afebrile, blood pressure in 160s mostly, heart rate in 90s, breathing on room air Labs this morning with potassium better at 3.7, BUN 7, albumin 1.7 Abdominal x-ray this morning showed resolving ileus  Brief narrative: Kristine [name] is a 68 y.o. female with PMH significant for HTN, HLD, factor V Leiden, DVT currently not on anticoagulation, GERD, anxiety/depression,; PSH including cesarean section, cholecystectomy, tubal ligation, right oophorectomy. 10/9, patient presented to the ED with complaint of cramping abdominal pain for several hours associated with nausea. CT angio of the chest, abdomen and pelvis revealed high-grade mid small bowel obstruction within the right hemipelvis with proximal small bowel dilatation up to 3 cm and decompressed distal bowel and colon.    General Surgery was consulted Admitted to [facility] Started on conservative management for bowel obstruction with bowel rest, NG suction, IV hydration. However, did not have significant improvement despite 72 hours of conservative management. 10/13, underwent ex lap, repair of small bowel enterotomy, small bowel stricturoplasty.  Assessment and plan: Small bowel obstruction S/p ex lap, stricturoplasty -1013 Dr. Vernetta Presented with small bowel obstruction in the setting of multiple prior abdominal surgeries.   SBO did not resolve with several days of conservative management and ultimately required surgery Postop ileus improved and for the last 48 hours, she is actually having multiple bowel movements  At some relief with Pepto-Bismol  yesterday. Noted plan for nasal surgery for a trial of Lomotil and Metamucil today On TPN  Hypokalemia/hypomagnesemia Electrolyte levels being monitored regularly. Recent Labs  Lab 02/25/24 0451 02/26/24 0511 02/26/24 0512 02/27/24 0655 02/28/24 0617 02/29/24 0331  K 3.6  --  3.1* 3.5 3.4* 3.7  MG 1.6* 2.4  --  1.9 1.9 2.0  PHOS 3.4 2.1*  --  2.6 2.5 3.1   AKI Bilateral lower extremity edema Had elevated creatinine postop.   With IV hydration, creatinine is better.  BUN in single digit today.  Developing bilateral pedal edema.  Getting TPN as well.  Because of persistent diarrhea loss, I would continue NS but at a reduced rate of 30 mL/h Continue to monitor  Recent Labs    09/30/23 1027 02/19/24 1950 02/21/24 0750 02/22/24 0259 02/24/24 0425 02/25/24 0451 02/26/24 0512 02/27/24 0655 02/28/24 0617 02/29/24 0331  BUN 17 15 10 12 20  25* 19 14 11  7*  CREATININE 0.72 0.93 0.70 0.66 1.22* 1.05* 0.76 0.58 0.52 0.47  CO2 27 27 23 25  21* 21* 21* 21* 22 24   Hypernatremia Improved with hydration. Continue to monitor Recent Labs  Lab 02/24/24 0425 02/25/24 0451 02/26/24 0512 02/27/24 0655 02/28/24 0617 02/29/24 0331  NA 146* 146* 143 144 143 141   Prediabetes Hypoglycemic episode A1c 5.8 on 12/23/2023 Patient denies history of diabetes. States she was taking metformin  1000 mg daily for weight loss. Currently metformin  is on hold While on postop ileus, dextrose drip was used to maintain blood glucose level.  Currently on TPN. Continue SSI/Accu-Cheks.  Recent Labs  Lab 02/28/24 2045 02/28/24 2332 02/29/24 0400 02/29/24 0851 02/29/24 1147  GLUCAP 203* 178* 190* 223* 210*   Hypertension H/o PVCs PTA meds- Toprol  25 mg  daily, Lasix  PRN Currently on IV metoprolol  5 mg every 8 hours scheduled.   H/o factor V Leiden and prior DVTs Was on anticoagulation for 1 year at some point  Since then, not on chronic anticoagulation  Continue DVT prophylaxis with  subcutaneous heparin.  HLD PTA meds- aspirin 81 mg daily, Zocor  10 mg daily Currently both on hold  GERD Continue PPI IV  Anxiety/depression PTA meds- bupropion  300 mg daily Resume once oral intake ensured   Mobility: PT eval obtained.  Encourage ambulation PT Orders:   PT Follow up Rec: Acute Inpatient Rehab (3hours/Day)02/26/2024 1258   Goals of care   Code Status: Full Code     DVT prophylaxis:  enoxaparin (LOVENOX) injection 40 mg Start: 02/26/24 1200 Place and maintain sequential compression device Start: 02/24/24 0043   Antimicrobials: Perioperative antibiotics Fluid: NS, TPN Consultants: General Surgery Family Communication: Husband at bedside today  Status: Inpatient Level of care:  Telemetry Medical.    Patient is from: Home Needs to continue in-hospital care: Diarrhea for last 48 hours Anticipated d/c to: Pending clinical course.  CIR recommended by PT.  Diet:  Diet Order             Diet full liquid Room service appropriate? Yes; Fluid consistency: Thin  Diet effective now                   Scheduled Meds:  Chlorhexidine Gluconate Cloth  6 each Topical Daily   enoxaparin (LOVENOX) injection  40 mg Subcutaneous Q24H   fluticasone   2 spray Each Nare Daily   hydrocortisone   Rectal TID   insulin aspart  0-9 Units Subcutaneous Q4H   liver oil-zinc oxide   Topical QID   methocarbamol (ROBAXIN) injection  250 mg Intravenous Q8H   metoprolol  tartrate  5 mg Intravenous Q8H   pantoprazole (PROTONIX) IV  40 mg Intravenous QAC breakfast   psyllium  1 packet Oral BID   sodium chloride  flush  10-40 mL Intracatheter Q12H   thiamine (VITAMIN B1) injection  100 mg Intravenous Daily    PRN meds: diphenoxylate-atropine, hydrALAZINE, HYDROmorphone (DILAUDID) injection, liver oil-zinc oxide, liver oil-zinc oxide, prochlorperazine, sodium chloride  flush, sodium chloride  flush   Infusions:   sodium chloride      TPN ADULT (ION) 60 mL/hr at 02/29/24 0500    TPN ADULT (ION)      Antimicrobials: Anti-infectives (From admission, onward)    Start     Dose/Rate Route Frequency Ordered Stop   02/23/24 1400  ceFAZolin (ANCEF) IVPB 1 g/50 mL premix  Status:  Discontinued        1 g 100 mL/hr over 30 Minutes Intravenous Every 8 hours 02/23/24 1222 02/25/24 1016       Objective: Vitals:   02/29/24 1146 02/29/24 1508  BP: (!) 160/84   Pulse: 88   Resp: 16   Temp: 98.2 F (36.8 C) 99.1 F (37.3 C)  SpO2: 98%     Intake/Output Summary (Last 24 hours) at 02/29/2024 1525 Last data filed at 02/29/2024 0500 Gross per 24 hour  Intake 1384.78 ml  Output --  Net 1384.78 ml   Filed Weights   02/19/24 1931 02/23/24 1337  Weight: 81.6 kg 81.6 kg   Weight change:  Body mass index is 29.5 kg/m.   Physical Exam: General exam: Pleasant, elderly Caucasian female.  Looks tired because of persistent diarrhea Skin: No rashes, lesions or ulcers. HEENT: Atraumatic, normocephalic, no obvious bleeding Lungs: Clear to auscultation bilaterally,  CVS: S1,  S2, no murmur,   GI/Abd: Mild appropriate postop tenderness, nondistended, bowel sound present CNS: Alert, awake, oriented x 3 Psychiatry: Better mood today Extremities: Developing bilateral trace to 1+ pedal edema, no calf tenderness,   Data Review: I have personally reviewed the laboratory data and studies available.  F/u labs ordered Unresulted Labs (From admission, onward)     Start     Ordered   03/01/24 0500  Comprehensive metabolic panel  (TPN Lab Panel)  Every Mon,Thu (0500),   R      02/26/24 1454   03/01/24 0500  Magnesium   (TPN Lab Panel)  Every Mon,Thu (0500),   R      02/26/24 1454   03/01/24 0500  Phosphorus  (TPN Lab Panel)  Every Mon,Thu (0500),   R      02/26/24 1454   03/01/24 0500  Triglycerides  (TPN Lab Panel)  Every Monday (0500),   R      02/26/24 1454            Signed, Chapman Rota, MD [facility] 02/29/2024

## 2024-02-29 NOTE — Progress Notes (Signed)
 PHARMACY - TOTAL PARENTERAL NUTRITION CONSULT NOTE   Indication: Prolonged ileus  Patient Measurements: Height: 5' 5.5 (166.4 cm) Weight: 81.6 kg (180 lb) IBW/kg (Calculated) : 58.15 TPN AdjBW (KG): 64 Body mass index is 29.5 kg/m. Usual Weight: 82.3 kg   Assessment: 49 yof who presented to the ED with abdominal pain and nausea found to have a SBO on CT. PMH includes FVL, DVT, HLD, HTN, C-section, cholecystectomy, oophorectomy. Surgery consulted, NGT placed, and SB protocol completed on 10/10. After several days of conservative management, the SBO was not improving and she was taken to the OR on 10/13. She has not had a BM or flatulence since surgery and has not eaten since admission. Pharmacy consulted to begin TPN.   Glucose / Insulin: DM, HgbA1C 5.8; CBG's 178-203; 13 units of iSS given over past 24 hours Electrolytes: Na 141, K 3.7, Cl 109, CO2 24, Ca 7.6 [CoCa 9.44], Phos 3.1, Mag 2.0 Renal: BUN 7 / scr 0.47 Hepatic: Alk Phos 71, AST/ALT 15/13, Tbili 0.7, Alb 1.7, TG 193 Intake / Output; MIVF: UOP + 4 unmeasured, NG uncharted, Stool 7+ unmeasured GI Imaging: 10/13 DG abd portable 1V shows persistent unchanged SBO 10/19- DG abd 1 view features may reflect evolving ileus / SBO GI Surgeries / Procedures: 10/13 ExLap  Central access:  TPN start date: 10/17  Nutritional Goals: Goal TPN rate is 80 mL/hr (provides 90 g of protein and 2000 kcals per day)  RD Assessment: Estimated Needs Total Energy Estimated Needs: 1632-2040 kcal Total Protein Estimated Needs: 81-98 grams Total Fluid Estimated Needs: 1.6-2.0L/d  Current Nutrition:  NPO and TPN  Plan:  Increase TPN at 80 mL/hr at 1800 Electrolytes in TPN: Na 60 mEq/L, K 75 mEq/L, Ca 7 mEq/L, Mg 9 mEq/L, and Phos 18 mmol/L. Cl:Ac maximize acetate Add standard MVI and trace elements to TPN thiamine 100 mg iv qday x5 [10/17 >> 10/21] K runs IV x3 Initiate Sensitive q4h SSI and adjust as needed  Reduce MIVF to 45 mL/hr at  1800 Monitor TPN labs on Mon/Thurs, prn  Jarita Raval BS, PharmD, BCPS Clinical Pharmacist 02/29/2024 7:05 AM  Contact: [phone] after 3 PM

## 2024-02-29 NOTE — Progress Notes (Signed)
   02/29/24 1641  Assess: MEWS Score  Temp 98.8 F (37.1 C)  BP (!) 140/63  MAP (mmHg) 84  Pulse Rate (!) 112  Resp 20  SpO2 99 %  Assess: MEWS Score  MEWS Temp 0  MEWS Systolic 0  MEWS Pulse 2  MEWS RR 0  MEWS LOC 0  MEWS Score 2  MEWS Score Color Yellow  Assess: if the MEWS score is Yellow or Red  Were vital signs accurate and taken at a resting state? Yes  Does the patient meet 2 or more of the SIRS criteria? No  MEWS guidelines implemented  Yes, yellow  Treat  MEWS Interventions Considered administering scheduled or prn medications/treatments as ordered  Take Vital Signs  Increase Vital Sign Frequency  Yellow: Q2hr x1, continue Q4hrs until patient remains green for 12hrs  Escalate  MEWS: Escalate Yellow: Discuss with charge nurse and consider notifying provider and/or RRT  Notify: Charge Nurse/RN  Name of Charge Nurse/RN Notified Radiation protection practitioner, RN  Assess: SIRS CRITERIA  SIRS Temperature  0  SIRS Respirations  0  SIRS Pulse 1  SIRS WBC 0  SIRS Score Sum  1

## 2024-02-29 NOTE — Progress Notes (Signed)
 Paged Dr. Paola regarding patient condition.

## 2024-02-29 NOTE — Plan of Care (Signed)

## 2024-02-29 NOTE — Progress Notes (Signed)
 Paged Dr. Paola regarding purulent drainage from Pts. Wound.

## 2024-02-29 NOTE — Progress Notes (Signed)
 The abdominal incision had a mild serosanguineous drainage this morning. Picture attached in the media ( chart review). Drainage cleaned and new dressing applied

## 2024-02-29 NOTE — Progress Notes (Signed)
 Occupational Therapy Treatment Patient Details Name: Kristine [name] MRN: [id number] DOB: 1956/03/29 Today's Date: 02/29/2024   History of present illness Pt is a 68 y.o. female presented to [facility] ED 02/19/24 with c/o mid abdominal pain, cramp-like. CT revealed high-grade mid small bowel obstruction within the right hemipelvis with proximal small bowel dilatation up to 3 cm and decompressed distal bowel and colon. Pt s/p exploratory laparotomy with repair of small bowel enterotomy x 2 and mall bowel stricturoplasty 10/13. PMHx: factor V Leiden, DVT, gastritis, HLD, and HTN.   OT comments  Pt. Seen for skilled OT treatment session with spouse present for session and active in pts. Care throughout.  Pt. Able to stand-pivot transfer to bsc with CGA and cues for sequencing and hand placement for pivot secondary to multiple lines to manage.  LB dressing with MAX A secondary to abdomen pain and limited reach with RLE with a little more ease noted with LLE in figure 4 position.  Energy conservation strategies also reviewed with pt. And spouse as they report he will be assisting with LB adls prn.  Pt. Is highly motivated and eager for participation in skilled therapies with expressed goals to resume previous level of activity prior to hospital admission.  Remains limited during today's session secondary to increased frequency of diarrhea that is occurring every 30-45 min.  MD/RN aware and are working to address this issue.  Agee with current d/c recommendations for >3hrs/day of skilled therapies to allow for increased activity tolerance/safety during mobility/adls prior to home.  Cont. With acute OT POC.        If plan is discharge home, recommend the following:  A little help with walking and/or transfers;A little help with bathing/dressing/bathroom;Assistance with cooking/housework;Assistance with feeding;Assist for transportation;Help with stairs or ramp for entrance   Equipment Recommendations  None  recommended by OT    Recommendations for Other Services      Precautions / Restrictions Precautions Precautions: Fall;Other (comment) Recall of Precautions/Restrictions: Intact Precaution/Restrictions Comments: PICC       Mobility Bed Mobility               General bed mobility comments: pt. greeted in recliner, then transferred to bsc (pt. on bsc at end of session, spouse present with call bell in reach)    Transfers Overall transfer level: Needs assistance Equipment used: None Transfers: Sit to/from Stand, Bed to chair/wheelchair/BSC Sit to Stand: Contact guard assist     Step pivot transfers: Contact guard assist     General transfer comment: Cues for hand placement on rise. CGA for safety and requiring increased time to extend trunk.     Balance                                           ADL either performed or assessed with clinical judgement   ADL Overall ADL's : Needs assistance/impaired                     Lower Body Dressing: Sitting/lateral leans;Set up;Maximal assistance Lower Body Dressing Details (indicate cue type and reason): pt. able to manually move LLE into figure 4 position to access for LB dressing.  unable to achieve figure 4 with RLE secondary to strain on abdomen and pre existing knee pain.  declines need for A/E as spouse available to assist (was present for session and confirms this) reviewed  energy conservation compensatory strategies to them both for easing LB ADL burden Toilet Transfer: Contact guard assist;BSC/3in1;Stand-pivot     Toileting - Clothing Manipulation Details (indicate cue type and reason): not observed, pt. still using the b.room at end of session, but spouse is present and they both report he assists prn       General ADL Comments: pt. and spouse with several questions regarding in patient rehab unit. answered all questions that i was able to.    Extremity/Trunk Assessment               Vision       Restaurant manager, fast food Communication: No apparent difficulties   Cognition Arousal: Alert Behavior During Therapy: WFL for tasks assessed/performed Cognition: No apparent impairments                               Following commands: Intact        Cueing   Cueing Techniques: Verbal cues, Visual cues  Exercises      Shoulder Instructions       General Comments      Pertinent Vitals/ Pain       Pain Assessment Pain Assessment: Faces Faces Pain Scale: Hurts little more Pain Location: Abdomen, with positional changes Pain Descriptors / Indicators: Grimacing, Guarding Pain Intervention(s): Repositioned, Limited activity within patient's tolerance, Monitored during session  Home Living                                          Prior Functioning/Environment              Frequency  Min 2X/week        Progress Toward Goals  OT Goals(current goals can now be found in the care plan section)  Progress towards OT goals: Progressing toward goals     Plan      Co-evaluation                 AM-PAC OT 6 Clicks Daily Activity     Outcome Measure   Help from another person eating meals?: A Little Help from another person taking care of personal grooming?: A Little Help from another person toileting, which includes using toliet, bedpan, or urinal?: A Little Help from another person bathing (including washing, rinsing, drying)?: A Lot Help from another person to put on and taking off regular upper body clothing?: A Little Help from another person to put on and taking off regular lower body clothing?: A Lot 6 Click Score: 16    End of Session    OT Visit Diagnosis: Unsteadiness on feet (R26.81);Muscle weakness (generalized) (M62.81);Pain   Activity Tolerance Patient tolerated treatment well   Patient Left Other (comment) (left on bsc per pt. request, call bell in reach,  spouse present)   Nurse Communication Other (comment) (rn states ok to work with pt., present at beg. of session)        Time: 8962-8880 OT Time Calculation (min): 42 min  Charges: OT General Charges $OT Visit: 1 Visit OT Treatments $Self Care/Home Management : 38-52 mins  Randall, COTA/L Acute Rehabilitation [phone]   CHRISTELLA Nest Lorraine-COTA/L  02/29/2024, 11:52 AM

## 2024-02-29 NOTE — Progress Notes (Signed)
 Mobility Specialist Progress Note:    02/29/24 0934  Mobility  Activity Ambulated with assistance  Level of Assistance Standby assist, set-up cues, supervision of patient - no hands on  Assistive Device None  Distance Ambulated (ft) 50 ft (x2)  Activity Response Tolerated well  Mobility Referral Yes  Mobility visit 1 Mobility  Mobility Specialist Start Time (ACUTE ONLY) 0934  Mobility Specialist Stop Time (ACUTE ONLY) [id number]  Mobility Specialist Time Calculation (min) (ACUTE ONLY) 8 min   Received pt agreeable to session. No c/o any symptoms. Pt moving and ambulating well. Returned pt to room w/ all needs met.   Venetia Keel Mobility Specialist Please Neurosurgeon or Rehab Office at [phone]

## 2024-02-29 NOTE — Plan of Care (Signed)
  Problem: Education: Goal: Knowledge of General Education information will improve Description: Including pain rating scale, medication(s)/side effects and non-pharmacologic comfort measures Outcome: Progressing   Problem: Clinical Measurements: Goal: Will remain free from infection Outcome: Progressing Goal: Cardiovascular complication will be avoided Outcome: Progressing   Problem: Activity: Goal: Risk for activity intolerance will decrease Outcome: Progressing   Problem: Safety: Goal: Ability to remain free from injury will improve 02/29/2024 0747 by Janise Roxane DASEN, RN Outcome: Progressing 02/29/2024 0742 by Janise Roxane DASEN, RN Outcome: Progressing   Problem: Skin Integrity: Goal: Risk for impaired skin integrity will decrease Outcome: Progressing   Problem: Coping: Goal: Ability to adjust to condition or change in health will improve Outcome: Progressing

## 2024-02-29 NOTE — Progress Notes (Signed)
 Notified Dr. Arlice of patient c/o purulence from wound, and that patient is flushed and tachycardic.

## 2024-02-29 NOTE — Progress Notes (Signed)
 Assessment & Plan: POD#6 - S/P Diagnostic laparoscopy, Exploratory laparotomy, Repair of small bowel enterotomy x 2, small bowel stricturoplasty by Dr. Vicenta Poli on 02/23/2024 - continued profuse liquid diarrhea - full liquid diet - Kaopectate not on formulary at [facility] - Desitin ointment for perineal skin - Lomotil trial today - Add Metamucil as bulking agent   Will follow.  Resolving post op ileus.  Profuse loose BM's continues - will reluctantly try Lomotil and Metamucil to provide symptomatic improvement.  Discussed with patient and husband in room.        Krystal Spinner, MD [facility] practice Office: [phone]        Chief Complaint: SBO  Subjective: Patient up to bedside commode again.  Continued profuse diarrhea.  Frustrated.  Husband in room.  Objective: Vital signs in last 24 hours: Temp:  [97.8 F (36.6 C)-99 F (37.2 C)] 98.7 F (37.1 C) (10/19 0854) Pulse Rate:  [73-100] 94 (10/19 0400) Resp:  [16-20] 18 (10/19 0854) BP: (159-169)/(72-90) 159/90 (10/19 0854) SpO2:  [97 %-100 %] 98 % (10/19 0854) Last BM Date : 02/29/24  Intake/Output from previous day: 10/18 0701 - 10/19 0700 In: 1384.8 [I.V.:1384.8] Out: -  Intake/Output this shift: No intake/output data recorded.  Physical Exam: On bedside commode.  Reportedly some serosanguinous from wound.  Dressing change as needed.  Lab Results:  Recent Labs    02/27/24 0655  WBC 8.4  HGB 11.2*  HCT 34.5*  PLT 149*   BMET Recent Labs    02/28/24 0617 02/29/24 0331  NA 143 141  K 3.4* 3.7  CL 113* 109  CO2 22 24  GLUCOSE 183* 198*  BUN 11 7*  CREATININE 0.52 0.47  CALCIUM 7.5* 7.6*   PT/INR No results for input(s): LABPROT, INR in the last 72 hours. Comprehensive Metabolic Panel:    Component Value Date/Time   NA 141 02/29/2024 0331   NA 143 02/28/2024 0617   NA 143 01/08/2018 1605   K 3.7 02/29/2024 0331   K 3.4 (L) 02/28/2024 0617   CL 109  02/29/2024 0331   CL 113 (H) 02/28/2024 0617   CO2 24 02/29/2024 0331   CO2 22 02/28/2024 0617   BUN 7 (L) 02/29/2024 0331   BUN 11 02/28/2024 0617   BUN 12 01/08/2018 1605   CREATININE 0.47 02/29/2024 0331   CREATININE 0.52 02/28/2024 0617   GLUCOSE 198 (H) 02/29/2024 0331   GLUCOSE 183 (H) 02/28/2024 0617   CALCIUM 7.6 (L) 02/29/2024 0331   CALCIUM 7.5 (L) 02/28/2024 0617   AST 15 02/27/2024 0655   AST 13 (L) 02/26/2024 0512   ALT 13 02/27/2024 0655   ALT 14 02/26/2024 0512   ALKPHOS 71 02/27/2024 0655   ALKPHOS 64 02/26/2024 0512   BILITOT 0.7 02/27/2024 0655   BILITOT 1.0 02/26/2024 0512   PROT 4.5 (L) 02/27/2024 0655   PROT 4.7 (L) 02/26/2024 0512   ALBUMIN 1.7 (L) 02/29/2024 0331   ALBUMIN 1.7 (L) 02/28/2024 0617    Studies/Results: DG Abd 1 View Result Date: 02/29/2024 CLINICAL DATA:  Abdominal pain. EXAM: ABDOMEN - 1 VIEW COMPARISON:  02/23/2024 FINDINGS: Interval evolution of gaseous small bowel distension with decreased distension in the left and mid abdomen but some gas distended small bowel in the right abdomen appears progressive, measuring 4.7 cm diameter today. NG tube is been removed in the interval. Mild gaseous distention of stomach evident. Probable gas in the cecum/ascending colon without dilatation. Gas is visible  in the rectum. IMPRESSION: Interval evolution of gaseous small bowel distension with decreased distension in the left and mid abdomen but some gas distended small bowel in the right abdomen appears progressive. Imaging features may reflect evolving ileus or small bowel obstruction. Electronically Signed   By: Camellia Candle M.D.   On: 02/29/2024 06:36      Krystal Spinner 02/29/2024  Patient ID: Kristine [name], female   DOB: [birth year], 68 y.o.   MRN: [id number]

## 2024-03-01 DIAGNOSIS — K56609 Unspecified intestinal obstruction, unspecified as to partial versus complete obstruction: Secondary | ICD-10-CM | POA: Diagnosis not present

## 2024-03-01 LAB — GLUCOSE, CAPILLARY
Glucose-Capillary: 157 mg/dL — ABNORMAL HIGH (ref 70–99)
Glucose-Capillary: 170 mg/dL — ABNORMAL HIGH (ref 70–99)
Glucose-Capillary: 180 mg/dL — ABNORMAL HIGH (ref 70–99)
Glucose-Capillary: 181 mg/dL — ABNORMAL HIGH (ref 70–99)
Glucose-Capillary: 199 mg/dL — ABNORMAL HIGH (ref 70–99)
Glucose-Capillary: 214 mg/dL — ABNORMAL HIGH (ref 70–99)

## 2024-03-01 LAB — CBC
HCT: 33.2 % — ABNORMAL LOW (ref 36.0–46.0)
Hemoglobin: 10.7 g/dL — ABNORMAL LOW (ref 12.0–15.0)
MCH: 28.5 pg (ref 26.0–34.0)
MCHC: 32.2 g/dL (ref 30.0–36.0)
MCV: 88.5 fL (ref 80.0–100.0)
Platelets: 183 K/uL (ref 150–400)
RBC: 3.75 MIL/uL — ABNORMAL LOW (ref 3.87–5.11)
RDW: 13.9 % (ref 11.5–15.5)
WBC: 12.8 K/uL — ABNORMAL HIGH (ref 4.0–10.5)
nRBC: 0 % (ref 0.0–0.2)

## 2024-03-01 LAB — COMPREHENSIVE METABOLIC PANEL WITH GFR
ALT: 24 U/L (ref 0–44)
AST: 22 U/L (ref 15–41)
Albumin: 1.6 g/dL — ABNORMAL LOW (ref 3.5–5.0)
Alkaline Phosphatase: 88 U/L (ref 38–126)
Anion gap: 8 (ref 5–15)
BUN: 9 mg/dL (ref 8–23)
CO2: 24 mmol/L (ref 22–32)
Calcium: 7.9 mg/dL — ABNORMAL LOW (ref 8.9–10.3)
Chloride: 105 mmol/L (ref 98–111)
Creatinine, Ser: 0.54 mg/dL (ref 0.44–1.00)
GFR, Estimated: >60 mL/min (ref >60)
Glucose, Bld: 200 mg/dL — ABNORMAL HIGH (ref 70–99)
Potassium: 4.3 mmol/L (ref 3.5–5.1)
Sodium: 137 mmol/L (ref 135–145)
Total Bilirubin: 0.4 mg/dL (ref 0.0–1.2)
Total Protein: 4.1 g/dL — ABNORMAL LOW (ref 6.5–8.1)

## 2024-03-01 LAB — PHOSPHORUS: Phosphorus: 3.6 mg/dL (ref 2.5–4.6)

## 2024-03-01 LAB — TRIGLYCERIDES: Triglycerides: 121 mg/dL (ref <150)

## 2024-03-01 LAB — MAGNESIUM: Magnesium: 1.9 mg/dL (ref 1.7–2.4)

## 2024-03-01 MED ORDER — FUROSEMIDE 10 MG/ML IJ SOLN
40.0000 mg | Freq: Once | INTRAMUSCULAR | Status: AC
  Administered 2024-03-01: 40 mg via INTRAVENOUS
  Filled 2024-03-01: qty 4

## 2024-03-01 MED ORDER — TRAVASOL 10 % IV SOLN
INTRAVENOUS | Status: AC
  Filled 2024-03-01: qty 864

## 2024-03-01 MED ORDER — METHOCARBAMOL 500 MG PO TABS: 500.0000 mg | ORAL_TABLET | Freq: Three times a day (TID) | ORAL | Status: DC | PRN

## 2024-03-01 MED ORDER — TRAMADOL HCL 50 MG PO TABS
50.0000 mg | ORAL_TABLET | Freq: Four times a day (QID) | ORAL | Status: DC | PRN
  Administered 2024-03-01 – 2024-03-03 (×3): 50 mg via ORAL
  Filled 2024-03-01 (×3): qty 1

## 2024-03-01 MED ORDER — ACETAMINOPHEN 500 MG PO TABS
1000.0000 mg | ORAL_TABLET | Freq: Three times a day (TID) | ORAL | Status: DC
  Administered 2024-03-01 – 2024-03-07 (×18): 1000 mg via ORAL
  Filled 2024-03-01 (×19): qty 2

## 2024-03-01 MED ORDER — TRAMADOL HCL 50 MG PO TABS: 100.0000 mg | ORAL_TABLET | Freq: Two times a day (BID) | ORAL | Status: DC | PRN

## 2024-03-01 MED ORDER — PIPERACILLIN-TAZOBACTAM 3.375 G IVPB
3.3750 g | Freq: Three times a day (TID) | INTRAVENOUS | Status: DC
  Administered 2024-03-01 – 2024-03-07 (×19): 3.375 g via INTRAVENOUS
  Filled 2024-03-01 (×19): qty 50

## 2024-03-01 NOTE — Plan of Care (Signed)

## 2024-03-01 NOTE — Plan of Care (Signed)

## 2024-03-01 NOTE — Inpatient Diabetes Management (Signed)
 Inpatient Diabetes Program Recommendations  AACE/ADA: New Consensus Statement on Inpatient Glycemic Control   Target Ranges:  Prepandial:   less than 140 mg/dL      Peak postprandial:   less than 180 mg/dL (1-2 hours)      Critically ill patients:  140 - 180 mg/dL   Lab Results  Component Value Date   GLUCAP 170 (H) 03/01/2024   HGBA1C 5.8 (A) 12/23/2023    Latest Reference Range & Units 02/29/24 04:00 02/29/24 08:51 02/29/24 11:47 02/29/24 15:57 02/29/24 20:00 02/29/24 23:59 03/01/24 04:15 03/01/24 07:50  Glucose-Capillary 70 - 99 mg/dL 809 (H) 776 (H) 789 (H) 173 (H) 224 (H) 214 (H) 180 (H) 170 (H)   Review of Glycemic Control  Diabetes history: Pre-diabetic  Outpatient Diabetes medications:  Metformin  1,000mg  daily for weight loss   Current orders for Inpatient glycemic control:  Novolog 0-9 units Q4HRS   Inpatient Diabetes Program Recommendations:   Noted: Patient receiving TPN @ goal rate of 17ml/hr.   If TPN is continued, please consider adding Regular 15 units to the TPN.   Thanks, Lavanda Search, RN, MSN, [facility]  Inpatient Diabetes Coordinator  Pager [phone] (8a-5p)

## 2024-03-01 NOTE — Progress Notes (Signed)
 PHARMACY - TOTAL PARENTERAL NUTRITION CONSULT NOTE   Indication: Prolonged ileus  Patient Measurements: Height: 5' 5.5 (166.4 cm) Weight: 81.6 kg (180 lb) IBW/kg (Calculated) : 58.15 TPN AdjBW (KG): 64 Body mass index is 29.5 kg/m. Usual Weight: 82.3 kg   Assessment:  7 YOF who presented to the ED with abdominal pain and nausea found to have a SBO on CT. PMH includes FVL, DVT, HLD, HTN, C-section, cholecystectomy, oophorectomy. Surgery consulted, NGT placed, and SB protocol completed on 10/10. After several days of conservative management, the SBO was not improving and she was taken to the OR on 10/13. She has not had a BM or flatulence since surgery and has not eaten since admission. Pharmacy consulted to begin TPN.   Glucose / Insulin: hx DM, A1c 5.8; CBG's 170-200s - used 16 units of SSI/24 hours Electrolytes: Na 137, K 4.3, Cl 105, CO2 24, CoCa 9.4, Phos 3.6, Mag 1.9 Renal: BUN 9 / Scr 0.54 Hepatic: Alk Phos 88, AST/ALT wnl, Tbili 0.7, Alb 1.6, TG 193 Intake / Output; MIVF: UOP + 4 unmeasured, NG uncharted, Stool 7+ unmeasured  GI Imaging: 10/13 DG abd portable 1V shows persistent unchanged SBO 10/19- DG abd 1 view features may reflect evolving ileus / SBO GI Surgeries / Procedures: 10/13 ExLap  Central access:  TPN start date: 10/17  Nutritional Goals: Goal TPN rate is 80 mL/hr (provides ~86 g of protein and ~1800 kcals per day)  RD Assessment: Estimated Needs Total Energy Estimated Needs: 1632-2040 kcal Total Protein Estimated Needs: 81-98 grams Total Fluid Estimated Needs: 1.6-2.0L/d  Current Nutrition:  NPO and TPN  Plan:  Continue TPN at 80 mL/hr at 1800 to meet 100% kcal and nutritional goals Electrolytes in TPN: Na 60 mEq/L, K 75 mEq/L, Ca 7 mEq/L, Mg 10 mEq/L, and Phos 15 mmol/L. Cl:Ace maximize acetate Add standard MVI and trace elements to TPN Thiamine 100 mg IV qday x5 [10/17 >> 10/21] Add 10 units insulin to TPN Continue Sensitive q4h SSI and adjust as  needed  Reduce MIVF to 30 mL/hr - managed per MD Monitor TPN labs on Mon/Thurs, daily as needed  Thank you for allowing pharmacy to be a part of this patient's care.  Shelba Collier, PharmD, BCPS Clinical Pharmacist

## 2024-03-01 NOTE — Progress Notes (Signed)
 Inpatient Rehab Admissions Coordinator:   CIR following. Not yet stable for d/c, remains on TPN.   Leita Kleine, MS, CCC-SLP Rehab Admissions Coordinator  [phone] (celll) [phone] (office)

## 2024-03-01 NOTE — Progress Notes (Addendum)
 Progress Note  7 Days Post-Op  Subjective: Patient reports drainage from her incision. Was having diarrhea previously but this has improved. Having minimal nausea. Denies vomiting. Denies significant pain.   ROS  All negative with the exception of above.   Objective: Vital signs in last 24 hours: Temp:  [98.4 F (36.9 C)-99.7 F (37.6 C)] 98.4 F (36.9 C) (10/20 0810) Pulse Rate:  [93-122] 93 (10/20 0810) Resp:  [16-20] 16 (10/20 0810) BP: (140-175)/(63-76) 162/74 (10/20 0810) SpO2:  [94 %-99 %] 95 % (10/20 0810) Last BM Date : 02/29/24  Intake/Output from previous day: No intake/output data recorded. Intake/Output this shift: No intake/output data recorded.  PE: General: Pleasant female who is laying in bedside chair in NAD. HEENT: Head is normocephalic, atraumatic.  Heart: HR normal. Lungs: Respiratory effort nonlabored. Abd: Soft, ND. Appropriate tenderness to palpation around incision. Midline incision with staples present. Erythema and purulent drainage noted around lower portion of incision. No rebound tenderness or guarding. MS: Pedal pulses and radial pulses present. Edema noted bilateral lower extremities.  Psych: A&Ox3 with an appropriate affect.    Lab Results:  No results for input(s): WBC, HGB, HCT, PLT in the last 72 hours. BMET Recent Labs    02/29/24 0331 03/01/24 0358  NA 141 137  K 3.7 4.3  CL 109 105  CO2 24 24  GLUCOSE 198* 200*  BUN 7* 9  CREATININE 0.47 0.54  CALCIUM 7.6* 7.9*   PT/INR No results for input(s): LABPROT, INR in the last 72 hours. CMP     Component Value Date/Time   NA 137 03/01/2024 0358   NA 143 01/08/2018 1605   K 4.3 03/01/2024 0358   CL 105 03/01/2024 0358   CO2 24 03/01/2024 0358   GLUCOSE 200 (H) 03/01/2024 0358   BUN 9 03/01/2024 0358   BUN 12 01/08/2018 1605   CREATININE 0.54 03/01/2024 0358   CALCIUM 7.9 (L) 03/01/2024 0358   PROT 4.1 (L) 03/01/2024 0358   ALBUMIN 1.6 (L) 03/01/2024 0358    AST 22 03/01/2024 0358   ALT 24 03/01/2024 0358   ALKPHOS 88 03/01/2024 0358   BILITOT 0.4 03/01/2024 0358   GFRNONAA >60 03/01/2024 0358   GFRAA 107 01/08/2018 1605   Lipase     Component Value Date/Time   LIPASE 33 02/19/2024 1950       Studies/Results: CT ABDOMEN PELVIS W CONTRAST Result Date: 03/01/2024 EXAM: CT ABDOMEN AND PELVIS WITH CONTRAST 02/29/2024 10:41:00 PM TECHNIQUE: CT of the abdomen and pelvis was performed with the administration of 75 mL of iohexol (OMNIPAQUE) 350 MG/ML injection. Multiplanar reformatted images are provided for review. Automated exposure control, iterative reconstruction, and/or weight-based adjustment of the mA/kV was utilized to reduce the radiation dose to as low as reasonably achievable. COMPARISON: CTA chest, abdomen and pelvis 02/20/2024, CT of abdomen and pelvis with contrast 08/31/2003. CLINICAL HISTORY: Intra-abdominal abscess. Chief complaints: Abdominal pain. Exploratory laparotomy on 02/23/2024. FINDINGS: LOWER CHEST: Bilateral symmetric minimal layering pleural effusions. Mild subsegmental atelectasis posterior lung bases. Lung bases are otherwise clear. The cardiac size is normal. No pneumothorax. LIVER: The liver is unremarkable. GALLBLADDER AND BILE DUCTS: The gallbladder is absent. No biliary ductal dilatation. SPLEEN: No acute abnormality. PANCREAS: Partial pancreatic atrophy. No mass or ductal dilatation. ADRENAL GLANDS: No acute abnormality. KIDNEYS, URETERS AND BLADDER: No stones in the kidneys or ureters. No hydronephrosis. No renal mass enhancement. No perinephric or periureteral stranding. Urinary bladder is unremarkable. GI AND BOWEL: The gastric wall and duodenum  are unremarkable. Beginning at the ligament of Treitz, there is small bowel dilatation throughout up to 4 cm, except for normal caliber right lower quadrant segments. There are thickened and angulated segments in the mid to lower abdomen, any one of which could be  transitional. The small bowel dilatation could be obstructive or due to ileus or enteritis . The appendix is normal. The wall of the colon is unremarkable. PERITONEUM AND RETROPERITONEUM: There is scattered free intraperitoneal air anteriorly in the pelvis to the left and in the midline. This is probably related to the recent surgery. Correlate clinically to exclude perforated bowel. There is slight enhancement and thickening of the peritoneal reflections along with patchy mesenteric edema in the lower abdomen and pelvis, which also could be postsurgical or inflammatory. There is a thin-walled pelvic fluid collection in between the uterus and bladder (series 4, axial 78) measuring 8.9 x 7.3 cm and 15 Hounsfield units. . The collection appears uncomplicated. This is most likely a seroma. Early presentation of abscess is possible. There is a small amount of ascites in the perihepatic space and scattered interloop ascites in the mid and lower abdomen. VASCULATURE: Aorta is normal in caliber. LYMPH NODES: No lymphadenopathy. REPRODUCTIVE ORGANS: No acute abnormality. BONES AND SOFT TISSUES: New midline laparotomy skin staples. Small subincisional fluid collection at the superior aspect measuring 3.6 x 2.7 cm and 18 Hounsfield units, probable seroma. Just below the fluid collection, there are a few tiny subcutaneous air pockets, nonspecific and could be related to the recent closure or infectious. There is diffuse increased body wall anasarca, probably from third spacing and fluid overload. There is chronic nonerosive bilateral sacroiliitis. Degenerative change lower lumbar spine with acquired spinal stenosis of L3-L4. No acute or other significant osseous findings. IMPRESSION: 1. Small bowel dilatation up to 4 cm with thickened and angulated segments in the mid to lower abdomen that may represent transitional segments; findings may indicate small-bowel obstruction or postoperative ileus / enteritis .The small bowel is  normal in caliber in the right lower quadrant. 2. Scattered free intraperitoneal air, favored postsurgical; recommend close clinical follow up and, if indicated, imaging follow-up to exclude bowel perforation. 3. Thin-walled pelvic fluid collection (8.9 x 7.3 cm, 15 HU) between the uterus and bladder, most consistent with a postoperative seroma; early abscess not excluded and short-interval follow-up is recommended. 4. Patchy mesenteric edema along with slightly enhancing peritoneal reflections, which could be postoperative etiology or inflammatory. 5. Diffuse increased body wall anasarca, likely from third spacing and fluid overload. 6. Subincisional air and fluid as described above. 7. Small amount of ascites. Electronically signed by: Francis Quam MD 03/01/2024 12:02 AM EDT RP Workstation: [id number]   DG Abd 1 View Result Date: 02/29/2024 CLINICAL DATA:  Abdominal pain. EXAM: ABDOMEN - 1 VIEW COMPARISON:  02/23/2024 FINDINGS: Interval evolution of gaseous small bowel distension with decreased distension in the left and mid abdomen but some gas distended small bowel in the right abdomen appears progressive, measuring 4.7 cm diameter today. NG tube is been removed in the interval. Mild gaseous distention of stomach evident. Probable gas in the cecum/ascending colon without dilatation. Gas is visible in the rectum. IMPRESSION: Interval evolution of gaseous small bowel distension with decreased distension in the left and mid abdomen but some gas distended small bowel in the right abdomen appears progressive. Imaging features may reflect evolving ileus or small bowel obstruction. Electronically Signed   By: Camellia Candle M.D.   On: 02/29/2024 06:36    Anti-infectives: Anti-infectives (  From admission, onward)    Start     Dose/Rate Route Frequency Ordered Stop   02/23/24 1400  ceFAZolin (ANCEF) IVPB 1 g/50 mL premix  Status:  Discontinued        1 g 100 mL/hr over 30 Minutes Intravenous Every 8 hours  02/23/24 1222 02/25/24 1016        Assessment/Plan POD#7 - S/P Diagnostic laparoscopy, Exploratory laparotomy, Repair of small bowel enterotomy x 2, small bowel stricturoplasty by Dr. Vicenta Poli on 02/23/2024 - CT from 10/19 showed small bowel dilatation up to 4 cm with thickened and angulated segments in the mid to lower abdomen that may represent transitional segments; findings may indicate small-bowel obstruction or postoperative ileus / enteritis. The small bowel is normal in caliber in the right lower quadrant. Scattered free intraperitoneal air, favored postsurgical; recommend close clinical follow up and, if indicated, imaging follow-up to exclude bowel perforation. Thin-walled pelvic fluid collection (8.9 x 7.3 cm, 15 HU) between the uterus and bladder, most consistent with a postoperative seroma; early abscess not excluded and short-interval follow-up is recommended. Patchy mesenteric edema along with slightly enhancing peritoneal reflections, which could be postoperative etiology or inflammatory. Diffuse increased body wall anasarca, likely from third spacing and fluid overload. Subincisional air and fluid as described above. Small amount of ascites. - Imaging to be discussed further with attending.  - Due to erythema and drainage, staples of the bottom half of midline incision was removed. Drainage expelled. Will initiate wet-to-dry dressing BID or when soiled. Will decide on antibiotics.  - Tolerating FLD. Will start Soft diet. - Had some success over the weekend regarding loose Bms.  - Will continue to follow.     FEN: Soft; TPN; IVF per primary team VTE: Enoxaparin ID: Will discuss with MD antibiotics for cellulitis    LOS: 10 days   I reviewed hospitalist notes, specialist notes, nursing notes, last 24 h vitals and pain scores, last 48 h intake and output, last 24 h labs and trends, and last 24 h imaging results.   Marjorie Carlyon Favre, [facility]  [facility] 03/01/2024, 12:42 PM Please see Amion for pager number during day hours 7:00am-4:30pm

## 2024-03-01 NOTE — Progress Notes (Signed)
 Nutrition Follow-up  DOCUMENTATION CODES:   Non-severe (moderate) malnutrition in context of acute illness/injury  INTERVENTION:  Continue TPN via PICC, managed by Pharmacy  Can consider weaning TPN tomorrow pending tolerance of diet advancement  GI Soft diet as tolerated, encourage PO intake Continue 100 mg thiamine IV for 5-7 days with start of TPN. Monitor K+, Phos and Mg x 3 days. Replete outside TPN as needed.    NUTRITION DIAGNOSIS:   Moderate Malnutrition related to acute illness (SBO/post op ileus) as evidenced by energy intake < or equal to 50% for > or equal to 5 days, mild muscle depletion.  GOAL:   Patient will meet greater than or equal to 90% of their needs   MONITOR:   Other (Comment), Labs, PO intake (TPN)  REASON FOR ASSESSMENT:   Consult New TPN/TNA  ASSESSMENT:   PMH significant for HTN, HLD, factor V Leiden, DVT currently not on anticoagulation, GERD, anxiety/depression,; PSH including cesarean section, cholecystectomy, tubal ligation, right oophorectomy. Presented to the ED with complaint of cramping abdominal pain for several hours associated with nausea.  10/13 - S/p ex lap, stricturoplasty  10/17- TPN started   Pt continues on TPN via PICC. Diet advanced and currently on FLD. Spoke to patient who reports surgery plans on advancing diet to GI soft. Pt reports tolerating full liquids and having liquid bowel movements, started on metamucil. RD reviewed menu with patient and spouse and encouraged them to call diet office to confirm what food options would be available on soft diet. Pt hesitant to start and oral supplements due to concern for GI upset/diarrhea excerebration, will f/u tomorrow to see how she is tolerating diet prior to starting ONS.   Meds:  SSI 0-9 units q 4 hrs, metamucil, thiamine 100 mg    Labs: Glu 200, calcium 7.9, CBG 170-224  Lines/Drains:  PICC double lumen     NUTRITION - FOCUSED PHYSICAL EXAM:  Flowsheet Row Most Recent  Value  Orbital Region No depletion  Upper Arm Region No depletion  Thoracic and Lumbar Region No depletion  Buccal Region No depletion  Temple Region Mild depletion  Clavicle Bone Region Mild depletion  Clavicle and Acromion Bone Region No depletion  Scapular Bone Region No depletion  Dorsal Hand No depletion  Patellar Region No depletion  Anterior Thigh Region No depletion  Posterior Calf Region No depletion  Hair Reviewed  Eyes Reviewed  Mouth Reviewed  Skin Reviewed  Nails Reviewed    Diet Order:   Diet Order             Diet full liquid Room service appropriate? Yes; Fluid consistency: Thin  Diet effective now                   EDUCATION NEEDS:   Education needs have been addressed  Skin:  Skin Assessment: Skin Integrity Issues: Skin Integrity Issues:: Incisions Incisions: abdomen  Last BM:  10/19  Height:   Ht Readings from Last 1 Encounters:  02/23/24 5' 5.5 (1.664 m)    Weight:   Wt Readings from Last 1 Encounters:  02/23/24 81.6 kg    Ideal Body Weight:  56.8 kg  BMI:  Body mass index is 29.5 kg/m.  Estimated Nutritional Needs:   Kcal:  8367-7959 kcal  Protein:  81-98 grams  Fluid:  1.6-2.0L/d  Madalyn Potters, MS, RD, LDN Clinical Dietitian  Contact via secure chat. If unavailable, use group chat RD Inpatient.

## 2024-03-01 NOTE — Progress Notes (Signed)
 PROGRESS NOTE  Kristine [name]  DOB: 03-Jan-1956  PCP: [name] LABOR, MD FMW:[id number]  DOA: 02/19/2024  LOS: 10 days  Hospital Day: 12  Subjective: Patient was seen and examined this morning. Sitting up in recliner.  Feels tired.  Could not sleep more than 3 hours last night as she was busy with late night CT scan and early morning blood work. No fever, heart rate 90s, blood pressure 140s to 160s, breathing room air Diarrhea seems to have stopped since last night. Labs this morning with renal function normal, albumin 1.6 Glucose level has been close to 200 in the last 24 hours   Yesterday, patient had persistent diarrhea.  Started to get tachycardic towards the evening. RN also noted purulent discharge from incision site. Seen by surgeon Dr. Terresa last night and ordered for CT abdomen which showed -Small bowel dilatation up to 4 cm with thickened angulated segments -Scattered free intraperitoneal air, favored postsurgical;  -Thin-walled pelvic fluid collection (8.9 x 7.3 cm, 15 HU) between the uterus and bladder, most consistent with a postoperative seroma; early abscess not excluded and short-interval follow-up is recommended. -Patchy mesenteric edema along with slightly enhancing peritoneal reflections, which could be postoperative etiology or inflammatory. -Diffuse increased body wall anasarca, likely from third spacing and fluid overload.  Brief narrative: Kristine [name] is a 68 y.o. female with PMH significant for HTN, HLD, factor V Leiden, DVT currently not on anticoagulation, GERD, anxiety/depression,; PSH including cesarean section, cholecystectomy, tubal ligation, right oophorectomy. 10/9, patient presented to the ED with complaint of cramping abdominal pain for several hours associated with nausea. CT angio of the chest, abdomen and pelvis revealed high-grade mid small bowel obstruction within the right hemipelvis with proximal small bowel dilatation up to 3 cm  and decompressed distal bowel and colon.    General Surgery was consulted Admitted to [facility] Started on conservative management for bowel obstruction with bowel rest, NG suction, IV hydration. However, did not have significant improvement despite 72 hours of conservative management. 10/13, underwent ex lap, repair of small bowel enterotomy, small bowel stricturoplasty.  Assessment and plan: Small bowel obstruction S/p ex lap, stricturoplasty -10/13 Dr. Vernetta Surgical site infection Postop seroma Presented with small bowel obstruction in the setting of multiple prior abdominal surgeries.   SBO did not resolve with several days of conservative management and ultimately required surgery Postop ileus improved but for the last 3 days, but she struggled with multiple episodes of diarrhea.  Was started on a trial of Lomotil and Metamucil on 10/19.  Last bowel movement last night. It seems patient has purulent drainage from abdominal incision site. CT abdomen 10/19 findings as above -still shows dilated small bowel up to 4 cm, scattered free intraperitoneal air as well as a thin-walled pelvic fluid collection likely postop seroma. General surgery follow-up Remains on TPN  Volume overload status AKI - resolved Had elevated creatinine postop.   Started on IV hydration.  Currently also on TPN.  Because of poor oral intake, albumin is low.  It seems patient is having anasarca as evidenced by worsening pedal edema as well as CT scan findings. Will stop normal saline and give Lasix  40 mg IV today Continue to monitor  Recent Labs    02/19/24 1950 02/21/24 0750 02/22/24 0259 02/24/24 0425 02/25/24 0451 02/26/24 0512 02/27/24 0655 02/28/24 0617 02/29/24 0331 03/01/24 0358  BUN 15 10 12 20  25* 19 14 11  7* 9  CREATININE 0.93 0.70 0.66 1.22* 1.05* 0.76 0.58 0.52 0.47  0.54  CO2 27 23 25  21* 21* 21* 21* 22 24 24    Hypokalemia/hypomagnesemia Electrolyte levels being monitored regularly. Recent  Labs  Lab 02/26/24 0511 02/26/24 0512 02/27/24 0655 02/28/24 0617 02/29/24 0331 03/01/24 0358  K  --  3.1* 3.5 3.4* 3.7 4.3  MG 2.4  --  1.9 1.9 2.0 1.9  PHOS 2.1*  --  2.6 2.5 3.1 3.6   Hypernatremia Improved with hydration. Continue to monitor Recent Labs  Lab 02/24/24 0425 02/25/24 0451 02/26/24 0512 02/27/24 0655 02/28/24 0617 02/29/24 0331 03/01/24 0358  NA 146* 146* 143 144 143 141 137   Prediabetes Hypoglycemic episode A1c 5.8 on 12/23/2023 Patient denies history of diabetes. States she was taking metformin  1000 mg daily for weight loss. On hold while in the hospital.   While on postop ileus, dextrose drip was used to maintain blood glucose level.  Currently on TPN. Continue SSI/Accu-Cheks.  Blood sugar level running elevated close to 200 Diabetes care coordinator consult obtained.  Discussed with pharmacy.  To add 15 units of regular insulin to TPN back today. Recent Labs  Lab 02/29/24 2000 02/29/24 2359 03/01/24 0415 03/01/24 0750 03/01/24 1144  GLUCAP 224* 214* 180* 170* 199*   Hypertension H/o PVCs PTA meds- Toprol  25 mg daily, Lasix  PRN Currently on IV metoprolol  5 mg every 8 hours scheduled. Blood pressure running elevated.  Hope IV Lasix  today will help her stop   H/o factor V Leiden and prior DVTs Was on anticoagulation for 1 year at some point  Since then, not on chronic anticoagulation  Currently on DVT prophylaxis with subcutaneous heparin. She has bilateral lower extremity edema likely from volume overload.  Obtain ultrasound duplex rule out DVT.  If DVT ruled out, will try compression stockings.  HLD PTA meds- aspirin 81 mg daily, Zocor  10 mg daily Currently both on hold  GERD Continue PPI IV  Anxiety/depression PTA meds- bupropion  300 mg daily Resume once oral intake ensured   Mobility: PT eval obtained.  Encourage ambulation PT Orders:   PT Follow up Rec: Acute Inpatient Rehab (3hours/Day)02/26/2024 1258   Goals of care    Code Status: Full Code     DVT prophylaxis:  enoxaparin (LOVENOX) injection 40 mg Start: 02/26/24 1200 Place and maintain sequential compression device Start: 02/24/24 0043   Antimicrobials: Perioperative antibiotics Fluid: NS, TPN Consultants: General Surgery Family Communication: Husband not at bedside today  Status: Inpatient Level of care:  Telemetry Medical.    Patient is from: Home Needs to continue in-hospital care: Surgical site infection, intraabdominal collection Anticipated d/c to: Pending clinical course. CIR recommended by PT.  Diet:  Diet Order             DIET SOFT Room service appropriate? Yes; Fluid consistency: Thin  Diet effective now                   Scheduled Meds:  acetaminophen  1,000 mg Oral TID   Chlorhexidine Gluconate Cloth  6 each Topical Daily   enoxaparin (LOVENOX) injection  40 mg Subcutaneous Q24H   fluticasone   2 spray Each Nare Daily   hydrocortisone   Rectal TID   insulin aspart  0-9 Units Subcutaneous Q4H   liver oil-zinc oxide   Topical QID   metoprolol  tartrate  5 mg Intravenous Q8H   pantoprazole (PROTONIX) IV  40 mg Intravenous QAC breakfast   psyllium  1 packet Oral BID   sodium chloride  flush  10-40 mL Intracatheter Q12H   thiamine (  VITAMIN B1) injection  100 mg Intravenous Daily    PRN meds: diphenoxylate-atropine, hydrALAZINE, HYDROmorphone (DILAUDID) injection, liver oil-zinc oxide, liver oil-zinc oxide, methocarbamol, prochlorperazine, sodium chloride  flush, sodium chloride  flush, traMADol, traMADol   Infusions:   piperacillin-tazobactam (ZOSYN)  IV     TPN ADULT (ION) 80 mL/hr at 02/29/24 1713   TPN ADULT (ION)      Antimicrobials: Anti-infectives (From admission, onward)    Start     Dose/Rate Route Frequency Ordered Stop   03/01/24 1400  piperacillin-tazobactam (ZOSYN) IVPB 3.375 g        3.375 g 12.5 mL/hr over 240 Minutes Intravenous Every 8 hours 03/01/24 1249     02/23/24 1400  ceFAZolin (ANCEF) IVPB  1 g/50 mL premix  Status:  Discontinued        1 g 100 mL/hr over 30 Minutes Intravenous Every 8 hours 02/23/24 1222 02/25/24 1016       Objective: Vitals:   03/01/24 0418 03/01/24 0810  BP: (!) 154/66 (!) 162/74  Pulse: (!) 106 93  Resp: 16 16  Temp: 98.9 F (37.2 C) 98.4 F (36.9 C)  SpO2: 94% 95%   No intake or output data in the 24 hours ending 03/01/24 1323  Filed Weights   02/19/24 1931 02/23/24 1337  Weight: 81.6 kg 81.6 kg   Weight change:  Body mass index is 29.5 kg/m.   Physical Exam: General exam: Pleasant, elderly Caucasian female.  Looks tired because of persistent diarrhea. Skin: No rashes, lesions or ulcers. HEENT: Atraumatic, normocephalic, no obvious bleeding Lungs: Clear to auscultation bilaterally,  CVS: S1, S2, no murmur,   GI/Abd: Mild appropriate postop tenderness, surgical site exam per general surgery bowel sound present CNS: Alert, awake, oriented x 3 Psychiatry: Looks tired Extremities: Developing bilateral trace to 1+ pedal edema, no calf tenderness,   Data Review: I have personally reviewed the laboratory data and studies available.  F/u labs ordered Unresulted Labs (From admission, onward)     Start     Ordered   03/02/24 0500  Basic metabolic panel with GFR  Daily,   R     Question:  Specimen collection method  Answer:  IV Team=IV Team collect  Placed in And Linked Group   03/01/24 1307   03/02/24 0500  Magnesium   Daily,   R     Question:  Specimen collection method  Answer:  IV Team=IV Team collect  Placed in And Linked Group   03/01/24 1307   03/02/24 0500  Phosphorus  Daily,   R     Question:  Specimen collection method  Answer:  IV Team=IV Team collect  Placed in And Linked Group   03/01/24 1307   03/01/24 1232  CBC  Add-on,   AD       Question:  Specimen collection method  Answer:  IV Team=IV Team collect   03/01/24 1232   03/01/24 0500  Comprehensive metabolic panel  (TPN Lab Panel)  Every Mon,Thu (0500),   R       02/26/24 1454   03/01/24 0500  Magnesium   (TPN Lab Panel)  Every Mon,Thu (0500),   R      02/26/24 1454   03/01/24 0500  Phosphorus  (TPN Lab Panel)  Every Mon,Thu (0500),   R      02/26/24 1454   03/01/24 0500  Triglycerides  (TPN Lab Panel)  Every Monday (0500),   R      02/26/24 1454  Signed, Chapman Rota, MD [facility] 03/01/2024

## 2024-03-01 NOTE — Progress Notes (Signed)
 PT Cancellation Note  Patient Details Name: Kristine [name] MRN: [id number] DOB: 1956/05/12   Cancelled Treatment:    Reason Eval/Treat Not Completed: (P) Fatigue/lethargy limiting ability to participate, pt politely declining PT session due to fatigue from near constant chair<>BSC throughout day, requesting this PTA return on next date. Will check back as schedule allows to continue with PT POC.  Therisa SAUNDERS. PTA Acute Rehabilitation Services Office: [phone]    [name] 03/01/2024, 4:11 PM

## 2024-03-02 ENCOUNTER — Inpatient Hospital Stay (HOSPITAL_COMMUNITY)

## 2024-03-02 DIAGNOSIS — M7989 Other specified soft tissue disorders: Secondary | ICD-10-CM | POA: Diagnosis not present

## 2024-03-02 LAB — BASIC METABOLIC PANEL WITH GFR
Anion gap: 8 (ref 5–15)
BUN: 13 mg/dL (ref 8–23)
CO2: 28 mmol/L (ref 22–32)
Calcium: 8.1 mg/dL — ABNORMAL LOW (ref 8.9–10.3)
Chloride: 100 mmol/L (ref 98–111)
Creatinine, Ser: 0.58 mg/dL (ref 0.44–1.00)
GFR, Estimated: >60 mL/min (ref >60)
Glucose, Bld: 174 mg/dL — ABNORMAL HIGH (ref 70–99)
Potassium: 4.3 mmol/L (ref 3.5–5.1)
Sodium: 136 mmol/L (ref 135–145)

## 2024-03-02 LAB — GLUCOSE, CAPILLARY
Glucose-Capillary: 139 mg/dL — ABNORMAL HIGH (ref 70–99)
Glucose-Capillary: 164 mg/dL — ABNORMAL HIGH (ref 70–99)
Glucose-Capillary: 171 mg/dL — ABNORMAL HIGH (ref 70–99)
Glucose-Capillary: 172 mg/dL — ABNORMAL HIGH (ref 70–99)
Glucose-Capillary: 188 mg/dL — ABNORMAL HIGH (ref 70–99)
Glucose-Capillary: 213 mg/dL — ABNORMAL HIGH (ref 70–99)

## 2024-03-02 LAB — PHOSPHORUS: Phosphorus: 5 mg/dL — ABNORMAL HIGH (ref 2.5–4.6)

## 2024-03-02 LAB — MAGNESIUM: Magnesium: 2.1 mg/dL (ref 1.7–2.4)

## 2024-03-02 MED ORDER — FUROSEMIDE 10 MG/ML IJ SOLN
40.0000 mg | Freq: Once | INTRAMUSCULAR | Status: AC
  Administered 2024-03-02: 40 mg via INTRAVENOUS
  Filled 2024-03-02: qty 4

## 2024-03-02 MED ORDER — TRAVASOL 10 % IV SOLN
INTRAVENOUS | Status: AC
  Filled 2024-03-02: qty 432

## 2024-03-02 NOTE — Progress Notes (Signed)
 PROGRESS NOTE  Kristine [name]  DOB: 07-26-1955  PCP: [name] LABOR, MD FMW:[id number]  DOA: 02/19/2024  LOS: 11 days  Hospital Day: 13  Subjective: Patient was seen and examined this morning.  Sitting up in recliner. Feels somewhat better than yesterday.- Few hours last night. Able to tolerate soft diet.  Last BM yesterday Feels that upper extremity swelling has improved and lower extremity swelling improving Afebrile, heart rate mostly in the 90s and 100s, blood pressure 150s and 160s, breathing on room air Labs this morning noted.  Renal function normal phosphorus elevated to 5. Seen by OT this morning.  Required moderate assistance for bathing task and self grooming.  Brief narrative: Kristine [name] is a 68 y.o. female with PMH significant for HTN, HLD, factor V Leiden, DVT currently not on anticoagulation, GERD, anxiety/depression,; PSH including cesarean section, cholecystectomy, tubal ligation, right oophorectomy. 10/9, patient presented to the ED with complaint of cramping abdominal pain for several hours associated with nausea. CT angio of the chest, abdomen and pelvis revealed high-grade mid small bowel obstruction within the right hemipelvis with proximal small bowel dilatation up to 3 cm and decompressed distal bowel and colon.    General Surgery was consulted Admitted to [facility] Started on conservative management for bowel obstruction with bowel rest, NG suction, IV hydration. However, did not have significant improvement despite 72 hours of conservative management. 10/13, underwent ex lap, repair of small bowel enterotomy, small bowel stricturoplasty.  Postop ileus improved with started to have high-volume diarrhea. 10/19, CT abdomen was repeated with findings as below -Small bowel dilatation up to 4 cm with thickened angulated segments -Scattered free intraperitoneal air, favored postsurgical;  -Thin-walled pelvic fluid collection (8.9 x 7.3 cm, 15 HU) between  the uterus and bladder, most consistent with a postoperative seroma; early abscess not excluded and short-interval follow-up is recommended. -Patchy mesenteric edema along with slightly enhancing peritoneal reflections, which could be postoperative etiology or inflammatory. -Diffuse increased body wall anasarca, likely from third spacing and fluid overload.  Assessment and plan: Small bowel obstruction S/p ex lap, stricturoplasty -10/13 Dr. Vernetta Presented with small bowel obstruction in the setting of multiple prior abdominal surgeries.   SBO did not resolve with several days of conservative management and ultimately required surgery Postop ileus improved but for the last 3 days, but she struggled with multiple episodes of diarrhea.   10/19, was started on a trial of Lomotil and Metamucil with some improvement Remains on TPN 10/20, started on soft diet.  Able to tolerate.  Surgical site infection Postop seroma 10/19, patient was noted to have purulent drainage from abdominal incision site.  See pictures attached. CT abdomen 10/19 findings as above -showed dilated small bowel up to 4 cm, scattered free intraperitoneal air as well as a thin-walled pelvic fluid collection likely postop seroma. General surgery following. 10/20, WC count was up to 12.8.  Started on IV Zosyn.  Monitor temperature and WBC trend. Recent Labs  Lab 02/25/24 0451 02/26/24 0512 02/27/24 0655 03/01/24 1405  WBC 9.6 8.8 8.4 12.8*   Volume overload status AKI - resolved Had elevated creatinine postop.   Given IV hydration.  Also started on TPN because of poor oral intake. Albumin remains low.   Complicated by anasarca, bilateral lower extremity edema 10/20, normal saline stopped.  Given 1 dose IV Lasix  40 mg.  Feels edema improving.  Repeat Lasix  IV 40 mg today. Continue to monitor  Recent Labs    02/21/24 0750 02/22/24 0259 02/24/24  0425 02/25/24 0451 02/26/24 0512 02/27/24 9344 02/28/24 0617  02/29/24 0331 03/01/24 0358 03/02/24 0422  BUN 10 12 20  25* 19 14 11  7* 9 13  CREATININE 0.70 0.66 1.22* 1.05* 0.76 0.58 0.52 0.47 0.54 0.58  CO2 23 25 21* 21* 21* 21* 22 24 24 28    Hypokalemia/hypomagnesemia Hyperphosphatemia Electrolyte levels being monitored regularly. Phosphorus level elevated at 5.  TPN adjustment per pharmacy Recent Labs  Lab 02/27/24 0655 02/28/24 0617 02/29/24 0331 03/01/24 0358 03/02/24 0422  K 3.5 3.4* 3.7 4.3 4.3  MG 1.9 1.9 2.0 1.9 2.1  PHOS 2.6 2.5 3.1 3.6 5.0*   Prediabetes Hypoglycemic episode A1c 5.8 on 12/23/2023 Patient denies history of diabetes. States she was taking metformin  1000 mg daily for weight loss. On hold while in the hospital.   While on postop ileus, dextrose drip was used to maintain blood glucose level.  Currently on TPN. Blood sugar level running elevated close to 200 Diabetes care coordinator consult obtained.  Discussed with pharmacy.   10/20, added 15 units of regular insulin to TPN back. Continue SSI/Accu-Cheks.   Recent Labs  Lab 03/01/24 1937 03/01/24 2311 03/02/24 0343 03/02/24 0835 03/02/24 1141  GLUCAP 157* 181* 172* 164* 188*   Hypertension H/o PVCs PTA meds- Toprol  25 mg daily, Lasix  PRN Currently on IV metoprolol  5 mg every 8 hours scheduled. Blood pressure running elevated to 150s and 160s.  Partly due to pain. IV Lasix  to help.   H/o factor V Leiden and prior DVTs Bilateral lower EXTR edema Was on anticoagulation for 1 year at some point  Since then, not on chronic anticoagulation  Currently on DVT prophylaxis with subcutaneous heparin. She has bilateral lower extremity edema likely from volume overload.   10/21, ultrasound duplex rule out DVT.  Will try ACE wraps  HLD PTA meds- aspirin 81 mg daily, Zocor  10 mg daily Currently both on hold  GERD Continue PPI IV  Anxiety/depression PTA meds- bupropion  300 mg daily Resume once oral intake ensured   Mobility: PT eval obtained.  Encourage  ambulation PT Orders:   PT Follow up Rec: Acute Inpatient Rehab (3hours/Day)02/26/2024 1258   Goals of care   Code Status: Full Code     DVT prophylaxis:  enoxaparin (LOVENOX) injection 40 mg Start: 02/26/24 1200 Place and maintain sequential compression device Start: 02/24/24 0043   Antimicrobials: IV Zosyn since 10/20 Fluid: TPN Consultants: General Surgery Family Communication: Husband not at bedside today  Status: Inpatient Level of care:  Telemetry Medical.    Patient is from: Home Needs to continue in-hospital care: Surgical site infection, intraabdominal collection Anticipated d/c to: Pending clinical course. CIR recommended by PT. . Diet:  Diet Order             DIET SOFT Room service appropriate? Yes; Fluid consistency: Thin  Diet effective now                   Scheduled Meds:  acetaminophen  1,000 mg Oral TID   Chlorhexidine Gluconate Cloth  6 each Topical Daily   enoxaparin (LOVENOX) injection  40 mg Subcutaneous Q24H   fluticasone   2 spray Each Nare Daily   hydrocortisone   Rectal TID   insulin aspart  0-9 Units Subcutaneous Q4H   liver oil-zinc oxide   Topical QID   metoprolol  tartrate  5 mg Intravenous Q8H   pantoprazole (PROTONIX) IV  40 mg Intravenous QAC breakfast   psyllium  1 packet Oral BID   sodium chloride   flush  10-40 mL Intracatheter Q12H    PRN meds: diphenoxylate-atropine, hydrALAZINE, HYDROmorphone (DILAUDID) injection, liver oil-zinc oxide, liver oil-zinc oxide, methocarbamol, prochlorperazine, sodium chloride  flush, sodium chloride  flush, traMADol, traMADol   Infusions:   piperacillin-tazobactam (ZOSYN)  IV 3.375 g (03/02/24 1408)   TPN ADULT (ION) 80 mL/hr at 03/02/24 0600   TPN ADULT (ION)      Antimicrobials: Anti-infectives (From admission, onward)    Start     Dose/Rate Route Frequency Ordered Stop   03/01/24 1400  piperacillin-tazobactam (ZOSYN) IVPB 3.375 g        3.375 g 12.5 mL/hr over 240 Minutes Intravenous  Every 8 hours 03/01/24 1249     02/23/24 1400  ceFAZolin (ANCEF) IVPB 1 g/50 mL premix  Status:  Discontinued        1 g 100 mL/hr over 30 Minutes Intravenous Every 8 hours 02/23/24 1222 02/25/24 1016       Objective: Vitals:   03/02/24 0351 03/02/24 0738  BP: (!) 159/77 (!) 164/79  Pulse: 86 91  Resp: 16 16  Temp: 97.8 F (36.6 C) 97.9 F (36.6 C)  SpO2: 97% 92%    Intake/Output Summary (Last 24 hours) at 03/02/2024 1430 Last data filed at 03/02/2024 0900 Gross per 24 hour  Intake 3117.82 ml  Output 450 ml  Net 2667.82 ml    Filed Weights   02/19/24 1931 02/23/24 1337  Weight: 81.6 kg 81.6 kg   Weight change:  Body mass index is 29.5 kg/m.   Physical Exam: General exam: Pleasant, elderly Caucasian female.  Feels a little better today. Skin: No rashes, lesions or ulcers. HEENT: Atraumatic, normocephalic, no obvious bleeding Lungs: Clear to auscultation bilaterally,  CVS: S1, S2, no murmur,   GI/Abd: Mild appropriate postop tenderness, surgical site exam per general surgery bowel sound present CNS: Alert, awake, oriented x 3 Psychiatry: Mood appropriate Extremities: bilateral 1+ pedal edema, no calf tenderness,   Data Review: I have personally reviewed the laboratory data and studies available.  F/u labs ordered Unresulted Labs (From admission, onward)     Start     Ordered   03/02/24 1255  CBC  Once,   R       Question:  Specimen collection method  Answer:  IV Team=IV Team collect   03/02/24 1254   03/02/24 0500  Basic metabolic panel with GFR  Daily,   R     Question:  Specimen collection method  Answer:  IV Team=IV Team collect  Placed in And Linked Group   03/01/24 1307   03/02/24 0500  Magnesium   Daily,   R     Question:  Specimen collection method  Answer:  IV Team=IV Team collect  Placed in And Linked Group   03/01/24 1307   03/02/24 0500  Phosphorus  Daily,   R     Question:  Specimen collection method  Answer:  IV Team=IV Team collect   Placed in And Linked Group   03/01/24 1307   03/01/24 0500  Comprehensive metabolic panel  (TPN Lab Panel)  Every Mon,Thu (0500),   R      02/26/24 1454   03/01/24 0500  Magnesium   (TPN Lab Panel)  Every Mon,Thu (0500),   R      02/26/24 1454   03/01/24 0500  Phosphorus  (TPN Lab Panel)  Every Mon,Thu (0500),   R      02/26/24 1454   03/01/24 0500  Triglycerides  (TPN Lab Panel)  Every Monday (0500),  R      02/26/24 1454            Signed, Chapman Rota, MD [facility] 03/02/2024

## 2024-03-02 NOTE — Progress Notes (Signed)
 Nutrition Follow-up  DOCUMENTATION CODES:   Non-severe (moderate) malnutrition in context of acute illness/injury  INTERVENTION:  Begin weaning TPN , managed by Pharmacy  GI Soft diet as tolerated, encourage PO intake Continue 100 mg thiamine IV for 5-7 days with start of TPN. Monitor K+, Phos and Mg x 3 days. Replete outside TPN as needed.    NUTRITION DIAGNOSIS:   Moderate Malnutrition related to acute illness (SBO/post op ileus) as evidenced by energy intake < or equal to 50% for > or equal to 5 days, mild muscle depletion.  GOAL:   Patient will meet greater than or equal to 90% of their needs   MONITOR:   Other (Comment), Labs, PO intake (TPN)  REASON FOR ASSESSMENT:   Consult New TPN/TNA  ASSESSMENT:   PMH significant for HTN, HLD, factor V Leiden, DVT currently not on anticoagulation, GERD, anxiety/depression,; PSH including cesarean section, cholecystectomy, tubal ligation, right oophorectomy. Presented to the ED with complaint of cramping abdominal pain for several hours associated with nausea.  10/13 - S/p ex lap, stricturoplasty  10/17- TPN started   Pt tolerating diet advancement to GI soft yesterday. She ate chicken salad, a roll and crackers for dinner last night; ate cheerios with half a carton of milk, and an english muffin. She had a large BM yesterday but has not had one so far today. RD brought patient a vanilla ensure to try, pt hesitant to start ONS due to potential to increase diarrhea. Discussed plant based ONS option available and encouraged pt to try that option or ensure tomorrow to allow herself another day to tolerate soft diet. Messaged Pharmacist and MD that TPN can begin to be weaned down now that patient is tolerating diet advancement.   Meds:  SSI 0-9 units q 4 hrs, metamucil   Labs: Glu 174, calcium 8.1, Phos 5.0, CBG 157-199  Lines/Drains:  PICC double lumen    NUTRITION - FOCUSED PHYSICAL EXAM:  Flowsheet Row Most Recent Value   Orbital Region No depletion  Upper Arm Region No depletion  Thoracic and Lumbar Region No depletion  Buccal Region No depletion  Temple Region Mild depletion  Clavicle Bone Region Mild depletion  Clavicle and Acromion Bone Region No depletion  Scapular Bone Region No depletion  Dorsal Hand No depletion  Patellar Region No depletion  Anterior Thigh Region No depletion  Posterior Calf Region No depletion  Hair Reviewed  Eyes Reviewed  Mouth Reviewed  Skin Reviewed  Nails Reviewed    Diet Order:   Diet Order             DIET SOFT Room service appropriate? Yes; Fluid consistency: Thin  Diet effective now                   EDUCATION NEEDS:   Education needs have been addressed  Skin:  Skin Assessment: Skin Integrity Issues: Skin Integrity Issues:: Incisions Incisions: abdomen  Last BM:  10/19  Height:   Ht Readings from Last 1 Encounters:  02/23/24 5' 5.5 (1.664 m)    Weight:   Wt Readings from Last 1 Encounters:  02/23/24 81.6 kg    Ideal Body Weight:  56.8 kg  BMI:  Body mass index is 29.5 kg/m.  Estimated Nutritional Needs:   Kcal:  8367-7959 kcal  Protein:  81-98 grams  Fluid:  1.6-2.0L/d  Madalyn Potters, MS, RD, LDN Clinical Dietitian  Contact via secure chat. If unavailable, use group chat RD Inpatient.

## 2024-03-02 NOTE — Progress Notes (Addendum)
 PHARMACY - TOTAL PARENTERAL NUTRITION CONSULT NOTE   Indication: Prolonged ileus  Patient Measurements: Height: 5' 5.5 (166.4 cm) Weight: 81.6 kg (180 lb) IBW/kg (Calculated) : 58.15 TPN AdjBW (KG): 64 Body mass index is 29.5 kg/m. Usual Weight: 82.3 kg   Assessment:  85 YOF who presented to the ED with abdominal pain and nausea found to have a SBO on CT. PMH includes FVL, DVT, HLD, HTN, C-section, cholecystectomy, oophorectomy. Surgery consulted, NGT placed, and SB protocol completed on 10/10. After several days of conservative management, the SBO was not improving and she was taken to the OR on 10/13. She has not had a BM or flatulence since surgery and has not eaten since admission. Pharmacy consulted to begin TPN.   Glucose / Insulin: hx DM, A1c 5.8; CBG's improving 150-200- used 15 units of SSI/24 hours + 10 units insulin in TPN Electrolytes: Na 136, K 4.3, Cl 100, CO2 28, CoCa ~9, Phos 5, Mag 2.1 Renal: BUN 9 / Scr 0.54 Hepatic: Alk Phos 88, AST/ALT wnl, Tbili 0.7, Alb 1.6, TG 193 Intake / Output; MIVF: UOP + 4 unmeasured, NG uncharted, Stool 7+ unmeasured  GI Imaging: 10/13 DG abd portable 1V shows persistent unchanged SBO 10/19- DG abd 1 view features may reflect evolving ileus / SBO GI Surgeries / Procedures: 10/13 ExLap  Central access:  TPN start date: 10/17  Nutritional Goals: Goal TPN rate is 80 mL/hr (provides ~86 g of protein and ~1800 kcals per day)  RD Assessment: Estimated Needs Total Energy Estimated Needs: 1632-2040 kcal Total Protein Estimated Needs: 81-98 grams Total Fluid Estimated Needs: 1.6-2.0L/d  Current Nutrition:  NPO and TPN  Plan:  Decrease TPN to 40 mL/hr at 1800 to meet 50% kcal and nutritional goals Electrolytes in TPN: Na 60 mEq/L, K 75 mEq/L, Ca 7 mEq/L, Mg 10 mEq/L, and Phos 5 mmol/L. Cl:Ace maximize acetate Add standard MVI and trace elements to TPN Thiamine 100 mg IV qday x5 [10/17 >> 10/21] Continue Sensitive q4h SSI and adjust as  needed  Reduce to 5 units insulin in TPN Monitor TPN labs on Mon/Thurs, daily as needed  Thank you for allowing pharmacy to be a part of this patient's care.  Shelba Collier, PharmD, BCPS Clinical Pharmacist

## 2024-03-02 NOTE — Progress Notes (Signed)
 Occupational Therapy Treatment Patient Details Name: Kristine [name] MRN: [id number] DOB: [birth year] Today's Date: 03/02/2024   History of present illness Pt is a 68 y.o. female presented to [facility] ED 02/19/24 with c/o mid abdominal pain, cramp-like. CT revealed high-grade mid small bowel obstruction within the right hemipelvis with proximal small bowel dilatation up to 3 cm and decompressed distal bowel and colon. Pt s/p exploratory laparotomy with repair of small bowel enterotomy x 2 and mall bowel stricturoplasty 10/13. PMHx: factor V Leiden, DVT, gastritis, HLD, and HTN.   OT comments  Pt is progressing well towards established goals. Focus of session on progressing activity tolerance and endurance, as well as increasing independence in ADL tasks. Pt endorsing increased fatigue this session following mobility engagement prior, requesting to remain seated in recliner. Pt required up to Mod A for bathing tasks this date and set-up grooming. OT to continue POC to facilitate progress towards functional goals. Current d/c recommendations for >3hrs/day of skilled therapies to allow for increased activity tolerance/safety during mobility/adls prior to home remains appropriate.       If plan is discharge home, recommend the following:  A little help with walking and/or transfers;A little help with bathing/dressing/bathroom;Assistance with cooking/housework;Assistance with feeding;Assist for transportation;Help with stairs or ramp for entrance   Equipment Recommendations  None recommended by OT    Recommendations for Other Services      Precautions / Restrictions Precautions Precautions: Fall;Other (comment) (abdominal) Recall of Precautions/Restrictions: Intact Precaution/Restrictions Comments: PICC; TPN Restrictions Weight Bearing Restrictions Per Provider Order: No       Mobility Bed Mobility               General bed mobility comments: Pt greeted in recliner, all tasks completed  in recliner    Transfers                   General transfer comment: Pt remained seated in recliner for session at Pt request.     Balance Overall balance assessment: Needs assistance Sitting-balance support: No upper extremity supported, Feet supported Sitting balance-Leahy Scale: Good Sitting balance - Comments: Pt sat in recliner with back support for session. Able to slightly anterior lean for UB bathing and maintain position for ~2 minutes.                                   ADL either performed or assessed with clinical judgement   ADL Overall ADL's : Needs assistance/impaired     Grooming: Wash/dry hands;Wash/dry face;Brushing hair;Set up;Sitting Grooming Details (indicate cue type and reason): Completed grooming tasks whil in long sitting position in recliner with back support Upper Body Bathing: Minimal assistance;Sitting Upper Body Bathing Details (indicate cue type and reason): Assist needed to bathe back Lower Body Bathing: Moderate assistance;Sitting/lateral leans Lower Body Bathing Details (indicate cue type and reason): LB bathing in recliner. Able to figure four LLE, increased assist needed for RLE. Slight lateral leans utilized to bathe posterior side of hips                       General ADL Comments: Pt with increased fatigue this date and engagement in mobility prior to session, requesting tasks completed whiles seated. Increased BLE edema observed.    Extremity/Trunk Assessment Upper Extremity Assessment Upper Extremity Assessment: Generalized weakness            Vision  Perception     Praxis     Communication Communication Communication: No apparent difficulties   Cognition Arousal: Alert Behavior During Therapy: WFL for tasks assessed/performed Cognition: No apparent impairments                               Following commands: Intact        Cueing   Cueing Techniques: Verbal cues   Exercises      Shoulder Instructions       General Comments Pt educated on edema management strategies. Verbalized understanding.    Pertinent Vitals/ Pain       Pain Assessment Pain Assessment: Faces Faces Pain Scale: Hurts a little bit Pain Location: Abdomen, with positional changes Pain Descriptors / Indicators: Discomfort, Grimacing, Guarding Pain Intervention(s): Limited activity within patient's tolerance, Monitored during session  Home Living                                          Prior Functioning/Environment              Frequency  Min 2X/week        Progress Toward Goals  OT Goals(current goals can now be found in the care plan section)  Progress towards OT goals: Progressing toward goals  Acute Rehab OT Goals Patient Stated Goal: To shower OT Goal Formulation: With patient Time For Goal Achievement: 03/10/24 Potential to Achieve Goals: Good ADL Goals Pt Will Perform Grooming: with set-up;standing Pt Will Perform Lower Body Bathing: with contact guard assist;sitting/lateral leans Pt Will Perform Lower Body Dressing: with contact guard assist;sitting/lateral leans Pt Will Transfer to Toilet: with contact guard assist;ambulating Additional ADL Goal #1: Pt will engage in 10 minute OOB ADL task with CGA to facilitate activity tolerance.  Plan      Co-evaluation                 AM-PAC OT 6 Clicks Daily Activity     Outcome Measure   Help from another person eating meals?: A Little Help from another person taking care of personal grooming?: A Little Help from another person toileting, which includes using toliet, bedpan, or urinal?: A Little Help from another person bathing (including washing, rinsing, drying)?: A Lot Help from another person to put on and taking off regular upper body clothing?: A Little Help from another person to put on and taking off regular lower body clothing?: A Lot 6 Click Score: 16    End of  Session    OT Visit Diagnosis: Unsteadiness on feet (R26.81);Muscle weakness (generalized) (M62.81);Pain Pain - part of body:  (abdomen)   Activity Tolerance Patient tolerated treatment well;Patient limited by fatigue   Patient Left in chair;with call bell/phone within reach;with nursing/sitter in room   Nurse Communication Mobility status        Time: 9161-9094 OT Time Calculation (min): 27 min  Charges: OT General Charges $OT Visit: 1 Visit OT Treatments $Self Care/Home Management : 23-37 mins  Maurilio CROME, OTR/L.  [facility] Acute Rehabilitation  Office: [phone]   Maurilio PARAS Karolee Meloni 03/02/2024, 9:29 AM

## 2024-03-02 NOTE — Progress Notes (Signed)
 Bilateral lower extremity venous duplex has been completed. Preliminary results can be found in CV Proc through chart review.   03/02/24 12:39 PM Cathlyn Collet RVT

## 2024-03-02 NOTE — Progress Notes (Signed)
 Physical Therapy Treatment Patient Details Name: Kristine [name] MRN: [id number] DOB: 03/12/1956 Today's Date: 03/02/2024   History of Present Illness Pt is a 68 y.o. female presented to [facility] ED 02/19/24 with c/o mid abdominal pain, cramp-like. CT revealed high-grade mid small bowel obstruction within the right hemipelvis with proximal small bowel dilatation up to 3 cm and decompressed distal bowel and colon. Pt s/p exploratory laparotomy with repair of small bowel enterotomy x 2 and mall bowel stricturoplasty 10/13. PMHx: factor V Leiden, DVT, gastritis, HLD, and HTN.    PT Comments  Pt up on BSC on arrival, agreeable to seated session as pt with urinary urgency and requesting to stay seated on BSC throughout session. Pt able to perform seated LE exercises for increased ROM and strength maintenance and verbalizing understanding of completion throughout day and between therapies. Continued education on importance and benefits of frequent mobilization to maximize functional mobility gains with pt reporting she has been up multiple times throughout day this date. Patient will benefit from intensive inpatient follow-up therapy, >3 hours/day, will continue to follow acutely.    If plan is discharge home, recommend the following: A little help with walking and/or transfers;A little help with bathing/dressing/bathroom;Assistance with cooking/housework;Assist for transportation;Help with stairs or ramp for entrance   Can travel by private vehicle        Equipment Recommendations  BSC/3in1    Recommendations for Other Services       Precautions / Restrictions Precautions Precautions: Fall;Other (comment) (abdominal) Recall of Precautions/Restrictions: Intact Precaution/Restrictions Comments: PICC; TPN Restrictions Weight Bearing Restrictions Per Provider Order: No     Mobility  Bed Mobility               General bed mobility comments: pt up on BSC on arrival, all activites perfomed  in sitting    Transfers                   General transfer comment: Pt remained seated on BSC for session at Pt request.    Ambulation/Gait                   Stairs             Wheelchair Mobility     Tilt Bed    Modified Rankin (Stroke Patients Only)       Balance Overall balance assessment: Needs assistance Sitting-balance support: No upper extremity supported, Feet supported Sitting balance-Leahy Scale: Good Sitting balance - Comments: Pt sat on BSC with back support for session.                                    Communication Communication Communication: No apparent difficulties  Cognition Arousal: Alert Behavior During Therapy: WFL for tasks assessed/performed                             Following commands: Intact      Cueing Cueing Techniques: Verbal cues  Exercises General Exercises - Lower Extremity Ankle Circles/Pumps: AROM, Both, 20 reps, Seated Long Arc Quad: AROM, Both, 10 reps, Seated Hip Flexion/Marching: AROM, Both, 5 reps, Seated (discussed performing x30 seconds x3 sets throughout day for increased activity tolerance when unable to ambulate due to urinary urgency)    General Comments        Pertinent Vitals/Pain Pain Assessment Pain Assessment: Faces Faces Pain Scale: Hurts a  little bit Pain Location: Abdomen, with positional changes Pain Descriptors / Indicators: Discomfort, Grimacing, Guarding Pain Intervention(s): Monitored during session, Limited activity within patient's tolerance    Home Living                          Prior Function            PT Goals (current goals can now be found in the care plan section) Acute Rehab PT Goals PT Goal Formulation: With patient Time For Goal Achievement: 03/09/24 Progress towards PT goals: Progressing toward goals    Frequency    Min 3X/week      PT Plan      Co-evaluation              AM-PAC PT 6 Clicks  Mobility   Outcome Measure  Help needed turning from your back to your side while in a flat bed without using bedrails?: A Little Help needed moving from lying on your back to sitting on the side of a flat bed without using bedrails?: A Little Help needed moving to and from a bed to a chair (including a wheelchair)?: A Little Help needed standing up from a chair using your arms (e.g., wheelchair or bedside chair)?: A Little Help needed to walk in hospital room?: A Little Help needed climbing 3-5 steps with a railing? : Total 6 Click Score: 16    End of Session   Activity Tolerance: Patient tolerated treatment well Patient left: with call bell/phone within reach;with family/visitor present (on [facility]) Nurse Communication: Mobility status PT Visit Diagnosis: Muscle weakness (generalized) (M62.81);Difficulty in walking, not elsewhere classified (R26.2);Other abnormalities of gait and mobility (R26.89);Unsteadiness on feet (R26.81)     Time: 8389-8378 PT Time Calculation (min) (ACUTE ONLY): 11 min  Charges:    $Therapeutic Exercise: 8-22 mins PT General Charges $$ ACUTE PT VISIT: 1 Visit                     Chiquitta Matty R. PTA Acute Rehabilitation Services Office: [phone]   [name] 03/02/2024, 4:37 PM

## 2024-03-02 NOTE — Progress Notes (Signed)
 Progress Note  8 Days Post-Op  Subjective: Patient reports abdominal pain that is manageable with current regimen. Denies worsening pain. She feels that her bowel movements are more formed. Diarrhea has improved greatly. Last BM yesterday. Reports flatulence yesterday. Tolerating soft diet without nausea and vomiting. Feels that her upper extremity edema has improved. Abdominal wound still has drainage. Last dressing change this morning.   ROS  All negative with the exception of above  Objective: Vital signs in last 24 hours: Temp:  [97.6 F (36.4 C)-97.9 F (36.6 C)] 97.9 F (36.6 C) (10/21 0738) Pulse Rate:  [86-106] 91 (10/21 0738) Resp:  [16-18] 16 (10/21 0738) BP: (154-164)/(77-83) 164/79 (10/21 0738) SpO2:  [92 %-98 %] 92 % (10/21 0738) Last BM Date : 03/01/24 (Simultaneous filing. User may not have seen previous data.)  Intake/Output from previous day: 10/20 0701 - 10/21 0700 In: 2997.8 [I.V.:2889.1; IV Piggyback:108.7] Out: 450 [Urine:450] Intake/Output this shift: Total I/O In: 120 [P.O.:120] Out: -   PE: General: Pleasant female who is in bedside chair in NAD. HEENT: Head is normocephalic, atraumatic.  Heart: HR normal. Lungs: Respiratory effort nonlabored. Abd: Soft, ND. Appropriate tenderness to palpation around incision. Midline incision with staples present. Some erythema present around staples at superior portion of incision (Has not worsened since previous exam). Inferior portion of wound were staples were removed has some erythema and drainage. Wet-to-dry dressing exchanged. No rebound tenderness or guarding. MS: Pedal pulses and radial pulses present. Edema noted bilateral lower extremities.  Psych: A&Ox3 with an appropriate affect.    Lab Results:  Recent Labs    03/01/24 1405  WBC 12.8*  HGB 10.7*  HCT 33.2*  PLT 183   BMET Recent Labs    03/01/24 0358 03/02/24 0422  NA 137 136  K 4.3 4.3  CL 105 100  CO2 24 28  GLUCOSE 200* 174*  BUN  9 13  CREATININE 0.54 0.58  CALCIUM 7.9* 8.1*   PT/INR No results for input(s): LABPROT, INR in the last 72 hours. CMP     Component Value Date/Time   NA 136 03/02/2024 0422   NA 143 01/08/2018 1605   K 4.3 03/02/2024 0422   CL 100 03/02/2024 0422   CO2 28 03/02/2024 0422   GLUCOSE 174 (H) 03/02/2024 0422   BUN 13 03/02/2024 0422   BUN 12 01/08/2018 1605   CREATININE 0.58 03/02/2024 0422   CALCIUM 8.1 (L) 03/02/2024 0422   PROT 4.1 (L) 03/01/2024 0358   ALBUMIN 1.6 (L) 03/01/2024 0358   AST 22 03/01/2024 0358   ALT 24 03/01/2024 0358   ALKPHOS 88 03/01/2024 0358   BILITOT 0.4 03/01/2024 0358   GFRNONAA >60 03/02/2024 0422   GFRAA 107 01/08/2018 1605   Lipase     Component Value Date/Time   LIPASE 33 02/19/2024 1950       Studies/Results: CT ABDOMEN PELVIS W CONTRAST Result Date: 03/01/2024 EXAM: CT ABDOMEN AND PELVIS WITH CONTRAST 02/29/2024 10:41:00 PM TECHNIQUE: CT of the abdomen and pelvis was performed with the administration of 75 mL of iohexol (OMNIPAQUE) 350 MG/ML injection. Multiplanar reformatted images are provided for review. Automated exposure control, iterative reconstruction, and/or weight-based adjustment of the mA/kV was utilized to reduce the radiation dose to as low as reasonably achievable. COMPARISON: CTA chest, abdomen and pelvis 02/20/2024, CT of abdomen and pelvis with contrast 08/31/2003. CLINICAL HISTORY: Intra-abdominal abscess. Chief complaints: Abdominal pain. Exploratory laparotomy on 02/23/2024. FINDINGS: LOWER CHEST: Bilateral symmetric minimal layering pleural effusions. Mild subsegmental  atelectasis posterior lung bases. Lung bases are otherwise clear. The cardiac size is normal. No pneumothorax. LIVER: The liver is unremarkable. GALLBLADDER AND BILE DUCTS: The gallbladder is absent. No biliary ductal dilatation. SPLEEN: No acute abnormality. PANCREAS: Partial pancreatic atrophy. No mass or ductal dilatation. ADRENAL GLANDS: No acute  abnormality. KIDNEYS, URETERS AND BLADDER: No stones in the kidneys or ureters. No hydronephrosis. No renal mass enhancement. No perinephric or periureteral stranding. Urinary bladder is unremarkable. GI AND BOWEL: The gastric wall and duodenum are unremarkable. Beginning at the ligament of Treitz, there is small bowel dilatation throughout up to 4 cm, except for normal caliber right lower quadrant segments. There are thickened and angulated segments in the mid to lower abdomen, any one of which could be transitional. The small bowel dilatation could be obstructive or due to ileus or enteritis . The appendix is normal. The wall of the colon is unremarkable. PERITONEUM AND RETROPERITONEUM: There is scattered free intraperitoneal air anteriorly in the pelvis to the left and in the midline. This is probably related to the recent surgery. Correlate clinically to exclude perforated bowel. There is slight enhancement and thickening of the peritoneal reflections along with patchy mesenteric edema in the lower abdomen and pelvis, which also could be postsurgical or inflammatory. There is a thin-walled pelvic fluid collection in between the uterus and bladder (series 4, axial 78) measuring 8.9 x 7.3 cm and 15 Hounsfield units. . The collection appears uncomplicated. This is most likely a seroma. Early presentation of abscess is possible. There is a small amount of ascites in the perihepatic space and scattered interloop ascites in the mid and lower abdomen. VASCULATURE: Aorta is normal in caliber. LYMPH NODES: No lymphadenopathy. REPRODUCTIVE ORGANS: No acute abnormality. BONES AND SOFT TISSUES: New midline laparotomy skin staples. Small subincisional fluid collection at the superior aspect measuring 3.6 x 2.7 cm and 18 Hounsfield units, probable seroma. Just below the fluid collection, there are a few tiny subcutaneous air pockets, nonspecific and could be related to the recent closure or infectious. There is diffuse  increased body wall anasarca, probably from third spacing and fluid overload. There is chronic nonerosive bilateral sacroiliitis. Degenerative change lower lumbar spine with acquired spinal stenosis of L3-L4. No acute or other significant osseous findings. IMPRESSION: 1. Small bowel dilatation up to 4 cm with thickened and angulated segments in the mid to lower abdomen that may represent transitional segments; findings may indicate small-bowel obstruction or postoperative ileus / enteritis .The small bowel is normal in caliber in the right lower quadrant. 2. Scattered free intraperitoneal air, favored postsurgical; recommend close clinical follow up and, if indicated, imaging follow-up to exclude bowel perforation. 3. Thin-walled pelvic fluid collection (8.9 x 7.3 cm, 15 HU) between the uterus and bladder, most consistent with a postoperative seroma; early abscess not excluded and short-interval follow-up is recommended. 4. Patchy mesenteric edema along with slightly enhancing peritoneal reflections, which could be postoperative etiology or inflammatory. 5. Diffuse increased body wall anasarca, likely from third spacing and fluid overload. 6. Subincisional air and fluid as described above. 7. Small amount of ascites. Electronically signed by: Francis Quam MD 03/01/2024 12:02 AM EDT RP Workstation: [id number]    Anti-infectives: Anti-infectives (From admission, onward)    Start     Dose/Rate Route Frequency Ordered Stop   03/01/24 1400  piperacillin-tazobactam (ZOSYN) IVPB 3.375 g        3.375 g 12.5 mL/hr over 240 Minutes Intravenous Every 8 hours 03/01/24 1249     02/23/24 1400  ceFAZolin (ANCEF) IVPB 1 g/50 mL premix  Status:  Discontinued        1 g 100 mL/hr over 30 Minutes Intravenous Every 8 hours 02/23/24 1222 02/25/24 1016        Assessment/Plan POD#8 - S/P Diagnostic laparoscopy, Exploratory laparotomy, Repair of small bowel enterotomy x 2, small bowel stricturoplasty by Dr. Vicenta Poli on 02/23/2024 - CT from 10/19 showed small bowel dilatation up to 4 cm with thickened and angulated segments in the mid to lower abdomen that may represent transitional segments; findings may indicate small-bowel obstruction or postoperative ileus / enteritis. The small bowel is normal in caliber in the right lower quadrant. Scattered free intraperitoneal air, favored postsurgical; recommend close clinical follow up and, if indicated, imaging follow-up to exclude bowel perforation. Thin-walled pelvic fluid collection (8.9 x 7.3 cm, 15 HU) between the uterus and bladder, most consistent with a postoperative seroma; early abscess not excluded and short-interval follow-up is recommended. Patchy mesenteric edema along with slightly enhancing peritoneal reflections, which could be postoperative etiology or inflammatory. Diffuse increased body wall anasarca, likely from third spacing and fluid overload. Subincisional air and fluid as described above. Small amount of ascites. - Zosyn initiated on 10/20. Continue IV antibiotics. CBC pending. - Continue wet-to-dry dressing BID or when soiled. - Tolerating soft diet without n/v. TPN to be decreased by 1/2. - Having BM that are more formed. Discussed adjustments to gastrointestinal agents. Patient would like to hold off. - Will continue to follow.     FEN: Soft; TPN; IVF per primary team VTE: Enoxaparin ID: Will discuss with MD antibiotics for cellulitis    LOS: 11 days   I reviewed specialist notes, nursing notes, hospitalist notes, last 24 h vitals and pain scores, last 48 h intake and output, last 24 h labs and trends, and last 24 h imaging results.   Marjorie Carlyon Favre, [facility] Surgery 03/02/2024, 12:43 PM Please see Amion for pager number during day hours 7:00am-4:30pm

## 2024-03-03 LAB — BASIC METABOLIC PANEL WITH GFR
Anion gap: 8 (ref 5–15)
BUN: 13 mg/dL (ref 8–23)
CO2: 27 mmol/L (ref 22–32)
Calcium: 7.9 mg/dL — ABNORMAL LOW (ref 8.9–10.3)
Chloride: 101 mmol/L (ref 98–111)
Creatinine, Ser: 0.7 mg/dL (ref 0.44–1.00)
GFR, Estimated: >60 mL/min (ref >60)
Glucose, Bld: 139 mg/dL — ABNORMAL HIGH (ref 70–99)
Potassium: 4.3 mmol/L (ref 3.5–5.1)
Sodium: 136 mmol/L (ref 135–145)

## 2024-03-03 LAB — CBC
HCT: 29.5 % — ABNORMAL LOW (ref 36.0–46.0)
Hemoglobin: 9.4 g/dL — ABNORMAL LOW (ref 12.0–15.0)
MCH: 28.3 pg (ref 26.0–34.0)
MCHC: 31.9 g/dL (ref 30.0–36.0)
MCV: 88.9 fL (ref 80.0–100.0)
Platelets: 229 K/uL (ref 150–400)
RBC: 3.32 MIL/uL — ABNORMAL LOW (ref 3.87–5.11)
RDW: 13.7 % (ref 11.5–15.5)
WBC: 12.5 K/uL — ABNORMAL HIGH (ref 4.0–10.5)
nRBC: 0 % (ref 0.0–0.2)

## 2024-03-03 LAB — MAGNESIUM: Magnesium: 2.2 mg/dL (ref 1.7–2.4)

## 2024-03-03 LAB — GLUCOSE, CAPILLARY
Glucose-Capillary: 261 mg/dL — ABNORMAL HIGH (ref 70–99)
Glucose-Capillary: 118 mg/dL — ABNORMAL HIGH (ref 70–99)
Glucose-Capillary: 141 mg/dL — ABNORMAL HIGH (ref 70–99)
Glucose-Capillary: 167 mg/dL — ABNORMAL HIGH (ref 70–99)

## 2024-03-03 LAB — PHOSPHORUS: Phosphorus: 4.8 mg/dL — ABNORMAL HIGH (ref 2.5–4.6)

## 2024-03-03 MED ORDER — METOPROLOL SUCCINATE ER 25 MG PO TB24
25.0000 mg | ORAL_TABLET | Freq: Every day | ORAL | Status: DC
  Administered 2024-03-03 – 2024-03-04 (×2): 25 mg via ORAL
  Filled 2024-03-03 (×2): qty 1

## 2024-03-03 MED ORDER — BUPROPION HCL ER (XL) 150 MG PO TB24
300.0000 mg | ORAL_TABLET | Freq: Every day | ORAL | Status: DC
  Administered 2024-03-03 – 2024-03-07 (×5): 300 mg via ORAL
  Filled 2024-03-03 (×5): qty 2

## 2024-03-03 MED ORDER — PANTOPRAZOLE SODIUM 40 MG PO TBEC
40.0000 mg | DELAYED_RELEASE_TABLET | Freq: Every day | ORAL | Status: DC
  Administered 2024-03-04 – 2024-03-07 (×4): 40 mg via ORAL
  Filled 2024-03-03 (×4): qty 1

## 2024-03-03 MED ORDER — FUROSEMIDE 10 MG/ML IJ SOLN
40.0000 mg | Freq: Once | INTRAMUSCULAR | Status: AC
  Administered 2024-03-03: 40 mg via INTRAVENOUS
  Filled 2024-03-03: qty 4

## 2024-03-03 MED ORDER — FLUTICASONE PROPIONATE 50 MCG/ACT NA SUSP
2.0000 | Freq: Every day | NASAL | Status: DC | PRN
Start: 2024-03-03 — End: 2024-03-07
  Administered 2024-03-05: 2 via NASAL

## 2024-03-03 NOTE — Progress Notes (Signed)
 Progress Note  9 Days Post-Op  Subjective: Tolerating soft diet. Patient reports some nausea. Denies vomiting. Had 3 bowel movements. Reports flatulence and belching. Pain is manageable.   Reports that lasix  has been helping with upper and lower edema.  ROS  All negative with the exception of above.  Objective: Vital signs in last 24 hours: Temp:  [97.9 F (36.6 C)-98.2 F (36.8 C)] 98.2 F (36.8 C) (10/22 0717) Pulse Rate:  [84-100] 84 (10/22 0717) Resp:  [18-19] 18 (10/22 0717) BP: (141-171)/(74-80) 163/75 (10/22 0717) SpO2:  [94 %-97 %] 94 % (10/22 0717) Last BM Date : 03/02/24  Intake/Output from previous day: 10/21 0701 - 10/22 0700 In: 980.4 [P.O.:360; I.V.:455.8; IV Piggyback:164.6] Out: -  Intake/Output this shift: Total I/O In: 240 [P.O.:240] Out: -   PE: General: Pleasant female who is in bedside chair in NAD. HEENT: Head is normocephalic, atraumatic.  Heart: HR normal. Lungs: Respiratory effort nonlabored. Abd: Soft, ND. Appropriate tenderness to palpation around incision. Midline incision with staples present. Some erythema present around staples that has improved. Inferior portion of wound were staples were removed has some erythema and drainage that has also improved. Wet-to-dry dressing exchanged. No rebound tenderness or guarding.  MS: Edema noted to be improved of bilateral lower and upper extremities.  Psych: A&Ox3 with an appropriate affect.    Lab Results:  Recent Labs    03/01/24 1405 03/03/24 0235  WBC 12.8* 12.5*  HGB 10.7* 9.4*  HCT 33.2* 29.5*  PLT 183 229   BMET Recent Labs    03/02/24 0422 03/03/24 0235  NA 136 136  K 4.3 4.3  CL 100 101  CO2 28 27  GLUCOSE 174* 139*  BUN 13 13  CREATININE 0.58 0.70  CALCIUM 8.1* 7.9*   PT/INR No results for input(s): LABPROT, INR in the last 72 hours. CMP     Component Value Date/Time   NA 136 03/03/2024 0235   NA 143 01/08/2018 1605   K 4.3 03/03/2024 0235   CL 101  03/03/2024 0235   CO2 27 03/03/2024 0235   GLUCOSE 139 (H) 03/03/2024 0235   BUN 13 03/03/2024 0235   BUN 12 01/08/2018 1605   CREATININE 0.70 03/03/2024 0235   CALCIUM 7.9 (L) 03/03/2024 0235   PROT 4.1 (L) 03/01/2024 0358   ALBUMIN 1.6 (L) 03/01/2024 0358   AST 22 03/01/2024 0358   ALT 24 03/01/2024 0358   ALKPHOS 88 03/01/2024 0358   BILITOT 0.4 03/01/2024 0358   GFRNONAA >60 03/03/2024 0235   GFRAA 107 01/08/2018 1605   Lipase     Component Value Date/Time   LIPASE 33 02/19/2024 1950       Studies/Results: VAS US  LOWER EXTREMITY VENOUS (DVT) Result Date: 03/02/2024  Lower Venous DVT Study Patient Name:  SHAHED YEOMAN Suits  Date of Exam:   03/02/2024 Medical Rec #: [id number]             Accession #:    [id number] Date of Birth: 06-11-1955             Patient Gender: F Patient Age:   68 years Exam Location:  [facility] Procedure:      VAS US  LOWER EXTREMITY VENOUS (DVT) Referring Phys: CHAPMAN ROTA --------------------------------------------------------------------------------  Indications: Swelling.  Risk Factors: None identified. Limitations: Body habitus and poor ultrasound/tissue interface. Comparison Study: No prior studies. Performing Technologist: Cordella Collet RVT  Examination Guidelines: A complete evaluation includes B-mode imaging, spectral Doppler, color Doppler, and power Doppler  as needed of all accessible portions of each vessel. Bilateral testing is considered an integral part of a complete examination. Limited examinations for reoccurring indications may be performed as noted. The reflux portion of the exam is performed with the patient in reverse Trendelenburg.  +---------+---------------+---------+-----------+----------+--------------+ RIGHT    CompressibilityPhasicitySpontaneityPropertiesThrombus Aging +---------+---------------+---------+-----------+----------+--------------+ CFV      Full           Yes      Yes                                  +---------+---------------+---------+-----------+----------+--------------+ SFJ      Full                                                        +---------+---------------+---------+-----------+----------+--------------+ FV Prox  Full                                                        +---------+---------------+---------+-----------+----------+--------------+ FV Mid   Full                                                        +---------+---------------+---------+-----------+----------+--------------+ FV DistalFull                                                        +---------+---------------+---------+-----------+----------+--------------+ PFV      Full                                                        +---------+---------------+---------+-----------+----------+--------------+ POP      Full           Yes      Yes                                 +---------+---------------+---------+-----------+----------+--------------+ PTV      Full                                                        +---------+---------------+---------+-----------+----------+--------------+ PERO     Full                                                        +---------+---------------+---------+-----------+----------+--------------+   +---------+---------------+---------+-----------+----------+--------------+  LEFT     CompressibilityPhasicitySpontaneityPropertiesThrombus Aging +---------+---------------+---------+-----------+----------+--------------+ CFV      Full           Yes      Yes                                 +---------+---------------+---------+-----------+----------+--------------+ SFJ      Full                                                        +---------+---------------+---------+-----------+----------+--------------+ FV Prox  Full                                                         +---------+---------------+---------+-----------+----------+--------------+ FV Mid                  Yes      Yes                                 +---------+---------------+---------+-----------+----------+--------------+ FV Distal               Yes      Yes                                 +---------+---------------+---------+-----------+----------+--------------+ PFV      Full                                                        +---------+---------------+---------+-----------+----------+--------------+ POP      Full           Yes      Yes                                 +---------+---------------+---------+-----------+----------+--------------+ PTV      Full                                                        +---------+---------------+---------+-----------+----------+--------------+ PERO     Full                                                        +---------+---------------+---------+-----------+----------+--------------+     Summary: RIGHT: - There is no evidence of deep vein thrombosis in the lower extremity. However, portions of this examination were limited- see technologist comments above.  - No cystic structure found in the popliteal fossa.  LEFT: - There is no evidence of deep vein thrombosis  in the lower extremity. However, portions of this examination were limited- see technologist comments above.  - No cystic structure found in the popliteal fossa.  *See table(s) above for measurements and observations. Electronically signed by Debby Robertson on 03/02/2024 at 2:25:20 PM.    Final     Anti-infectives: Anti-infectives (From admission, onward)    Start     Dose/Rate Route Frequency Ordered Stop   03/01/24 1400  piperacillin-tazobactam (ZOSYN) IVPB 3.375 g        3.375 g 12.5 mL/hr over 240 Minutes Intravenous Every 8 hours 03/01/24 1249     02/23/24 1400  ceFAZolin (ANCEF) IVPB 1 g/50 mL premix  Status:  Discontinued        1 g 100 mL/hr over 30  Minutes Intravenous Every 8 hours 02/23/24 1222 02/25/24 1016        Assessment/Plan POD#8 - S/P Diagnostic laparoscopy, Exploratory laparotomy, Repair of small bowel enterotomy x 2, small bowel stricturoplasty by Dr. Vicenta Poli on 02/23/2024 - CT from 10/19 showed small bowel dilatation up to 4 cm with thickened and angulated segments in the mid to lower abdomen that may represent transitional segments; findings may indicate small-bowel obstruction or postoperative ileus / enteritis. The small bowel is normal in caliber in the right lower quadrant. Scattered free intraperitoneal air, favored postsurgical; recommend close clinical follow up and, if indicated, imaging follow-up to exclude bowel perforation. Thin-walled pelvic fluid collection (8.9 x 7.3 cm, 15 HU) between the uterus and bladder, most consistent with a postoperative seroma; early abscess not excluded and short-interval follow-up is recommended. Patchy mesenteric edema along with slightly enhancing peritoneal reflections, which could be postoperative etiology or inflammatory. Diffuse increased body wall anasarca, likely from third spacing and fluid overload. Subincisional air and fluid as described above. Small amount of ascites. - Zosyn initiated on 10/20. WBC  12.5 from 12.8. Continue IV antibiotics.  - Continue wet-to-dry dressing BID or when soiled.  - Tolerating soft diet. Will discontinue TPN today. - Having Bms. Again offered adjustments to gastrointestinal agents. Patient content at this time. - Will continue to follow.    FEN: Soft; TPN to be discontinued; IVF per primary team VTE: Enoxaparin ID: Zosyn   Per [facility]: Volume overload AKI Hypokalemia/hypomagnesemia/hyperphosphatemia Prediabetes HTN H/O PVCs H/o factor V Leiden and prior DVTs Bilateral lower extre edema  -Bilateral lower extremity venous duplex completed 10/21: No evidence of DVT noted but exam was limited.   HLD GERD Anxiety/depression     LOS: 12 days   I reviewed hospitalist notes, specialist notes, nursing notes, last 24 h vitals and pain scores, last 48 h intake and output, last 24 h labs and trends, and last 24 h imaging results.   Marjorie Carlyon Favre, [facility] Surgery 03/03/2024, 11:20 AM Please see Amion for pager number during day hours 7:00am-4:30pm

## 2024-03-03 NOTE — Progress Notes (Signed)
 Mobility Specialist Progress Note:    03/03/24 1040  Mobility  Activity Ambulated with assistance  Level of Assistance Contact guard assist, steadying assist  Assistive Device Front wheel walker  Distance Ambulated (ft) 80 ft (x2)  Activity Response Tolerated well  Mobility Referral Yes  Mobility visit 1 Mobility  Mobility Specialist Start Time (ACUTE ONLY) 1024  Mobility Specialist Stop Time (ACUTE ONLY) 1040  Mobility Specialist Time Calculation (min) (ACUTE ONLY) 16 min   Pt received in BR agreeable to mobility. No physical assistance required, contact guard for safety. Pt attempted to ambulate just by holding IV pole but d/t instability pt requested also HHA. No c/o throughout. Returned to room w/o fault. Left in chair w/ call bell and personal belongings in reach. All needs met.  Thersia Minder Mobility Specialist  Please contact vis Secure Chat or  Rehab Office [phone]

## 2024-03-03 NOTE — Progress Notes (Signed)
 PROGRESS NOTE  Kristine ELIZARDO  DOB: 07/25/1955  PCP: [name] LABOR, MD FMW:[id number]  DOA: 02/19/2024  LOS: 12 days  Hospital Day: 14  Subjective: Patient was seen and examined this morning. Lying on recliner.  Feels better. Able to tolerate soft diet.  Had 3 BMs today, last one was formed. Feels anasarca improving with IV Lasix  Afebrile, heart rate in 80s mostly, blood pressure 140s to 170s overnight, breathing on room air Labs from this morning with WBC 12.5, hemoglobin 9.4, renal function normal.  Brief narrative: Kristine [name] is a 68 y.o. female with PMH significant for HTN, HLD, factor V Leiden, DVT currently not on anticoagulation, GERD, anxiety/depression,; PSH including cesarean section, cholecystectomy, tubal ligation, right oophorectomy. 10/9, patient presented to the ED with complaint of cramping abdominal pain for several hours associated with nausea. CT angio of the chest, abdomen and pelvis revealed high-grade mid small bowel obstruction within the right hemipelvis with proximal small bowel dilatation up to 3 cm and decompressed distal bowel and colon.    General Surgery was consulted Admitted to [facility] Started on conservative management for bowel obstruction with bowel rest, NG suction, IV hydration. However, did not have significant improvement despite 72 hours of conservative management. 10/13, underwent ex lap, repair of small bowel enterotomy, small bowel stricturoplasty.  Postop ileus improved with started to have high-volume diarrhea. 10/19, CT abdomen was repeated with findings as below -Small bowel dilatation up to 4 cm with thickened angulated segments -Scattered free intraperitoneal air, favored postsurgical;  -Thin-walled pelvic fluid collection (8.9 x 7.3 cm, 15 HU) between the uterus and bladder, most consistent with a postoperative seroma; early abscess not excluded and short-interval follow-up is recommended. -Patchy mesenteric edema along  with slightly enhancing peritoneal reflections, which could be postoperative etiology or inflammatory. -Diffuse increased body wall anasarca, likely from third spacing and fluid overload.  Assessment and plan: Small bowel obstruction S/p ex lap, stricturoplasty -10/13 Dr. Vernetta Presented with small bowel obstruction in the setting of multiple prior abdominal surgeries.   SBO did not resolve with several days of conservative management and ultimately required surgery Postop ileus improved but for the last 3 days, but she struggled with multiple episodes of diarrhea.   10/19, was started on a trial of Lomotil and Metamucil with some improvement 10/20, started on soft diet.  Able to tolerate.  Had 3 BMs today, last was soft. General surgery plans to wean off TPN today  Surgical site infection Postop seroma 10/19, patient was noted to have purulent drainage from abdominal incision site.  See pictures attached. CT abdomen 10/19 findings as above -showed dilated small bowel up to 4 cm, scattered free intraperitoneal air as well as a thin-walled pelvic fluid collection likely postop seroma. Patient was started on IV Zosyn General surgery following. No fever.  WBC count 12.5 Monitor temperature and WBC trend. Recent Labs  Lab 02/26/24 0512 02/27/24 0655 03/01/24 1405 03/03/24 0235  WBC 8.8 8.4 12.8* 12.5*   Volume overload status AKI - resolved Had elevated creatinine postop because of his IV fluid was given.  Patient has also been receiving TPN.  Albumin remains low.  Consequently, patient is third spacing manifested as bilateral lower extremity edema, abdominal wall edema Since 10/20, off normal saline, receiving IV Lasix  40 mg daily.  Reassess for further need tomorrow Has been Ace wrapping on both legs.   Pedal edema improving.  Recent Labs    02/22/24 0259 02/24/24 0425 02/25/24 0451 02/26/24 9487 02/27/24 9344  02/28/24 0617 02/29/24 0331 03/01/24 0358 03/02/24 0422  03/03/24 0235  BUN 12 20 25* 19 14 11  7* 9 13 13   CREATININE 0.66 1.22* 1.05* 0.76 0.58 0.52 0.47 0.54 0.58 0.70  CO2 25 21* 21* 21* 21* 22 24 24 28 27    Hypokalemia/hypomagnesemia Hyperphosphatemia Electrolyte levels being monitored regularly. Phosphorus level elevated at 4.8 today.  TPN adjustment per pharmacy Recent Labs  Lab 02/28/24 0617 02/29/24 0331 03/01/24 0358 03/02/24 0422 03/03/24 0235  K 3.4* 3.7 4.3 4.3 4.3  MG 1.9 2.0 1.9 2.1 2.2  PHOS 2.5 3.1 3.6 5.0* 4.8*   Prediabetes Hypoglycemic episode A1c 5.8 on 12/23/2023 Patient denies history of diabetes. States she was taking metformin  1000 mg daily for weight loss reason. Metformin  has been on hold while in the hospital.   While on postop ileus, dextrose drip was used to maintain blood glucose level.  Currently on TPN. Blood sugar level running controlled with insulin in TPN Diabetes care coordinator consulted as well. Continue SSI/Accu-Cheks.   Recent Labs  Lab 03/02/24 2011 03/02/24 2344 03/03/24 0400 03/03/24 0758 03/03/24 1119  GLUCAP 171* 139* 118* 141* 167*   Hypertension H/o PVCs PTA meds- Toprol  25 mg daily, Lasix  PRN Currently on IV metoprolol  5 mg every 8 hours scheduled. Blood pressure running elevated to 150s and 160s.  Partly due to pain. IV Lasix  to help. Restart oral Toprol  today   H/o factor V Leiden and prior DVTs Bilateral lower EXTR edema Was on anticoagulation for 1 year at some point  Since then, not on chronic anticoagulation  Currently on DVT prophylaxis with subcutaneous heparin. She has bilateral lower extremity edema likely from volume overload.   10/21, ultrasound duplex rule out DVT.  Ordered ACE wraps  HLD PTA meds- aspirin 81 mg daily, Zocor  10 mg daily Currently both on hold Plan to resume post discharge  GERD Continue PPI IV  Anxiety/depression PTA meds- bupropion  300 mg daily Resume today   Mobility: PT eval obtained.  Encourage ambulation PT Orders:   PT  Follow up Rec: Acute Inpatient Rehab (3hours/Day)03/02/2024 1600   Goals of care   Code Status: Full Code     DVT prophylaxis:  enoxaparin (LOVENOX) injection 40 mg Start: 02/26/24 1200 Place and maintain sequential compression device Start: 02/24/24 0043   Antimicrobials: IV Zosyn since 10/20 Fluid: TPN Consultants: General Surgery Family Communication: Husband not at bedside today  Status: Inpatient Level of care:  Telemetry Medical.    Patient is from: Home Needs to continue in-hospital care: Surgical site infection, intraabdominal collection Anticipated d/c to: Pending clinical course. CIR recommended by PT. . Diet:  Diet Order             DIET SOFT Room service appropriate? Yes; Fluid consistency: Thin  Diet effective now                   Scheduled Meds:  acetaminophen  1,000 mg Oral TID   buPROPion   300 mg Oral Daily   Chlorhexidine Gluconate Cloth  6 each Topical Daily   enoxaparin (LOVENOX) injection  40 mg Subcutaneous Q24H   furosemide   40 mg Intravenous Once   liver oil-zinc oxide   Topical QID   metoprolol  succinate  25 mg Oral Daily   [START ON 03/04/2024] pantoprazole  40 mg Oral Daily   psyllium  1 packet Oral BID   sodium chloride  flush  10-40 mL Intracatheter Q12H    PRN meds: diphenoxylate-atropine, fluticasone , hydrALAZINE, HYDROmorphone (DILAUDID) injection, liver  oil-zinc oxide, methocarbamol, prochlorperazine, sodium chloride  flush, traMADol, traMADol   Infusions:   piperacillin-tazobactam (ZOSYN)  IV 3.375 g (03/03/24 0603)   TPN ADULT (ION) 20 mL/hr at 03/03/24 1139    Antimicrobials: Anti-infectives (From admission, onward)    Start     Dose/Rate Route Frequency Ordered Stop   03/01/24 1400  piperacillin-tazobactam (ZOSYN) IVPB 3.375 g        3.375 g 12.5 mL/hr over 240 Minutes Intravenous Every 8 hours 03/01/24 1249     02/23/24 1400  ceFAZolin (ANCEF) IVPB 1 g/50 mL premix  Status:  Discontinued        1 g 100 mL/hr over  30 Minutes Intravenous Every 8 hours 02/23/24 1222 02/25/24 1016       Objective: Vitals:   03/03/24 0717 03/03/24 1100  BP: (!) 163/75 (!) 144/70  Pulse: 84 81  Resp: 18 17  Temp: 98.2 F (36.8 C) 98 F (36.7 C)  SpO2: 94% 96%    Intake/Output Summary (Last 24 hours) at 03/03/2024 1402 Last data filed at 03/03/2024 0836 Gross per 24 hour  Intake 1100.41 ml  Output --  Net 1100.41 ml    Filed Weights   02/19/24 1931 02/23/24 1337  Weight: 81.6 kg 81.6 kg   Weight change:  Body mass index is 29.5 kg/m.   Physical Exam: General exam: Very pleasant, elderly Caucasian female.  Feels a little better today. Skin: No rashes, lesions or ulcers. HEENT: Atraumatic, normocephalic, no obvious bleeding Lungs: Clear to auscultation bilaterally,  CVS: S1, S2, no murmur,   GI/Abd: Mild appropriate postop tenderness, surgical site exam per general surgery bowel sound present CNS: Alert, awake, oriented x 3 Psychiatry: Mood appropriate.  Grateful to the care Extremities: bilateral 1+ pedal edema, no calf tenderness,   Data Review: I have personally reviewed the laboratory data and studies available.  F/u labs ordered Unresulted Labs (From admission, onward)    None       Signed, Chapman Rota, MD [facility] 03/03/2024

## 2024-03-03 NOTE — Progress Notes (Signed)
 Physical Therapy Treatment Patient Details Name: Kristine [name] MRN: [id number] DOB: 01-15-1956 Today's Date: 03/03/2024   History of Present Illness Pt is a 68 y.o. female presented to [facility] ED 02/19/24 with c/o mid abdominal pain, cramp-like. CT revealed high-grade mid small bowel obstruction within the right hemipelvis with proximal small bowel dilatation up to 3 cm and decompressed distal bowel and colon. Pt s/p exploratory laparotomy with repair of small bowel enterotomy x 2 and mall bowel stricturoplasty 10/13. PMHx: factor V Leiden, DVT, gastritis, HLD, and HTN.    PT Comments  Pt up in room on arrival, agreeable to session and demonstrating continued progress towards acute goals. Pt demonstrating increased activity tolerance, progressing gait distance with IV pole and HHA with min A required to maintain balance. Pt able to complete short trial of gait without UE support however noted increased postural sway with pt fatiguing quickly. Plan to trial rollator support next session for increased stability and improved energy conservation. Continued education on importance and benefits of frequent mobilization to maximize functional mobility gains. Pt continues to benefit from skilled PT services to progress toward functional mobility goals.     If plan is discharge home, recommend the following: A little help with walking and/or transfers;A little help with bathing/dressing/bathroom;Assistance with cooking/housework;Assist for transportation;Help with stairs or ramp for entrance   Can travel by private vehicle        Equipment Recommendations  BSC/3in1    Recommendations for Other Services       Precautions / Restrictions Precautions Precautions: Fall;Other (comment) (abdominal) Recall of Precautions/Restrictions: Intact Precaution/Restrictions Comments: PICC; TPN Restrictions Weight Bearing Restrictions Per Provider Order: No     Mobility  Bed Mobility Overal bed mobility:  Needs Assistance             General bed mobility comments: pt up in room on arrival, on commode at end of session    Transfers Overall transfer level: Needs assistance Equipment used: None Transfers: Sit to/from Stand, Bed to chair/wheelchair/BSC Sit to Stand: Contact guard assist           General transfer comment: CGa for safety, reaching for external support    Ambulation/Gait Ambulation/Gait assistance: Min assist Gait Distance (Feet): 150 Feet Assistive device: 1 person hand held assist, IV Pole, None Gait Pattern/deviations: Step-through pattern, Decreased step length - right, Decreased step length - left, Decreased stride length Gait velocity: decr     General Gait Details: Pt ambulated with short slow steps with IV pole support on L and HHA on R, trial of ~20' without UE support with increased instability with ot quick to fatigue   Stairs             Wheelchair Mobility     Tilt Bed    Modified Rankin (Stroke Patients Only)       Balance Overall balance assessment: Needs assistance Sitting-balance support: No upper extremity supported, Feet supported Sitting balance-Leahy Scale: Good Sitting balance - Comments: Pt sat on BSC with back support for session.   Standing balance support: Bilateral upper extremity supported, During functional activity, Reliant on assistive device for balance Standing balance-Leahy Scale: Poor Standing balance comment: reliant on UE support                            Communication Communication Communication: No apparent difficulties  Cognition Arousal: Alert Behavior During Therapy: WFL for tasks assessed/performed   PT - Cognitive impairments: No apparent impairments  Following commands: Intact      Cueing Cueing Techniques: Verbal cues  Exercises      General Comments        Pertinent Vitals/Pain Pain Assessment Pain Assessment: Faces Faces Pain  Scale: Hurts a little bit Pain Location: Abdomen, with positional changes Pain Descriptors / Indicators: Discomfort, Grimacing, Guarding Pain Intervention(s): Monitored during session, Limited activity within patient's tolerance    Home Living                          Prior Function            PT Goals (current goals can now be found in the care plan section) Acute Rehab PT Goals Patient Stated Goal: Regain independence and safely return home PT Goal Formulation: With patient Time For Goal Achievement: 03/09/24 Progress towards PT goals: Progressing toward goals    Frequency    Min 3X/week      PT Plan      Co-evaluation              AM-PAC PT 6 Clicks Mobility   Outcome Measure  Help needed turning from your back to your side while in a flat bed without using bedrails?: A Little Help needed moving from lying on your back to sitting on the side of a flat bed without using bedrails?: A Little Help needed moving to and from a bed to a chair (including a wheelchair)?: A Little Help needed standing up from a chair using your arms (e.g., wheelchair or bedside chair)?: A Little Help needed to walk in hospital room?: A Little Help needed climbing 3-5 steps with a railing? : Total 6 Click Score: 16    End of Session   Activity Tolerance: Patient tolerated treatment well Patient left: with family/visitor present (in bathroom seated on commode) Nurse Communication: Mobility status PT Visit Diagnosis: Muscle weakness (generalized) (M62.81);Difficulty in walking, not elsewhere classified (R26.2);Other abnormalities of gait and mobility (R26.89);Unsteadiness on feet (R26.81)     Time: 8544-8492 PT Time Calculation (min) (ACUTE ONLY): 12 min  Charges:    $Gait Training: 8-22 mins PT General Charges $$ ACUTE PT VISIT: 1 Visit                     Jylan Loeza R. PTA Acute Rehabilitation Services Office: [phone]   [name] 03/03/2024, 3:44 PM

## 2024-03-03 NOTE — Progress Notes (Signed)
 Inpatient Rehab Admissions Coordinator:    CIR following. Per surgery, not yet ready for admission. I will continue to follow for potential admit once stable.  Leita Kleine, MS, CCC-SLP Rehab Admissions Coordinator  [phone] (celll) [phone] (office)

## 2024-03-03 NOTE — Plan of Care (Signed)
  Problem: Nutrition: Goal: Adequate nutrition will be maintained Outcome: Progressing   Problem: Skin Integrity: Goal: Risk for impaired skin integrity will decrease Outcome: Progressing   

## 2024-03-03 NOTE — Progress Notes (Signed)
 PHARMACY - TOTAL PARENTERAL NUTRITION CONSULT NOTE   Indication: Prolonged ileus  Patient Measurements: Height: 5' 5.5 (166.4 cm) Weight: 81.6 kg (180 lb) IBW/kg (Calculated) : 58.15 TPN AdjBW (KG): 64 Body mass index is 29.5 kg/m. Usual Weight: 82.3 kg   Assessment:  37 YOF who presented to the ED with abdominal pain and nausea found to have a SBO on CT. PMH includes FVL, DVT, HLD, HTN, C-section, cholecystectomy, oophorectomy. Surgery consulted, NGT placed, and SB protocol completed on 10/10. After several days of conservative management, the SBO was not improving and she was taken to the OR on 10/13. She has not had a BM or flatulence since surgery and has not eaten since admission. Pharmacy consulted to begin TPN.   Glucose / Insulin: hx DM, A1c 5.8; BG 118-171 since TPN hung, used 10 units SSI/24 hours + 5 units insulin in TPN Electrolytes: CoCa 8.9, Phos 4.8 others wnl Renal: BUN 9 / Scr 0.54 Hepatic: Alk Phos 88, AST/ALT wnl, Tbili 0.7, Alb 1.6, TG 193 Intake / Output; MIVF: UOP x6 charted, LBM 10/22 8am per pt (had 3-4 BMs 10/21)  GI Imaging: 10/13 DG abd portable 1V shows persistent unchanged SBO 10/19- DG abd 1 view features may reflect evolving ileus / SBO GI Surgeries / Procedures: 10/13 ExLap  Central access:  TPN start date: 10/17  Nutritional Goals: Goal TPN rate is 80 mL/hr (provides ~86 g of protein and ~1800 kcals per day)  RD Assessment: Estimated Needs Total Energy Estimated Needs: 1632-2040 kcal Total Protein Estimated Needs: 81-98 grams Total Fluid Estimated Needs: 1.6-2.0L/d  Current Nutrition:  TPN 10/21 for lunchn had 1/3 pot roast, all green breans, 3/4 rice; for dinner had chicken strip, 1/4 bun, some mac and cheese, some carrots and green beans 10/22 not feeling hungry so only had one cereal with milk, 1/4 biscuit and 1 bite of egg. Has not  tried ensure due to concern for diarrhea  Plan:  Discussed with surgery who agrees with stopping TPN.  Taper to 20 ml/hr at 1600 then stop at 1800.  Stop SSI and BG checks   Thank you for allowing pharmacy to be a part of this patient's care.  Jinnie Door, PharmD, BCPS, BCCP Clinical Pharmacist  Please check AMION for all [facility] Pharmacy phone numbers After 10:00 PM, call Main Pharmacy [phone]

## 2024-03-04 ENCOUNTER — Inpatient Hospital Stay (HOSPITAL_COMMUNITY)

## 2024-03-04 DIAGNOSIS — K56609 Unspecified intestinal obstruction, unspecified as to partial versus complete obstruction: Secondary | ICD-10-CM | POA: Diagnosis not present

## 2024-03-04 LAB — CBC
HCT: 32.7 % — ABNORMAL LOW (ref 36.0–46.0)
Hemoglobin: 10.4 g/dL — ABNORMAL LOW (ref 12.0–15.0)
MCH: 28.6 pg (ref 26.0–34.0)
MCHC: 31.8 g/dL (ref 30.0–36.0)
MCV: 89.8 fL (ref 80.0–100.0)
Platelets: 310 K/uL (ref 150–400)
RBC: 3.64 MIL/uL — ABNORMAL LOW (ref 3.87–5.11)
RDW: 13.7 % (ref 11.5–15.5)
WBC: 11.5 K/uL — ABNORMAL HIGH (ref 4.0–10.5)
nRBC: 0 % (ref 0.0–0.2)

## 2024-03-04 MED ORDER — IOHEXOL 350 MG/ML SOLN
75.0000 mL | Freq: Once | INTRAVENOUS | Status: AC | PRN
Start: 2024-03-04 — End: 2024-03-04
  Administered 2024-03-04: 75 mL via INTRAVENOUS

## 2024-03-04 NOTE — Progress Notes (Signed)
 Kristine [name]  FMW:[id number] DOB: 02-16-1956 DOA: 02/19/2024 PCP: [name] LABOR, MD    Brief Narrative:  68 year old retired [employer] fund[employer] with a history of HTN, HLD, factor V Leiden mutation, DVT, GERD, anxiety/depression, cesarean section, cholecystectomy, tubal ligation, and right oophorectomy who presented to the ED 10/9 with the acute onset of cramping generalized abdominal pain with unrelenting nausea.  CT revealed a high-grade mid small bowel obstruction.  General surgery evaluated the patient and she was admitted by TR[facility].  Following admission she unfortunately failed conservative therapy.  On 10/13 she underwent exploratory laparotomy with repair of a small bowel enterotomy as well as a small bowel stricturoplasty.  Her postoperative course was complicated by a stubborn ileus.  Follow-up CT 10/19 noted small bowel dilatation up to 4 cm with thick and angulated segments and a thin walled pelvic fluid collection between the uterus and bladder consistent with a postoperative seroma as well as generalized anasarca of the body wall.  Goals of Care:   Code Status: Full Code   DVT prophylaxis: enoxaparin (LOVENOX) injection 40 mg Start: 02/26/24 1200 Place and maintain sequential compression device Start: 02/24/24 0043   Interim Hx: No acute events recorded overnight.  The patient is afebrile.  Vital signs are stable.  CBG is well-controlled.  Renal function is normal.  WBC trending downward.  In good spirits.  No new complaints today at the time of my evaluation.  Assessment & Plan:  Small bowel obstruction Status post exploratory laparotomy with stricturoplasty 10/13 after failed to improve with conservative therapy -postoperative care per General Surgery  Postoperative ileus With significant voluminous diarrhea -has improved with Imodium and Metamucil -was able to start a soft diet 10/20  Surgical site infection On 10/19 the patient was found to have purulent drainage from her abdominal  incision site - being treated with broad IV antibiotics -management per general surgery with plans for follow-up CT abdomen/pelvis today  Postoperative seroma Noted on CT scan 10/19 - for reevaluation with CT imaging today  Anasarca Due to low albumin in the setting of necessary volume expansion due to acute kidney injury -has responded well to cessation of IV fluid and diuretic  Hypokalemia Corrected with supplementation  Hypomagnesemia Corrected with supplementation  Prediabetes A1c 5.8 12/23/2023 -no known prior history of diabetes -was on metformin  prior to admission for possible metabolic syndrome  HTN Blood pressure mildly elevated, likely related to pain -monitor without change for now  Factor V Leiden mutation with prior DVTs Previously treated with 1 year of anticoagulation - no DVT on bilateral lower extremity venous duplex 10/21  HLD On Zocor  prior to this admission - holding due to limited intake  GERD Continue PPI  Anxiety/depression Continue usual bupropion  dose  Nutrition Problem: Moderate Malnutrition Etiology: acute illness (SBO/post op ileus) Signs/Symptoms: energy intake < or equal to 50% for > or equal to 5 days, mild muscle depletion Interventions: TPN     Family Communication: No family present at time of exam Disposition:   Acute Inpatient Rehab (3hours/Day)03/03/2024 1500  Objective: Blood pressure 125/78, pulse 81, temperature 98.3 F (36.8 C), temperature source Oral, resp. rate 16, height 5' 5.5 (1.664 m), weight 81.6 kg, SpO2 98%.  Intake/Output Summary (Last 24 hours) at 03/04/2024 0754 Last data filed at 03/04/2024 0133 Gross per 24 hour  Intake 340 ml  Output --  Net 340 ml   Filed Weights   02/19/24 1931 02/23/24 1337  Weight: 81.6 kg 81.6 kg    Examination: General: No  acute respiratory distress Lungs: Clear to auscultation bilaterally without wheezes or crackles Cardiovascular: Regular rate and rhythm without murmur  gallop or rub normal S1 and S2 Abdomen: soft, bowel sounds positive, no rebound Extremities: 1+ bilateral lower extremity edema without cyanosis  CBC: Recent Labs  Lab 02/27/24 0655 03/01/24 1405 03/03/24 0235  WBC 8.4 12.8* 12.5*  NEUTROABS 6.6  --   --   HGB 11.2* 10.7* 9.4*  HCT 34.5* 33.2* 29.5*  MCV 87.6 88.5 88.9  PLT 149* 183 229   Basic Metabolic Panel: Recent Labs  Lab 03/01/24 0358 03/02/24 0422 03/03/24 0235  NA 137 136 136  K 4.3 4.3 4.3  CL 105 100 101  CO2 24 28 27   GLUCOSE 200* 174* 139*  BUN 9 13 13   CREATININE 0.54 0.58 0.70  CALCIUM 7.9* 8.1* 7.9*  MG 1.9 2.1 2.2  PHOS 3.6 5.0* 4.8*   GFR: Estimated Creatinine Clearance: 70.8 mL/min (by C-G formula based on SCr of 0.7 mg/dL).   Scheduled Meds:  acetaminophen  1,000 mg Oral TID   buPROPion   300 mg Oral Daily   Chlorhexidine Gluconate Cloth  6 each Topical Daily   enoxaparin (LOVENOX) injection  40 mg Subcutaneous Q24H   liver oil-zinc oxide   Topical QID   metoprolol  succinate  25 mg Oral Daily   pantoprazole  40 mg Oral Daily   psyllium  1 packet Oral BID   sodium chloride  flush  10-40 mL Intracatheter Q12H   Continuous Infusions:  piperacillin-tazobactam (ZOSYN)  IV 3.375 g (03/04/24 0550)     LOS: 13 days   Reyes IVAR Moores, MD [facility] Office  [phone] Pager - Text Page per Tracey  If 7PM-7AM, please contact night-coverage per Amion 03/04/2024, 7:54 AM

## 2024-03-04 NOTE — Progress Notes (Signed)
 Nutrition Follow-up  DOCUMENTATION CODES:   Non-severe (moderate) malnutrition in context of acute illness/injury  INTERVENTION:  GI Soft diet as tolerated, encourage PO intake Offer ONS if patient requests  Continue metamucil, adjust dosage as needed    NUTRITION DIAGNOSIS:   Moderate Malnutrition related to acute illness (SBO/post op ileus) as evidenced by energy intake < or equal to 50% for > or equal to 5 days, mild muscle depletion.  GOAL:   Patient will meet greater than or equal to 90% of their needs   MONITOR:   Other (Comment), Labs, PO intake (TPN)  REASON FOR ASSESSMENT:   Consult New TPN/TNA  ASSESSMENT:   PMH significant for HTN, HLD, factor V Leiden, DVT currently not on anticoagulation, GERD, anxiety/depression,; PSH including cesarean section, cholecystectomy, tubal ligation, right oophorectomy. Presented to the ED with complaint of cramping abdominal pain for several hours associated with nausea.  10/13 - S/p ex lap, stricturoplasty  10/17- TPN started 10/22 - TPN stopped   Patient seen in room. TPN discontinued yesterday. Pt continues on GI soft diet, reports some nausea this morning and was given meds. She ate a bagel and half a container of cheerios for breakfast. Discussed adding ensure to diet order, patient declined due to resumption of diarrhea. Metamucil ordered. Informed patient ensure is available on unit if she would like to try or plant based ONS can be ordered.   Admit weight: 81.6 kg  Current weight: 81.6 kg  Nutritionally Relevant Medications: metamucil   Labs Reviewed: Glucose 139, calcium 7.9, Phos 4.8, CBG 118-213   Lines/Drains:  PICC double lumen    NUTRITION - FOCUSED PHYSICAL EXAM:  Flowsheet Row Most Recent Value  Orbital Region No depletion  Upper Arm Region No depletion  Thoracic and Lumbar Region No depletion  Buccal Region No depletion  Temple Region Mild depletion  Clavicle Bone Region Mild depletion  Clavicle  and Acromion Bone Region No depletion  Scapular Bone Region No depletion  Dorsal Hand No depletion  Patellar Region No depletion  Anterior Thigh Region No depletion  Posterior Calf Region No depletion  Hair Reviewed  Eyes Reviewed  Mouth Reviewed  Skin Reviewed  Nails Reviewed    Diet Order:   Diet Order             DIET SOFT Room service appropriate? Yes; Fluid consistency: Thin  Diet effective now                   EDUCATION NEEDS:   Education needs have been addressed  Skin:  Skin Assessment: Skin Integrity Issues: Skin Integrity Issues:: Incisions Incisions: abdomen  Last BM:  10/23  Height:   Ht Readings from Last 1 Encounters:  02/23/24 5' 5.5 (1.664 m)    Weight:   Wt Readings from Last 1 Encounters:  02/23/24 81.6 kg    Ideal Body Weight:  56.8 kg  BMI:  Body mass index is 29.5 kg/m.  Estimated Nutritional Needs:   Kcal:  8367-7959 kcal  Protein:  81-98 grams  Fluid:  1.6-2.0L/d  Madalyn Potters, MS, RD, LDN Clinical Dietitian  Contact via secure chat. If unavailable, use group chat RD Inpatient.

## 2024-03-04 NOTE — Progress Notes (Signed)
 Progress Note  10 Days Post-Op  Subjective: Tolerating soft diet. Having frequent stools that are now semi-solid (BMx7 last 24h). Reports a new intermittent LLQ discomfort. During my exam this was elicited when I probed in the inferior aspect of her wound.   ROS  All negative with the exception of above.  Objective: Vital signs in last 24 hours: Temp:  [98.1 F (36.7 C)-98.4 F (36.9 C)] 98.2 F (36.8 C) (10/23 0803) Pulse Rate:  [81-95] 81 (10/23 0803) Resp:  [16-18] 17 (10/23 0803) BP: (125-173)/(71-88) 145/73 (10/23 0803) SpO2:  [97 %-98 %] 98 % (10/23 0803) Last BM Date : 03/03/24  Intake/Output from previous day: 10/22 0701 - 10/23 0700 In: 340 [P.O.:340] Out: -  Intake/Output this shift: No intake/output data recorded.  PE: General: Pleasant female who is in bedside chair in NAD. HEENT: Head is normocephalic, atraumatic.  Heart: HR normal. Lungs: Respiratory effort nonlabored.  Abd: Soft, ND. Midline incision stable compared to yesterday without cellulitis- dressing changed by me. There is purulence pooling in the inferior aspect of her wound. Gentle probing of wound was performed. Wound is deep, I do not confidently feel fascial sutures at the inferior-most aspect of the wound base but there is not wide dehiscence of the fascia.  MS: Edema noted to be improved of bilateral lower and upper extremities.  Psych: A&Ox3 with an appropriate affect.    Lab Results:  Recent Labs    03/01/24 1405 03/03/24 0235  WBC 12.8* 12.5*  HGB 10.7* 9.4*  HCT 33.2* 29.5*  PLT 183 229   BMET Recent Labs    03/02/24 0422 03/03/24 0235  NA 136 136  K 4.3 4.3  CL 100 101  CO2 28 27  GLUCOSE 174* 139*  BUN 13 13  CREATININE 0.58 0.70  CALCIUM 8.1* 7.9*   PT/INR No results for input(s): LABPROT, INR in the last 72 hours. CMP     Component Value Date/Time   NA 136 03/03/2024 0235   NA 143 01/08/2018 1605   K 4.3 03/03/2024 0235   CL 101 03/03/2024 0235    CO2 27 03/03/2024 0235   GLUCOSE 139 (H) 03/03/2024 0235   BUN 13 03/03/2024 0235   BUN 12 01/08/2018 1605   CREATININE 0.70 03/03/2024 0235   CALCIUM 7.9 (L) 03/03/2024 0235   PROT 4.1 (L) 03/01/2024 0358   ALBUMIN 1.6 (L) 03/01/2024 0358   AST 22 03/01/2024 0358   ALT 24 03/01/2024 0358   ALKPHOS 88 03/01/2024 0358   BILITOT 0.4 03/01/2024 0358   GFRNONAA >60 03/03/2024 0235   GFRAA 107 01/08/2018 1605   Lipase     Component Value Date/Time   LIPASE 33 02/19/2024 1950       Studies/Results: VAS US  LOWER EXTREMITY VENOUS (DVT) Result Date: 03/02/2024  Lower Venous DVT Study Patient Name:  Kristine [name]  Date of Exam:   03/02/2024 Medical Rec #: [id number]             Accession #:    [id number] Date of Birth: 12/26/1955             Patient Gender: F Patient Age:   68 years Exam Location:  [facility] Procedure:      VAS US  LOWER EXTREMITY VENOUS (DVT) Referring Phys: CHAPMAN ROTA --------------------------------------------------------------------------------  Indications: Swelling.  Risk Factors: None identified. Limitations: Body habitus and poor ultrasound/tissue interface. Comparison Study: No prior studies. Performing Technologist: Cordella Collet RVT  Examination Guidelines: A complete evaluation includes  B-mode imaging, spectral Doppler, color Doppler, and power Doppler as needed of all accessible portions of each vessel. Bilateral testing is considered an integral part of a complete examination. Limited examinations for reoccurring indications may be performed as noted. The reflux portion of the exam is performed with the patient in reverse Trendelenburg.  +---------+---------------+---------+-----------+----------+--------------+ RIGHT    CompressibilityPhasicitySpontaneityPropertiesThrombus Aging +---------+---------------+---------+-----------+----------+--------------+ CFV      Full           Yes      Yes                                  +---------+---------------+---------+-----------+----------+--------------+ SFJ      Full                                                        +---------+---------------+---------+-----------+----------+--------------+ FV Prox  Full                                                        +---------+---------------+---------+-----------+----------+--------------+ FV Mid   Full                                                        +---------+---------------+---------+-----------+----------+--------------+ FV DistalFull                                                        +---------+---------------+---------+-----------+----------+--------------+ PFV      Full                                                        +---------+---------------+---------+-----------+----------+--------------+ POP      Full           Yes      Yes                                 +---------+---------------+---------+-----------+----------+--------------+ PTV      Full                                                        +---------+---------------+---------+-----------+----------+--------------+ PERO     Full                                                        +---------+---------------+---------+-----------+----------+--------------+   +---------+---------------+---------+-----------+----------+--------------+  LEFT     CompressibilityPhasicitySpontaneityPropertiesThrombus Aging +---------+---------------+---------+-----------+----------+--------------+ CFV      Full           Yes      Yes                                 +---------+---------------+---------+-----------+----------+--------------+ SFJ      Full                                                        +---------+---------------+---------+-----------+----------+--------------+ FV Prox  Full                                                         +---------+---------------+---------+-----------+----------+--------------+ FV Mid                  Yes      Yes                                 +---------+---------------+---------+-----------+----------+--------------+ FV Distal               Yes      Yes                                 +---------+---------------+---------+-----------+----------+--------------+ PFV      Full                                                        +---------+---------------+---------+-----------+----------+--------------+ POP      Full           Yes      Yes                                 +---------+---------------+---------+-----------+----------+--------------+ PTV      Full                                                        +---------+---------------+---------+-----------+----------+--------------+ PERO     Full                                                        +---------+---------------+---------+-----------+----------+--------------+     Summary: RIGHT: - There is no evidence of deep vein thrombosis in the lower extremity. However, portions of this examination were limited- see technologist comments above.  - No cystic structure found in the popliteal fossa.  LEFT: - There is no evidence of deep vein thrombosis  in the lower extremity. However, portions of this examination were limited- see technologist comments above.  - No cystic structure found in the popliteal fossa.  *See table(s) above for measurements and observations. Electronically signed by Debby Robertson on 03/02/2024 at 2:25:20 PM.    Final     Anti-infectives: Anti-infectives (From admission, onward)    Start     Dose/Rate Route Frequency Ordered Stop   03/01/24 1400  piperacillin-tazobactam (ZOSYN) IVPB 3.375 g        3.375 g 12.5 mL/hr over 240 Minutes Intravenous Every 8 hours 03/01/24 1249     02/23/24 1400  ceFAZolin (ANCEF) IVPB 1 g/50 mL premix  Status:  Discontinued        1 g 100 mL/hr over 30  Minutes Intravenous Every 8 hours 02/23/24 1222 02/25/24 1016        Assessment/Plan POD#10 - S/P Diagnostic laparoscopy, Exploratory laparotomy, Repair of small bowel enterotomy x 2, small bowel stricturoplasty by Dr. Vicenta Poli on 02/23/2024 - CT from 10/19 showed small bowel dilatation up to 4 cm  pSBO vs ileus. Scattered free intraperitoneal air, favored postsurgical; Thin-walled pelvic fluid collection (8.9 x 7.3 cm, 15 HU) between the uterus and bladder, most consistent with a postoperative seroma; early abscess not excluded and short-interval follow-up is recommended. +asasarca  - Zosyn initiated on 10/20. WBC is pending today. Unclear if the purulence in the wound is from wound infection alone vs a pelvic abscess that is decompressing via her open midline wound. I also suspect pelvic fluid may be contributing to her frequent stools. Plan CT abdomen and pelvis today to further evaluate. If abscess present I will consult IR for drainage. - Continue wet-to-dry dressing BID or when soiled.  - Tolerating soft diet.  - Having Bms. Again offered adjustments to gastrointestinal agents. Patient content at this time. - Will continue to follow.    FEN: Soft; TPN to be discontinued; IVF per primary team VTE: Enoxaparin ID: Zosyn   Per TRH: Volume overload AKI Hypokalemia/hypomagnesemia/hyperphosphatemia Prediabetes HTN H/O PVCs H/o factor V Leiden and prior DVTs Bilateral lower extre edema  -Bilateral lower extremity venous duplex completed 10/21: No evidence of DVT noted but exam was limited.   HLD GERD Anxiety/depression    LOS: 13 days   I reviewed hospitalist notes, specialist notes, nursing notes, last 24 h vitals and pain scores, last 48 h intake and output, last 24 h labs and trends, and last 24 h imaging results.   Kristine GORMAN Pringle, PA-C  [facility] 03/04/2024, 11:04 AM Please see Amion for pager number during day hours 7:00am-4:30pm

## 2024-03-04 NOTE — Progress Notes (Signed)
 Occupational Therapy Treatment Patient Details Name: Kristine [name] MRN: [id number] DOB: 01-Jan-1956 Today's Date: 03/04/2024   History of present illness Pt is a 68 y.o. female presented to [facility] ED 02/19/24 with c/o mid abdominal pain, cramp-like. CT revealed high-grade mid small bowel obstruction within the right hemipelvis with proximal small bowel dilatation up to 3 cm and decompressed distal bowel and colon. Pt s/p exploratory laparotomy with repair of small bowel enterotomy x 2 and mall bowel stricturoplasty 10/13. PMHx: factor V Leiden, DVT, gastritis, HLD, and HTN.   OT comments  Pt is progressing towards functional goals well this session. Focus of session on increasing OOB activity tolerance, independence in ADL tasks, and functional mobility. Pt tolerated tx session well, required CGA for functional transfers and ADL tasks standing at sink. Pt engaged in functional mobility with RW and rollator, expressing preference for RW. Pt continues to require up to Max A LB ADL engagement as she is primarily limited by abdominal pain, unsteadiness on feet, and decreased activity tolerance/endurance. OT to continue per POC to facilitate progress towards goals.  Current d/c recommendations for >3hrs/day of skilled therapies to allow for increased activity tolerance/safety during mobility/adls prior to home remains appropriate.       If plan is discharge home, recommend the following:  A little help with walking and/or transfers;A little help with bathing/dressing/bathroom;Assistance with cooking/housework;Assistance with feeding;Assist for transportation;Help with stairs or ramp for entrance   Equipment Recommendations  None recommended by OT    Recommendations for Other Services      Precautions / Restrictions Precautions Precautions: Fall;Other (comment) (abdominal) Recall of Precautions/Restrictions: Intact Restrictions Weight Bearing Restrictions Per Provider Order: No        Mobility Bed Mobility               General bed mobility comments: Pt greeted in recliner and returned to recliner.    Transfers Overall transfer level: Needs assistance Equipment used: Rolling walker (2 wheels), Rollator (4 wheels) Transfers: Sit to/from Stand Sit to Stand: Contact guard assist           General transfer comment: Pt required CGA for safety and brief steadying assist. Pt utilized RW and rollator during session, expressing preference for RW as she felt more stability as compared to rollator. Pt engaged in functional ambulation around entire unit to facilitate OOB activity tolerance. Pt initiated recovery breaks throughout activity and therapist encouraged Pt to engage in deep breathing strategies to manage abdominal pain. Therapist monitored Pt response to tx tasks and adjusted activity demands as appropriate.     Balance Overall balance assessment: Needs assistance Sitting-balance support: No upper extremity supported Sitting balance-Leahy Scale: Good Sitting balance - Comments: Pt sat on toilet with no back support.   Standing balance support: Bilateral upper extremity supported, During functional activity, Reliant on assistive device for balance Standing balance-Leahy Scale: Poor Standing balance comment: Reliant on BUE support                           ADL either performed or assessed with clinical judgement   ADL Overall ADL's : Needs assistance/impaired     Grooming: Wash/dry hands;Oral care;Contact guard assist;Standing Grooming Details (indicate cue type and reason): Completed grooming tasks while standing at sink with CGA. Noted leaning against counter for support.             Lower Body Dressing: Maximal assistance Lower Body Dressing Details (indicate cue type and reason): Max  A don/doff socks while seated in recliner d/t abdominal pain Toilet Transfer: Contact guard assist;Ambulation;Comfort height toilet Toilet Transfer  Details (indicate cue type and reason): Pt furniture walked with CGA to toilet Toileting- Clothing Manipulation and Hygiene: Contact guard assist;Sitting/lateral lean         General ADL Comments: Pt with improved engagement in ADL tasks this date, completing ADL routine in bathroom.    Extremity/Trunk Assessment Upper Extremity Assessment Upper Extremity Assessment: Generalized weakness            Vision       Perception     Praxis     Communication Communication Communication: No apparent difficulties   Cognition Arousal: Alert Behavior During Therapy: WFL for tasks assessed/performed Cognition: No apparent impairments                               Following commands: Intact        Cueing   Cueing Techniques: Verbal cues  Exercises      Shoulder Instructions       General Comments Pt educated on energy conservation strategies. Pt verbalized understanding.    Pertinent Vitals/ Pain       Pain Assessment Pain Assessment: Faces Faces Pain Scale: Hurts little more Pain Location: abdomen, with positional changes Pain Descriptors / Indicators: Discomfort, Grimacing, Guarding Pain Intervention(s): Limited activity within patient's tolerance, Monitored during session  Home Living                                          Prior Functioning/Environment              Frequency  Min 2X/week        Progress Toward Goals  OT Goals(current goals can now be found in the care plan section)  Progress towards OT goals: Progressing toward goals  Acute Rehab OT Goals Patient Stated Goal: To walk OT Goal Formulation: With patient Time For Goal Achievement: 03/10/24 Potential to Achieve Goals: Good ADL Goals Pt Will Perform Grooming: with set-up;standing Pt Will Perform Lower Body Bathing: with contact guard assist;sitting/lateral leans Pt Will Perform Lower Body Dressing: with contact guard assist;sitting/lateral leans Pt  Will Transfer to Toilet: with modified independence;ambulating Additional ADL Goal #1: Pt will engage in ADL routine in bathroom for at least 10 minutes with Mod I and self-initiating rest breaks as needed.  Plan      Co-evaluation                 AM-PAC OT 6 Clicks Daily Activity     Outcome Measure   Help from another person eating meals?: A Little Help from another person taking care of personal grooming?: A Little Help from another person toileting, which includes using toliet, bedpan, or urinal?: A Little Help from another person bathing (including washing, rinsing, drying)?: A Lot Help from another person to put on and taking off regular upper body clothing?: A Little Help from another person to put on and taking off regular lower body clothing?: A Lot 6 Click Score: 16    End of Session Equipment Utilized During Treatment: Gait belt;Rolling walker (2 wheels);Rollator (4 wheels)  OT Visit Diagnosis: Unsteadiness on feet (R26.81);Muscle weakness (generalized) (M62.81);Pain Pain - part of body:  (abdomen)   Activity Tolerance Patient tolerated treatment well   Patient Left in chair;with  call bell/phone within reach;with family/visitor present   Nurse Communication          Time: 1339-1401 OT Time Calculation (min): 22 min  Charges: OT General Charges $OT Visit: 1 Visit OT Treatments $Therapeutic Activity: 8-22 mins  Maurilio CROME, OTR/L.  [facility] Acute Rehabilitation  Office: [phone]   Maurilio PARAS Randon Somera 03/04/2024, 2:22 PM

## 2024-03-05 ENCOUNTER — Inpatient Hospital Stay (HOSPITAL_COMMUNITY)

## 2024-03-05 DIAGNOSIS — K56609 Unspecified intestinal obstruction, unspecified as to partial versus complete obstruction: Secondary | ICD-10-CM | POA: Diagnosis not present

## 2024-03-05 LAB — COMPREHENSIVE METABOLIC PANEL WITH GFR
ALT: 20 U/L (ref 0–44)
AST: 20 U/L (ref 15–41)
Albumin: 2 g/dL — ABNORMAL LOW (ref 3.5–5.0)
Alkaline Phosphatase: 103 U/L (ref 38–126)
Anion gap: 9 (ref 5–15)
BUN: 7 mg/dL — ABNORMAL LOW (ref 8–23)
CO2: 25 mmol/L (ref 22–32)
Calcium: 8.2 mg/dL — ABNORMAL LOW (ref 8.9–10.3)
Chloride: 105 mmol/L (ref 98–111)
Creatinine, Ser: 0.8 mg/dL (ref 0.44–1.00)
GFR, Estimated: >60 mL/min (ref >60)
Glucose, Bld: 136 mg/dL — ABNORMAL HIGH (ref 70–99)
Potassium: 3.6 mmol/L (ref 3.5–5.1)
Sodium: 139 mmol/L (ref 135–145)
Total Bilirubin: 0.5 mg/dL (ref 0.0–1.2)
Total Protein: 4.1 g/dL — ABNORMAL LOW (ref 6.5–8.1)

## 2024-03-05 LAB — CBC
HCT: 30.4 % — ABNORMAL LOW (ref 36.0–46.0)
Hemoglobin: 9.7 g/dL — ABNORMAL LOW (ref 12.0–15.0)
MCH: 28.4 pg (ref 26.0–34.0)
MCHC: 31.9 g/dL (ref 30.0–36.0)
MCV: 89.1 fL (ref 80.0–100.0)
Platelets: 348 K/uL (ref 150–400)
RBC: 3.41 MIL/uL — ABNORMAL LOW (ref 3.87–5.11)
RDW: 13.8 % (ref 11.5–15.5)
WBC: 9.8 K/uL (ref 4.0–10.5)
nRBC: 0 % (ref 0.0–0.2)

## 2024-03-05 LAB — PROTIME-INR
INR: 1 (ref 0.8–1.2)
Prothrombin Time: 13.5 s (ref 11.4–15.2)

## 2024-03-05 LAB — MAGNESIUM: Magnesium: 2 mg/dL (ref 1.7–2.4)

## 2024-03-05 MED ORDER — FENTANYL CITRATE (PF) 100 MCG/2ML IJ SOLN
INTRAMUSCULAR | Status: AC | PRN
  Administered 2024-03-05 (×3): 50 ug via INTRAVENOUS

## 2024-03-05 MED ORDER — LIDOCAINE 1 % OPTIME INJ - NO CHARGE
10.0000 mL | Freq: Once | INTRAMUSCULAR | Status: AC
  Administered 2024-03-05: 10 mL via INTRADERMAL

## 2024-03-05 MED ORDER — ALPRAZOLAM 0.25 MG PO TABS
0.2500 mg | ORAL_TABLET | Freq: Every day | ORAL | Status: DC
  Administered 2024-03-05 – 2024-03-06 (×2): 0.25 mg via ORAL
  Filled 2024-03-05 (×2): qty 1

## 2024-03-05 MED ORDER — SODIUM CHLORIDE 0.9% FLUSH
5.0000 mL | Freq: Three times a day (TID) | INTRAVENOUS | Status: DC
  Administered 2024-03-05 – 2024-03-07 (×7): 5 mL

## 2024-03-05 MED ORDER — FENTANYL CITRATE (PF) 100 MCG/2ML IJ SOLN
INTRAMUSCULAR | Status: AC
  Filled 2024-03-05: qty 4

## 2024-03-05 MED ORDER — FUROSEMIDE 20 MG PO TABS
20.0000 mg | ORAL_TABLET | Freq: Every day | ORAL | Status: DC
  Administered 2024-03-05 – 2024-03-07 (×3): 20 mg via ORAL
  Filled 2024-03-05 (×3): qty 1

## 2024-03-05 MED ORDER — MIDAZOLAM HCL 2 MG/2ML IJ SOLN
INTRAMUSCULAR | Status: AC
  Filled 2024-03-05: qty 4

## 2024-03-05 MED ORDER — METOPROLOL SUCCINATE ER 50 MG PO TB24
50.0000 mg | ORAL_TABLET | Freq: Every day | ORAL | Status: DC
  Administered 2024-03-05 – 2024-03-06 (×2): 50 mg via ORAL
  Filled 2024-03-05 (×2): qty 1

## 2024-03-05 MED ORDER — LACTATED RINGERS IV SOLN: INTRAVENOUS | Status: DC

## 2024-03-05 MED ORDER — ACETAMINOPHEN 500 MG PO TABS
1000.0000 mg | ORAL_TABLET | Freq: Once | ORAL | Status: AC
  Administered 2024-03-05: 1000 mg via ORAL
  Filled 2024-03-05: qty 2

## 2024-03-05 MED ORDER — MIDAZOLAM HCL (PF) 2 MG/2ML IJ SOLN
INTRAMUSCULAR | Status: AC | PRN
  Administered 2024-03-05: .5 mg via INTRAVENOUS
  Administered 2024-03-05 (×2): 1 mg via INTRAVENOUS

## 2024-03-05 NOTE — Consult Note (Signed)
 Chief Complaint: Pelvic Fluid Collection  Referring Provider(s): Almarie Pringle, PA ([facility])  Supervising Physician: Jenna Hacker  Patient Status: [facility] - In-pt  History of Present Illness: Kristine [name] is a 68 y.o. female with PMH significant for HTN, HLD, factor V Leiden mutation, DVT, GERD, cesarean section, cholecystectomy, tubal ligation, and right oophorectomy. She presented to the ED 02/19/24 for nausea and abd pain with subsequent imaging revealing SBO. Conservative therapy failed, and she underwent exploratory laparotomy with repair of a small bowel enterotomy as well as a small bowel stricturoplasty on 10/13. Follow up CT 10/19 showed pelvic fluid collection which remains on 10/23 CT (11.9 x 6.2 x 3.6 cm, seroma vs early abscess). Surgical team expresses concern that collection may be contributing to frequent stools or that it may be decompressing via open midline wound. IR consulted for drain placement.    Denies fever, chills, SOB, CP, N/V,  blood in stool or urine. Endorses lower abd pain, frequent loose stools, leg swelling (improved since lasix ). She is sitting up in chair using incentive spirometer with her spouse at bedside, pleasant, interested in her care.   Allergies Reviewed:  Oxycodone-acetaminophen, Paroxetine, Sulfa antibiotics, Amoxicillin-pot clavulanate, Calcium, Erythromycin, Guaifenesin, Naproxen, Oxycodone-aspirin, and Sertraline   Patient is Full Code  Past Medical History:  Diagnosis Date   Anxiety    Arthritis 2025   Toe/knee   Basal cell carcinoma    face   Cataract 2019   Clotting disorder 2009   Factor 5   Depression 1989   DVT (deep venous thrombosis) (HCC) 05/13/2006   Esophageal erosions    Factor V Leiden    On coag x 1 year ending in 09   Gastritis 05/13/1988   hospital- abd pain   GERD (gastroesophageal reflux disease)    Hx of adenomatous polyp of colon 12/06/2022   5 mm rectal adenoma repeat colonoscopy 2031     Hyperlipidemia    Hypertension    Radial head fracture 05/13/2009    Past Surgical History:  Procedure Laterality Date   BIOPSY BREAST Right 10/2022   BREAST SURGERY  2006   Ductal Papilloma/2023 breast biopsy-benign/2024 breast biopsy -benign   CESAREAN SECTION  1987   CHOLECYSTECTOMY  01/27/2003   COLONOSCOPY     DILATION AND CURETTAGE OF UTERUS  08/22/2022   LAPAROSCOPY N/A 02/23/2024   Procedure: LAPAROSCOPY, DIAGNOSTIC;  Surgeon: Vernetta Berg, MD;  Location: [facility] OR;  Service: General;  Laterality: N/A;   LAPAROTOMY N/A 02/23/2024   Procedure: LAPAROTOMY, EXPLORATORY;  Surgeon: Vernetta Berg, MD;  Location: [facility] OR;  Service: General;  Laterality: N/A;   MOHS SURGERY  11/10/2009   basal cell carcinoma near eye   OOPHORECTOMY     SHOULDER SURGERY  04/12/2006   TONSILLECTOMY  05/13/1974   UPPER GASTROINTESTINAL ENDOSCOPY        Medications: Prior to Admission medications   Medication Sig Start Date End Date Taking? Authorizing Provider  acetaminophen (TYLENOL) 500 MG tablet Take 1,000 mg by mouth every 6 (six) hours as needed.   Yes [provider]  aspirin EC 81 MG tablet Take 81 mg by mouth daily. Swallow whole.   Yes [provider]  buPROPion  (WELLBUTRIN  XL) 300 MG 24 hr tablet Take 1 tablet (300 mg total) by mouth daily. TAKE 1 TABLET BY MOUTH EVERY DAY 12/23/23  Yes Tower, Laine LABOR, MD  Cetirizine HCl (ZYRTEC PO) Take 1 tablet by mouth daily as needed (for allergies). 03/13/20  Yes [provider]  Cholecalciferol (VITAMIN D3 PO) Take 5,000 Units by mouth daily.   Yes [provider]  ciclopirox (PENLAC) 8 % solution Apply 1 application  topically daily. 05/08/21  Yes [provider]  cyclobenzaprine  (FLEXERIL ) 10 MG tablet Take 0.5 tablets (5 mg total) by mouth 3 (three) times daily as needed for muscle spasms. 12/23/23  Yes Tower, Laine LABOR, MD  ESOMEPRAZOLE  MAGNESIUM  PO Take 1 tablet by mouth daily as needed. 02/11/20  Yes  [provider]  fluticasone  (FLONASE ) 50 MCG/ACT nasal spray PLACE 2 SPRAYS INTO THE NOSE DAILY AS NEEDED 04/03/18  Yes Tower, Laine LABOR, MD  furosemide  (LASIX ) 20 MG tablet Twice weekly and as needed Patient taking differently: Take 20 mg by mouth See admin instructions. Take 1 tablet by mouth daily as needed or twice weekly as needed for swelling 09/29/23  Yes Okey Vina GAILS, MD  loratadine (CLARITIN) 10 MG tablet Take 10 mg by mouth as needed for allergies. Reported on 05/19/2015   Yes [provider]  metFORMIN  (GLUCOPHAGE -XR) 500 MG 24 hr tablet Take 2 tablets (1,000 mg total) by mouth daily. 01/23/24  Yes Tower, Laine LABOR, MD  metoprolol  succinate (TOPROL -XL) 25 MG 24 hr tablet Take 1 tablet (25 mg total) by mouth daily. Take with or immediately following a meal. 09/29/23 02/19/25 Yes Okey Vina GAILS, MD  Probiotic Product (PROBIOTIC PO) Take 1 tablet by mouth every evening. Align   Yes [provider]  simvastatin  (ZOCOR ) 10 MG tablet TAKE 1 TABLET BY MOUTH EVERY DAY 09/26/23  Yes Tower, Laine LABOR, MD  potassium chloride  (KLOR-CON ) 10 MEQ tablet Take 1 tablet (10 mEq total) by mouth daily. AND AS NEEDED WITH YOUR LASIX  Patient taking differently: Take 10 mEq by mouth See admin instructions. Take 1 tablet by mouth along with Lasix  as needed for swelling 08/20/22   Parthenia Olivia HERO, PA-C     Family History  Problem Relation Age of Onset   Graves' disease Mother    Depression Mother    Osteoporosis Mother    Hearing loss Mother    Hypertension Mother    Stroke Mother    Vision loss Mother    Varicose Veins Mother    Heart attack Father        x 3    Diabetes Father    Heart disease Father    Hypertension Father    Vision loss Father    Breast cancer Sister    Hypertension Sister    Colon polyps Brother    Heart attack Brother        x 2   Diabetes Brother    Obesity Brother    Cancer Brother    Heart disease Brother    Hypertension Brother    Hypertension Brother     Coronary artery disease Brother        with stents    Atrial fibrillation Brother    Hypertension Maternal Grandmother    Stroke Maternal Grandmother    Hypertension Maternal Grandfather    Stroke Maternal Grandfather    Heart disease Paternal Grandmother    Hypertension Paternal Grandmother    Hypertension Paternal Grandfather    Kidney disease Paternal Grandfather    Obesity Paternal Grandfather    Asthma Daughter    Heart disease Paternal Uncle    Heart disease Brother    Hypertension Brother    Heart disease Maternal Uncle    Hypertension Maternal Uncle    Diabetes Paternal Uncle    Heart  disease Paternal Uncle    Hypertension Paternal Uncle    Diabetes Paternal Uncle    Heart disease Paternal Uncle    Hypertension Paternal Uncle    Heart disease Paternal Uncle    Hypertension Paternal Uncle    Diabetes Paternal Uncle    Heart disease Paternal Uncle    Hypertension Paternal Uncle    Cancer Sister    Hypertension Sister    Varicose Veins Sister    Anxiety disorder Maternal Aunt    Hypertension Maternal Aunt    Stroke Maternal Aunt    Hypertension Maternal Aunt    Stroke Maternal Aunt    Hypertension Paternal Uncle    COPD Maternal Uncle    Heart disease Maternal Uncle    Colon cancer Neg Hx    Esophageal cancer Neg Hx    Rectal cancer Neg Hx    Stomach cancer Neg Hx     Social History   Socioeconomic History   Marital status: Married    Spouse name: Not on file   Number of children: 1   Years of education: Not on file   Highest education level: Associate degree: occupational, Scientist, product/process development, or vocational program  Occupational History   Occupation: Producer, television/film/video: [employer]  Tobacco Use   Smoking status: Never   Smokeless tobacco: Never  Vaping Use   Vaping status: Never Used  Substance and Sexual Activity   Alcohol use: Not Currently    Alcohol/week: 2.0 standard drinks of alcohol    Types: 1 Glasses of wine, 1 Standard drinks or  equivalent per week    Comment: occ   Drug use: No   Sexual activity: Yes    Birth control/protection: Post-menopausal  Other Topics Concern   Not on file  Social History Narrative   Some walking for exercise         Hematology - Dr. Deretha Beers - Dr. Daldorf   [facility]- [facility]- Dr. Bernardo   Social Drivers of Health   Financial Resource Strain: Low Risk  (12/22/2023)   Overall Financial Resource Strain (CARDIA)    Difficulty of Paying Living Expenses: Not hard at all  Food Insecurity: No Food Insecurity (02/20/2024)   Hunger Vital Sign    Worried About Running Out of Food in the Last Year: Never true    Ran Out of Food in the Last Year: Never true  Transportation Needs: No Transportation Needs (02/20/2024)   PRAPARE - Administrator, Civil Service (Medical): No    Lack of Transportation (Non-Medical): No  Physical Activity: Sufficiently Active (12/22/2023)   Exercise Vital Sign    Days of Exercise per Week: 3 days    Minutes of Exercise per Session: 50 min  Stress: No Stress Concern Present (12/22/2023)   [employer] of Occupational Health - Occupational Stress Questionnaire    Feeling of Stress: Only a little  Social Connections: Unknown (02/20/2024)   Social Connection and Isolation Panel    Frequency of Communication with Friends and Family: Twice a week    Frequency of Social Gatherings with Friends and Family: Not on file    Attends Religious Services: Never    Database administrator or Organizations: No    Attends Banker Meetings: Never    Marital Status: Married  Recent Concern: Social Connections - Moderately Isolated (02/20/2024)   Social Connection and Isolation Panel    Frequency of Communication with Friends and Family: Twice a  week    Frequency of Social Gatherings with Friends and Family: Twice a week    Attends Religious Services: Never    Diplomatic Services operational officer: Not on file    Attends Tax inspector Meetings: Never    Marital Status: Married     Review of Systems: A 12 point ROS discussed and pertinent positives are indicated in the HPI above.  All other systems are negative.   Vital Signs: BP (!) 168/79 (BP Location: Left Arm)   Pulse 73   Temp 97.8 F (36.6 C)   Resp 17   Ht 5' 5.5 (1.664 m)   Wt 180 lb (81.6 kg)   SpO2 97%   BMI 29.50 kg/m   Advance Care Plan: No documents on file   Physical Exam HENT:     Mouth/Throat:     Mouth: Mucous membranes are moist.     Pharynx: Oropharynx is clear.  Cardiovascular:     Rate and Rhythm: Normal rate and regular rhythm.  Pulmonary:     Effort: Pulmonary effort is normal.     Breath sounds: Normal breath sounds.  Abdominal:     Palpations: Abdomen is soft.     Tenderness: There is abdominal tenderness in the right lower quadrant and left lower quadrant.     Comments: Midline incision  Skin:    General: Skin is warm and dry.  Neurological:     Mental Status: She is alert and oriented to person, place, and time.  Psychiatric:        Mood and Affect: Mood normal.        Behavior: Behavior normal.     Imaging: CT ABDOMEN PELVIS W CONTRAST Result Date: 03/04/2024 EXAM: CT ABDOMEN AND PELVIS WITH CONTRAST 03/04/2024 12:23:00 PM TECHNIQUE: CT of the abdomen and pelvis was performed with the administration of 75 mL of iohexol (OMNIPAQUE) 350 MG/ML injection. Multiplanar reformatted images are provided for review. Automated exposure control, iterative reconstruction, and/or weight-based adjustment of the mA/kV was utilized to reduce the radiation dose to as low as reasonably achievable. COMPARISON: 02/29/2024 CLINICAL HISTORY: Intra-abdominal abscess. Abdominal Pain. FINDINGS: LOWER CHEST: No acute abnormality. LIVER: The liver is unremarkable. GALLBLADDER AND BILE DUCTS: Cholecystectomy. No biliary ductal dilatation. SPLEEN: No acute abnormality. PANCREAS: No acute abnormality. ADRENAL GLANDS: No acute  abnormality. KIDNEYS, URETERS AND BLADDER: 2 mm left kidney lower pole nonobstructive renal calculus. No stones in the ureters. No hydronephrosis. No perinephric or periureteral stranding. Urinary bladder is unremarkable. GI AND BOWEL: Stomach demonstrates no acute abnormality. Scattered small bowel wall thickening and mesenteric edema. There are dilated loops of small bowel up to 3.6 cm in diameter with transition to nondilated small bowel in the left abdomen at the site of a small bowel angulation. Distal small bowel loops are decompressed. There is no bowel obstruction. PERITONEUM AND RETROPERITONEUM: Small amount of scattered free intraperitoneal gas, reduced from prior. There is a pelvic fluid collection with some higher density components along the anterior margin of the collection. The collection is 11.9 x 6.2 x 3.6 cm (volume = 140 cm). The collection is roughly similar to prior. VASCULATURE: Aorta is normal in caliber. LYMPH NODES: No lymphadenopathy. REPRODUCTIVE ORGANS: No acute abnormality. BONES AND SOFT TISSUES: Lower lumbar spondylosis and degenerative disc disease. Subcutaneous gas and edema along the left upper thigh, expected given the recent incision. There is a 3.8 x 2.2 x 3.2 cm (volume = 14 cm) collection along the left anterior abdominal wall at  the midline incision site, previously 4.0 x 2.4 x 3.5 cm (volume = 18 cm). The collection could represent abscess or hematoma given the recent surgery. IMPRESSION: 1. Small bowel obstruction with transition in the left abdomen at a site of small bowel angulation. 2. Pelvic fluid collection, roughly similar to prior, measuring 11.9 x 6.2 x 3.6 cm (volume = 140 cm). 3. Left anterior abdominal wall collection at the midline incision site, possibly representing abscess or hematoma, measuring 3.8 x 2.2 x 3.2 cm (volume = 14 cm), previously 4.0 x 2.4 x 3.5 cm (volume = 18 cm). Electronically signed by: Lonni Necessary MD 03/04/2024 04:37 PM  EDT RP Workstation: [id number]   VAS US  LOWER EXTREMITY VENOUS (DVT) Result Date: 03/02/2024  Lower Venous DVT Study Patient Name:  KYNDRA CONDRON Sweeten  Date of Exam:   03/02/2024 Medical Rec #: [id number]             Accession #:    [id number] Date of Birth: 1955-10-10             Patient Gender: F Patient Age:   68 years Exam Location:  [facility] Procedure:      VAS US  LOWER EXTREMITY VENOUS (DVT) Referring Phys: CHAPMAN ROTA --------------------------------------------------------------------------------  Indications: Swelling.  Risk Factors: None identified. Limitations: Body habitus and poor ultrasound/tissue interface. Comparison Study: No prior studies. Performing Technologist: Cordella Collet RVT  Examination Guidelines: A complete evaluation includes B-mode imaging, spectral Doppler, color Doppler, and power Doppler as needed of all accessible portions of each vessel. Bilateral testing is considered an integral part of a complete examination. Limited examinations for reoccurring indications may be performed as noted. The reflux portion of the exam is performed with the patient in reverse Trendelenburg.  +---------+---------------+---------+-----------+----------+--------------+ RIGHT    CompressibilityPhasicitySpontaneityPropertiesThrombus Aging +---------+---------------+---------+-----------+----------+--------------+ CFV      Full           Yes      Yes                                 +---------+---------------+---------+-----------+----------+--------------+ SFJ      Full                                                        +---------+---------------+---------+-----------+----------+--------------+ FV Prox  Full                                                        +---------+---------------+---------+-----------+----------+--------------+ FV Mid   Full                                                         +---------+---------------+---------+-----------+----------+--------------+ FV DistalFull                                                        +---------+---------------+---------+-----------+----------+--------------+  PFV      Full                                                        +---------+---------------+---------+-----------+----------+--------------+ POP      Full           Yes      Yes                                 +---------+---------------+---------+-----------+----------+--------------+ PTV      Full                                                        +---------+---------------+---------+-----------+----------+--------------+ PERO     Full                                                        +---------+---------------+---------+-----------+----------+--------------+   +---------+---------------+---------+-----------+----------+--------------+ LEFT     CompressibilityPhasicitySpontaneityPropertiesThrombus Aging +---------+---------------+---------+-----------+----------+--------------+ CFV      Full           Yes      Yes                                 +---------+---------------+---------+-----------+----------+--------------+ SFJ      Full                                                        +---------+---------------+---------+-----------+----------+--------------+ FV Prox  Full                                                        +---------+---------------+---------+-----------+----------+--------------+ FV Mid                  Yes      Yes                                 +---------+---------------+---------+-----------+----------+--------------+ FV Distal               Yes      Yes                                 +---------+---------------+---------+-----------+----------+--------------+ PFV      Full                                                         +---------+---------------+---------+-----------+----------+--------------+  POP      Full           Yes      Yes                                 +---------+---------------+---------+-----------+----------+--------------+ PTV      Full                                                        +---------+---------------+---------+-----------+----------+--------------+ PERO     Full                                                        +---------+---------------+---------+-----------+----------+--------------+     Summary: RIGHT: - There is no evidence of deep vein thrombosis in the lower extremity. However, portions of this examination were limited- see technologist comments above.  - No cystic structure found in the popliteal fossa.  LEFT: - There is no evidence of deep vein thrombosis in the lower extremity. However, portions of this examination were limited- see technologist comments above.  - No cystic structure found in the popliteal fossa.  *See table(s) above for measurements and observations. Electronically signed by Debby Robertson on 03/02/2024 at 2:25:20 PM.    Final    CT ABDOMEN PELVIS W CONTRAST Result Date: 03/01/2024 EXAM: CT ABDOMEN AND PELVIS WITH CONTRAST 02/29/2024 10:41:00 PM TECHNIQUE: CT of the abdomen and pelvis was performed with the administration of 75 mL of iohexol (OMNIPAQUE) 350 MG/ML injection. Multiplanar reformatted images are provided for review. Automated exposure control, iterative reconstruction, and/or weight-based adjustment of the mA/kV was utilized to reduce the radiation dose to as low as reasonably achievable. COMPARISON: CTA chest, abdomen and pelvis 02/20/2024, CT of abdomen and pelvis with contrast 08/31/2003. CLINICAL HISTORY: Intra-abdominal abscess. Chief complaints: Abdominal pain. Exploratory laparotomy on 02/23/2024. FINDINGS: LOWER CHEST: Bilateral symmetric minimal layering pleural effusions. Mild subsegmental atelectasis posterior lung bases.  Lung bases are otherwise clear. The cardiac size is normal. No pneumothorax. LIVER: The liver is unremarkable. GALLBLADDER AND BILE DUCTS: The gallbladder is absent. No biliary ductal dilatation. SPLEEN: No acute abnormality. PANCREAS: Partial pancreatic atrophy. No mass or ductal dilatation. ADRENAL GLANDS: No acute abnormality. KIDNEYS, URETERS AND BLADDER: No stones in the kidneys or ureters. No hydronephrosis. No renal mass enhancement. No perinephric or periureteral stranding. Urinary bladder is unremarkable. GI AND BOWEL: The gastric wall and duodenum are unremarkable. Beginning at the ligament of Treitz, there is small bowel dilatation throughout up to 4 cm, except for normal caliber right lower quadrant segments. There are thickened and angulated segments in the mid to lower abdomen, any one of which could be transitional. The small bowel dilatation could be obstructive or due to ileus or enteritis . The appendix is normal. The wall of the colon is unremarkable. PERITONEUM AND RETROPERITONEUM: There is scattered free intraperitoneal air anteriorly in the pelvis to the left and in the midline. This is probably related to the recent surgery. Correlate clinically to exclude perforated bowel. There is slight enhancement and thickening of the peritoneal reflections along with patchy mesenteric edema in the lower abdomen  and pelvis, which also could be postsurgical or inflammatory. There is a thin-walled pelvic fluid collection in between the uterus and bladder (series 4, axial 78) measuring 8.9 x 7.3 cm and 15 Hounsfield units. . The collection appears uncomplicated. This is most likely a seroma. Early presentation of abscess is possible. There is a small amount of ascites in the perihepatic space and scattered interloop ascites in the mid and lower abdomen. VASCULATURE: Aorta is normal in caliber. LYMPH NODES: No lymphadenopathy. REPRODUCTIVE ORGANS: No acute abnormality. BONES AND SOFT TISSUES: New midline  laparotomy skin staples. Small subincisional fluid collection at the superior aspect measuring 3.6 x 2.7 cm and 18 Hounsfield units, probable seroma. Just below the fluid collection, there are a few tiny subcutaneous air pockets, nonspecific and could be related to the recent closure or infectious. There is diffuse increased body wall anasarca, probably from third spacing and fluid overload. There is chronic nonerosive bilateral sacroiliitis. Degenerative change lower lumbar spine with acquired spinal stenosis of L3-L4. No acute or other significant osseous findings. IMPRESSION: 1. Small bowel dilatation up to 4 cm with thickened and angulated segments in the mid to lower abdomen that may represent transitional segments; findings may indicate small-bowel obstruction or postoperative ileus / enteritis .The small bowel is normal in caliber in the right lower quadrant. 2. Scattered free intraperitoneal air, favored postsurgical; recommend close clinical follow up and, if indicated, imaging follow-up to exclude bowel perforation. 3. Thin-walled pelvic fluid collection (8.9 x 7.3 cm, 15 HU) between the uterus and bladder, most consistent with a postoperative seroma; early abscess not excluded and short-interval follow-up is recommended. 4. Patchy mesenteric edema along with slightly enhancing peritoneal reflections, which could be postoperative etiology or inflammatory. 5. Diffuse increased body wall anasarca, likely from third spacing and fluid overload. 6. Subincisional air and fluid as described above. 7. Small amount of ascites. Electronically signed by: Francis Quam MD 03/01/2024 12:02 AM EDT RP Workstation: [id number]   DG Abd 1 View Result Date: 02/29/2024 CLINICAL DATA:  Abdominal pain. EXAM: ABDOMEN - 1 VIEW COMPARISON:  02/23/2024 FINDINGS: Interval evolution of gaseous small bowel distension with decreased distension in the left and mid abdomen but some gas distended small bowel in the right abdomen  appears progressive, measuring 4.7 cm diameter today. NG tube is been removed in the interval. Mild gaseous distention of stomach evident. Probable gas in the cecum/ascending colon without dilatation. Gas is visible in the rectum. IMPRESSION: Interval evolution of gaseous small bowel distension with decreased distension in the left and mid abdomen but some gas distended small bowel in the right abdomen appears progressive. Imaging features may reflect evolving ileus or small bowel obstruction. Electronically Signed   By: Camellia Candle M.D.   On: 02/29/2024 06:36   US  EKG SITE RITE Result Date: 02/27/2024 If Site Rite image not attached, placement could not be confirmed due to current cardiac rhythm.  DG Abd Portable 1V Result Date: 02/23/2024 CLINICAL DATA:  Small bowel obstruction EXAM: PORTABLE ABDOMEN - 1 VIEW COMPARISON:  Prior chest x-ray yesterday 02/22/2024 FINDINGS: Persistent gaseous distension of multiple loops of small bowel in the left mid abdomen with a maximal diameter of 4.4 cm. Findings are centrally unchanged. Gastric tube remains in good position with the tip overlying the fundus. No large free air. Multilevel degenerative disc disease. IMPRESSION: Persistent and unchanged small bowel obstruction. Maximal small bowel diameter of 4.4 cm. Gastric tube overlies the stomach. Electronically Signed   By: Wilkie Karalee HERO.D.  On: 02/23/2024 09:07   DG Abd Portable 1V Result Date: 02/22/2024 CLINICAL DATA:  Small-bowel obstruction EXAM: PORTABLE ABDOMEN - 1 VIEW COMPARISON:  Abdominal radiograph dated 02/21/2024 FINDINGS: Gastric/enteric tube tip projects over the stomach. Slightly decreased but persistent gas-filled dilated small bowel loops within the central abdomen. Dilute contrast material is seen within left hemi abdominal bowel loops. IMPRESSION: Slightly decreased but persistent gas-filled dilated small bowel loops within the central abdomen. Electronically Signed   By: Limin  Xu  M.D.   On: 02/22/2024 10:21   DG Abd Portable 1V Result Date: 02/21/2024 EXAM: 1 VIEW XRAY OF THE ABDOMEN 02/21/2024 09:30:00 AM COMPARISON: 02/20/2024 CLINICAL HISTORY: SBO (small bowel obstruction) (HCC) [id number]. Reason for exam: SBO. FINDINGS: LINES, TUBES AND DEVICES: Gastric decompression tube in place in the gastric fundus. New enteric contrast material within the gastric fundus. BOWEL: Dilation of small bowel loops within the central abdomen, up to 4.5 cm in diameter compared to 3.3 cm previously, consistent with small bowel obstruction. SOFT TISSUES: Right upper quadrant surgical clips noted. No opaque urinary calculi. BONES: No acute osseous abnormality. IMPRESSION: 1. Small bowel obstruction with progressive dilation of small bowel loops up to 4.5 cm, increased from 3.3 cm previously. 2. Gastric decompression tube terminates in the gastric fundus. Electronically signed by: Waddell Calk MD 02/21/2024 10:05 AM EDT RP Workstation: [id number]   DG Abd Portable 1V-Small Bowel Obstruction Protocol-initial, 8 hr delay Result Date: 02/20/2024 CLINICAL DATA:  8 hour small-bowel follow up EXAM: PORTABLE ABDOMEN - 1 VIEW COMPARISON:  Film from earlier in the same day. FINDINGS: Gastric catheter is again noted in the stomach. Some retained contrast is noted in the gastric fundus. Administered contrast lies within the mildly dilated small bowel loops centrally. No significant colonic contrast is seen to suggest partial small bowel obstruction. 24 hour follow-up is recommended. IMPRESSION: Contrast is noted within dilated loops of small bowel centrally. No significant colonic contrast is seen. 24 hour follow-up film is recommended. Electronically Signed   By: Oneil Devonshire M.D.   On: 02/20/2024 19:00   DG Abd 1 View Result Date: 02/20/2024 CLINICAL DATA:  Nasogastric tube placement. EXAM: DG ABDOMEN 1V COMPARISON:  Chest, abdomen and pelvis CTA dated 02/20/2024 FINDINGS: Nasogastric tube tip and side hole in  the proximal stomach. Normal bowel-gas pattern. Excreted contrast in the renal collecting systems. Lower lumbar spine degenerative changes. Cholecystectomy clips. IMPRESSION: Nasogastric tube tip and side hole in the proximal stomach. Electronically Signed   By: Elspeth Bathe M.D.   On: 02/20/2024 09:45   DG Chest Port 1 View Result Date: 02/20/2024 EXAM: 1 VIEW(S) XRAY OF THE CHEST 02/20/2024 06:01:34 AM COMPARISON: 06/05/06. CLINICAL HISTORY: abdominal pain, hypoxia. abdominal pain, hypoxia; rover FINDINGS: LUNGS AND PLEURA: No focal pulmonary opacity. No pulmonary edema. No pleural effusion. No pneumothorax. HEART AND MEDIASTINUM: No acute abnormality of the cardiac and mediastinal silhouettes. BONES AND SOFT TISSUES: No acute osseous abnormality. VISUALIZED UPPER ABDOMEN: Gaseous distention of stomach partially visible. IMPRESSION: 1. No acute cardiopulmonary process. Electronically signed by: Waddell Calk MD 02/20/2024 06:25 AM EDT RP Workstation: [id number]   CT Angio Chest/Abd/Pel for Dissection W and/or W/WO Result Date: 02/20/2024 EXAM: CT CHEST, ABDOMEN AND PELVIS WITH AND WITHOUT CONTRAST 02/20/2024 03:50:09 AM TECHNIQUE: CT of the chest, abdomen and pelvis was performed with and without the administration of intravenous contrast. Multiplanar reformatted images are provided for review. Automated exposure control, iterative reconstruction, and/or weight based adjustment of the mA/kV was utilized to reduce the radiation dose to  as low as reasonably achievable. COMPARISON: None available. CLINICAL HISTORY: Acute aortic syndrome (AAS) suspected; severe bilateral lower abdominal pain, history of factor V leiden. Patient complains of sudden onset abdominal pain with radiation to both sides of lower abdomen and diarrhea x 2-3 hours ago, denies nausea/vomiting. FINDINGS: CHEST: MEDIASTINUM AND LYMPH NODES: No intracranial hematoma, dissection, or aneurysm. No significant atherosclerotic calcification.  LUNGS AND PLEURA: No pulmonary embolism to the segmental level. Central pulmonary arteries are of normal caliber. No pleural effusion or pneumothorax. ABDOMEN AND PELVIS: LIVER: The liver is unremarkable. GALLBLADDER AND BILE DUCTS: Status post cholecystectomy. No biliary ductal dilatation. SPLEEN: No acute abnormality. PANCREAS: No acute abnormality. ADRENAL GLANDS: No acute abnormality. KIDNEYS, URETERS AND BLADDER: No stones in the kidneys or ureters. No hydronephrosis. No perinephric or periureteral stranding. Urinary bladder is unremarkable. GI AND BOWEL: There is a high-grade mid small bowel obstruction within the right hemipelvis. The small bowel proximal to this point is fluid-filled and dilated up to 3 cm in diameter. Distally, the bowel is decompressed as is the colon. Appendix is absent. REPRODUCTIVE ORGANS: No acute abnormality. PERITONEUM AND RETROPERITONEUM: Mild ascites. No free intraperitoneal gas. VASCULATURE: Thoracoabdominal aorta is normal in course and caliber. ABDOMINAL AND PELVIS LYMPH NODES: No lymphadenopathy. BONES AND SOFT TISSUES: Osseous structures are age appropriate. No acute bone abnormality. No lytic or blastic bone lesion. IMPRESSION: 1. No evidence of acute aortic syndrome. 2. No pulmonary embolism to the segmental level. 3. High-grade mid small bowel obstruction within the right hemipelvis with proximal small bowel dilation up to 3 cm and decompressed distal bowel and colon. No free intraperitoneal gas. Mild ascites. Electronically signed by: Dorethia Molt MD 02/20/2024 04:02 AM EDT RP Workstation: [id number]    Labs:  CBC: Recent Labs    03/01/24 1405 03/03/24 0235 03/04/24 1130 03/05/24 0845  WBC 12.8* 12.5* 11.5* 9.8  HGB 10.7* 9.4* 10.4* 9.7*  HCT 33.2* 29.5* 32.7* 30.4*  PLT 183 229 310 348    COAGS: No results for input(s): INR, APTT in the last 8760 hours.  BMP: Recent Labs    03/01/24 0358 03/02/24 0422 03/03/24 0235 03/05/24 0845  NA 137  136 136 139  K 4.3 4.3 4.3 3.6  CL 105 100 101 105  CO2 24 28 27 25   GLUCOSE 200* 174* 139* 136*  BUN 9 13 13  7*  CALCIUM 7.9* 8.1* 7.9* 8.2*  CREATININE 0.54 0.58 0.70 0.80  GFRNONAA >60 >60 >60 >60    LIVER FUNCTION TESTS: Recent Labs    02/26/24 0512 02/27/24 0655 02/28/24 0617 02/29/24 0331 03/01/24 0358 03/05/24 0845  BILITOT 1.0 0.7  --   --  0.4 0.5  AST 13* 15  --   --  22 20  ALT 14 13  --   --  24 20  ALKPHOS 64 71  --   --  88 103  PROT 4.7* 4.5*  --   --  4.1* 4.1*  ALBUMIN 2.1* 1.9* 1.7* 1.7* 1.6* 2.0*    TUMOR MARKERS: No results for input(s): AFPTM, CEA, CA199, CHROMGRNA in the last 8760 hours.  Assessment and Plan:  Request for  image guided pelvic fluid collection drain placement approved by Dr Jenna for 10/24 pending INR result. No contraindications for procedure identified in ROS, physical exam, or review of pre-sedation considerations. Labs reviewed and within acceptable range. INR pending at time of this note. 10/23 imaging available and reviewed VSS, afebrile Has not taken home ASA in last 5 days. Lovenox held  for procedure. Abx managed by IP team Confirms she has been NPO   Risks and benefits discussed with the patient including bleeding, infection, damage to adjacent structures, bowel perforation/fistula connection, and sepsis.  All of the patient's questions were answered, patient is agreeable to proceed. Consent signed and in chart.   Thank you for allowing our service to participate in SHATHA HOOSER 's care.    Electronically Signed: Laymon Coast, NP   03/05/2024, 10:52 AM     I spent a total of 20 Minutes    in face to face in clinical consultation, greater than 50% of which was counseling/coordinating care for image guided pelvic fluid collection drain placement.    (A copy of this note was sent to the referring provider and the time of visit.)

## 2024-03-05 NOTE — Progress Notes (Signed)
 Progress Note  11 Days Post-Op  Subjective: Tolerating soft diet. Had multiple small, loose stools yesterday AM, including one loose green stool. Took one dose of lomotil and this improved. Only had one BM last night. States she had a good day yesterday, less abdominal pain, eating PO, walking a lot.   ROS  All negative with the exception of above.  Objective: Vital signs in last 24 hours: Temp:  [97.6 F (36.4 C)-98 F (36.7 C)] 97.8 F (36.6 C) (10/24 0755) Pulse Rate:  [72-83] 73 (10/24 0755) Resp:  [16-17] 17 (10/24 0755) BP: (142-171)/(62-84) 168/79 (10/24 0755) SpO2:  [97 %-99 %] 97 % (10/24 0755) Last BM Date : 03/04/24  Intake/Output from previous day: 10/23 0701 - 10/24 0700 In: 240 [P.O.:240] Out: -  Intake/Output this shift: No intake/output data recorded.  PE: General: Pleasant female who is in bedside chair in NAD. HEENT: Head is normocephalic, atraumatic.  Heart: HR normal. Lungs: Respiratory effort nonlabored. Abd: Soft, ND. Midline incision dressing cdi MS: Edema noted to be improved of bilateral lower and upper extremities.  Psych: A&Ox3 with an appropriate affect.    Lab Results:  Recent Labs    03/04/24 1130 03/05/24 0845  WBC 11.5* 9.8  HGB 10.4* 9.7*  HCT 32.7* 30.4*  PLT 310 348   BMET Recent Labs    03/03/24 0235  NA 136  K 4.3  CL 101  CO2 27  GLUCOSE 139*  BUN 13  CREATININE 0.70  CALCIUM 7.9*   PT/INR No results for input(s): LABPROT, INR in the last 72 hours. CMP     Component Value Date/Time   NA 136 03/03/2024 0235   NA 143 01/08/2018 1605   K 4.3 03/03/2024 0235   CL 101 03/03/2024 0235   CO2 27 03/03/2024 0235   GLUCOSE 139 (H) 03/03/2024 0235   BUN 13 03/03/2024 0235   BUN 12 01/08/2018 1605   CREATININE 0.70 03/03/2024 0235   CALCIUM 7.9 (L) 03/03/2024 0235   PROT 4.1 (L) 03/01/2024 0358   ALBUMIN 1.6 (L) 03/01/2024 0358   AST 22 03/01/2024 0358   ALT 24 03/01/2024 0358   ALKPHOS 88 03/01/2024  0358   BILITOT 0.4 03/01/2024 0358   GFRNONAA >60 03/03/2024 0235   GFRAA 107 01/08/2018 1605   Lipase     Component Value Date/Time   LIPASE 33 02/19/2024 1950       Studies/Results: CT ABDOMEN PELVIS W CONTRAST Result Date: 03/04/2024 EXAM: CT ABDOMEN AND PELVIS WITH CONTRAST 03/04/2024 12:23:00 PM TECHNIQUE: CT of the abdomen and pelvis was performed with the administration of 75 mL of iohexol (OMNIPAQUE) 350 MG/ML injection. Multiplanar reformatted images are provided for review. Automated exposure control, iterative reconstruction, and/or weight-based adjustment of the mA/kV was utilized to reduce the radiation dose to as low as reasonably achievable. COMPARISON: 02/29/2024 CLINICAL HISTORY: Intra-abdominal abscess. Abdominal Pain. FINDINGS: LOWER CHEST: No acute abnormality. LIVER: The liver is unremarkable. GALLBLADDER AND BILE DUCTS: Cholecystectomy. No biliary ductal dilatation. SPLEEN: No acute abnormality. PANCREAS: No acute abnormality. ADRENAL GLANDS: No acute abnormality. KIDNEYS, URETERS AND BLADDER: 2 mm left kidney lower pole nonobstructive renal calculus. No stones in the ureters. No hydronephrosis. No perinephric or periureteral stranding. Urinary bladder is unremarkable. GI AND BOWEL: Stomach demonstrates no acute abnormality. Scattered small bowel wall thickening and mesenteric edema. There are dilated loops of small bowel up to 3.6 cm in diameter with transition to nondilated small bowel in the left abdomen at the site of a  small bowel angulation. Distal small bowel loops are decompressed. There is no bowel obstruction. PERITONEUM AND RETROPERITONEUM: Small amount of scattered free intraperitoneal gas, reduced from prior. There is a pelvic fluid collection with some higher density components along the anterior margin of the collection. The collection is 11.9 x 6.2 x 3.6 cm (volume = 140 cm). The collection is roughly similar to prior. VASCULATURE: Aorta is normal in caliber.  LYMPH NODES: No lymphadenopathy. REPRODUCTIVE ORGANS: No acute abnormality. BONES AND SOFT TISSUES: Lower lumbar spondylosis and degenerative disc disease. Subcutaneous gas and edema along the left upper thigh, expected given the recent incision. There is a 3.8 x 2.2 x 3.2 cm (volume = 14 cm) collection along the left anterior abdominal wall at the midline incision site, previously 4.0 x 2.4 x 3.5 cm (volume = 18 cm). The collection could represent abscess or hematoma given the recent surgery. IMPRESSION: 1. Small bowel obstruction with transition in the left abdomen at a site of small bowel angulation. 2. Pelvic fluid collection, roughly similar to prior, measuring 11.9 x 6.2 x 3.6 cm (volume = 140 cm). 3. Left anterior abdominal wall collection at the midline incision site, possibly representing abscess or hematoma, measuring 3.8 x 2.2 x 3.2 cm (volume = 14 cm), previously 4.0 x 2.4 x 3.5 cm (volume = 18 cm). Electronically signed by: Lonni Necessary MD 03/04/2024 04:37 PM EDT RP Workstation: [id number]    Anti-infectives: Anti-infectives (From admission, onward)    Start     Dose/Rate Route Frequency Ordered Stop   03/01/24 1400  piperacillin-tazobactam (ZOSYN) IVPB 3.375 g        3.375 g 12.5 mL/hr over 240 Minutes Intravenous Every 8 hours 03/01/24 1249     02/23/24 1400  ceFAZolin (ANCEF) IVPB 1 g/50 mL premix  Status:  Discontinued        1 g 100 mL/hr over 30 Minutes Intravenous Every 8 hours 02/23/24 1222 02/25/24 1016        Assessment/Plan POD#10 - S/P Diagnostic laparoscopy, Exploratory laparotomy, Repair of small bowel enterotomy x 2, small bowel stricturoplasty by Dr. Vicenta Poli on 02/23/2024 - CT from 10/19 showed small bowel dilatation up to 4 cm  pSBO vs ileus. Scattered free intraperitoneal air, favored postsurgical; Thin-walled pelvic fluid collection (8.9 x 7.3 cm, 15 HU) between the uterus and bladder, most consistent with a postoperative seroma; early  abscess not excluded and short-interval follow-up is recommended. +asasarca  - Zosyn initiated on 10/20. WBC normalized to 9.8 - follow up CT yesterday showed stable pelvic fluid collection 11.9 cm at largest diameter; IR consulted and planning drainage today - appreciate their care.  - Continue wet-to-dry dressing BID or when soiled - Tolerating soft diet.  - hopefully draining pelvic fluid with help diarrhea resolve, monitor. Failure to improve or worsening may need to test for c.dif.   FEN: Soft; TPN stopped; will start gentle IVF 50 mL/hr since she is NPO for procedure, having diarrhea, also on lasix  VTE: Enoxaparin ID: Zosyn   Per [facility]: Volume overload AKI Hypokalemia/hypomagnesemia/hyperphosphatemia Prediabetes HTN H/O PVCs H/o factor V Leiden and prior DVTs Bilateral lower extre edema  -Bilateral lower extremity venous duplex completed 10/21: No evidence of DVT noted but exam was limited.   HLD GERD Anxiety/depression    LOS: 14 days   I reviewed hospitalist notes, specialist notes, nursing notes, last 24 h vitals and pain scores, last 48 h intake and output, last 24 h labs and trends, and last 24 h imaging results.  Almarie GORMAN Pringle, PA-C  [facility] 03/05/2024, 10:02 AM Please see Amion for pager number during day hours 7:00am-4:30pm

## 2024-03-05 NOTE — Progress Notes (Signed)
 PT Cancellation Note  Patient Details Name: CORDELIA BESSINGER MRN: [id number] DOB: Aug 28, 1955   Cancelled Treatment:    Reason Eval/Treat Not Completed: Patient at procedure or test/unavailable (Pt off floor with IR for drain placement. Will follow-up for PT treatment as schedule permits.)  Randall SAUNDERS, PT, DPT Acute Rehabilitation Services Office: [phone] Secure Chat Preferred  Delon CHRISTELLA Callander 03/05/2024, 1:48 PM

## 2024-03-05 NOTE — Progress Notes (Signed)
 Patient approved for pelvic fluid collection drain placement by Dr. Jenna at the request of [facility] team. NPO diet and INR previously ordered by surgical team. Mid day lovenox held. IR team plans to see and consent patient for goal of completing this today.

## 2024-03-05 NOTE — Procedures (Signed)
 Interventional Radiology Procedure Note  Procedure: CT LEFT TRANSGLUTEAL PELVIC ABSCESS DRAIN     Complications: None  Estimated Blood Loss:  MIN  Findings: 32 CC PUS CX SENT    EMERSON FREDERIC SPECKING, MD

## 2024-03-05 NOTE — Progress Notes (Addendum)
 Kristine [name]  FMW:[id number] DOB: 13-Jun-1955 DOA: 02/19/2024 PCP: [name] LABOR, MD    Brief Narrative:  68 year old retired [employer] with a history of HTN, HLD, factor V Leiden mutation, DVT, GERD, anxiety/depression, cesarean section, cholecystectomy, tubal ligation, and right oophorectomy who presented to the ED 10/9 with the acute onset of cramping generalized abdominal pain with unrelenting nausea.  CT revealed a high-grade mid small bowel obstruction.  General Surgery evaluated the patient and she was admitted by [facility].  Following admission she unfortunately failed conservative therapy.  On 10/13 she underwent exploratory laparotomy with repair of a small bowel enterotomy as well as a small bowel stricturoplasty.  Her postoperative course was complicated by a stubborn ileus.  Follow-up CT 10/19 noted small bowel dilatation up to 4 cm with thick and angulated segments and a thin walled pelvic fluid collection between the uterus and bladder consistent with a postoperative seroma as well as generalized anasarca of the body wall.  Goals of Care:   Code Status: Full Code   DVT prophylaxis: enoxaparin (LOVENOX) injection 40 mg Start: 02/26/24 1200 Place and maintain sequential compression device Start: 02/24/24 0043   Interim Hx: No acute events recorded overnight.  Afebrile.  Blood pressure elevated with systolic 168-171.  CT abd/pelvis yesterday confirmed a persistent pelvic fluid collection, and a left anterior abdominal wall collection possibly representing abscess versus hematoma.  The patient is in good spirits with no new complaints at the time of my exam today.  Assessment & Plan:  Small bowel obstruction Status post exploratory laparotomy with stricturoplasty 10/13 after failed to improve with conservative therapy - postoperative care per General Surgery   Postoperative ileus With significant voluminous diarrhea -diarrhea has improved with Imodium and Metamucil -was able to start a  soft diet 10/20 -CT 10/23 suggests persistence -care per General Surgery  Surgical site infection On 10/19 the patient was found to have purulent drainage from her abdominal incision site - being treated with broad IV antibiotics -management per General Surgery - follow-up CT abdomen/pelvis 10/23 noted midline anterior abdominal wall collection consistent with abscess or hematoma measuring 3.8 x 2.2 x 3.2 cm  Pelvic fluid collection / postoperative seroma Noted on CT scan 10/19 - repeat imaging 10/23 suggest stability in size -to IR today for drain placement  Anasarca Due to low albumin in the setting of necessary volume expansion due to acute kidney injury - has responded well to cessation of IV fluid and diuretic  Hypokalemia Corrected with supplementation  Hypomagnesemia Corrected with supplementation  Prediabetes A1c 5.8 12/23/2023 -no known prior history of diabetes - was on metformin  prior to admission for possible metabolic syndrome  HTN BP remains elevated above goal - adjust treatment today and follow  Factor V Leiden mutation with prior DVTs Previously treated with 1 year of anticoagulation - no DVT on bilateral lower extremity venous duplex 10/21  HLD On Zocor  prior to this admission - holding due to limited intake  GERD Continue PPI  Anxiety/depression Continue usual bupropion  dose  Nutrition Problem: Moderate Malnutrition Etiology: acute illness (SBO/post op ileus) Signs/Symptoms: energy intake < or equal to 50% for > or equal to 5 days, mild muscle depletion Interventions: TPN     Family Communication: No family present at time of exam Disposition:   Acute Inpatient Rehab (3hours/Day)03/03/2024 1500  Objective: Blood pressure (!) 144/71, pulse 74, temperature 97.7 F (36.5 C), resp. rate 17, height 5' 5.5 (1.664 m), weight 81.6 kg, SpO2 96%.  Intake/Output Summary (Last 24  hours) at 03/05/2024 1519 Last data filed at 03/05/2024 0100 Gross per 24 hour   Intake 240 ml  Output --  Net 240 ml   Filed Weights   02/19/24 1931 02/23/24 1337  Weight: 81.6 kg 81.6 kg    Examination: General: No acute respiratory distress Lungs: Clear to auscultation bilaterally -no wheezing Cardiovascular: Regular rate and rhythm without murmur  Extremities: 1+ bilateral lower extremity edema without cyanosis  CBC: Recent Labs  Lab 03/03/24 0235 03/04/24 1130 03/05/24 0845  WBC 12.5* 11.5* 9.8  HGB 9.4* 10.4* 9.7*  HCT 29.5* 32.7* 30.4*  MCV 88.9 89.8 89.1  PLT 229 310 348   Basic Metabolic Panel: Recent Labs  Lab 03/01/24 0358 03/02/24 0422 03/03/24 0235 03/05/24 0845  NA 137 136 136 139  K 4.3 4.3 4.3 3.6  CL 105 100 101 105  CO2 24 28 27 25   GLUCOSE 200* 174* 139* 136*  BUN 9 13 13  7*  CREATININE 0.54 0.58 0.70 0.80  CALCIUM 7.9* 8.1* 7.9* 8.2*  MG 1.9 2.1 2.2 2.0  PHOS 3.6 5.0* 4.8*  --    GFR: Estimated Creatinine Clearance: 70.8 mL/min (by C-G formula based on SCr of 0.8 mg/dL).   Scheduled Meds:  acetaminophen  1,000 mg Oral TID   buPROPion   300 mg Oral Daily   Chlorhexidine Gluconate Cloth  6 each Topical Daily   enoxaparin (LOVENOX) injection  40 mg Subcutaneous Q24H   furosemide   20 mg Oral Daily   lidocaine 1 %  10 mL Intradermal Once   liver oil-zinc oxide   Topical QID   metoprolol  succinate  50 mg Oral Daily   pantoprazole  40 mg Oral Daily   psyllium  1 packet Oral BID   sodium chloride  flush  10-40 mL Intracatheter Q12H   Continuous Infusions:  lactated ringers 50 mL/hr at 03/05/24 1219   piperacillin-tazobactam (ZOSYN)  IV 3.375 g (03/05/24 0517)     LOS: 14 days   Reyes IVAR Moores, MD [facility] Office  [phone] Pager - Text Page per Tracey  If 7PM-7AM, please contact night-coverage per Amion 03/05/2024, 3:19 PM

## 2024-03-05 NOTE — Progress Notes (Signed)
 Mobility Specialist Progress Note:    03/05/24 1335  Mobility  Activity Ambulated with assistance  Level of Assistance Standby assist, set-up cues, supervision of patient - no hands on  Assistive Device Front wheel walker  Distance Ambulated (ft) 500 ft  Activity Response Tolerated well  Mobility Referral Yes  Mobility visit 1 Mobility  Mobility Specialist Start Time (ACUTE ONLY) 0935  Mobility Specialist Stop Time (ACUTE ONLY) 0949  Mobility Specialist Time Calculation (min) (ACUTE ONLY) 14 min   Pt received in bed agreeable to mobility. No c/o throughout, pt states she fells much better today. No physical assistance required. Returned to room w/o fault. Left in chair w/ call bell and personal belongings in reach. All needs met.  Thersia Minder Mobility Specialist  Please contact vis Secure Chat or  Rehab Office [phone]

## 2024-03-05 NOTE — Progress Notes (Signed)
 Inpatient Rehab Admissions Coordinator:    CIR following. Not yet ready today. Per surgery, may be ready by Sunday after drain is placed.  Leita Kleine, MS, CCC-SLP Rehab Admissions Coordinator  [phone] (celll) [phone] (office)

## 2024-03-06 DIAGNOSIS — K56609 Unspecified intestinal obstruction, unspecified as to partial versus complete obstruction: Secondary | ICD-10-CM | POA: Diagnosis not present

## 2024-03-06 LAB — BASIC METABOLIC PANEL WITH GFR
Anion gap: 10 (ref 5–15)
BUN: 7 mg/dL — ABNORMAL LOW (ref 8–23)
CO2: 23 mmol/L (ref 22–32)
Calcium: 8 mg/dL — ABNORMAL LOW (ref 8.9–10.3)
Chloride: 104 mmol/L (ref 98–111)
Creatinine, Ser: 0.74 mg/dL (ref 0.44–1.00)
GFR, Estimated: >60 mL/min (ref >60)
Glucose, Bld: 138 mg/dL — ABNORMAL HIGH (ref 70–99)
Potassium: 3.7 mmol/L (ref 3.5–5.1)
Sodium: 137 mmol/L (ref 135–145)

## 2024-03-06 MED ORDER — POTASSIUM CHLORIDE CRYS ER 10 MEQ PO TBCR
10.0000 meq | EXTENDED_RELEASE_TABLET | Freq: Every day | ORAL | Status: DC
  Administered 2024-03-06: 10 meq via ORAL
  Filled 2024-03-06: qty 1

## 2024-03-06 NOTE — Progress Notes (Signed)
 Mobility Specialist Progress Note:    03/06/24 1153  Mobility  Activity Ambulated with assistance (In hallway)  Level of Assistance Standby assist, set-up cues, supervision of patient - no hands on  Assistive Device Front wheel walker  Distance Ambulated (ft) 510 ft (x2)  Activity Response Tolerated well  Mobility Referral Yes  Mobility visit 1 Mobility  Mobility Specialist Start Time (ACUTE ONLY) 1055  Mobility Specialist Stop Time (ACUTE ONLY) 1117  Mobility Specialist Time Calculation (min) (ACUTE ONLY) 22 min   Received pt in chair and agreeable to mobility. No physical assistance required. Pt c/o drain site discomfort, otherwise tolerated well. Returned to room without fault. Left pt in bed. Personal belongings and call light within reach. All needs met.  Lavanda Pollack Mobility Specialist  Please contact via [employer] or  Rehab Office [phone]

## 2024-03-06 NOTE — Progress Notes (Signed)
 12 Days Post-Op   Subjective/Chief Complaint: Patient doing well.  She got her port drain yesterday.  She has no abdominal complaints.   Objective: Vital signs in last 24 hours: Temp:  [97.5 F (36.4 C)-97.8 F (36.6 C)] 97.7 F (36.5 C) (10/25 0845) Pulse Rate:  [66-79] 72 (10/25 0845) Resp:  [13-20] 17 (10/25 0845) BP: (144-181)/(71-90) 159/79 (10/25 0845) SpO2:  [96 %-99 %] 99 % (10/25 0845) Last BM Date : 03/05/24  Intake/Output from previous day: No intake/output data recorded. Intake/Output this shift: No intake/output data recorded.    Abd: Soft, ND. Midline incision dressing cdi  Drains gluteal drain in place.  No acute drainage noted.  Does not appear to be feculent nor enteric   Lab Results:  Recent Labs    03/04/24 1130 03/05/24 0845  WBC 11.5* 9.8  HGB 10.4* 9.7*  HCT 32.7* 30.4*  PLT 310 348   BMET Recent Labs    03/05/24 0845  NA 139  K 3.6  CL 105  CO2 25  GLUCOSE 136*  BUN 7*  CREATININE 0.80  CALCIUM 8.2*   PT/INR Recent Labs    03/05/24 1207  LABPROT 13.5  INR 1.0   ABG No results for input(s): PHART, HCO3 in the last 72 hours.  Invalid input(s): PCO2, PO2  Studies/Results: CT GUIDED PERITONEAL/RETROPERITONEAL FLUID DRAIN BY PERC CATH Result Date: 03/05/2024 INDICATION: Pelvic abscess EXAM: CT-GUIDED DRAINAGE OF THE POSTERIOR PELVIC ABSCESS MEDICATIONS: The patient is currently admitted to the hospital and receiving intravenous antibiotics. The antibiotics were administered within an appropriate time frame prior to the initiation of the procedure. ANESTHESIA/SEDATION: Moderate (conscious) sedation was employed during this procedure. A total of Versed 2.5 mg and Fentanyl 150 mcg was administered intravenously by the radiology nurse. Total intra-service moderate Sedation Time: 13 minutes. The patient's level of consciousness and vital signs were monitored continuously by radiology nursing throughout the procedure under my  direct supervision. COMPLICATIONS: None immediate. PROCEDURE: Informed written consent was obtained from the patient after a thorough discussion of the procedural risks, benefits and alternatives. All questions were addressed. Maximal Sterile Barrier Technique was utilized including caps, mask, sterile gowns, sterile gloves, sterile drape, hand hygiene and skin antiseptic. A timeout was performed prior to the initiation of the procedure. previous imaging reviewed. patient positioned prone. noncontrast localization CT performed. the posterior pelvic abscess was localized and marked for a left transgluteal approach. Under sterile conditions and local anesthesia, the 18 gauge 15 cm access needle was advanced from a left transgluteal medial approach into the fluid collection. Needle position confirmed with CT. Syringe aspiration yielded purulent fluid. Sample sent for culture. Guidewire inserted followed by tract dilatation to insert a 10 French drain. Drain catheter position confirmed with CT. Syringe aspiration yielded a total of 30 cc purulent fluid. Catheter was secured with a silk suture and a sterile dressing. External suction bulb applied followed by sterile dressing. No immediate complication. Patient tolerated the procedure well. IMPRESSION: Successful CT guided pelvic abscess drain placement as above. Electronically Signed   By: CHRISTELLA.  Shick M.D.   On: 03/05/2024 15:11   CT ABDOMEN PELVIS W CONTRAST Result Date: 03/04/2024 EXAM: CT ABDOMEN AND PELVIS WITH CONTRAST 03/04/2024 12:23:00 PM TECHNIQUE: CT of the abdomen and pelvis was performed with the administration of 75 mL of iohexol (OMNIPAQUE) 350 MG/ML injection. Multiplanar reformatted images are provided for review. Automated exposure control, iterative reconstruction, and/or weight-based adjustment of the mA/kV was utilized to reduce the radiation dose to as low  as reasonably achievable. COMPARISON: 02/29/2024 CLINICAL HISTORY: Intra-abdominal abscess.  Abdominal Pain. FINDINGS: LOWER CHEST: No acute abnormality. LIVER: The liver is unremarkable. GALLBLADDER AND BILE DUCTS: Cholecystectomy. No biliary ductal dilatation. SPLEEN: No acute abnormality. PANCREAS: No acute abnormality. ADRENAL GLANDS: No acute abnormality. KIDNEYS, URETERS AND BLADDER: 2 mm left kidney lower pole nonobstructive renal calculus. No stones in the ureters. No hydronephrosis. No perinephric or periureteral stranding. Urinary bladder is unremarkable. GI AND BOWEL: Stomach demonstrates no acute abnormality. Scattered small bowel wall thickening and mesenteric edema. There are dilated loops of small bowel up to 3.6 cm in diameter with transition to nondilated small bowel in the left abdomen at the site of a small bowel angulation. Distal small bowel loops are decompressed. There is no bowel obstruction. PERITONEUM AND RETROPERITONEUM: Small amount of scattered free intraperitoneal gas, reduced from prior. There is a pelvic fluid collection with some higher density components along the anterior margin of the collection. The collection is 11.9 x 6.2 x 3.6 cm (volume = 140 cm). The collection is roughly similar to prior. VASCULATURE: Aorta is normal in caliber. LYMPH NODES: No lymphadenopathy. REPRODUCTIVE ORGANS: No acute abnormality. BONES AND SOFT TISSUES: Lower lumbar spondylosis and degenerative disc disease. Subcutaneous gas and edema along the left upper thigh, expected given the recent incision. There is a 3.8 x 2.2 x 3.2 cm (volume = 14 cm) collection along the left anterior abdominal wall at the midline incision site, previously 4.0 x 2.4 x 3.5 cm (volume = 18 cm). The collection could represent abscess or hematoma given the recent surgery. IMPRESSION: 1. Small bowel obstruction with transition in the left abdomen at a site of small bowel angulation. 2. Pelvic fluid collection, roughly similar to prior, measuring 11.9 x 6.2 x 3.6 cm (volume = 140 cm). 3. Left anterior abdominal  wall collection at the midline incision site, possibly representing abscess or hematoma, measuring 3.8 x 2.2 x 3.2 cm (volume = 14 cm), previously 4.0 x 2.4 x 3.5 cm (volume = 18 cm). Electronically signed by: Lonni Necessary MD 03/04/2024 04:37 PM EDT RP Workstation: [id number]    Anti-infectives: Anti-infectives (From admission, onward)    Start     Dose/Rate Route Frequency Ordered Stop   03/01/24 1400  piperacillin-tazobactam (ZOSYN) IVPB 3.375 g        3.375 g 12.5 mL/hr over 240 Minutes Intravenous Every 8 hours 03/01/24 1249     02/23/24 1400  ceFAZolin (ANCEF) IVPB 1 g/50 mL premix  Status:  Discontinued        1 g 100 mL/hr over 30 Minutes Intravenous Every 8 hours 02/23/24 1222 02/25/24 1016       Assessment/Plan: s/p Procedure(s): LAPAROSCOPY, DIAGNOSTIC (N/A) LAPAROTOMY, EXPLORATORY (N/A) POD#11 - S/P Diagnostic laparoscopy, Exploratory laparotomy, Repair of small bowel enterotomy x 2, small bowel stricturoplasty by Dr. Vicenta Poli on 02/23/2024 - CT from 10/19 showed small bowel dilatation up to 4 cm   pSBO vs ileus. Scattered free intraperitoneal air, favored postsurgical; Thin-walled pelvic fluid collection (8.9 x 7.3 cm, 15 HU) between the uterus and bladder, most consistent with a postoperative seroma; early abscess not excluded and short-interval follow-up is recommended. +asasarca  - Zosyn initiated on 10/20. WBC normalized to 9.8 - follow up CT yesterday showed stable pelvic fluid collection 11.9 cm at largest diameter; IR consulted and planning drainage today - appreciate their care.  - Continue wet-to-dry dressing BID or when soiled - Tolerating soft diet.  - Drain in place  Diarrhea better  FEN:  Soft; TPN stopped; will start gentle IVF 50 mL/hr since she is NPO for procedure, having diarrhea, also on lasix  VTE: Enoxaparin ID: Zosyn     Per [facility]: Volume overload AKI Hypokalemia/hypomagnesemia/hyperphosphatemia Prediabetes HTN H/O PVCs H/o  factor V Leiden and prior DVTs Bilateral lower extre edema  -Bilateral lower extremity venous duplex completed 10/21: No evidence of DVT noted but exam was limited.   HLD GERD Anxiety/depression  Patient doing well.  Ready for CIR hopefully in next 24 to 48 hours.  JP drain in place from radiology.  Nothing recorded in chart.  Appears to be purulent and thick thick     LOS: 15 days    Debby DELENA Shipper MD 03/06/2024

## 2024-03-06 NOTE — Progress Notes (Signed)
 Kristine [name]  FMW:[id number] DOB: 10-05-1955 DOA: 02/19/2024 PCP: [name] LABOR, MD    Brief Narrative:  68 year old retired [employer] with a history of HTN, HLD, factor V Leiden mutation, DVT, GERD, anxiety/depression, cesarean section, cholecystectomy, tubal ligation, and right oophorectomy who presented to the ED 10/9 with the acute onset of cramping generalized abdominal pain with unrelenting nausea.  CT revealed a high-grade mid small bowel obstruction.  General Surgery evaluated the patient and she was admitted by [facility].  Following admission she unfortunately failed conservative therapy.  On 10/13 she underwent exploratory laparotomy with repair of a small bowel enterotomy as well as a small bowel stricturoplasty.  Her postoperative course was complicated by a stubborn ileus.  Follow-up CT 10/19 noted small bowel dilatation up to 4 cm with thick and angulated segments and a thin walled pelvic fluid collection between the uterus and bladder consistent with a postoperative seroma as well as generalized anasarca of the body wall.  Goals of Care:   Code Status: Full Code   DVT prophylaxis: enoxaparin (LOVENOX) injection 40 mg Start: 02/26/24 1200 Place and maintain sequential compression device Start: 02/24/24 0043   Interim Hx: The patient underwent successful CT-guided left transgluteal pelvic abscess drain placement in IR 10/24.  No acute events were recorded overnight.  She is afebrile.  Vital signs are stable.  Sitting up in a bedside chair in good spirits.  She is a pleasure to talk to.  Assessment & Plan:  Small bowel obstruction Status post exploratory laparotomy with stricturoplasty 10/13 after failed to improve with conservative therapy - postoperative care per General Surgery   Postoperative ileus With significant voluminous diarrhea - diarrhea has improved with Imodium and Metamucil -was able to start a soft diet 10/20 - care per General Surgery -symptomatically has  improved  Surgical site infection On 10/19 the patient was found to have purulent drainage from her abdominal incision site - being treated with broad IV antibiotics -management per General Surgery - follow-up CT abdomen/pelvis 10/23 noted midline anterior abdominal wall collection consistent with abscess or hematoma measuring 3.8 x 2.2 x 3.2 cm  Pelvic fluid collection / abscess v/s postoperative seroma Noted on CT scan 10/19 - repeat imaging 10/23 suggest stability in size - to IR 10/24 for drain placement - culture data pending   Anasarca Due to low albumin in the setting of necessary volume expansion due to acute kidney injury - has responded well to cessation of IV fluid and diuretic  Hypokalemia Corrected with supplementation  Hypomagnesemia Corrected with supplementation  Prediabetes A1c 5.8 12/23/2023 -no known prior history of diabetes - was on metformin  prior to admission for possible metabolic syndrome  HTN BP remains elevated above goal -treatment adjusted yesterday -continue to follow today  Factor V Leiden mutation with prior DVTs Previously treated with 1 year of anticoagulation - no DVT on bilateral lower extremity venous duplex 10/21  HLD On Zocor  prior to this admission - holding due to limited intake  GERD Continue PPI  Anxiety/depression Continue usual bupropion  dose  Nutrition Problem: Moderate Malnutrition Etiology: acute illness (SBO/post op ileus) Signs/Symptoms: energy intake < or equal to 50% for > or equal to 5 days, mild muscle depletion Interventions: TPN     Family Communication: No family present at time of exam Disposition:   Acute Inpatient Rehab (3hours/Day)03/03/2024 1500  Objective: Blood pressure (!) 159/79, pulse 72, temperature 97.7 F (36.5 C), temperature source Oral, resp. rate 17, height 5' 5.5 (1.664 m), weight 81.6 kg,  SpO2 99%. No intake or output data in the 24 hours ending 03/06/24 0914  Filed Weights   02/19/24 1931  02/23/24 1337  Weight: 81.6 kg 81.6 kg    Examination: General: No acute respiratory distress Lungs: Clear to auscultation bilaterally -no wheezing Cardiovascular: Regular rate and rhythm without murmur or rub Extremities: 1+ bilateral lower extremity edema without cyanosis with compression stockings on  CBC: Recent Labs  Lab 03/03/24 0235 03/04/24 1130 03/05/24 0845  WBC 12.5* 11.5* 9.8  HGB 9.4* 10.4* 9.7*  HCT 29.5* 32.7* 30.4*  MCV 88.9 89.8 89.1  PLT 229 310 348   Basic Metabolic Panel: Recent Labs  Lab 03/01/24 0358 03/02/24 0422 03/03/24 0235 03/05/24 0845  NA 137 136 136 139  K 4.3 4.3 4.3 3.6  CL 105 100 101 105  CO2 24 28 27 25   GLUCOSE 200* 174* 139* 136*  BUN 9 13 13  7*  CREATININE 0.54 0.58 0.70 0.80  CALCIUM 7.9* 8.1* 7.9* 8.2*  MG 1.9 2.1 2.2 2.0  PHOS 3.6 5.0* 4.8*  --    GFR: Estimated Creatinine Clearance: 70.8 mL/min (by C-G formula based on SCr of 0.8 mg/dL).   Scheduled Meds:  acetaminophen  1,000 mg Oral TID   ALPRAZolam   0.25 mg Oral QHS   buPROPion   300 mg Oral Daily   Chlorhexidine Gluconate Cloth  6 each Topical Daily   enoxaparin (LOVENOX) injection  40 mg Subcutaneous Q24H   furosemide   20 mg Oral Daily   liver oil-zinc oxide   Topical QID   metoprolol  succinate  50 mg Oral Daily   pantoprazole  40 mg Oral Daily   psyllium  1 packet Oral BID   sodium chloride  flush  10-40 mL Intracatheter Q12H   sodium chloride  flush  5 mL Intracatheter Q8H   Continuous Infusions:  lactated ringers 50 mL/hr at 03/05/24 1219   piperacillin-tazobactam (ZOSYN)  IV 3.375 g (03/06/24 0542)     LOS: 15 days   Reyes IVAR Moores, MD [facility] Office  [phone] Pager - Text Page per Tracey  If 7PM-7AM, please contact night-coverage per [employer] 03/06/2024, 9:14 AM

## 2024-03-06 NOTE — Plan of Care (Signed)
  Problem: Education: Goal: Knowledge of General Education information will improve Description: Including pain rating scale, medication(s)/side effects and non-pharmacologic comfort measures Outcome: Progressing   Problem: Health Behavior/Discharge Planning: Goal: Ability to manage health-related needs will improve Outcome: Progressing   Problem: Clinical Measurements: Goal: Ability to maintain clinical measurements within normal limits will improve Outcome: Progressing Goal: Cardiovascular complication will be avoided Outcome: Progressing   Problem: Nutrition: Goal: Adequate nutrition will be maintained Outcome: Progressing   

## 2024-03-07 ENCOUNTER — Encounter (HOSPITAL_COMMUNITY): Payer: Self-pay | Admitting: Physical Medicine & Rehabilitation

## 2024-03-07 ENCOUNTER — Other Ambulatory Visit: Payer: Self-pay

## 2024-03-07 ENCOUNTER — Inpatient Hospital Stay (HOSPITAL_COMMUNITY)
Admission: RE | Admit: 2024-03-07 | Discharge: 2024-03-12 | DRG: 949 | Disposition: A | Discharge status: Home / Self Care | Source: Intra-hospital | Attending: Physical Medicine & Rehabilitation | Admitting: Physical Medicine & Rehabilitation

## 2024-03-07 DIAGNOSIS — E876 Hypokalemia: Secondary | ICD-10-CM | POA: Diagnosis present

## 2024-03-07 DIAGNOSIS — Z823 Family history of stroke: Secondary | ICD-10-CM

## 2024-03-07 DIAGNOSIS — Z822 Family history of deafness and hearing loss: Secondary | ICD-10-CM

## 2024-03-07 DIAGNOSIS — K567 Ileus, unspecified: Secondary | ICD-10-CM | POA: Diagnosis present

## 2024-03-07 DIAGNOSIS — Z885 Allergy status to narcotic agent status: Secondary | ICD-10-CM

## 2024-03-07 DIAGNOSIS — E440 Moderate protein-calorie malnutrition: Secondary | ICD-10-CM | POA: Diagnosis present

## 2024-03-07 DIAGNOSIS — Z821 Family history of blindness and visual loss: Secondary | ICD-10-CM

## 2024-03-07 DIAGNOSIS — Z9889 Other specified postprocedural states: Secondary | ICD-10-CM

## 2024-03-07 DIAGNOSIS — F32A Depression, unspecified: Secondary | ICD-10-CM | POA: Diagnosis present

## 2024-03-07 DIAGNOSIS — Z7984 Long term (current) use of oral hypoglycemic drugs: Secondary | ICD-10-CM | POA: Diagnosis not present

## 2024-03-07 DIAGNOSIS — Z825 Family history of asthma and other chronic lower respiratory diseases: Secondary | ICD-10-CM | POA: Diagnosis not present

## 2024-03-07 DIAGNOSIS — Z86718 Personal history of other venous thrombosis and embolism: Secondary | ICD-10-CM | POA: Diagnosis not present

## 2024-03-07 DIAGNOSIS — I10 Essential (primary) hypertension: Secondary | ICD-10-CM | POA: Diagnosis present

## 2024-03-07 DIAGNOSIS — Z880 Allergy status to penicillin: Secondary | ICD-10-CM

## 2024-03-07 DIAGNOSIS — F419 Anxiety disorder, unspecified: Secondary | ICD-10-CM | POA: Diagnosis present

## 2024-03-07 DIAGNOSIS — Z8249 Family history of ischemic heart disease and other diseases of the circulatory system: Secondary | ICD-10-CM

## 2024-03-07 DIAGNOSIS — Z48815 Encounter for surgical aftercare following surgery on the digestive system: Principal | ICD-10-CM

## 2024-03-07 DIAGNOSIS — Z7982 Long term (current) use of aspirin: Secondary | ICD-10-CM | POA: Diagnosis not present

## 2024-03-07 DIAGNOSIS — F418 Other specified anxiety disorders: Secondary | ICD-10-CM | POA: Diagnosis present

## 2024-03-07 DIAGNOSIS — Z6832 Body mass index (BMI) 32.0-32.9, adult: Secondary | ICD-10-CM | POA: Diagnosis not present

## 2024-03-07 DIAGNOSIS — E669 Obesity, unspecified: Secondary | ICD-10-CM | POA: Diagnosis present

## 2024-03-07 DIAGNOSIS — K9189 Other postprocedural complications and disorders of digestive system: Secondary | ICD-10-CM | POA: Insufficient documentation

## 2024-03-07 DIAGNOSIS — E7800 Pure hypercholesterolemia, unspecified: Secondary | ICD-10-CM | POA: Diagnosis present

## 2024-03-07 DIAGNOSIS — R600 Localized edema: Secondary | ICD-10-CM | POA: Insufficient documentation

## 2024-03-07 DIAGNOSIS — R5381 Other malaise: Principal | ICD-10-CM | POA: Diagnosis present

## 2024-03-07 DIAGNOSIS — N739 Female pelvic inflammatory disease, unspecified: Secondary | ICD-10-CM | POA: Diagnosis present

## 2024-03-07 DIAGNOSIS — K56609 Unspecified intestinal obstruction, unspecified as to partial versus complete obstruction: Secondary | ICD-10-CM | POA: Diagnosis present

## 2024-03-07 DIAGNOSIS — Z83719 Family history of colon polyps, unspecified: Secondary | ICD-10-CM

## 2024-03-07 DIAGNOSIS — D6851 Activated protein C resistance: Secondary | ICD-10-CM | POA: Diagnosis present

## 2024-03-07 DIAGNOSIS — S31109D Unspecified open wound of abdominal wall, unspecified quadrant without penetration into peritoneal cavity, subsequent encounter: Secondary | ICD-10-CM | POA: Diagnosis not present

## 2024-03-07 DIAGNOSIS — E785 Hyperlipidemia, unspecified: Secondary | ICD-10-CM | POA: Diagnosis present

## 2024-03-07 DIAGNOSIS — Z888 Allergy status to other drugs, medicaments and biological substances status: Secondary | ICD-10-CM

## 2024-03-07 DIAGNOSIS — E119 Type 2 diabetes mellitus without complications: Secondary | ICD-10-CM | POA: Diagnosis present

## 2024-03-07 DIAGNOSIS — Z833 Family history of diabetes mellitus: Secondary | ICD-10-CM

## 2024-03-07 DIAGNOSIS — Z9181 History of falling: Secondary | ICD-10-CM

## 2024-03-07 DIAGNOSIS — R7303 Prediabetes: Secondary | ICD-10-CM | POA: Diagnosis present

## 2024-03-07 DIAGNOSIS — Z85828 Personal history of other malignant neoplasm of skin: Secondary | ICD-10-CM | POA: Diagnosis not present

## 2024-03-07 DIAGNOSIS — Z79899 Other long term (current) drug therapy: Secondary | ICD-10-CM

## 2024-03-07 DIAGNOSIS — K219 Gastro-esophageal reflux disease without esophagitis: Secondary | ICD-10-CM | POA: Diagnosis present

## 2024-03-07 DIAGNOSIS — Z8262 Family history of osteoporosis: Secondary | ICD-10-CM

## 2024-03-07 LAB — BASIC METABOLIC PANEL WITH GFR
Anion gap: 9 (ref 5–15)
BUN: 8 mg/dL (ref 8–23)
CO2: 24 mmol/L (ref 22–32)
Calcium: 7.9 mg/dL — ABNORMAL LOW (ref 8.9–10.3)
Chloride: 105 mmol/L (ref 98–111)
Creatinine, Ser: 0.73 mg/dL (ref 0.44–1.00)
GFR, Estimated: >60 mL/min (ref >60)
Glucose, Bld: 160 mg/dL — ABNORMAL HIGH (ref 70–99)
Potassium: 3.2 mmol/L — ABNORMAL LOW (ref 3.5–5.1)
Sodium: 138 mmol/L (ref 135–145)

## 2024-03-07 LAB — CBC
HCT: 28.7 % — ABNORMAL LOW (ref 36.0–46.0)
Hemoglobin: 9.1 g/dL — ABNORMAL LOW (ref 12.0–15.0)
MCH: 28.3 pg (ref 26.0–34.0)
MCHC: 31.7 g/dL (ref 30.0–36.0)
MCV: 89.1 fL (ref 80.0–100.0)
Platelets: 323 K/uL (ref 150–400)
RBC: 3.22 MIL/uL — ABNORMAL LOW (ref 3.87–5.11)
RDW: 13.6 % (ref 11.5–15.5)
WBC: 7.6 K/uL (ref 4.0–10.5)
nRBC: 0 % (ref 0.0–0.2)

## 2024-03-07 LAB — MAGNESIUM: Magnesium: 2 mg/dL (ref 1.7–2.4)

## 2024-03-07 MED ORDER — ALUM & MAG HYDROXIDE-SIMETH 200-200-20 MG/5ML PO SUSP: 30.0000 mL | ORAL | Status: DC | PRN

## 2024-03-07 MED ORDER — SIMVASTATIN 20 MG PO TABS
10.0000 mg | ORAL_TABLET | Freq: Every day | ORAL | Status: DC
  Administered 2024-03-07 – 2024-03-12 (×6): 10 mg via ORAL
  Filled 2024-03-07 (×6): qty 1

## 2024-03-07 MED ORDER — TRAMADOL HCL 50 MG PO TABS: 50.0000 mg | ORAL_TABLET | Freq: Four times a day (QID) | ORAL | Status: DC | PRN

## 2024-03-07 MED ORDER — CHLORHEXIDINE GLUCONATE CLOTH 2 % EX PADS
6.0000 | MEDICATED_PAD | Freq: Two times a day (BID) | CUTANEOUS | Status: DC
  Administered 2024-03-08: 6 via TOPICAL

## 2024-03-07 MED ORDER — CARVEDILOL 25 MG PO TABS: 25.0000 mg | ORAL_TABLET | Freq: Two times a day (BID) | ORAL | Status: DC

## 2024-03-07 MED ORDER — CARVEDILOL 25 MG PO TABS
25.0000 mg | ORAL_TABLET | Freq: Two times a day (BID) | ORAL | Status: DC
  Administered 2024-03-07 – 2024-03-11 (×8): 25 mg via ORAL
  Filled 2024-03-07 (×8): qty 1

## 2024-03-07 MED ORDER — FLUTICASONE PROPIONATE 50 MCG/ACT NA SUSP
1.0000 | Freq: Every day | NASAL | Status: DC | PRN
Start: 2024-03-07 — End: 2024-03-12

## 2024-03-07 MED ORDER — CHLORHEXIDINE GLUCONATE CLOTH 2 % EX PADS: 6.0000 | MEDICATED_PAD | Freq: Every day | CUTANEOUS | Status: DC

## 2024-03-07 MED ORDER — DIPHENOXYLATE-ATROPINE 2.5-0.025 MG PO TABS: 2.0000 | ORAL_TABLET | Freq: Four times a day (QID) | ORAL | Status: DC | PRN

## 2024-03-07 MED ORDER — PROCHLORPERAZINE 25 MG RE SUPP: 12.5000 mg | Freq: Four times a day (QID) | RECTAL | Status: DC | PRN

## 2024-03-07 MED ORDER — PANTOPRAZOLE SODIUM 40 MG PO TBEC
40.0000 mg | DELAYED_RELEASE_TABLET | Freq: Every day | ORAL | Status: DC
  Administered 2024-03-08 – 2024-03-10 (×3): 40 mg via ORAL
  Filled 2024-03-07 (×3): qty 1

## 2024-03-07 MED ORDER — METHOCARBAMOL 500 MG PO TABS: 500.0000 mg | ORAL_TABLET | Freq: Three times a day (TID) | ORAL | Status: DC | PRN

## 2024-03-07 MED ORDER — BISACODYL 10 MG RE SUPP: 10.0000 mg | Freq: Every day | RECTAL | Status: DC | PRN

## 2024-03-07 MED ORDER — TRAZODONE HCL 50 MG PO TABS: 25.0000 mg | ORAL_TABLET | Freq: Every evening | ORAL | Status: DC | PRN

## 2024-03-07 MED ORDER — PSYLLIUM 95 % PO PACK
1.0000 | PACK | Freq: Two times a day (BID) | ORAL | Status: DC
  Administered 2024-03-07 – 2024-03-12 (×10): 1 via ORAL
  Filled 2024-03-07 (×10): qty 1

## 2024-03-07 MED ORDER — RISAQUAD PO CAPS
1.0000 | ORAL_CAPSULE | Freq: Every day | ORAL | Status: DC
  Administered 2024-03-07 – 2024-03-10 (×4): 1 via ORAL
  Filled 2024-03-07 (×4): qty 1

## 2024-03-07 MED ORDER — POTASSIUM CHLORIDE ER 10 MEQ PO TBCR
20.0000 meq | EXTENDED_RELEASE_TABLET | Freq: Every day | ORAL | Status: DC
  Administered 2024-03-08 – 2024-03-12 (×5): 20 meq via ORAL
  Filled 2024-03-07 (×9): qty 2

## 2024-03-07 MED ORDER — SODIUM CHLORIDE 0.9% FLUSH
5.0000 mL | Freq: Three times a day (TID) | INTRAVENOUS | Status: DC
Start: 2024-03-07 — End: 2024-03-11
  Administered 2024-03-07 – 2024-03-11 (×11): 5 mL

## 2024-03-07 MED ORDER — ALPRAZOLAM 0.25 MG PO TABS
0.2500 mg | ORAL_TABLET | Freq: Every day | ORAL | Status: DC
  Administered 2024-03-07 – 2024-03-11 (×5): 0.25 mg via ORAL
  Filled 2024-03-07 (×5): qty 1

## 2024-03-07 MED ORDER — PROCHLORPERAZINE MALEATE 5 MG PO TABS: 5.0000 mg | ORAL_TABLET | Freq: Four times a day (QID) | ORAL | Status: DC | PRN

## 2024-03-07 MED ORDER — ENOXAPARIN SODIUM 40 MG/0.4ML IJ SOSY
40.0000 mg | PREFILLED_SYRINGE | INTRAMUSCULAR | Status: DC
  Administered 2024-03-08 – 2024-03-11 (×4): 40 mg via SUBCUTANEOUS
  Filled 2024-03-07 (×4): qty 0.4

## 2024-03-07 MED ORDER — PROBIOTIC PO TBEC: DELAYED_RELEASE_TABLET | Freq: Every evening | ORAL | Status: DC

## 2024-03-07 MED ORDER — METFORMIN HCL ER 500 MG PO TB24
1000.0000 mg | ORAL_TABLET | Freq: Every day | ORAL | Status: DC
  Administered 2024-03-07: 1000 mg via ORAL
  Filled 2024-03-07: qty 2

## 2024-03-07 MED ORDER — POTASSIUM CHLORIDE ER 10 MEQ PO TBCR: 20.0000 meq | EXTENDED_RELEASE_TABLET | Freq: Every day | ORAL | Status: DC

## 2024-03-07 MED ORDER — POTASSIUM CHLORIDE CRYS ER 20 MEQ PO TBCR: 20.0000 meq | EXTENDED_RELEASE_TABLET | Freq: Every day | ORAL | Status: DC

## 2024-03-07 MED ORDER — FUROSEMIDE 20 MG PO TABS
20.0000 mg | ORAL_TABLET | Freq: Every day | ORAL | Status: DC
  Administered 2024-03-08 – 2024-03-12 (×5): 20 mg via ORAL
  Filled 2024-03-07 (×5): qty 1

## 2024-03-07 MED ORDER — CARVEDILOL 25 MG PO TABS
25.0000 mg | ORAL_TABLET | Freq: Two times a day (BID) | ORAL | Status: DC
  Administered 2024-03-07: 25 mg via ORAL
  Filled 2024-03-07: qty 1

## 2024-03-07 MED ORDER — BUPROPION HCL ER (XL) 300 MG PO TB24
300.0000 mg | ORAL_TABLET | Freq: Every day | ORAL | Status: DC
  Administered 2024-03-08 – 2024-03-12 (×5): 300 mg via ORAL
  Filled 2024-03-07 (×5): qty 1

## 2024-03-07 MED ORDER — POTASSIUM CHLORIDE CRYS ER 20 MEQ PO TBCR
40.0000 meq | EXTENDED_RELEASE_TABLET | Freq: Once | ORAL | Status: AC
  Administered 2024-03-07: 40 meq via ORAL
  Filled 2024-03-07: qty 2

## 2024-03-07 MED ORDER — PROCHLORPERAZINE EDISYLATE 10 MG/2ML IJ SOLN: 5.0000 mg | Freq: Four times a day (QID) | INTRAMUSCULAR | Status: DC | PRN

## 2024-03-07 MED ORDER — VITAMIN D 25 MCG (1000 UNIT) PO TABS
5000.0000 [IU] | ORAL_TABLET | Freq: Every day | ORAL | Status: DC
  Administered 2024-03-07 – 2024-03-10 (×4): 5000 [IU] via ORAL
  Filled 2024-03-07 (×4): qty 5

## 2024-03-07 MED ORDER — PIPERACILLIN-TAZOBACTAM 3.375 G IVPB
3.3750 g | Freq: Three times a day (TID) | INTRAVENOUS | Status: DC
  Administered 2024-03-07 – 2024-03-10 (×8): 3.375 g via INTRAVENOUS
  Filled 2024-03-07 (×9): qty 50

## 2024-03-07 MED ORDER — METFORMIN HCL ER 500 MG PO TB24
1000.0000 mg | ORAL_TABLET | Freq: Every day | ORAL | Status: DC
  Administered 2024-03-08 – 2024-03-12 (×5): 1000 mg via ORAL
  Filled 2024-03-07 (×5): qty 2

## 2024-03-07 MED ORDER — ZINC OXIDE 40 % EX OINT
TOPICAL_OINTMENT | Freq: Four times a day (QID) | CUTANEOUS | Status: DC
  Filled 2024-03-07: qty 57

## 2024-03-07 MED ORDER — FLEET ENEMA RE ENEM: 1.0000 | ENEMA | Freq: Once | RECTAL | Status: DC | PRN

## 2024-03-07 MED ORDER — POLYETHYLENE GLYCOL 3350 17 G PO PACK: 17.0000 g | PACK | Freq: Every day | ORAL | Status: DC | PRN

## 2024-03-07 MED ORDER — CETIRIZINE HCL 10 MG PO TABS: 10.0000 mg | ORAL_TABLET | Freq: Every day | ORAL | Status: DC | PRN

## 2024-03-07 MED ORDER — ACETAMINOPHEN 500 MG PO TABS
1000.0000 mg | ORAL_TABLET | Freq: Four times a day (QID) | ORAL | Status: DC | PRN
  Administered 2024-03-08 – 2024-03-10 (×3): 1000 mg via ORAL
  Filled 2024-03-07 (×3): qty 2

## 2024-03-07 MED ORDER — ASPIRIN 81 MG PO TBEC
81.0000 mg | DELAYED_RELEASE_TABLET | Freq: Every day | ORAL | Status: DC
  Administered 2024-03-07 – 2024-03-12 (×6): 81 mg via ORAL
  Filled 2024-03-07 (×6): qty 1

## 2024-03-07 NOTE — Progress Notes (Signed)
 Inpatient Rehab Admissions Coordinator:    I have a rehab bed for this pt. On CIR today. RN may call report to [phone].  Pt. To admit to Cir for 5-7 days with the goal of dc home with family.   Leita Kleine, MS, CCC-SLP Rehab Admissions Coordinator  [phone] (celll) [phone] (office)

## 2024-03-07 NOTE — Progress Notes (Signed)
 Assigned nurse came and informed this charge nurse that patient and spouse had concerns that she was not able to ambulate due to no documented therapy evaluation. When this charge nurse talked to patient, she stated that she had dumping syndrome and when she has to go to the bathroom, she can't wait. She stated that she was evaluated on the 5th floor and she was walking fine on the floor. She stated that she was not told anything and she was told that she couldn't get up since she arrived to the floor. This charge nurse reviewed her notes and there was no note from the physical therapist. The only note from physical therapy stated the patient was not available due to a procedure. Patient stated ok; pt and spouse educated on calling for assist and please allow therapy to evaluate for safety. She finally said that it was not clear communication when she transferred to the unit. This charge nurse educated pt and spouse that since she came from another unit and the units are different and it is a whole new admission and not a transfer. 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 They also were informed that her breakfast would be arriving early in the morning around 6:30 am due to therapy starting early on the rehab. Both pt and spouse were educated and she stated that she would call for help. She thanked this press photographer and stated thanks for coming to talk to me.

## 2024-03-07 NOTE — H&P (Addendum)
 Physical Medicine and Rehabilitation Admission H&P    CC: Abdominal Pain: HPI: Kristine [name] is a 68 y.o. year old female  who  has a past medical history of Anxiety, Arthritis (2025), Basal cell carcinoma, Cataract (2019), Clotting disorder (2009), Depression (1989), DVT (deep venous thrombosis) (HCC) (05/13/2006), Esophageal erosions, Factor V Leiden, Gastritis (05/13/1988), GERD (gastroesophageal reflux disease), adenomatous polyp of colon (12/06/2022), Hyperlipidemia, Hypertension, and Radial head fracture (05/13/2009).  They presented to the hospital on 10 - 9 - 25 with abdominal pain and cramping starting the day prior; she had had similar episodes in the past lasting 2 to 3 weeks.  CT angio of the chest abdomen and pelvis revealed high-grade mid small bowel obstruction with right hemipelvis and proximal small bowel dilation up to 3 cm and decompressed distal bowel and colon.  CT was negative for PE or aortic syndrome.  Lactic acid negative.  Patient was admitted for further assessment and surgical consultation.  Surgery initially recommended NG tube placement for decompression and conservative care, however repeat x-ray 10-12 and 13 showed dilated loops of small bowel, and 8-hour delay contrast was still in her stomach.  She was taken to the OR on 10-13 for diagnostic laparoscopy with Dr. Vernetta, unfortunately upon entering the abdomen with the camera and inadvertent small bowel enterotomy was created in the dilated small bowel.  The procedure was converted to an open laparotomy with identified abdominal adhesion creating a significant stricture in the distal small bowel, with stricturoplasty performed.  Postop, her course was complicated by persistent ileus with large volume diarrhea.  On 10-19, she was noted to have purulent drainage from her from her abdominal incision, treated with broad-spectrum antibiotics.  CT 10-19 noting small bowel dilation up to 4 cm, and a thin walled pelvic  fluid collection between the uterus and bladder consistent with postoperative seroma.  She is advanced to a soft diet on 10-20 and was weaned off TPN. Repeat CT on 10-23 noted stable pelvic fluid collection, and midline anterior abdominal wall collection consistent with abscess or hematoma measuring 3.8 x 2.2 x 3.2 cm.  IR placed a drain in the pelvic fluid collection on 10-24 - culture pending.  Hospitalization was otherwise complicated by AKI, anasarca/moderate protein calorie malnutrition, hypokalemia, hypomagnesemia, prediabetes, hypertension, factor V Leiden with prior DVTs, GERD, and anxiety and depression.    Review of Systems  Constitutional:  Positive for malaise/fatigue. Negative for chills and fever.  Respiratory:  Negative for cough, hemoptysis and shortness of breath.   Cardiovascular:  Positive for leg swelling. Negative for chest pain and palpitations.  Gastrointestinal:  Positive for abdominal pain and diarrhea. Negative for heartburn, nausea and vomiting.  Neurological:  Negative for sensory change and focal weakness.  Psychiatric/Behavioral:  Negative for depression. The patient does not have insomnia.    Past Medical History:  Diagnosis Date   Anxiety    Arthritis 2025   Toe/knee   Basal cell carcinoma    face   Cataract 2019   Clotting disorder 2009   Factor 5   Depression 1989   DVT (deep venous thrombosis) (HCC) 05/13/2006   Esophageal erosions    Factor V Leiden    On coag x 1 year ending in 09   Gastritis 05/13/1988   hospital- abd pain   GERD (gastroesophageal reflux disease)    Hx of adenomatous polyp of colon 12/06/2022   5 mm rectal adenoma repeat colonoscopy 2031    Hyperlipidemia    Hypertension  Radial head fracture 05/13/2009   Past Surgical History:  Procedure Laterality Date   BIOPSY BREAST Right 10/2022   BREAST SURGERY  2006   Ductal Papilloma/2023 breast biopsy-benign/2024 breast biopsy -benign   CESAREAN SECTION  1987    CHOLECYSTECTOMY  01/27/2003   COLONOSCOPY     DILATION AND CURETTAGE OF UTERUS  08/22/2022   LAPAROSCOPY N/A 02/23/2024   Procedure: LAPAROSCOPY, DIAGNOSTIC;  Surgeon: Vernetta Berg, MD;  Location: [facility] OR;  Service: General;  Laterality: N/A;   LAPAROTOMY N/A 02/23/2024   Procedure: LAPAROTOMY, EXPLORATORY;  Surgeon: Vernetta Berg, MD;  Location: [facility] OR;  Service: General;  Laterality: N/A;   MOHS SURGERY  11/10/2009   basal cell carcinoma near eye   OOPHORECTOMY     SHOULDER SURGERY  04/12/2006   TONSILLECTOMY  05/13/1974   UPPER GASTROINTESTINAL ENDOSCOPY     Family History  Problem Relation Age of Onset   Graves' disease Mother    Depression Mother    Osteoporosis Mother    Hearing loss Mother    Hypertension Mother    Stroke Mother    Vision loss Mother    Varicose Veins Mother    Heart attack Father        x 3    Diabetes Father    Heart disease Father    Hypertension Father    Vision loss Father    Breast cancer Sister    Hypertension Sister    Colon polyps Brother    Heart attack Brother        x 2   Diabetes Brother    Obesity Brother    Cancer Brother    Heart disease Brother    Hypertension Brother    Hypertension Brother    Coronary artery disease Brother        with stents    Atrial fibrillation Brother    Hypertension Maternal Grandmother    Stroke Maternal Grandmother    Hypertension Maternal Grandfather    Stroke Maternal Grandfather    Heart disease Paternal Grandmother    Hypertension Paternal Grandmother    Hypertension Paternal Grandfather    Kidney disease Paternal Grandfather    Obesity Paternal Grandfather    Asthma Daughter    Heart disease Paternal Uncle    Heart disease Brother    Hypertension Brother    Heart disease Maternal Uncle    Hypertension Maternal Uncle    Diabetes Paternal Uncle    Heart disease Paternal Uncle    Hypertension Paternal Uncle    Diabetes Paternal Uncle    Heart disease Paternal Uncle     Hypertension Paternal Uncle    Heart disease Paternal Uncle    Hypertension Paternal Uncle    Diabetes Paternal Uncle    Heart disease Paternal Uncle    Hypertension Paternal Uncle    Cancer Sister    Hypertension Sister    Varicose Veins Sister    Anxiety disorder Maternal Aunt    Hypertension Maternal Aunt    Stroke Maternal Aunt    Hypertension Maternal Aunt    Stroke Maternal Aunt    Hypertension Paternal Uncle    COPD Maternal Uncle    Heart disease Maternal Uncle    Colon cancer Neg Hx    Esophageal cancer Neg Hx    Rectal cancer Neg Hx    Stomach cancer Neg Hx    Social History:  reports that she has never smoked. She has never used smokeless tobacco. She reports that she does  not currently use alcohol after a past usage of about 2.0 standard drinks of alcohol per week. She reports that she does not use drugs. Allergies:  Allergies  Allergen Reactions   Oxycodone-Acetaminophen     Hallucinations   Paroxetine Hives   Sulfa Antibiotics     Gi upset   Amoxicillin-Pot Clavulanate Other (See Comments)    REACTION: GI   Calcium     REACTION: muscle aches   Erythromycin Other (See Comments)    REACTION: GI   Guaifenesin     Feels yucky can tolerate lowest dosage in ER   Naproxen     REACTION: GI   Oxycodone-Aspirin     REACTION: reaction not known    Sertraline Rash and Other (See Comments)    REACTION: hives   Medications Prior to Admission  Medication Sig Dispense Refill   acetaminophen (TYLENOL) 500 MG tablet Take 1,000 mg by mouth every 6 (six) hours as needed.     aspirin EC 81 MG tablet Take 81 mg by mouth daily. Swallow whole.     buPROPion  (WELLBUTRIN  XL) 300 MG 24 hr tablet Take 1 tablet (300 mg total) by mouth daily. TAKE 1 TABLET BY MOUTH EVERY DAY 90 tablet 3   Cetirizine HCl (ZYRTEC PO) Take 1 tablet by mouth daily as needed (for allergies).     Cholecalciferol (VITAMIN D3 PO) Take 5,000 Units by mouth daily.     ciclopirox (PENLAC) 8 % solution  Apply 1 application  topically daily.     cyclobenzaprine  (FLEXERIL ) 10 MG tablet Take 0.5 tablets (5 mg total) by mouth 3 (three) times daily as needed for muscle spasms. 30 tablet 2   ESOMEPRAZOLE  MAGNESIUM  PO Take 1 tablet by mouth daily as needed.     fluticasone  (FLONASE ) 50 MCG/ACT nasal spray PLACE 2 SPRAYS INTO THE NOSE DAILY AS NEEDED 48 g 3   furosemide  (LASIX ) 20 MG tablet Twice weekly and as needed (Patient taking differently: Take 20 mg by mouth See admin instructions. Take 1 tablet by mouth daily as needed or twice weekly as needed for swelling) 90 tablet 3   loratadine (CLARITIN) 10 MG tablet Take 10 mg by mouth as needed for allergies. Reported on 05/19/2015     metFORMIN  (GLUCOPHAGE -XR) 500 MG 24 hr tablet Take 2 tablets (1,000 mg total) by mouth daily. 180 tablet 1   metoprolol  succinate (TOPROL -XL) 25 MG 24 hr tablet Take 1 tablet (25 mg total) by mouth daily. Take with or immediately following a meal. 90 tablet 3   Probiotic Product (PROBIOTIC PO) Take 1 tablet by mouth every evening. Align     simvastatin  (ZOCOR ) 10 MG tablet TAKE 1 TABLET BY MOUTH EVERY DAY 90 tablet 2   potassium chloride  (KLOR-CON ) 10 MEQ tablet Take 1 tablet (10 mEq total) by mouth daily. AND AS NEEDED WITH YOUR LASIX  (Patient taking differently: Take 10 mEq by mouth See admin instructions. Take 1 tablet by mouth along with Lasix  as needed for swelling) 90 tablet 3      Home: Plans for Discharge Living Setting: Patient's home, House, Lives with (comment) (Lives with husband.) Type of Home at Discharge: House Discharge Home Layout: Multi-level, Able to live on main level with bedroom/bathroom Alternate Level Stairs-Number of Steps: 14 Discharge Home Access: Stairs to enter Entrance Stairs-Rails: Right, Left, Can reach both Entrance Stairs-Number of Steps: 6 Discharge Bathroom Shower/Tub: Walk-in shower, Door Discharge Bathroom Toilet: Handicapped height Discharge Bathroom Accessibility: Yes How  Accessible: Accessible via walker Functional  History:  Self Care: Did the patient need help bathing, dressing, using the toilet or eating? Independent   Indoor Mobility: Did the patient need assistance with walking from room to room (with or without device)? Independent   Stairs: Did the patient need assistance with internal or external stairs (with or without device)? Independent   Functional Cognition: Did the patient need help planning regular tasks such as shopping or remembering to take medications? Independent  Functional Status:  Cognition   Orientation Level: Oriented X4    Extremity Assessment (includes Sensation/Coordination)   Upper Extremity Assessment: Generalized weakness  Lower Extremity Assessment: Generalized weakness     ADLs   Overall ADL's : Needs assistance/impaired Eating/Feeding: Set up, Sitting Eating/Feeding Details (indicate cue type and reason): self-feeding ice chips with set-up Grooming: Wash/dry hands, Oral care, Contact guard assist, Standing Grooming Details (indicate cue type and reason): Completed grooming tasks while standing at sink with CGA. Noted leaning against counter for support. Upper Body Bathing: Minimal assistance, Sitting Upper Body Bathing Details (indicate cue type and reason): Assist needed to bathe back Lower Body Bathing: Moderate assistance, Sitting/lateral leans Lower Body Bathing Details (indicate cue type and reason): LB bathing in recliner. Able to figure four LLE, increased assist needed for RLE. Slight lateral leans utilized to bathe posterior side of hips Upper Body Dressing : Minimal assistance, Sitting Lower Body Dressing: Maximal assistance Lower Body Dressing Details (indicate cue type and reason): Max A don/doff socks while seated in recliner d/t abdominal pain Toilet Transfer: Contact guard assist, Ambulation, Comfort height toilet Toilet Transfer Details (indicate cue type and reason): Pt furniture walked with CGA to  toilet Toileting- Clothing Manipulation and Hygiene: Contact guard assist, Sitting/lateral lean Toileting - Clothing Manipulation Details (indicate cue type and reason): not observed, pt. still using the b.room at end of session, but spouse is present and they both report he assists prn Functional mobility during ADLs: Contact guard assist, Rolling walker (2 wheels) General ADL Comments: Pt with improved engagement in ADL tasks this date, completing ADL routine in bathroom.     Mobility   Overal bed mobility: Needs Assistance Bed Mobility: Rolling, Sidelying to Sit Rolling: Contact guard assist, Used rails Sidelying to sit: Min assist, HOB elevated General bed mobility comments: Pt greeted in recliner and returned to recliner.     Transfers   Overall transfer level: Needs assistance Equipment used: Rolling walker (2 wheels), Rollator (4 wheels) Transfers: Sit to/from Stand Sit to Stand: Contact guard assist Bed to/from chair/wheelchair/BSC transfer type:: Stand pivot Step pivot transfers: Contact guard assist General transfer comment: Pt required CGA for safety and brief steadying assist. Pt utilized RW and rollator during session, expressing preference for RW as she felt more stability as compared to rollator. Pt engaged in functional ambulation around entire unit to facilitate OOB activity tolerance. Pt initiated recovery breaks throughout activity and therapist encouraged Pt to engage in deep breathing strategies to manage abdominal pain. Therapist monitored Pt response to tx tasks and adjusted activity demands as appropriate.     Ambulation / Gait / Stairs / Wheelchair Mobility   Ambulation/Gait Ambulation/Gait assistance: Editor, Commissioning (Feet): 150 Feet Assistive device: 1 person hand held assist, IV Pole, None Gait Pattern/deviations: Step-through pattern, Decreased step length - right, Decreased step length - left, Decreased stride length General Gait Details: Pt  ambulated with short slow steps with IV pole support on L and HHA on R, trial of ~20' without UE support with increased instability with ot  quick to fatigue Gait velocity: decr     Posture / Balance Dynamic Sitting Balance Sitting balance - Comments: Pt sat on toilet with no back support. Balance Overall balance assessment: Needs assistance Sitting-balance support: No upper extremity supported Sitting balance-Leahy Scale: Good Sitting balance - Comments: Pt sat on toilet with no back support. Standing balance support: Bilateral upper extremity supported, During functional activity, Reliant on assistive device for balance Standing balance-Leahy Scale: Poor Standing balance comment: Reliant on BUE support     Special considerations/life events  Skin Dressing in place over abdominal post op incision and Special service needs BID wet to dry dressing changes, IR needs to follow for possible drain removal    Physical Exam: Blood pressure (!) 151/78, pulse 78, temperature 98.1 F (36.7 C), temperature source Oral, resp. rate (!) 21, height 5' 5.5 (1.664 m), weight 89.5 kg, SpO2 100%. Constitutional: No apparent distress. Appropriate appearance for age.  Sitting upright in bedside chair. HENT: No JVD. Neck Supple. Trachea midline. Atraumatic, normocephalic. Eyes: PERRLA. EOMI. Visual fields grossly intact.  Cardiovascular: RRR, + murmur right parasternal border. 2+ b/l LE Edema.   Respiratory: CTAB. No rales, rhonchi, or wheezing. On RA.  Abdomen: + bowel sounds.  No gross tenderness to palpation.   Skin: C/D/I. No apparent lesions. + Midline abdominal incision -covered in ABD with small amount of serous drainage + Right flank/buttocks drain.  C-D-I, small amount of thick white and blood tinged fluid in tubing + Right PICC line, CDI + Left midline, CDI  MSK:      No apparent deformity.   Neurologic exam:  Cognition: AAO to person, place, time and event.   Language: Fluent, No  substitutions or neoglisms. No dysarthria. Names 3/3 objects correctly.   Memory: Recalls 3/3 objects at 5 minutes. No apparent deficits   Insight: Good   insight into current condition.  Mood: Pleasant affect, appropriate mood.  Sensation: Intact to light touch in BL UE and Les   Reflexes: 2+ in BL UE and LEs. Negative Hoffman's and babinski signs bilaterally.   CN: 2-12 grossly intact.   Coordination: No apparent tremors. No ataxia on FTN, HTS bilaterally.   Spasticity: MAS 0 in all extremities.        Strength:                RUE: 5/5 SA, 5/5 EF, 5/5 EE, 5/5 WE, 5/5 FF, 5/5 FA                  LUE: 5/5 SA, 5/5 EF, 5/5 EE, 5/5 WE, 5/5 FF, 5/5 FA                  RLE: 4/5 HF, 4/5 KE, 5/5 DF, 5/5 EHL, 5/5 PF                  LLE:  5/5 HF, 5/5 KE, 5/5 DF, 5/5 EHL, 5/5 PF       Results for orders placed or performed during the hospital encounter of 02/19/24 (from the past 48 hours)  Aerobic/Anaerobic Culture w Gram Stain (surgical/deep wound)     Status: None (Preliminary result)   Collection Time: 03/05/24  2:40 PM   Specimen: Abscess  Result Value Ref Range   Specimen Description ABSCESS    Special Requests PELVIS    Gram Stain      ABUNDANT WBC PRESENT, PREDOMINANTLY PMN NO ORGANISMS SEEN    Culture      NO GROWTH 2 DAYS  NO ANAEROBES ISOLATED; CULTURE IN PROGRESS FOR 5 DAYS Performed at [facility], [address], [place], [place] [address]    Report Status PENDING   Basic metabolic panel with GFR     Status: Abnormal   Collection Time: 03/06/24  8:46 AM  Result Value Ref Range   Sodium 137 135 - 145 mmol/L   Potassium 3.7 3.5 - 5.1 mmol/L   Chloride 104 98 - 111 mmol/L   CO2 23 22 - 32 mmol/L   Glucose, Bld 138 (H) 70 - 99 mg/dL    Comment: Glucose reference range applies only to samples taken after fasting for at least 8 hours.   BUN 7 (L) 8 - 23 mg/dL   Creatinine, Ser 9.25 0.44 - 1.00 mg/dL   Calcium 8.0 (L) 8.9 - 10.3 mg/dL   GFR, Estimated >39 >39 mL/min     Comment: (NOTE) Calculated using the CKD-EPI Creatinine Equation (2021)    Anion gap 10 5 - 15    Comment: Performed at [facility], [address], [place], [place] [address]  Basic metabolic panel with GFR     Status: Abnormal   Collection Time: 03/07/24  5:43 AM  Result Value Ref Range   Sodium 138 135 - 145 mmol/L   Potassium 3.2 (L) 3.5 - 5.1 mmol/L   Chloride 105 98 - 111 mmol/L   CO2 24 22 - 32 mmol/L   Glucose, Bld 160 (H) 70 - 99 mg/dL    Comment: Glucose reference range applies only to samples taken after fasting for at least 8 hours.   BUN 8 8 - 23 mg/dL   Creatinine, Ser 9.26 0.44 - 1.00 mg/dL   Calcium 7.9 (L) 8.9 - 10.3 mg/dL   GFR, Estimated >39 >39 mL/min    Comment: (NOTE) Calculated using the CKD-EPI Creatinine Equation (2021)    Anion gap 9 5 - 15    Comment: Performed at [facility], [address] [place], [place] [address]  Magnesium      Status: None   Collection Time: 03/07/24  5:43 AM  Result Value Ref Range   Magnesium  2.0 1.7 - 2.4 mg/dL    Comment: Performed at [facility], [address] [place], [place] [address]  CBC     Status: Abnormal   Collection Time: 03/07/24  5:43 AM  Result Value Ref Range   WBC 7.6 4.0 - 10.5 K/uL   RBC 3.22 (L) 3.87 - 5.11 MIL/uL   Hemoglobin 9.1 (L) 12.0 - 15.0 g/dL   HCT 71.2 (L) 63.9 - 53.9 %   MCV 89.1 80.0 - 100.0 fL   MCH 28.3 26.0 - 34.0 pg   MCHC 31.7 30.0 - 36.0 g/dL   RDW 86.3 88.4 - 84.4 %   Platelets 323 150 - 400 K/uL   nRBC 0.0 0.0 - 0.2 %    Comment: Performed at [facility], [address], [place], [place] [address]   CT GUIDED PERITONEAL/RETROPERITONEAL FLUID DRAIN BY [facility] CATH Result Date: 03/05/2024 INDICATION: Pelvic abscess EXAM: CT-GUIDED DRAINAGE OF THE POSTERIOR PELVIC ABSCESS MEDICATIONS: The patient is currently admitted to the hospital and receiving intravenous antibiotics. The antibiotics were administered within an appropriate time frame prior to the  initiation of the procedure. ANESTHESIA/SEDATION: Moderate (conscious) sedation was employed during this procedure. A total of Versed 2.5 mg and Fentanyl 150 mcg was administered intravenously by the radiology nurse. Total intra-service moderate Sedation Time: 13 minutes.  The patient's level of consciousness and vital signs were monitored continuously by radiology nursing throughout the procedure under my direct supervision. COMPLICATIONS: None immediate. PROCEDURE: Informed written consent was obtained from the patient after a thorough discussion of the procedural risks, benefits and alternatives. All questions were addressed. Maximal Sterile Barrier Technique was utilized including caps, mask, sterile gowns, sterile gloves, sterile drape, hand hygiene and skin antiseptic. A timeout was performed prior to the initiation of the procedure. previous imaging reviewed. patient positioned prone. noncontrast localization CT performed. the posterior pelvic abscess was localized and marked for a left transgluteal approach. Under sterile conditions and local anesthesia, the 18 gauge 15 cm access needle was advanced from a left transgluteal medial approach into the fluid collection. Needle position confirmed with CT. Syringe aspiration yielded purulent fluid. Sample sent for culture. Guidewire inserted followed by tract dilatation to insert a 10 French drain. Drain catheter position confirmed with CT. Syringe aspiration yielded a total of 30 cc purulent fluid. Catheter was secured with a silk suture and a sterile dressing. External suction bulb applied followed by sterile dressing. No immediate complication. Patient tolerated the procedure well. IMPRESSION: Successful CT guided pelvic abscess drain placement as above. Electronically Signed   By: CHRISTELLA.  Shick M.D.   On: 03/05/2024 15:11      There were no vitals taken for this visit.  Medical Problem List and Plan: 1. Functional deficits secondary to debility from small  bowel obstruction status post ex lap 10-19 Dr. Vernetta   -patient may not shower  -ELOS/Goals: 10 to 12 days, supervision PT/OT  - Stable for admission to inpatient rehab  2.  Antithrombotics/Hx DVTs d/t Factor V Leiden: -DVT/anticoagulation:  Pharmaceutical: Lovenox 40 mg daily  -antiplatelet therapy: ASA 81 mg daily --resumed by hospitalist just prior to rehab admission  - Negative bilateral lower extremity duplex 10-21  3. Pain Management: Has not been using PRNs much.  Consolidate to Tylenol 1000 mg every 6 hours as needed and tramadol 50 to 100 mg every 6 hours as needed for moderate to severe pain.  Robaxin 500 mg as needed.  4. Mood/Behavior/Sleep: Xanax  0.25 nightly, buproprion 300 mg daily  -antipsychotic agents: None   - Trazodone PRN added  5. Neuropsych/cognition: This patient is capable of making decisions on her own behalf. 6. Skin/Wound Care:    - Midline abdominal incision, change dressings daily and as needed   -  advised patient to use pillows and try to sleep on her right side to avoid putting pressure on plastic piece adhered to her back.   - Destin ointment to buttocks 4 times daily to prevent MASD  7. Fluids/Electrolytes/Nutrition:  AM labs pending.  Periodic hypomagnesemia/hypokalemia.  8.  Moderate protein calorie malnutrition.   - Dietary consult   - Soft diet, probiotics nightly  9.  Stubborn ileus/diarrhea.   - Patient endorses passing whole pills, Lasix  and K Dur.  Dietary order placed and nursing informed to crush pills when possible to assist in absorption.   - Psyllium twice daily   - Per surgery,  avoid Lomotil, imodium   - Monitor fluids closely, may need maintenance IV fluids  10.  Peripheral edema/anasarca  - On Lasix  20 mg daily; 2x weeks and as needed at home  - Potassium supplementation while on Lasix , 20 mill equivalents daily.  11.  Right pelvic abscess. JP Drain placed by IR 10-24  - Cultures pending   - Will need IR to follow-up on  drain care, timeline for  removal  12.  Postop abdominal wall infection.  - Continue Zosyn (started 10/20; will need to follow up with general surgery on duration)  13.  History of diabetes.  Hemoglobin A1c 5.8 on 12-2023.  Metformin  XR 1000 mg daily -- resumed by hospitalist just prior to inpatient rehab admission.   - Check blood sugars twice daily to start  14.  Hypertension.  Switched from Toprol -XL to Coreg 25 mg twice daily.  Lasix  as above.  15.  Allergies.  Has as needed Zyrtec, Flonase .   Joesph JAYSON Likes, DO 03/07/2024

## 2024-03-07 NOTE — Discharge Summary (Signed)
 DISCHARGE SUMMARY  OSIRIS CHARLES  MR#: [id number]  DOB:Oct 09, 1955  Date of Admission: 02/19/2024 Date of Discharge: 03/07/2024  Attending Physician:Nicosha Struve ONEIDA Moores, MD  Patient's ERE:Untzm, Laine LABOR, MD  Disposition: D/C to CIR   Discharge Diagnoses: Small bowel obstruction Postoperative ileus Surgical site infection Pelvic fluid collection / abscess v/s postoperative seroma Anasarca Hypokalemia Hypomagnesemia Prediabetes HTN Factor V Leiden mutation with prior DVTs HLD GERD Anxiety/depression Moderate Malnutrition   Initial presentation: 68 year old retired [employer] with a history of HTN, HLD, factor V Leiden mutation, DVT, GERD, anxiety/depression, cesarean section, cholecystectomy, tubal ligation, and right oophorectomy who presented to the ED 10/9 with the acute onset of cramping generalized abdominal pain with unrelenting nausea.  CT revealed a high-grade mid small bowel obstruction.  General Surgery evaluated the patient and she was admitted by [facility].   Following admission she unfortunately failed conservative therapy.  On 10/13 she underwent exploratory laparotomy with repair of a small bowel enterotomy as well as a small bowel stricturoplasty.  Her postoperative course was complicated by a stubborn ileus.  Follow-up CT 10/19 noted small bowel dilatation up to 4 cm with thick and angulated segments and a thin walled pelvic fluid collection between the uterus and bladder consistent with a postoperative seroma as well as generalized anasarca of the body wall.  Hospital Course:  Small bowel obstruction Status post exploratory laparotomy with stricturoplasty 10/13 after failed to improve with conservative therapy - postoperative care per General Surgery    Postoperative ileus With significant voluminous diarrhea - diarrhea improved with Imodium and Metamucil - was able to start a soft diet 10/20 - care per General Surgery - symptomatically has improved   Surgical  site infection On 10/19 the patient was found to have purulent drainage from her abdominal incision site - this has been treated with broad IV antibiotics - management per General Surgery - follow-up CT abdomen/pelvis 10/23 noted midline anterior abdominal wall collection consistent with abscess or hematoma measuring 3.8 x 2.2 x 3.2 cm   Pelvic fluid collection / abscess v/s postoperative seroma Noted on CT scan 10/19 - repeat imaging 10/23 suggested stability in size - to IR 10/24 for drain placement - culture revealing no growth thus far - drain care per IR/Gen Surgery    Anasarca Due to low albumin in the setting of necessary volume expansion due to acute kidney injury - has responded well to cessation of IV fluid and diuretic   Hypokalemia Continue to supplement and monitor intermittently - due to limited intake as well as use of diuretic    Hypomagnesemia Corrected with supplementation   Prediabetes A1c 5.8 12/23/2023 - no known prior history of diabetes - was on metformin  prior to admission for possible metabolic syndrome - resume metformin  10/26   HTN BP remains elevated above goal - likely related to pain/annoyance of gluteal drain - adjust meds today and monitor    Factor V Leiden mutation with prior DVTs Previously treated with 1 year of anticoagulation - no DVT on bilateral lower extremity venous duplex 10/21   HLD On Zocor  prior to this admission - holding due to limited intake   GERD Continue PPI   Anxiety/depression Continue usual bupropion  dose   Nutrition Problem: Moderate Malnutrition Etiology: acute illness (SBO/post op ileus) Signs/Symptoms: energy intake < or equal to 5  Allergies as of 03/07/2024       Reactions   Oxycodone-acetaminophen    Hallucinations   Paroxetine Hives   Sulfa Antibiotics    Gi  upset   Amoxicillin-pot Clavulanate Other (See Comments)   REACTION: GI   Calcium    REACTION: muscle aches   Erythromycin Other (See Comments)    REACTION: GI   Guaifenesin    Feels yucky can tolerate lowest dosage in ER   Naproxen    REACTION: GI   Oxycodone-aspirin    REACTION: reaction not known   Sertraline Rash, Other (See Comments)   REACTION: hives        Medication List     ASK your doctor about these medications    acetaminophen 500 MG tablet Commonly known as: TYLENOL Take 1,000 mg by mouth every 6 (six) hours as needed.   aspirin EC 81 MG tablet Take 81 mg by mouth daily. Swallow whole.   buPROPion  300 MG 24 hr tablet Commonly known as: WELLBUTRIN  XL Take 1 tablet (300 mg total) by mouth daily. TAKE 1 TABLET BY MOUTH EVERY DAY   ciclopirox 8 % solution Commonly known as: PENLAC Apply 1 application  topically daily.   cyclobenzaprine  10 MG tablet Commonly known as: FLEXERIL  Take 0.5 tablets (5 mg total) by mouth 3 (three) times daily as needed for muscle spasms.   ESOMEPRAZOLE  MAGNESIUM  PO Take 1 tablet by mouth daily as needed.   fluticasone  50 MCG/ACT nasal spray Commonly known as: FLONASE  PLACE 2 SPRAYS INTO THE NOSE DAILY AS NEEDED   furosemide  20 MG tablet Commonly known as: LASIX  Twice weekly and as needed   loratadine 10 MG tablet Commonly known as: CLARITIN Take 10 mg by mouth as needed for allergies. Reported on 05/19/2015   metFORMIN  500 MG 24 hr tablet Commonly known as: GLUCOPHAGE -XR Take 2 tablets (1,000 mg total) by mouth daily.   metoprolol  succinate 25 MG 24 hr tablet Commonly known as: TOPROL -XL Take 1 tablet (25 mg total) by mouth daily. Take with or immediately following a meal.   potassium chloride  10 MEQ tablet Commonly known as: KLOR-CON  Take 1 tablet (10 mEq total) by mouth daily. AND AS NEEDED WITH YOUR LASIX    PROBIOTIC PO Take 1 tablet by mouth every evening. Align   simvastatin  10 MG tablet Commonly known as: ZOCOR  TAKE 1 TABLET BY MOUTH EVERY DAY   VITAMIN D3 PO Take 5,000 Units by mouth daily.   ZYRTEC PO Take 1 tablet by mouth daily as needed  (for allergies).        Day of Discharge BP (!) 172/66 (BP Location: Left Wrist)   Pulse 70   Temp 98.3 F (36.8 C)   Resp 15   Ht 5' 5.5 (1.664 m)   Wt 81.6 kg   SpO2 97%   BMI 29.50 kg/m   Physical Exam: General: No acute respiratory distress Lungs: Clear to auscultation bilaterally  Cardiovascular: Regular rate and rhythm without murmur Abdomen: Nontender, nondistended, soft, bowel sounds positive, no rebound Extremities: 1+ edema bilateral lower extremities  Basic Metabolic Panel: Recent Labs  Lab 03/01/24 0358 03/02/24 0422 03/03/24 0235 03/05/24 0845 03/06/24 0846 03/07/24 0543  NA 137 136 136 139 137 138  K 4.3 4.3 4.3 3.6 3.7 3.2*  CL 105 100 101 105 104 105  CO2 24 28 27 25 23 24   GLUCOSE 200* 174* 139* 136* 138* 160*  BUN 9 13 13  7* 7* 8  CREATININE 0.54 0.58 0.70 0.80 0.74 0.73  CALCIUM 7.9* 8.1* 7.9* 8.2* 8.0* 7.9*  MG 1.9 2.1 2.2 2.0  --  2.0  PHOS 3.6 5.0* 4.8*  --   --   --  CBC: Recent Labs  Lab 03/01/24 1405 03/03/24 0235 03/04/24 1130 03/05/24 0845 03/07/24 0543  WBC 12.8* 12.5* 11.5* 9.8 7.6  HGB 10.7* 9.4* 10.4* 9.7* 9.1*  HCT 33.2* 29.5* 32.7* 30.4* 28.7*  MCV 88.5 88.9 89.8 89.1 89.1  PLT 183 229 310 348 323     Time spent in discharge (includes decision making & examination of pt): 35 minutes  03/07/2024, 4:37 PM   Reyes IVAR Moores, MD [facility] Office  [phone]

## 2024-03-07 NOTE — Progress Notes (Signed)
 Inpatient Rehabilitation Admission Medication Review by a Pharmacist  A complete drug regimen review was completed for this patient to identify any potential clinically significant medication issues.  High Risk Drug Classes Is patient taking? Indication by Medication  Antipsychotic Yes, as an intravenous medication Compazine - nausea  Anticoagulant Yes Lovenox - VTE prophylaxis  Antibiotic Yes, as an intravenous medication Zosyn - abdominal abscess  Opioid Yes Tramadol - pain  Antiplatelet Yes Aspirin - antiplatelet  Hypoglycemics/insulin Yes Metformin  - DM2  Vasoactive Medication Yes Carvedilol - HTN  Chemotherapy No   Other Yes Simvastatin  - cholesterol Bupropion  - mood Probiotic, Vitamin D3, KCl - supplements Cetirizine, Flonase  - allergies Alprazolam  - anxiety Lomotil, Metamucil - diarrhea Furosemide  - fluid Desitin - skin care  fleet enema , PEG , bisacodyl , and - constipation Maalox- indigestion Pantoprazole- reflux  Acetaminophen- pain  Robaxin- muscle spasms   trazodone and -insomnia     Type of Medication Issue Identified Description of Issue Recommendation(s)  Drug Interaction(s) (clinically significant)     Duplicate Therapy     Allergy     No Medication Administration End Date     Incorrect Dose     Additional Drug Therapy Needed     Significant med changes from prior encounter (inform family/care partners about these prior to discharge). Taking Toprol  XL PTA - now on Carvedilol.   PTA Penlac solution not resumed (nonformulary, continue to hold while in CIR). PTA esomeprazole  substituted with pantoprazole per formulary. PTA cyclobenzaprine  not resumed as currently receiving methocarbamol. Communicate relevant medication changes to patient/family members at discharge from CIR.   Restart or discontinue PTA meds not resumed in CIR at discharge if clinically indicated.   Other       Clinically significant medication issues were identified that warrant  physician communication and completion of prescribed/recommended actions by midnight of the next day:  No  Name of provider notified for urgent issues identified: N/A  Provider Method of Notification: N/A    Pharmacist comments: N/A  Time spent performing this drug regimen review (minutes):  20  Rocky Slade, PharmD, BCPS 03/07/2024 5:02 PM

## 2024-03-07 NOTE — Plan of Care (Signed)
  Problem: Consults Goal: RH GENERAL PATIENT EDUCATION Description: See Patient Education module for education specifics. Outcome: Progressing Goal: Nutrition Consult-if indicated Outcome: Progressing   Problem: RH BOWEL ELIMINATION Goal: RH STG MANAGE BOWEL WITH ASSISTANCE Description: STG Manage Bowel with mod I Assistance. Outcome: Progressing   Problem: RH SKIN INTEGRITY Goal: RH STG SKIN FREE OF INFECTION/BREAKDOWN Description: Manage w min assist Outcome: Progressing Goal: RH STG ABLE TO PERFORM INCISION/WOUND CARE W/ASSISTANCE Description: STG Able To Perform Incision/Wound Care With min Assistance. Outcome: Progressing   Problem: RH SAFETY Goal: RH STG ADHERE TO SAFETY PRECAUTIONS W/ASSISTANCE/DEVICE Description: STG Adhere to Safety Precautions With cues Assistance/Device. Outcome: Progressing   Problem: RH PAIN MANAGEMENT Goal: RH STG PAIN MANAGED AT OR BELOW PT'S PAIN GOAL Description: Pain < 4 with prns Outcome: Progressing   Problem: RH KNOWLEDGE DEFICIT GENERAL Goal: RH STG INCREASE KNOWLEDGE OF SELF CARE AFTER HOSPITALIZATION Description: Patient and spouse will be able to manage care at discharge using educational resources independently Outcome: Progressing

## 2024-03-07 NOTE — Progress Notes (Unsigned)
 {  Select_TRH_Note:[id number]}

## 2024-03-07 NOTE — Progress Notes (Signed)
 PMR Admission Coordinator Pre-Admission Assessment   Patient: Kristine [name] is an 68 y.o., female MRN: [id number] DOB: 12-Jan-1956 Height: 5' 5.5 (166.4 cm) Weight: 81.6 kg   Insurance Information HMO:     PPO:      PCP:      IPA:      80/20:      OTHER:  PRIMARY: Medicare A and B      Policy#: [id number]      Subscriber: self CM Name:        Phone#:       Fax#:   Pre-Cert#:        Employer:  Works PRN, retired engineer, civil (consulting) Benefits:  Phone #:      Name: Checked in passport one source [employer]. Date: 08/12/19     Deduct: $1676      Out of Pocket Max: none      Life Max: n/a CIR: 100%      SNF: 100 days Outpatient: 80%     Co-Pay: 20% Home Health: 100%      Co-Pay: none DME: 80%     Co-Pay: 20% Providers: patient's choice  SECONDARY: BCBS supplement      Policy#: [id number]     Phone#: [phone]   Financial Counselor:       Phone#:    The "Data Collection Information Summary" for patients in Inpatient Rehabilitation Facilities with attached "Privacy Act Statement-Health Care Records" was provided and verbally reviewed with: Patient   Emergency Contact Information Contact Information       Name Relation Home Work [place] [phone]   [phone]         Other Contacts   None on File        Current Medical History  Patient Admitting Diagnosis: Debility, SBO, Exp lap   History of Present Illness: Pt. Is a 68 y.o. female with past medical history of factor V Leiden, DVT, gastritis, HLD, and HTN who presented to [facility] ED 02/19/24 with c/o mid abdominal pain, cramp-like. CT revealed high-grade mid small bowel obstruction within the right hemipelvis with proximal small bowel dilatation up to 3 cm and decompressed distal bowel and colon. Pt s/p exploratory laparotomy with repair of small bowel enterotomy x 2 and small bowel stricturoplasty on 02/23/24. Her postoperative course was complicated by a stubborn ileus requiring TPN. Follow-up CT 10/19 noted  small bowel dilatation up to 4 cm with thick and angulated segments and a thin walled pelvic fluid collection between the uterus and bladder consistent with a postoperative seroma as well as generalized anasarca of the body wall. The patient underwent successful CT-guided left transgluteal pelvic abscess drain placement in IR 03/05/24.  PT/OT evaluations completed with recommendations for inpatient rehab admission.   Patient's medical record from [facility] has been reviewed by the rehabilitation admission coordinator and physician.   Past Medical History      Past Medical History:  Diagnosis Date   Anxiety     Arthritis 2025    Toe/knee   Basal cell carcinoma      face   Cataract 2019   Clotting disorder 2009    Factor 5   Depression 1989   DVT (deep venous thrombosis) (HCC) 05/13/2006   Esophageal erosions     Factor V Leiden      On coag x 1 year ending in 09   Gastritis 05/13/1988    hospital- abd pain   GERD (gastroesophageal reflux disease)  Hx of adenomatous polyp of colon 12/06/2022    5 mm rectal adenoma repeat colonoscopy 2031     Hyperlipidemia     Hypertension     Radial head fracture 05/13/2009          Has the patient had major surgery during 100 days prior to admission? Yes   Family History   family history includes Anxiety disorder in her maternal aunt; Asthma in her daughter; Atrial fibrillation in her brother; Breast cancer in her sister; COPD in her maternal uncle; Cancer in her brother and sister; Colon polyps in her brother; Coronary artery disease in her brother; Depression in her mother; Diabetes in her brother, father, paternal uncle, paternal uncle, and paternal uncle; Graves' disease in her mother; Hearing loss in her mother; Heart attack in her brother and father; Heart disease in her brother, brother, father, maternal uncle, maternal uncle, paternal grandmother, paternal uncle, paternal uncle, paternal uncle, paternal uncle, and paternal  uncle; Hypertension in her brother, brother, brother, father, maternal aunt, maternal aunt, maternal grandfather, maternal grandmother, maternal uncle, mother, paternal grandfather, paternal grandmother, paternal uncle, paternal uncle, paternal uncle, paternal uncle, paternal uncle, sister, and sister; Kidney disease in her paternal grandfather; Obesity in her brother and paternal grandfather; Osteoporosis in her mother; Stroke in her maternal aunt, maternal aunt, maternal grandfather, maternal grandmother, and mother; Varicose Veins in her mother and sister; Vision loss in her father and mother.   Current Medications  Current Medications    Current Facility-Administered Medications:    acetaminophen (TYLENOL) tablet 1,000 mg, 1,000 mg, Oral, TID, Simaan, Elizabeth S, PA-C, 1,000 mg at 03/07/24 9047   ALPRAZolam  (XANAX ) tablet 0.25 mg, 0.25 mg, Oral, QHS, Danton Purchase T, MD, 0.25 mg at 03/06/24 2153   buPROPion  (WELLBUTRIN  XL) 24 hr tablet 300 mg, 300 mg, Oral, Daily, Dahal, Binaya, MD, 300 mg at 03/07/24 0952   carvedilol (COREG) tablet 25 mg, 25 mg, Oral, BID WC, Danton Purchase DASEN, MD, 25 mg at 03/07/24 9047   Chlorhexidine Gluconate Cloth 2 % PADS 6 each, 6 each, Topical, Daily, Vernetta Berg, MD, 6 each at 03/06/24 1339   diphenoxylate-atropine (LOMOTIL) 2.5-0.025 MG per tablet 2 tablet, 2 tablet, Oral, Q8H PRN, Eletha Boas, MD, 2 tablet at 03/06/24 0956   enoxaparin (LOVENOX) injection 40 mg, 40 mg, Subcutaneous, Q24H, Dahal, Binaya, MD, 40 mg at 03/06/24 1334   fluticasone  (FLONASE ) 50 MCG/ACT nasal spray 2 spray, 2 spray, Each Nare, Daily PRN, Dahal, Chapman, MD, 2 spray at 03/05/24 0929   furosemide  (LASIX ) tablet 20 mg, 20 mg, Oral, Daily, Danton Purchase T, MD, 20 mg at 03/07/24 9047   HYDROmorphone (DILAUDID) injection 0.5 mg, 0.5 mg, Intravenous, Q2H PRN, Simaan, Elizabeth S, PA-C, 0.5 mg at 02/29/24 1822   liver oil-zinc oxide (DESITIN) 40 % ointment, , Topical, PRN, Eletha Boas, MD   liver oil-zinc oxide (DESITIN) 40 % ointment, , Topical, QID, Dahal, Binaya, MD, Given at 03/06/24 2222   methocarbamol (ROBAXIN) tablet 500 mg, 500 mg, Oral, Q8H PRN, Simaan, Elizabeth S, PA-C   pantoprazole (PROTONIX) EC tablet 40 mg, 40 mg, Oral, Daily, Chen, Lydia D, RPH, 40 mg at 03/07/24 0952   piperacillin-tazobactam (ZOSYN) IVPB 3.375 g, 3.375 g, Intravenous, Q8H, Dahal, Binaya, MD, Last Rate: 12.5 mL/hr at 03/07/24 0406, 3.375 g at 03/07/24 0406   [START ON 03/08/2024] potassium chloride  SA (KLOR-CON  M) CR tablet 20 mEq, 20 mEq, Oral, Daily, Danton, Purchase DASEN, MD   potassium chloride  SA (KLOR-CON  M) CR tablet  40 mEq, 40 mEq, Oral, Once, McClung, Reyes DASEN, MD   prochlorperazine (COMPAZINE) injection 10 mg, 10 mg, Intravenous, Q6H PRN, Vernetta Berg, MD, 10 mg at 03/04/24 0539   psyllium (HYDROCIL/METAMUCIL) 1 packet, 1 packet, Oral, BID, Eletha Boas, MD, 1 packet at 03/07/24 9047   sodium chloride  flush (NS) 0.9 % injection 10-40 mL, 10-40 mL, Intracatheter, Q12H, Vernetta Berg, MD, 10 mL at 03/06/24 2155   sodium chloride  flush (NS) 0.9 % injection 10-40 mL, 10-40 mL, Intracatheter, PRN, Dahal, Binaya, MD   sodium chloride  flush (NS) 0.9 % injection 5 mL, 5 mL, Intracatheter, Q8H, Shick, Michael, MD, 5 mL at 03/07/24 0410   traMADol (ULTRAM) tablet 100 mg, 100 mg, Oral, Q12H PRN, Simaan, Elizabeth S, PA-C   traMADol (ULTRAM) tablet 50 mg, 50 mg, Oral, Q6H PRN, Simaan, Elizabeth S, PA-C, 50 mg at 03/03/24 0417     Patients Current Diet:  Diet Order                  DIET SOFT Room service appropriate? Yes; Fluid consistency: Thin  Diet effective now                         Precautions / Restrictions Precautions Precautions: Fall, Other (comment) (abdominal) Precaution/Restrictions Comments: PICC; TPN Restrictions Weight Bearing Restrictions Per Provider Order: No    Has the patient had 2 or more falls or a fall with injury in the past year? 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   Prior Functional Level Self Care: Did the patient need help bathing, dressing, using the toilet or eating? Independent   Indoor Mobility: Did the patient need assistance with walking from room to room (with or without device)? Independent   Stairs: Did the patient need assistance with internal or external stairs (with or without device)? Independent   Functional Cognition: Did the patient need help planning regular tasks such as shopping or remembering to take medications? Independent   Patient Information Are you of Hispanic, Latino/a,or Spanish origin?: A. No, not of Hispanic, Latino/a, or Spanish origin What is your race?: A. White Do you need or want an interpreter to communicate with a doctor or health care staff?: 0. No Patient information obtained via proxy : No   Patient's Response To:  Health Literacy and Transportation Is the patient able to respond to health literacy and transportation needs?: Yes Health Literacy - How often do you need to have someone help you when you read instructions, pamphlets, or other written material from your doctor or pharmacy?: Never In the past 12 months, has lack of transportation kept you from medical appointments or from getting medications?: No In the past 12 months, has lack of transportation kept you from meetings, work, or from getting things needed for daily living?: No Higher Education Careers Adviser obtained via proxy: No   Journalist, Newspaper / Equipment Home Equipment: Agricultural Consultant (2 wheels), Rollator (4 wheels), [employer] - single point, Shower seat, Crutches, Hand held shower head   Prior Device Use: Indicate devices/aids used by the patient prior to current illness, exacerbation or injury? None of the above   Current Functional Level Cognition   Orientation Level: Oriented X4    Extremity  Assessment (includes Sensation/Coordination)   Upper Extremity Assessment: Generalized weakness  Lower Extremity Assessment: Generalized weakness     ADLs   Overall ADL's : Needs  assistance/impaired Eating/Feeding: Set up, Sitting Eating/Feeding Details (indicate cue type and reason): self-feeding ice chips with set-up Grooming: Wash/dry hands, Oral care, Contact guard assist, Standing Grooming Details (indicate cue type and reason): Completed grooming tasks while standing at sink with CGA. Noted leaning against counter for support. Upper Body Bathing: Minimal assistance, Sitting Upper Body Bathing Details (indicate cue type and reason): Assist needed to bathe back Lower Body Bathing: Moderate assistance, Sitting/lateral leans Lower Body Bathing Details (indicate cue type and reason): LB bathing in recliner. Able to figure four LLE, increased assist needed for RLE. Slight lateral leans utilized to bathe posterior side of hips Upper Body Dressing : Minimal assistance, Sitting Lower Body Dressing: Maximal assistance Lower Body Dressing Details (indicate cue type and reason): Max A don/doff socks while seated in recliner d/t abdominal pain Toilet Transfer: Contact guard assist, Ambulation, Comfort height toilet Toilet Transfer Details (indicate cue type and reason): Pt furniture walked with CGA to toilet Toileting- Clothing Manipulation and Hygiene: Contact guard assist, Sitting/lateral lean Toileting - Clothing Manipulation Details (indicate cue type and reason): not observed, pt. still using the b.room at end of session, but spouse is present and they both report he assists prn Functional mobility during ADLs: Contact guard assist, Rolling walker (2 wheels) General ADL Comments: Pt with improved engagement in ADL tasks this date, completing ADL routine in bathroom.     Mobility   Overal bed mobility: Needs Assistance Bed Mobility: Rolling, Sidelying to Sit Rolling: Contact guard assist,  Used rails Sidelying to sit: Min assist, HOB elevated General bed mobility comments: Pt greeted in recliner and returned to recliner.     Transfers   Overall transfer level: Needs assistance Equipment used: Rolling walker (2 wheels), Rollator (4 wheels) Transfers: Sit to/from Stand Sit to Stand: Contact guard assist Bed to/from chair/wheelchair/BSC transfer type:: Stand pivot Step pivot transfers: Contact guard assist General transfer comment: Pt required CGA for safety and brief steadying assist. Pt utilized RW and rollator during session, expressing preference for RW as she felt more stability as compared to rollator. Pt engaged in functional ambulation around entire unit to facilitate OOB activity tolerance. Pt initiated recovery breaks throughout activity and therapist encouraged Pt to engage in deep breathing strategies to manage abdominal pain. Therapist monitored Pt response to tx tasks and adjusted activity demands as appropriate.     Ambulation / Gait / Stairs / Wheelchair Mobility   Ambulation/Gait Ambulation/Gait assistance: Editor, Commissioning (Feet): 150 Feet Assistive device: 1 person hand held assist, IV Pole, None Gait Pattern/deviations: Step-through pattern, Decreased step length - right, Decreased step length - left, Decreased stride length General Gait Details: Pt ambulated with short slow steps with IV pole support on L and HHA on R, trial of ~20' without UE support with increased instability with ot quick to fatigue Gait velocity: decr     Posture / Balance Dynamic Sitting Balance Sitting balance - Comments: Pt sat on toilet with no back support. Balance Overall balance assessment: Needs assistance Sitting-balance support: No upper extremity supported Sitting balance-Leahy Scale: Good Sitting balance - Comments: Pt sat on toilet with no back support. Standing balance support: Bilateral upper extremity supported, During functional activity, Reliant on assistive  device for balance Standing balance-Leahy Scale: Poor Standing balance comment: Reliant on BUE support     Special considerations/life events  Skin Dressing in place over abdominal post op incision and Special service needs BID wet to dry dressing changes, IR needs to follow for possible drain removal  Previous Home Environment (from acute therapy documentation) Living Arrangements: Spouse/significant other Available Help at Discharge: Family, Available 24 hours/day Type of Home: House Home Layout: Multi-level, Able to live on main level with bedroom/bathroom Home Access: Stairs to enter Entrance Stairs-Rails: Right, Left, Can reach both Entrance Stairs-Number of Steps: 6 Bathroom Shower/Tub: Health Visitor: Handicapped height Bathroom Accessibility: Yes How Accessible: Accessible via wheelchair, Accessible via walker Home Care Services: No   Discharge Living Setting Plans for Discharge Living Setting: Patient's home, House, Lives with (comment) (Lives with husband.) Type of Home at Discharge: House Discharge Home Layout: Multi-level, Able to live on main level with bedroom/bathroom Alternate Level Stairs-Number of Steps: 14 Discharge Home Access: Stairs to enter Entrance Stairs-Rails: Right, Left, Can reach both Entrance Stairs-Number of Steps: 6 Discharge Bathroom Shower/Tub: Walk-in shower, Door Discharge Bathroom Toilet: Handicapped height Discharge Bathroom Accessibility: Yes How Accessible: Accessible via walker Does the patient have any problems obtaining your medications?: No   Social/Family/Support Systems Patient Roles: Spouse Contact Information: Maleny Candy - spouse - [phone] Anticipated Caregiver: Husband Ability/Limitations of Caregiver: Husband is reitred, works prn from home, can provide care and supervision Caregiver Availability: 24/7 Discharge Plan Discussed with Primary Caregiver: Yes Is Caregiver In Agreement with Plan?: Yes Does  Caregiver/Family have Issues with Lodging/Transportation while Pt is in Rehab?: No   Goals Patient/Family Goal for Rehab: PT/OT mod I and supervision goals Expected length of stay: 10-12 days Pt/Family Agrees to Admission and willing to participate: Yes Program Orientation Provided & Reviewed with Pt/Caregiver Including Roles  & Responsibilities: Yes   Decrease burden of Care through IP rehab admission: N/A   Possible need for SNF placement upon discharge: Not planned   Patient Condition: I have reviewed medical records from [facility], spoken with CM, and patient. I met with patient at the bedside for inpatient rehabilitation assessment.  Patient will benefit from ongoing PT and OT, can actively participate in 3 hours of therapy a day 5 days of the week, and can make measurable gains during the admission.  Patient will also benefit from the coordinated team approach during an Inpatient Acute Rehabilitation admission.  The patient will receive intensive therapy as well as Rehabilitation physician, nursing, social worker, and care management interventions.  Due to bladder management, bowel management, safety, skin/wound care, disease management, medication administration, pain management, and patient education the patient requires 24 hour a day rehabilitation nursing.  The patient is currently min A with mobility and basic ADLs.  Discharge setting and therapy post discharge at home with home health is anticipated.  Patient has agreed to participate in the Acute Inpatient Rehabilitation Program and will admit today.   Preadmission Screen Completed By:  Leita KATHEE Kleine, 03/07/2024 10:31 AM ______________________________________________________________________   Discussed status with Dr. Emeline  on 03/07/24 at 900 and received approval for admission today.   Admission Coordinator:  Leita KATHEE Kleine, CCC-SLP, time 1030/Date 10/26//25 with updates by Leita Kleine, MS, CCC-SLP     Assessment/Plan: Diagnosis: Debility secondary to small bowel obstruction status post ex lap with stricturoplasty 10-13 Does the need for close, 24 hr/day Medical supervision in concert with the patient's rehab needs make it unreasonable for this patient to be served in a less intensive setting? Yes Co-Morbidities requiring supervision/potential complications: SBO, surgical site infection, postop seroma, anasarca, electrolyte deficiencies, hypertension, Factor V lieden, GERD, anxiety/depression. Due to bladder management, bowel management, safety, skin/wound care, disease management, medication administration, pain management, and patient education, does the patient require 24  hr/day rehab nursing? Yes Does the patient require coordinated care of a physician, rehab nurse, PT, OT  to address physical and functional deficits in the context of the above medical diagnosis(es)? Yes Addressing deficits in the following areas: balance, endurance, locomotion, strength, transferring, bowel/bladder control, bathing, dressing, feeding, grooming, and toileting Can the patient actively participate in an intensive therapy program of at least 3 hrs of therapy 5 days a week? Yes The potential for patient to make measurable gains while on inpatient rehab is good Anticipated functional outcomes upon discharge from inpatient rehab: supervision PT, supervision OT Estimated rehab length of stay to reach the above functional goals is: 10-12 days Anticipated discharge destination: Home 10. Overall Rehab/Functional Prognosis: good     MD Signature:   Joesph JAYSON Likes, DO 03/07/2024

## 2024-03-07 NOTE — Plan of Care (Signed)
   Problem: Education: Goal: Knowledge of General Education information will improve Description: Including pain rating scale, medication(s)/side effects and non-pharmacologic comfort measures Outcome: Progressing   Problem: Clinical Measurements: Goal: Ability to maintain clinical measurements within normal limits will improve Outcome: Progressing Goal: Cardiovascular complication will be avoided Outcome: Progressing   Problem: Activity: Goal: Risk for activity intolerance will decrease Outcome: Progressing   Problem: Nutrition: Goal: Adequate nutrition will be maintained Outcome: Progressing

## 2024-03-07 NOTE — Progress Notes (Signed)
 13 Days Post-Op   Subjective/Chief Complaint: Pt noticing pills in BM    Objective: Vital signs in last 24 hours: Temp:  [97.7 F (36.5 C)-98.3 F (36.8 C)] 98.3 F (36.8 C) (10/26 0744) Pulse Rate:  [66-72] 70 (10/26 0744) Resp:  [15-18] 15 (10/26 0744) BP: (133-172)/(66-71) 172/66 (10/26 0744) SpO2:  [97 %-100 %] 97 % (10/26 0744) Last BM Date : 03/06/24  Intake/Output from previous day: 10/25 0701 - 10/26 0700 In: 750 [P.O.:720; I.V.:20] Out: -  Intake/Output this shift: Total I/O In: 360 [P.O.:360] Out: -   Incision/Wound: open area clean inferior border  Otherwise CDI  Erythema better  Soft  JP serous   Lab Results:  Recent Labs    03/05/24 0845 03/07/24 0543  WBC 9.8 7.6  HGB 9.7* 9.1*  HCT 30.4* 28.7*  PLT 348 323   BMET Recent Labs    03/06/24 0846 03/07/24 0543  NA 137 138  K 3.7 3.2*  CL 104 105  CO2 23 24  GLUCOSE 138* 160*  BUN 7* 8  CREATININE 0.74 0.73  CALCIUM 8.0* 7.9*   PT/INR Recent Labs    03/05/24 1207  LABPROT 13.5  INR 1.0   ABG No results for input(s): PHART, HCO3 in the last 72 hours.  Invalid input(s): PCO2, PO2  Studies/Results: CT GUIDED PERITONEAL/RETROPERITONEAL FLUID DRAIN BY PERC CATH Result Date: 03/05/2024 INDICATION: Pelvic abscess EXAM: CT-GUIDED DRAINAGE OF THE POSTERIOR PELVIC ABSCESS MEDICATIONS: The patient is currently admitted to the hospital and receiving intravenous antibiotics. The antibiotics were administered within an appropriate time frame prior to the initiation of the procedure. ANESTHESIA/SEDATION: Moderate (conscious) sedation was employed during this procedure. A total of Versed 2.5 mg and Fentanyl 150 mcg was administered intravenously by the radiology nurse. Total intra-service moderate Sedation Time: 13 minutes. The patient's level of consciousness and vital signs were monitored continuously by radiology nursing throughout the procedure under my direct supervision. COMPLICATIONS:  None immediate. PROCEDURE: Informed written consent was obtained from the patient after a thorough discussion of the procedural risks, benefits and alternatives. All questions were addressed. Maximal Sterile Barrier Technique was utilized including caps, mask, sterile gowns, sterile gloves, sterile drape, hand hygiene and skin antiseptic. A timeout was performed prior to the initiation of the procedure. previous imaging reviewed. patient positioned prone. noncontrast localization CT performed. the posterior pelvic abscess was localized and marked for a left transgluteal approach. Under sterile conditions and local anesthesia, the 18 gauge 15 cm access needle was advanced from a left transgluteal medial approach into the fluid collection. Needle position confirmed with CT. Syringe aspiration yielded purulent fluid. Sample sent for culture. Guidewire inserted followed by tract dilatation to insert a 10 French drain. Drain catheter position confirmed with CT. Syringe aspiration yielded a total of 30 cc purulent fluid. Catheter was secured with a silk suture and a sterile dressing. External suction bulb applied followed by sterile dressing. No immediate complication. Patient tolerated the procedure well. IMPRESSION: Successful CT guided pelvic abscess drain placement as above. Electronically Signed   By: CHRISTELLA.  Shick M.D.   On: 03/05/2024 15:11    Anti-infectives: Anti-infectives (From admission, onward)    Start     Dose/Rate Route Frequency Ordered Stop   03/01/24 1400  piperacillin-tazobactam (ZOSYN) IVPB 3.375 g        3.375 g 12.5 mL/hr over 240 Minutes Intravenous Every 8 hours 03/01/24 1249     02/23/24 1400  ceFAZolin (ANCEF) IVPB 1 g/50 mL premix  Status:  Discontinued  1 g 100 mL/hr over 30 Minutes Intravenous Every 8 hours 02/23/24 1222 02/25/24 1016       Assessment/Plan: s/p Procedure(s): LAPAROSCOPY, DIAGNOSTIC (N/A) LAPAROTOMY, EXPLORATORY (N/A) Ok for placement   LOS: 16 days     Debby DELENA Shipper MD  03/07/2024

## 2024-03-08 ENCOUNTER — Inpatient Hospital Stay (HOSPITAL_COMMUNITY)

## 2024-03-08 DIAGNOSIS — Z794 Long term (current) use of insulin: Secondary | ICD-10-CM

## 2024-03-08 DIAGNOSIS — S31109D Unspecified open wound of abdominal wall, unspecified quadrant without penetration into peritoneal cavity, subsequent encounter: Secondary | ICD-10-CM

## 2024-03-08 DIAGNOSIS — E119 Type 2 diabetes mellitus without complications: Secondary | ICD-10-CM

## 2024-03-08 DIAGNOSIS — I10 Essential (primary) hypertension: Secondary | ICD-10-CM

## 2024-03-08 DIAGNOSIS — N739 Female pelvic inflammatory disease, unspecified: Secondary | ICD-10-CM

## 2024-03-08 LAB — CBC WITH DIFFERENTIAL/PLATELET
Abs Immature Granulocytes: 0.12 K/uL — ABNORMAL HIGH (ref 0.00–0.07)
Basophils Absolute: 0.1 K/uL (ref 0.0–0.1)
Basophils Relative: 1 %
Eosinophils Absolute: 0.2 K/uL (ref 0.0–0.5)
Eosinophils Relative: 3 %
HCT: 33 % — ABNORMAL LOW (ref 36.0–46.0)
Hemoglobin: 10.3 g/dL — ABNORMAL LOW (ref 12.0–15.0)
Immature Granulocytes: 2 %
Lymphocytes Relative: 14 %
Lymphs Abs: 1.1 K/uL (ref 0.7–4.0)
MCH: 28 pg (ref 26.0–34.0)
MCHC: 31.2 g/dL (ref 30.0–36.0)
MCV: 89.7 fL (ref 80.0–100.0)
Monocytes Absolute: 0.6 K/uL (ref 0.1–1.0)
Monocytes Relative: 7 %
Neutro Abs: 5.6 K/uL (ref 1.7–7.7)
Neutrophils Relative %: 73 %
Platelets: 376 K/uL (ref 150–400)
RBC: 3.68 MIL/uL — ABNORMAL LOW (ref 3.87–5.11)
RDW: 13.2 % (ref 11.5–15.5)
WBC: 7.7 K/uL (ref 4.0–10.5)
nRBC: 0 % (ref 0.0–0.2)

## 2024-03-08 LAB — COMPREHENSIVE METABOLIC PANEL WITH GFR
ALT: 19 U/L (ref 0–44)
AST: 15 U/L (ref 15–41)
Albumin: 2.4 g/dL — ABNORMAL LOW (ref 3.5–5.0)
Alkaline Phosphatase: 95 U/L (ref 38–126)
Anion gap: 9 (ref 5–15)
BUN: 6 mg/dL — ABNORMAL LOW (ref 8–23)
CO2: 25 mmol/L (ref 22–32)
Calcium: 8.4 mg/dL — ABNORMAL LOW (ref 8.9–10.3)
Chloride: 104 mmol/L (ref 98–111)
Creatinine, Ser: 0.72 mg/dL (ref 0.44–1.00)
GFR, Estimated: >60 mL/min (ref >60)
Glucose, Bld: 195 mg/dL — ABNORMAL HIGH (ref 70–99)
Potassium: 4 mmol/L (ref 3.5–5.1)
Sodium: 138 mmol/L (ref 135–145)
Total Bilirubin: 0.4 mg/dL (ref 0.0–1.2)
Total Protein: 5.4 g/dL — ABNORMAL LOW (ref 6.5–8.1)

## 2024-03-08 LAB — GLUCOSE, CAPILLARY
Glucose-Capillary: 139 mg/dL — ABNORMAL HIGH (ref 70–99)
Glucose-Capillary: 142 mg/dL — ABNORMAL HIGH (ref 70–99)

## 2024-03-08 LAB — MAGNESIUM: Magnesium: 1.7 mg/dL (ref 1.7–2.4)

## 2024-03-08 MED ORDER — PROSOURCE PLUS PO LIQD
30.0000 mL | Freq: Two times a day (BID) | ORAL | Status: DC
  Administered 2024-03-08 – 2024-03-10 (×4): 30 mL via ORAL
  Filled 2024-03-08 (×5): qty 30

## 2024-03-08 MED ORDER — SODIUM CHLORIDE 0.9% FLUSH: 10.0000 mL | INTRAVENOUS | Status: DC | PRN

## 2024-03-08 MED ORDER — CHLORHEXIDINE GLUCONATE CLOTH 2 % EX PADS
6.0000 | MEDICATED_PAD | Freq: Two times a day (BID) | CUTANEOUS | Status: DC
  Administered 2024-03-08 – 2024-03-11 (×6): 6 via TOPICAL

## 2024-03-08 MED ORDER — SODIUM CHLORIDE 0.9% FLUSH
10.0000 mL | Freq: Two times a day (BID) | INTRAVENOUS | Status: DC
  Administered 2024-03-08 – 2024-03-10 (×5): 10 mL

## 2024-03-08 MED ORDER — IOHEXOL 9 MG/ML PO SOLN
500.0000 mL | ORAL | Status: AC
Start: 2024-03-08 — End: 2024-03-08
  Administered 2024-03-08 (×2): 500 mL via ORAL

## 2024-03-08 MED ORDER — SODIUM CHLORIDE 0.9% FLUSH
10.0000 mL | Freq: Two times a day (BID) | INTRAVENOUS | Status: DC
  Administered 2024-03-08 – 2024-03-11 (×6): 10 mL

## 2024-03-08 MED ORDER — AMLODIPINE BESYLATE 5 MG PO TABS
5.0000 mg | ORAL_TABLET | Freq: Every day | ORAL | Status: DC
  Administered 2024-03-08 – 2024-03-10 (×3): 5 mg via ORAL
  Filled 2024-03-08 (×3): qty 1

## 2024-03-08 MED ORDER — IOHEXOL 350 MG/ML SOLN
75.0000 mL | Freq: Once | INTRAVENOUS | Status: AC | PRN
  Administered 2024-03-08: 75 mL via INTRAVENOUS

## 2024-03-08 NOTE — Plan of Care (Signed)
  Problem: Consults Goal: RH GENERAL PATIENT EDUCATION Description: See Patient Education module for education specifics. Outcome: Progressing Goal: Nutrition Consult-if indicated Outcome: Progressing   Problem: RH BOWEL ELIMINATION Goal: RH STG MANAGE BOWEL WITH ASSISTANCE Description: STG Manage Bowel with mod I Assistance. Outcome: Progressing   Problem: RH SKIN INTEGRITY Goal: RH STG SKIN FREE OF INFECTION/BREAKDOWN Description: Manage w min assist Outcome: Progressing Goal: RH STG ABLE TO PERFORM INCISION/WOUND CARE W/ASSISTANCE Description: STG Able To Perform Incision/Wound Care With min Assistance. Outcome: Progressing   Problem: RH SAFETY Goal: RH STG ADHERE TO SAFETY PRECAUTIONS W/ASSISTANCE/DEVICE Description: STG Adhere to Safety Precautions With cues Assistance/Device. Outcome: Progressing   Problem: RH PAIN MANAGEMENT Goal: RH STG PAIN MANAGED AT OR BELOW PT'S PAIN GOAL Description: Pain < 4 with prns Outcome: Progressing   Problem: RH KNOWLEDGE DEFICIT GENERAL Goal: RH STG INCREASE KNOWLEDGE OF SELF CARE AFTER HOSPITALIZATION Description: Patient and spouse will be able to manage care at discharge using educational resources independently Outcome: Progressing

## 2024-03-08 NOTE — Plan of Care (Signed)
  Problem: RH Balance Goal: LTG Patient will maintain dynamic standing with ADLs (OT) Description: LTG:  Patient will maintain dynamic standing balance with assist during activities of daily living (OT)  Flowsheets (Taken 03/08/2024 1237) LTG: Pt will maintain dynamic standing balance during ADLs with: Independent   Problem: RH Dressing Goal: LTG Patient will perform upper body dressing (OT) Description: LTG Patient will perform upper body dressing with assist, with/without cues (OT). Flowsheets (Taken 03/08/2024 1237) LTG: Pt will perform upper body dressing with assistance level of: Independent Goal: LTG Patient will perform lower body dressing w/assist (OT) Description: LTG: Patient will perform lower body dressing with assist, with/without cues in positioning using equipment (OT) Flowsheets (Taken 03/08/2024 1237) LTG: Pt will perform lower body dressing with assistance level of: Independent with assistive device   Problem: RH Simple Meal Prep Goal: LTG Patient will perform simple meal prep w/assist (OT) Description: LTG: Patient will perform simple meal prep with assistance, with/without cues (OT). Flowsheets (Taken 03/08/2024 1237) LTG: Pt will perform simple meal prep with assistance level of: Independent with assistive device   Problem: RH Light Housekeeping Goal: LTG Patient will perform light housekeeping w/assist (OT) Description: LTG: Patient will perform light housekeeping with assistance, with/without cues (OT). Flowsheets (Taken 03/08/2024 1237) LTG: Pt will perform light housekeeping with assistance level of: Independent with assistive device   Problem: RH Pre-functional/Other (Specify) Goal: RH LTG OT (Specify) 1 Description: RH LTG OT (Specify) 1 Flowsheets (Taken 03/08/2024 1237) LTG: Other OT (Specify) 1: Pt will be able to safely ambulate in and out of the bathroom to access toilet with LRAD independently.   Problem: RH Pre-functional/Other (Specify) Goal: RH  LTG OT (Specify) 1 Description: RH LTG OT (Specify) 1 Flowsheets (Taken 03/08/2024 1237) LTG: Other OT (Specify) 1: Pt will be able to safely ambulate in and out of the bathroom to access toilet with LRAD independently.

## 2024-03-08 NOTE — Progress Notes (Addendum)
 Referring Physician(s): Almarie Pringle, PA (CCS)  Supervising Physician: Philip Cornet  Patient Status:  [facility] - In-pt  Chief Complaint:  S/p L TG drain by Dr. Vanice on 10/24 for pelvic fluid collection  Brief History:  This patient was admitted for SBO and required exploratory laparotomy with repair of a small bowel enterotomy as well as a small bowel stricturoplasty on 10/13 when conservative therapy failed. She developed a pelvic fluid collection postoperatively into which IR MD, Dr. Vanice placed L TG 10Fr drain on 03/05/24.   Subjective:  Patient is sitting in her chair, pleasant, excited to now be tolerating her clothing bottoms under gown. Her bowel movements are reportedly more firm, and she has been participating with therapy.   Endorses decreased abd pain and serous output. Notes that drain was not flushed for first few days but is now being flushed as directed with continued low output.   Allergies: Oxycodone-acetaminophen, Paroxetine, Sulfa antibiotics, Amoxicillin-pot clavulanate, Calcium, Erythromycin, Guaifenesin, Naproxen, Oxycodone-aspirin, and Sertraline  Medications: Prior to Admission medications   Medication Sig Start Date End Date Taking? Authorizing Provider  acetaminophen (TYLENOL) 500 MG tablet Take 1,000 mg by mouth every 6 (six) hours as needed.    [provider]  aspirin EC 81 MG tablet Take 81 mg by mouth daily. Swallow whole.    [provider]  buPROPion  (WELLBUTRIN  XL) 300 MG 24 hr tablet Take 1 tablet (300 mg total) by mouth daily. TAKE 1 TABLET BY MOUTH EVERY DAY 12/23/23   Tower, Laine LABOR, MD  Cetirizine HCl (ZYRTEC PO) Take 1 tablet by mouth daily as needed (for allergies). 03/13/20   [provider]  Cholecalciferol (VITAMIN D3 PO) Take 5,000 Units by mouth daily.    [provider]  ciclopirox (PENLAC) 8 % solution Apply 1 application  topically daily. 05/08/21   [provider]  cyclobenzaprine   (FLEXERIL ) 10 MG tablet Take 0.5 tablets (5 mg total) by mouth 3 (three) times daily as needed for muscle spasms. 12/23/23   Tower, Laine LABOR, MD  ESOMEPRAZOLE  MAGNESIUM  PO Take 1 tablet by mouth daily as needed. 02/11/20   [provider]  fluticasone  (FLONASE ) 50 MCG/ACT nasal spray PLACE 2 SPRAYS INTO THE NOSE DAILY AS NEEDED 04/03/18   Tower, Laine LABOR, MD  furosemide  (LASIX ) 20 MG tablet Twice weekly and as needed Patient taking differently: Take 20 mg by mouth See admin instructions. Take 1 tablet by mouth daily as needed or twice weekly as needed for swelling 09/29/23   Okey Vina GAILS, MD  metFORMIN  (GLUCOPHAGE -XR) 500 MG 24 hr tablet Take 2 tablets (1,000 mg total) by mouth daily. 01/23/24   Tower, Laine LABOR, MD  metoprolol  succinate (TOPROL -XL) 25 MG 24 hr tablet Take 1 tablet (25 mg total) by mouth daily. Take with or immediately following a meal. 09/29/23 02/19/25  Okey Vina GAILS, MD  potassium chloride  (KLOR-CON ) 10 MEQ tablet Take 1 tablet (10 mEq total) by mouth daily. AND AS NEEDED WITH YOUR LASIX  Patient taking differently: Take 10 mEq by mouth See admin instructions. Take 1 tablet by mouth along with Lasix  as needed for swelling 08/20/22   Parthenia Olivia HERO, PA-C  Probiotic Product (PROBIOTIC PO) Take 1 tablet by mouth every evening. Engineer, [employer], Historical, MD  simvastatin  (ZOCOR ) 10 MG tablet TAKE 1 TABLET BY MOUTH EVERY DAY 09/26/23   Tower, Laine LABOR, MD     Vital Signs: BP (!) 172/82 (BP Location: Left Arm)   Pulse 76  Temp 98.4 F (36.9 C) (Oral)   Resp 19   Ht 5' 5.5 (1.664 m)   Wt 197 lb 5 oz (89.5 kg)   SpO2 95%   BMI 32.34 kg/m   Physical Exam Constitutional:      Appearance: Normal appearance.  Pulmonary:     Effort: Pulmonary effort is normal.  Abdominal:     Palpations: Abdomen is soft.     Comments: Midline dressing   L TG drain unremarkable. Old serosanguinous drainage on gauze, no leakage with flush. No erythema or swelling. Ext securement devices in  place. Nontender. F/A easily with serous fluid in well charged bulb.   Neurological:     Mental Status: She is alert and oriented to person, place, and time.  Psychiatric:        Mood and Affect: Mood normal.        Behavior: Behavior normal.        Thought Content: Thought content normal.        Judgment: Judgment normal.     Imaging: CT GUIDED PERITONEAL/RETROPERITONEAL FLUID DRAIN BY PERC CATH Result Date: 03/05/2024 INDICATION: Pelvic abscess EXAM: CT-GUIDED DRAINAGE OF THE POSTERIOR PELVIC ABSCESS MEDICATIONS: The patient is currently admitted to the hospital and receiving intravenous antibiotics. The antibiotics were administered within an appropriate time frame prior to the initiation of the procedure. ANESTHESIA/SEDATION: Moderate (conscious) sedation was employed during this procedure. A total of Versed 2.5 mg and Fentanyl 150 mcg was administered intravenously by the radiology nurse. Total intra-service moderate Sedation Time: 13 minutes. The patient's level of consciousness and vital signs were monitored continuously by radiology nursing throughout the procedure under my direct supervision. COMPLICATIONS: None immediate. PROCEDURE: Informed written consent was obtained from the patient after a thorough discussion of the procedural risks, benefits and alternatives. All questions were addressed. Maximal Sterile Barrier Technique was utilized including caps, mask, sterile gowns, sterile gloves, sterile drape, hand hygiene and skin antiseptic. A timeout was performed prior to the initiation of the procedure. previous imaging reviewed. patient positioned prone. noncontrast localization CT performed. the posterior pelvic abscess was localized and marked for a left transgluteal approach. Under sterile conditions and local anesthesia, the 18 gauge 15 cm access needle was advanced from a left transgluteal medial approach into the fluid collection. Needle position confirmed with CT. Syringe aspiration  yielded purulent fluid. Sample sent for culture. Guidewire inserted followed by tract dilatation to insert a 10 French drain. Drain catheter position confirmed with CT. Syringe aspiration yielded a total of 30 cc purulent fluid. Catheter was secured with a silk suture and a sterile dressing. External suction bulb applied followed by sterile dressing. No immediate complication. Patient tolerated the procedure well. IMPRESSION: Successful CT guided pelvic abscess drain placement as above. Electronically Signed   By: CHRISTELLA.  Shick M.D.   On: 03/05/2024 15:11   CT ABDOMEN PELVIS W CONTRAST Result Date: 03/04/2024 EXAM: CT ABDOMEN AND PELVIS WITH CONTRAST 03/04/2024 12:23:00 PM TECHNIQUE: CT of the abdomen and pelvis was performed with the administration of 75 mL of iohexol (OMNIPAQUE) 350 MG/ML injection. Multiplanar reformatted images are provided for review. Automated exposure control, iterative reconstruction, and/or weight-based adjustment of the mA/kV was utilized to reduce the radiation dose to as low as reasonably achievable. COMPARISON: 02/29/2024 CLINICAL HISTORY: Intra-abdominal abscess. Abdominal Pain. FINDINGS: LOWER CHEST: No acute abnormality. LIVER: The liver is unremarkable. GALLBLADDER AND BILE DUCTS: Cholecystectomy. No biliary ductal dilatation. SPLEEN: No acute abnormality. PANCREAS: No acute abnormality. ADRENAL GLANDS: No acute abnormality. KIDNEYS,  URETERS AND BLADDER: 2 mm left kidney lower pole nonobstructive renal calculus. No stones in the ureters. No hydronephrosis. No perinephric or periureteral stranding. Urinary bladder is unremarkable. GI AND BOWEL: Stomach demonstrates no acute abnormality. Scattered small bowel wall thickening and mesenteric edema. There are dilated loops of small bowel up to 3.6 cm in diameter with transition to nondilated small bowel in the left abdomen at the site of a small bowel angulation. Distal small bowel loops are decompressed. There is no bowel obstruction.  PERITONEUM AND RETROPERITONEUM: Small amount of scattered free intraperitoneal gas, reduced from prior. There is a pelvic fluid collection with some higher density components along the anterior margin of the collection. The collection is 11.9 x 6.2 x 3.6 cm (volume = 140 cm). The collection is roughly similar to prior. VASCULATURE: Aorta is normal in caliber. LYMPH NODES: No lymphadenopathy. REPRODUCTIVE ORGANS: No acute abnormality. BONES AND SOFT TISSUES: Lower lumbar spondylosis and degenerative disc disease. Subcutaneous gas and edema along the left upper thigh, expected given the recent incision. There is a 3.8 x 2.2 x 3.2 cm (volume = 14 cm) collection along the left anterior abdominal wall at the midline incision site, previously 4.0 x 2.4 x 3.5 cm (volume = 18 cm). The collection could represent abscess or hematoma given the recent surgery. IMPRESSION: 1. Small bowel obstruction with transition in the left abdomen at a site of small bowel angulation. 2. Pelvic fluid collection, roughly similar to prior, measuring 11.9 x 6.2 x 3.6 cm (volume = 140 cm). 3. Left anterior abdominal wall collection at the midline incision site, possibly representing abscess or hematoma, measuring 3.8 x 2.2 x 3.2 cm (volume = 14 cm), previously 4.0 x 2.4 x 3.5 cm (volume = 18 cm). Electronically signed by: Lonni Necessary MD 03/04/2024 04:37 PM EDT RP Workstation: [id number]    Labs:  CBC: Recent Labs    03/03/24 0235 03/04/24 1130 03/05/24 0845 03/07/24 0543  WBC 12.5* 11.5* 9.8 7.6  HGB 9.4* 10.4* 9.7* 9.1*  HCT 29.5* 32.7* 30.4* 28.7*  PLT 229 310 348 323    COAGS: Recent Labs    03/05/24 1207  INR 1.0    BMP: Recent Labs    03/03/24 0235 03/05/24 0845 03/06/24 0846 03/07/24 0543  NA 136 139 137 138  K 4.3 3.6 3.7 3.2*  CL 101 105 104 105  CO2 27 25 23 24   GLUCOSE 139* 136* 138* 160*  BUN 13 7* 7* 8  CALCIUM 7.9* 8.2* 8.0* 7.9*  CREATININE 0.70 0.80 0.74 0.73  GFRNONAA >60  >60 >60 >60    LIVER FUNCTION TESTS: Recent Labs    02/26/24 0512 02/27/24 0655 02/28/24 0617 02/29/24 0331 03/01/24 0358 03/05/24 0845  BILITOT 1.0 0.7  --   --  0.4 0.5  AST 13* 15  --   --  22 20  ALT 14 13  --   --  24 20  ALKPHOS 64 71  --   --  88 103  PROT 4.7* 4.5*  --   --  4.1* 4.1*  ALBUMIN 2.1* 1.9* 1.7* 1.7* 1.6* 2.0*    Assessment and Plan:  Drain Location: L TG Size: Fr size: 10 Fr Date of placement: 03/05/24  Currently to: Drain collection device: suction bulb 24 hour output:  Output by Drain (mL) 03/06/24 0701 - 03/06/24 1900 03/06/24 1901 - 03/07/24 0700 03/07/24 0701 - 03/07/24 1900 03/07/24 1901 - 03/08/24 0700 03/08/24 0701 - 03/08/24 1042  Closed System Drain Left Buttock  Bulb (JP)   15 15     Interval imaging/drain manipulation:  CT AP will be ordered today with possible drain injection as well pending results.  Current examination: Flushes/aspirates easily.  Insertion site unremarkable. Suture and stat lock in place. Dressing with old serosang drainage. No leakage around insertion with flushing.  WBC WNL. Abd pain improved, denies fever, chills.  Output <10 cc daily x 2 days not including flush material. VSS, afebrile.    Plan:  CT AP w/ ordered, will likely recommend drain injection if collection resolved as ROS, labs, and exam suggest. Will need to touch base with [facility] before removing if this is appropriate after imaging.   In the meantime: Continue TID flushes with 5 cc NS. Record output Q shift. Dressing changes QD or PRN if soiled.  Call IR APP or on call IR MD if difficulty flushing or sudden change in drain output.    Discharge planning: Please contact IR APP or on call IR MD prior to patient d/c to ensure appropriate follow up plans are in place. Typically patient will follow up with IR clinic 10-14 days post d/c for repeat imaging/possible drain injection. IR scheduler will contact patient with date/time of appointment. Patient  will need to flush drain QD with 5 cc NS, record output QD, dressing changes every 2-3 days or earlier if soiled. This will be arranged if drain not ready for DC today.  IR will continue to follow - please call with questions or concerns.     Electronically Signed: Laymon Coast, NP 03/08/2024, 10:23 AM   I spent a total of 15 Minutes at the the patient's bedside AND on the patient's hospital floor or unit, greater than 50% of which was counseling/coordinating care for s/p L TG drain placement 10/24.

## 2024-03-08 NOTE — Evaluation (Signed)
 Physical Therapy Assessment and Plan  Patient Details  Name: Kristine [name] MRN: [id number] Date of Birth: 1956/01/28  PT Diagnosis: Abnormality of gait, Muscle weakness, Osteoarthritis, and Pain in joint Rehab Potential: Excellent ELOS: 3-5 day   Today's Date: 03/08/2024 PT Individual Time: 1350-1500 PT Individual Time Calculation (min): 70 min    Hospital Problem: Principal Problem:   Debility Active Problems:   Pelvic abscess in female   Past Medical History:  Past Medical History:  Diagnosis Date   Anxiety    Arthritis 2025   Toe/knee   Basal cell carcinoma    face   Cataract 2019   Clotting disorder 2009   Factor 5   Depression 1989   DVT (deep venous thrombosis) (HCC) 05/13/2006   Esophageal erosions    Factor V Leiden    On coag x 1 year ending in 09   Gastritis 05/13/1988   hospital- abd pain   GERD (gastroesophageal reflux disease)    Hx of adenomatous polyp of colon 12/06/2022   5 mm rectal adenoma repeat colonoscopy 2031    Hyperlipidemia    Hypertension    Radial head fracture 05/13/2009   Past Surgical History:  Past Surgical History:  Procedure Laterality Date   BIOPSY BREAST Right 10/2022   BREAST SURGERY  2006   Ductal Papilloma/2023 breast biopsy-benign/2024 breast biopsy -benign   CESAREAN SECTION  1987   CHOLECYSTECTOMY  01/27/2003   COLONOSCOPY     DILATION AND CURETTAGE OF UTERUS  08/22/2022   LAPAROSCOPY N/A 02/23/2024   Procedure: LAPAROSCOPY, DIAGNOSTIC;  Surgeon: Vernetta Berg, MD;  Location: [facility] OR;  Service: General;  Laterality: N/A;   LAPAROTOMY N/A 02/23/2024   Procedure: LAPAROTOMY, EXPLORATORY;  Surgeon: Vernetta Berg, MD;  Location: [facility] OR;  Service: General;  Laterality: N/A;   MOHS SURGERY  11/10/2009   basal cell carcinoma near eye   OOPHORECTOMY     SHOULDER SURGERY  04/12/2006   TONSILLECTOMY  05/13/1974   UPPER GASTROINTESTINAL ENDOSCOPY      Assessment & Plan Clinical Impression: Patient is a 68  y.o. year old female with recent admission to the hospital who has a past medical history of Anxiety, Arthritis (2025), Basal cell carcinoma, Cataract (2019), Clotting disorder (2009), Depression (1989), DVT (deep venous thrombosis) (HCC) (05/13/2006), Esophageal erosions, Factor V Leiden, Gastritis (05/13/1988), GERD (gastroesophageal reflux disease), adenomatous polyp of colon (12/06/2022), Hyperlipidemia, Hypertension, and Radial head fracture (05/13/2009).  They presented to the hospital on 10 - 9 - 25 with abdominal pain and cramping starting the day prior; she had had similar episodes in the past lasting 2 to 3 weeks.  CT angio of the chest abdomen and pelvis revealed high-grade mid small bowel obstruction with right hemipelvis and proximal small bowel dilation up to 3 cm and decompressed distal bowel and colon.  CT was negative for PE or aortic syndrome.  Lactic acid negative.  Patient was admitted for further assessment and surgical consultation.  Surgery initially recommended NG tube placement for decompression and conservative care, however repeat x-ray 10-12 and 13 showed dilated loops of small bowel, and 8-hour delay contrast was still in her stomach.  She was taken to the OR on 10-13 for diagnostic laparoscopy with Dr. Vernetta, unfortunately upon entering the abdomen with the camera and inadvertent small bowel enterotomy was created in the dilated small bowel.  The procedure was converted to an open laparotomy with identified abdominal adhesion creating a significant stricture in the distal small bowel, with stricturoplasty performed.  Postop, her course was complicated by persistent ileus with large volume diarrhea.  On 10-19, she was noted to have purulent drainage from her from her abdominal incision, treated with broad-spectrum antibiotics.  CT 10-19 noting small bowel dilation up to 4 cm, and a thin walled pelvic fluid collection between the uterus and bladder consistent with postoperative seroma.   She is advanced to a soft diet on 10-20 and was weaned off TPN. Repeat CT on 10-23 noted stable pelvic fluid collection, and midline anterior abdominal wall collection consistent with abscess or hematoma measuring 3.8 x 2.2 x 3.2 cm.  IR placed a drain in the pelvic fluid collection on 10-24 - culture pending.  Hospitalization was otherwise complicated by AKI, anasarca/moderate protein calorie malnutrition, hypokalemia, hypomagnesemia, prediabetes, hypertension, factor V Leiden with prior DVTs, GERD, and anxiety and depression.   Patient transferred to CIR on 03/07/2024 .   Patient currently requires supervision with mobility secondary to muscle weakness, decreased cardiorespiratoy endurance, and decreased standing balance and decreased balance strategies.  Prior to hospitalization, patient was independent  with mobility and lived with Spouse in a House home.  Home access is 6Stairs to enter.  Patient will benefit from skilled PT intervention to maximize safe functional mobility, minimize fall risk, and decrease caregiver burden for planned discharge home with 24 hour supervision.  Anticipate patient will benefit from follow up OP at discharge.  PT - End of Session Activity Tolerance: Tolerates 30+ min activity with multiple rests Endurance Deficit: Yes PT Assessment Rehab Potential (ACUTE/IP ONLY): Excellent PT Barriers to Discharge: None PT Patient demonstrates impairments in the following area(s): Balance;Endurance;Pain PT Transfers Functional Problem(s): Bed to Chair;Car;Furniture PT Locomotion Functional Problem(s): Ambulation;Stairs PT Plan PT Intensity: Minimum of 1-2 x/day ,45 to 90 minutes PT Frequency: 5 out of 7 days PT Duration Estimated Length of Stay: 3-5 day PT Treatment/Interventions: Ambulation/gait training;Balance/vestibular training;Community reintegration;Discharge planning;Disease management/prevention;DME/adaptive equipment instruction;Functional mobility  training;Functional electrical stimulation;Neuromuscular re-education;Pain management;Patient/family education;Psychosocial support;Skin care/wound management;Splinting/orthotics;Stair training;Therapeutic Activities;Therapeutic Exercise;UE/LE Strength taining/ROM;Wheelchair propulsion/positioning PT Transfers Anticipated Outcome(s): mod I/independent PT Locomotion Anticipated Outcome(s): independent household distances; mod I longer distances and for stairs PT Recommendation Recommendations for Other Services: Therapeutic Recreation consult Therapeutic Recreation Interventions: Outing/community reintergration;Kitchen group Follow Up Recommendations: Outpatient PT Patient destination: Home Equipment Recommended: None recommended by PT   PT Evaluation Precautions/Restrictions Precautions Precautions: Fall Recall of Precautions/Restrictions: Intact Precaution/Restrictions Comments: PICC; TPN, drains - no shower Restrictions Weight Bearing Restrictions Per Provider Order: No  Pain  Reports pain in abdomen and B knees but states it has improved overall. No interventions needed at this time. Pain Interference Pain Interference Pain Effect on Sleep: 3. Frequently Pain Interference with Therapy Activities: 1. Rarely or not at all Pain Interference with Day-to-Day Activities: 2. Occasionally Home Living/Prior Functioning Home Living Living Arrangements: Spouse/significant other Available Help at Discharge: Family;Available 24 hours/day Type of Home: House Home Access: Stairs to enter [employer] of Steps: 6 Entrance Stairs-Rails: Right;Left;Can reach both Home Layout: Multi-level;Able to live on main level with bedroom/bathroom Bathroom Shower/Tub: Health Visitor: Handicapped height  Lives With: Spouse Prior Function Level of Independence: Independent with basic ADLs;Independent with homemaking with ambulation;Independent with transfers;Independent with  gait  Able to Take Stairs?: Yes Driving: Yes Vocation: Part time employment Vision/Perception  Perception Perception: Within Functional Limits Praxis Praxis: WFL  Cognition Overall Cognitive Status: Within Functional Limits for tasks assessed Memory: Appears intact Awareness: Appears intact Problem Solving: Appears intact Safety/Judgment: Appears intact Sensation Sensation Light Touch: Appears Intact Proprioception: Appears  Intact Coordination Gross Motor Movements are Fluid and Coordinated: Yes Fine Motor Movements are Fluid and Coordinated: Yes Motor  Motor Motor: Within Functional Limits   Trunk/Postural Assessment  Cervical Assessment Cervical Assessment: Within Functional Limits Thoracic Assessment Thoracic Assessment: Within Functional Limits Lumbar Assessment Lumbar Assessment: Within Functional Limits Postural Control Postural Control: Within Functional Limits  Balance Balance Balance Assessed: Yes Standardized Balance Assessment Standardized Balance Assessment: Timed Up and Go Test Timed Up and Go Test TUG: Normal TUG (no device) Normal TUG (seconds): 11.33 (avg 3 trials) Static Sitting Balance Static Sitting - Level of Assistance: 7: Independent Dynamic Sitting Balance Dynamic Sitting - Level of Assistance: 7: Independent Static Standing Balance Static Standing - Level of Assistance: 7: Independent Dynamic Standing Balance Dynamic Standing - Level of Assistance: 6: Modified independent (Device/Increase time) Extremity Assessment      RLE Assessment RLE Assessment: Within Functional Limits General Strength Comments: grossly WFL, reports OA worse in R knee - increased pain and occasional buckle; decreased muscular endurance LLE Assessment LLE Assessment: Within Functional Limits General Strength Comments: grossly WFL; knee OA Bilaterally (more in R) with pain, decreased muscular endurance  Care Tool Care Tool Bed Mobility Roll left and right  activity   Roll left and right assist level: Independent    Sit to lying activity   Sit to lying assist level: Independent with assistive device    Lying to sitting on side of bed activity   Lying to sitting on side of bed assist level: the ability to move from lying on the back to sitting on the side of the bed with no back support.: Independent with assistive device     Care Tool Transfers Sit to stand transfer   Sit to stand assist level: Independent with assistive device Sit to stand assistive device: Armrests;Bedrails  Chair/bed transfer   Chair/bed transfer assist level: Independent with assistive device Chair/bed transfer assistive device: Network Engineer transfer assist level: Supervision/Verbal cueing      Care Tool Locomotion Ambulation   Assist level: Supervision/Verbal cueing Assistive device: No Device Max distance: 250  Walk 10 feet activity   Assist level: Supervision/Verbal cueing Assistive device: No Device   Walk 50 feet with 2 turns activity   Assist level: Supervision/Verbal cueing Assistive device: No Device  Walk 150 feet activity   Assist level: Supervision/Verbal cueing Assistive device: Other (comment) (pushing w/c)  Walk 10 feet on uneven surfaces activity   Assist level: Supervision/Verbal cueing    Stairs   Assist level: Supervision/Verbal cueing Stairs assistive device: 2 hand rails Max number of stairs: 12  Walk up/down 1 step activity   Walk up/down 1 step (curb) assist level: Contact Guard/Touching assist Walk up/down 1 step or curb assistive device: 1 hand rail  Walk up/down 4 steps activity   Walk up/down 4 steps assist level: Supervision/Verbal cueing Walk up/down 4 steps assistive device: 2 hand rails  Walk up/down 12 steps activity   Walk up/down 12 steps assist level: Supervision/Verbal cueing Walk up/down 12 steps assistive device: 2 hand rails  Pick up small objects from floor   Pick up small object from the  floor assist level: Supervision/Verbal cueing    Wheelchair Is the patient using a wheelchair?: No          Wheel 50 feet with 2 turns activity      Wheel 150 feet activity        Refer to Care Plan for Long Term Goals  SHORT TERM GOAL WEEK 1 PT Short Term Goal 1 (Week 1): = LTGS  Recommendations for other services: Adult Nurse group and Outing/community reintegration  Skilled Therapeutic Intervention  Evaluation completed (see details above and below) with education on PT POC and goals and individual treatment initiated with focus on functional gait training on unit without device and then with use of w/c for UE support to offload B knees due to pain/OA progressing towards mod I. Performed stair negotiation and car transfer assessment with S overall for tasks and cues for improved technique. Gait over uneven surface including ramp and mulched surface for community reintegration training with S. Discussed in future sessions going outside, or downstairs to gift shop or [employer] to practice community reintegration. Overall pt is doing very well functionally with mobility without device. We discussed use of AD for longer distance community ambulation for pain management (Knee OA) as well as energy conservation as she still does tire more easily from hospital debility. Pushed w/c during session and gait pattern improved as did her report of knee discomfort. Utlized Nustep on level 5 x 10 min for BLE/BUE strengthening, cardiovascular endurance training. Returned to room, used bathroom independently and set up in recliner with all needs in reach.  Mobility Bed Mobility Bed Mobility: Rolling Left;Supine to Sit;Sit to Supine Rolling Left: Independent with assistive device Supine to Sit: Independent with assistive device Sit to Supine: Independent with assistive device Transfers Transfers: Sit to Stand;Stand to Sit;Stand Pivot Transfers Sit to Stand: Independent with  assistive device Stand to Sit: Independent with assistive device Stand Pivot Transfers: Independent with assistive device Locomotion  Gait Gait Distance (Feet): 200 Feet Assistive device: None Gait Gait Pattern: Impaired (antalgic gait pattern (long standing OA in B knees)) Stairs / Additional Locomotion Stairs: Yes Stairs Assistance: Supervision/Verbal cueing Stair Management Technique: Two rails;Alternating pattern;Step to pattern Number of Stairs: 12 Height of Stairs: 6 Ramp: Supervision/Verbal cueing Curb: Contact Guard/Touching assist Wheelchair Mobility Wheelchair Mobility: No   Discharge Criteria: Patient will be discharged from PT if patient refuses treatment 3 consecutive times without medical reason, if treatment goals not met, if there is a change in medical status, if patient makes no progress towards goals or if patient is discharged from hospital.  The above assessment, treatment plan, treatment alternatives and goals were discussed and mutually agreed upon: by patient  Elnor Donald Sherrell Donald WENDI Elnor, PT, DPT, CBIS  03/08/2024, 3:35 PM

## 2024-03-08 NOTE — Progress Notes (Signed)
 The patient has gotten up out of bed twice without staff and is refusing to use bedpan. Pt was educated by me and charge nurse about calling out for assistance when needing to use the restroom. Pt was informed and educated on needing to be seen by physical therapy to be able to ambulate, patient stated she understood but was still going to get up to use the restroom. Pt has a bedside commode by her bed that was not place by me or any of the staff and will not allow us  to move it. Charge nurse informed.

## 2024-03-08 NOTE — Progress Notes (Signed)
 Patient ID: Kristine [name], female   DOB: Oct 27, 1955, 68 y.o.   MRN: [id number] Met with the patient to review current medical situation, rehab process, team conference and plan of care. Discussed JP drain management; Nurse to review procedure and given handout with drain record for discharge. Asking about IV abx., SW to arrange HH/[facility] notified per SW of home IV abx. IR just in room; plan for CT scan to assess current situation,and will make recommendations post scan. Discussed scheduled medications. Patient reports HTN and insomnia at present; little tired after morning sessions.  Continue to follow along to address educational needs to facilitate preparation for discharge. Kristine [name]

## 2024-03-08 NOTE — Progress Notes (Signed)
 Inpatient Rehabilitation  Patient information reviewed and entered into eRehab system by [facility]. Karen Kays., CCC/SLP, PPS Coordinator.  Information including medical coding, functional ability and quality indicators will be reviewed and updated through discharge.

## 2024-03-08 NOTE — Progress Notes (Signed)
 Initial Nutrition Assessment  DOCUMENTATION CODES:   Non-severe (moderate) malnutrition in context of acute illness/injury  INTERVENTION:  Juven BID for wound healing. Continue soft diet. Continue ProSource BID.   NUTRITION DIAGNOSIS:   Moderate Malnutrition related to acute illness as evidenced by energy intake < 75% for > 7 days, mild muscle depletion.   GOAL:   Patient will meet greater than or equal to 90% of their needs   MONITOR:   PO intake, Supplement acceptance, Weight trends  REASON FOR ASSESSMENT:   Consult Assessment of nutrition requirement/status, Wound healing  ASSESSMENT:   Rehab admission. PMH significant for mod PCM, recent admission for SBO s/p expl lap with small bowel enterotomy/stricturoplasty 02/23/24 complicated by post-op ileus requiring TPN as well as surgical site infection/abscess, HTN, dyslipidemia, factor V Leiden mutation, DVT, GERD, anxiety/depression.  Visited the patient who states that she ate very well for lunch today, which was the most she had eaten in a while. She ate almost 100% of grilled cheese, tomato soup, cottage cheese and pineapple. She is amenable to Juven for wound healing. She dislikes the taste of ONS and would like to defer that for now. She is drinking her ProSource.  Scheduled Meds:  (feeding supplement) PROSource Plus  30 mL Oral BID BM   acidophilus  1 capsule Oral Daily   ALPRAZolam   0.25 mg Oral QHS   amLODipine  5 mg Oral Daily   aspirin EC  81 mg Oral Daily   buPROPion   300 mg Oral Daily   carvedilol  25 mg Oral BID WC   Chlorhexidine Gluconate Cloth  6 each Topical Q12H   cholecalciferol  5,000 Units Oral Daily   enoxaparin (LOVENOX) injection  40 mg Subcutaneous Q24H   furosemide   20 mg Oral Daily   iohexol  500 mL Oral Q1H   liver oil-zinc oxide   Topical QID   metFORMIN   1,000 mg Oral Daily   pantoprazole  40 mg Oral Daily   potassium chloride   20 mEq Oral Daily   psyllium  1 packet Oral BID    simvastatin   10 mg Oral Daily   sodium chloride  flush  10-40 mL Intracatheter Q12H   sodium chloride  flush  10-40 mL Intracatheter Q12H   sodium chloride  flush  5 mL Intracatheter Q8H   Continuous Infusions:  piperacillin-tazobactam (ZOSYN)  IV 3.375 g (03/08/24 0603)   Labs:     I/O: -15 mL since admit  Meal Intake: 80-100% of meals the past 2 days     NUTRITION - FOCUSED PHYSICAL EXAM:  Flowsheet Row Most Recent Value  Orbital Region No depletion  Upper Arm Region Mild depletion  Thoracic and Lumbar Region No depletion  Buccal Region No depletion  Temple Region Mild depletion  Clavicle Bone Region Mild depletion  Clavicle and Acromion Bone Region No depletion  Scapular Bone Region Mild depletion  Dorsal Hand No depletion  Patellar Region No depletion  Anterior Thigh Region No depletion  Posterior Calf Region No depletion  Edema (RD Assessment) Mild  [non-pitting BLE]     Diet Order             DIET SOFT Room service appropriate? Yes; Fluid consistency: Thin  Diet effective now                   EDUCATION NEEDS:   Education needs have been addressed  Skin:  Skin Assessment: Skin Integrity Issues: Skin Integrity Issues:: Incisions Incisions: closed abdominal  Last BM:  10/26  type 2  Height:   Ht Readings from Last 1 Encounters:  03/07/24 5' 5.5 (1.664 m)    Weight:     Ideal Body Weight:  59 kg  BMI:  Body mass index is 32.34 kg/m.  Estimated Nutritional Needs:   Kcal:  1700-2000  Protein:  90-115  Fluid:  >1700    Leverne Ruth, MS, RDN, LDN [place]. [facility] See AMION for contact information

## 2024-03-08 NOTE — Progress Notes (Addendum)
 Inpatient Rehabilitation Care Coordinator Assessment and Plan Patient Details  Name: Kristine [name] MRN: [id number] Date of Birth: March 26, 1956  Today's Date: 03/08/2024  Hospital Problems: Principal Problem:   Debility  Past Medical History:  Past Medical History:  Diagnosis Date   Anxiety    Arthritis 2025   Toe/knee   Basal cell carcinoma    face   Cataract 2019   Clotting disorder 2009   Factor 5   Depression 1989   DVT (deep venous thrombosis) (HCC) 05/13/2006   Esophageal erosions    Factor V Leiden    On coag x 1 year ending in 09   Gastritis 05/13/1988   hospital- abd pain   GERD (gastroesophageal reflux disease)    Hx of adenomatous polyp of colon 12/06/2022   5 mm rectal adenoma repeat colonoscopy 2031    Hyperlipidemia    Hypertension    Radial head fracture 05/13/2009   Past Surgical History:  Past Surgical History:  Procedure Laterality Date   BIOPSY BREAST Right 10/2022   BREAST SURGERY  2006   Ductal Papilloma/2023 breast biopsy-benign/2024 breast biopsy -benign   CESAREAN SECTION  1987   CHOLECYSTECTOMY  01/27/2003   COLONOSCOPY     DILATION AND CURETTAGE OF UTERUS  08/22/2022   LAPAROSCOPY N/A 02/23/2024   Procedure: LAPAROSCOPY, DIAGNOSTIC;  Surgeon: Kristine Berg, MD;  Location: [facility] OR;  Service: General;  Laterality: N/A;   LAPAROTOMY N/A 02/23/2024   Procedure: LAPAROTOMY, EXPLORATORY;  Surgeon: Kristine Berg, MD;  Location: [facility] OR;  Service: General;  Laterality: N/A;   MOHS SURGERY  11/10/2009   basal cell carcinoma near eye   OOPHORECTOMY     SHOULDER SURGERY  04/12/2006   TONSILLECTOMY  05/13/1974   UPPER GASTROINTESTINAL ENDOSCOPY     Social History:  reports that she has never smoked. She has never used smokeless tobacco. She reports that she does not currently use alcohol after a past usage of about 2.0 standard drinks of alcohol per week. She reports that she does not use drugs.  Family / Support Systems Marital  Status: Married Patient Roles: Spouse Spouse/Significant Other: Kristine [name]  [phone] Other Supports: Friends and church members Anticipated Caregiver: [name] Ability/Limitations of Caregiver: Husband works part time from home but can assist if needed. Will need education on JP drain and IV antibitoics Caregiver Availability: 24/7 Family Dynamics: Close with friends and feels has good supports and will have her care needs met at home. 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 Return to Work Plans: Depends upon how she recovers from this Marine Scientist Issues: NA Guardian/Conservator: None-according to MD pt is capable of making her own decisions while here   Abuse/Neglect Abuse/Neglect Assessment Can Be Completed: Yes Physical Abuse: Denies Verbal Abuse: Denies Sexual Abuse: Denies Exploitation of patient/patient's resources: Denies Self-Neglect: Denies  Patient response to: Social Isolation - How often do you feel lonely or isolated from those around you?: Never  Emotional Status Pt's affect, behavior and adjustment status: Pt is motivated to do well but needs many answers to her questions regarding drain, IV antibitoics, etc. She was very independent prior to this and worked part time  out of the home Recent Psychosocial Issues: Other health issues Psychiatric History: HX-anxiety and depression takes medications for this but likes her questions answered she does not like not knowing and likes to be kept updated Substance Abuse History: NA  Patient / Family Perceptions, Expectations & Goals Pt/Family  understanding of illness & functional limitations: Pt is able to explain her health issues and reason for her hospitalization. She does talk with the MD's involved and NP from IR was just in to answer her questions regarding the drain. Pt feels better about this now Premorbid pt/family roles/activities: wife, nurse, church member, friend, etc Anticipated changes in roles/activities/participation: resume Pt/family expectations/goals: Pt states:  I know I am doing well mobility wise but want to make sure all is in place when I go home.  Community Resources [employer]: None Premorbid Home Care/DME Agencies: None Transportation available at discharge: self and husband Is the patient able to respond to transportation needs?: Yes In the past 12 months, has lack of transportation kept you from medical appointments or from getting medications?: No In the past 12 months, has lack of transportation kept you from meetings, work, or from getting things needed for daily living?: No Resource referrals recommended: Neuropsychology  Discharge Planning Living Arrangements: Spouse/significant other Support Systems: Spouse/significant other, Friends/neighbors, Psychologist, Clinical community Type of Residence: Private residence Community Education Officer Resources: [employer], Media Planner (specify) HERBALIST) Financial Resources: Employment, Tree Surgeon, Family Support Financial Screen Referred: No Living Expenses: Own Money Management: Patient, Spouse Does the patient have any problems obtaining your medications?: No Home Management: both she and husband Patient/Family Preliminary Plans: Return home with husband who is able to assist he does work part time from home. Will await team's evaluations. Aware she is high level physically but will need to get in place discharge needs. Need some answers regarding drain and length of IV antibiotics Care Coordinator Anticipated Follow Up Needs: HH/OP  Clinical  Impression Pleasant female who is motivated to do well but has many questions regarding her medical needs. She did speak with NP-from IR and got some of her answers regarding her drain, but will need length of time for IV antibiotics and have gotten [facility] involved to assist with IV antibiotics and education for home. Work on discharge needs. Gave Medicare.gov home health agency list  Raymonde Asberry MATSU 03/08/2024, 12:17 PM

## 2024-03-08 NOTE — Progress Notes (Signed)
 Inpatient Rehabilitation Center Individual Statement of Services  Patient Name:  Kristine [name]  Date:  03/08/2024  Welcome to the Inpatient Rehabilitation Center.  Our goal is to provide you with an individualized program based on your diagnosis and situation, designed to meet your specific needs.  With this comprehensive rehabilitation program, you will be expected to participate in at least 3 hours of rehabilitation therapies Monday-Friday, with modified therapy programming on the weekends.  Your rehabilitation program will include the following services:  Physical Therapy (PT), Occupational Therapy (OT), 24 hour per day rehabilitation nursing, Therapeutic Recreaction (TR), Neuropsychology, Care Coordinator, Rehabilitation Medicine, Nutrition Services, and Pharmacy Services  Weekly team conferences will be held on Wednesday to discuss your progress.  Your Inpatient Rehabilitation Care Coordinator will talk with you frequently to get your input and to update you on team discussions.  Team conferences with you and your family in attendance may also be held.  Expected length of stay: 3-6 days  Overall anticipated outcome: independent with device  Depending on your progress and recovery, your program may change. Your Inpatient Rehabilitation Care Coordinator will coordinate services and will keep you informed of any changes. Your Inpatient Rehabilitation Care Coordinator's name and contact numbers are listed  below.  The following services may also be recommended but are not provided by the Inpatient Rehabilitation Center:  Driving Evaluations Home Health Rehabiltiation Services Outpatient Rehabilitation Services Vocational Rehabilitation   Arrangements will be made to provide these services after discharge if needed.  Arrangements include referral to agencies that provide these services.  Your insurance has been verified to be:  Medicare & BCBS Your primary doctor is:  [facility]  Pertinent information will be shared with your doctor and your insurance company.  Inpatient Rehabilitation Care Coordinator:  Rhoda Clement, KEN [phone] or ELIGAH BASQUES  Information discussed with and copy given to patient by: Clement Asberry MATSU, 03/08/2024, 12:19 PM

## 2024-03-08 NOTE — Progress Notes (Signed)
 Occupational Therapy Session Note  Patient Details  Name: Kristine [name] MRN: [id number] Date of Birth: 04/25/1956  Today's Date: 03/08/2024 OT Individual Time: 8695-8654 OT Individual Time Calculation (min): 41 min    Short Term Goals: Week 1:  OT Short Term Goal 1 (Week 1): STGs = LTGs  Skilled Therapeutic Interventions/Progress Updates:  Pt received sitting in the recliner with no c/o pain, agreeable to OT session. She requested to get a hair wash. OT provided w/c and hair washing tray to assist pt with washing hair in the sink d/t abdominal wound prohibiting shower. She completed stand pivot to the w/c with close (S). OT provided hair washing with max A to increase self image and self efficacy for max participation and benefit of therapy. She completed bimanual hair drying using blow dryer to increase BUE endurance. She then completed toileting tasks with CGA overall. She returned to supine after OT notified RN of need for dressing change. She was left with nursing attending to wound care.    Therapy Documentation Precautions:  Precautions Precautions: Fall Precaution/Restrictions Comments: PICC; TPN, drains - no shower Restrictions Weight Bearing Restrictions Per Provider Order: No   Therapy/Group: Individual Therapy  Nena VEAR Moats 03/08/2024, 1:25 PM

## 2024-03-08 NOTE — Evaluation (Signed)
 Occupational Therapy Assessment and Plan  Patient Details  Name: Kristine [name] MRN: [id number] Date of Birth: [birth year]  OT Diagnosis: deconditioning from prolonged hospital stay and surgeries Rehab Potential: Rehab Potential (ACUTE ONLY): Excellent ELOS: 3-5 days   Today's Date: 03/08/2024 OT Individual Time: 9169-9054 OT Individual Time Calculation (min): 75 min      Pt seen for initial evaluation and ADL training with a focus on functional mobility.  Pt has good awareness and safety judgement and is able to complete basic mobility of sit to stands, stand pivot transfers with mod ind.   She ambulates with S either using the RW or the IV pole for support as she has B knee arthritis bone on bone per pt. Pt completed bathing with set up, dressing set up,  she is eager to have her hair washed so will communicate with the next OT.   Pt demonstrated the ability to transfer bed to [facility] with mod ind. Set up her supplies in a bucket and placed trashcan nearby.  Told her to make sure all equipment is nearby the recliner and or bed before staff leave so she can access supplies easily. Pt then walked down hallway to nursing session using IV pole for support with supervision.   She asked if her spouse could be permitted to walk her to the bathroom.  Indicated that was fine on the safety plan.  Pt does not need alarms on so she can use BSC as needed.   Reviewed role of OT, discussed POC and pt's goals, and ELOS.  Pt stated she is feeling comfortable with her physical skills but feels her endurance is low from prolonged hospital stay and surgeries. Her main concern is making sure she and her spouse can cover her medical needs (wound care, drains, etc) Pt resting in recliner with all needs met.  Hospital Problem: Principal Problem:   Debility   Past Medical History:  Past Medical History:  Diagnosis Date   Anxiety    Arthritis 2025   Toe/knee   Basal cell carcinoma    face   Cataract  2019   Clotting disorder 2009   Factor 5   Depression 1989   DVT (deep venous thrombosis) (HCC) 05/13/2006   Esophageal erosions    Factor V Leiden    On coag x 1 year ending in 09   Gastritis 05/13/1988   hospital- abd pain   GERD (gastroesophageal reflux disease)    Hx of adenomatous polyp of colon 12/06/2022   5 mm rectal adenoma repeat colonoscopy 2031    Hyperlipidemia    Hypertension    Radial head fracture 05/13/2009   Past Surgical History:  Past Surgical History:  Procedure Laterality Date   BIOPSY BREAST Right 10/2022   BREAST SURGERY  2006   Ductal Papilloma/2023 breast biopsy-benign/2024 breast biopsy -benign   CESAREAN SECTION  1987   CHOLECYSTECTOMY  01/27/2003   COLONOSCOPY     DILATION AND CURETTAGE OF UTERUS  08/22/2022   LAPAROSCOPY N/A 02/23/2024   Procedure: LAPAROSCOPY, DIAGNOSTIC;  Surgeon: Vernetta Berg, MD;  Location: [facility] OR;  Service: General;  Laterality: N/A;   LAPAROTOMY N/A 02/23/2024   Procedure: LAPAROTOMY, EXPLORATORY;  Surgeon: Vernetta Berg, MD;  Location: [facility] OR;  Service: General;  Laterality: N/A;   MOHS SURGERY  11/10/2009   basal cell carcinoma near eye   OOPHORECTOMY     SHOULDER SURGERY  04/12/2006   TONSILLECTOMY  05/13/1974   UPPER GASTROINTESTINAL ENDOSCOPY  Assessment & Plan Clinical Impression: Kristine [name] is a 68 y.o. year old female  who  has a past medical history of Anxiety, Arthritis (2025), Basal cell carcinoma, Cataract (2019), Clotting disorder (2009), Depression (1989), DVT (deep venous thrombosis) (HCC) (05/13/2006), Esophageal erosions, Factor V Leiden, Gastritis (05/13/1988), GERD (gastroesophageal reflux disease), adenomatous polyp of colon (12/06/2022), Hyperlipidemia, Hypertension, and Radial head fracture (05/13/2009).  They presented to the hospital on 10 - 9 - 25 with abdominal pain and cramping starting the day prior; she had had similar episodes in the past lasting 2 to 3 weeks.  CT angio of  the chest abdomen and pelvis revealed high-grade mid small bowel obstruction with right hemipelvis and proximal small bowel dilation up to 3 cm and decompressed distal bowel and colon.  CT was negative for PE or aortic syndrome.  Lactic acid negative.  Patient was admitted for further assessment and surgical consultation.  Surgery initially recommended NG tube placement for decompression and conservative care, however repeat x-ray 10-12 and 13 showed dilated loops of small bowel, and 8-hour delay contrast was still in her stomach.  She was taken to the OR on 10-13 for diagnostic laparoscopy with Dr. Vernetta, unfortunately upon entering the abdomen with the camera and inadvertent small bowel enterotomy was created in the dilated small bowel.  The procedure was converted to an open laparotomy with identified abdominal adhesion creating a significant stricture in the distal small bowel, with stricturoplasty performed.  Postop, her course was complicated by persistent ileus with large volume diarrhea.  On 10-19, she was noted to have purulent drainage from her from her abdominal incision, treated with broad-spectrum antibiotics.  CT 10-19 noting small bowel dilation up to 4 cm, and a thin walled pelvic fluid collection between the uterus and bladder consistent with postoperative seroma.  She is advanced to a soft diet on 10-20 and was weaned off TPN. Repeat CT on 10-23 noted stable pelvic fluid collection, and midline anterior abdominal wall collection consistent with abscess or hematoma measuring 3.8 x 2.2 x 3.2 cm.  IR placed a drain in the pelvic fluid collection on 10-24 - culture pending.  Hospitalization was otherwise complicated by AKI, anasarca/moderate protein calorie malnutrition, hypokalemia, hypomagnesemia, prediabetes, hypertension, factor V Leiden with prior DVTs, GERD, and anxiety and depression.  .  Patient transferred to CIR on 03/07/2024 .    Patient currently requires set up A with basic self-care  skills secondary to decreased cardiorespiratoy endurance and decreased standing balance due to low endurance.   Prior to hospitalization, patient was fully independent.   Patient will benefit from skilled intervention to increase independence with basic self-care skills and increase level of independence with iADL prior to discharge home with care partner.  Anticipate patient will require intermittent supervision and follow up home health.  OT - End of Session Activity Tolerance: Tolerates 10 - 20 min activity with multiple rests Endurance Deficit: Yes OT Assessment Rehab Potential (ACUTE ONLY): Excellent OT Barriers to Discharge: None OT Patient demonstrates impairments in the following area(s): Endurance;Balance OT Basic ADL's Functional Problem(s): Dressing OT Advanced ADL's Functional Problem(s): Simple Meal Preparation;Light Housekeeping OT Additional Impairment(s): None OT Plan OT Intensity: Minimum of 1-2 x/day, 45 to 90 minutes OT Frequency: 5 out of 7 days OT Duration/Estimated Length of Stay: 3-5 days OT Treatment/Interventions: Balance/vestibular training;Discharge planning;DME/adaptive equipment instruction;Functional mobility training;Self Care/advanced ADL retraining;Therapeutic Exercise;UE/LE Strength taining/ROM;Therapeutic Activities OT Self Feeding Anticipated Outcome(s): no goal pt is independent OT Basic Self-Care Anticipated Outcome(s): mod ind OT  Toileting Anticipated Outcome(s): no goal, pt is mod ind OT Bathroom Transfers Anticipated Outcome(s): mod ind to ambulate to toilet OT Recommendation Recommendations for Other Services: None Patient destination: Home Follow Up Recommendations: Home health OT Equipment Recommended: 3 in 1 bedside comode   OT Evaluation Precautions/Restrictions  Precautions Precautions: Fall Precaution/Restrictions Comments: PICC; TPN, drains - no shower Restrictions Weight Bearing Restrictions Per Provider Order: No   Pain No c/o  pain during session  Home Living/Prior Functioning Home Living Family/patient expects to be discharged to:: Private residence Living Arrangements: Spouse/significant other Available Help at Discharge: Family, Available 24 hours/day Type of Home: House Home Access: Stairs to enter [employer] of Steps: 6 Entrance Stairs-Rails: Right, Left, Can reach both Home Layout: Multi-level, Able to live on main level with bedroom/bathroom Bathroom Shower/Tub: Health Visitor: Handicapped height  Lives With: Spouse Prior Function Level of Independence: Independent with basic ADLs, Independent with homemaking with ambulation, Independent with transfers, Independent with gait  Able to Take Stairs?: Yes Driving: Yes Vocation: Part time employment Vision Baseline Vision/History: 0 No visual deficits;1 Wears glasses (reading only) Ability to See in Adequate Light: 0 Adequate Patient Visual Report: No change from baseline Vision Assessment?: No apparent visual deficits Perception  Perception: Within Functional Limits Praxis Praxis: WFL Cognition Cognition Overall Cognitive Status: Within Functional Limits for tasks assessed Memory: Appears intact Awareness: Appears intact Problem Solving: Appears intact Safety/Judgment: Appears intact Brief Interview for Mental Status (BIMS) Repetition of Three Words (First Attempt): 3 Temporal Orientation: Year: Correct Temporal Orientation: Month: Accurate within 5 days Temporal Orientation: Day: Correct Recall: Sock: Yes, no cue required Recall: Blue: Yes, no cue required Recall: Bed: Yes, no cue required BIMS Summary Score: 15 Sensation Sensation Light Touch: Appears Intact Hot/Cold: Appears Intact Proprioception: Appears Intact Stereognosis: Appears Intact Coordination Gross Motor Movements are Fluid and Coordinated: Yes Fine Motor Movements are Fluid and Coordinated: Yes Motor     Trunk/Postural Assessment   Postural Control Postural Control: Within Functional Limits  Balance Dynamic Sitting Balance Dynamic Sitting - Level of Assistance: 7: Independent Dynamic Standing Balance Dynamic Standing - Level of Assistance: 5: Stand by assistance Extremity/Trunk Assessment RUE Assessment RUE Assessment: Within Functional Limits LUE Assessment LUE Assessment: Within Functional Limits  Care Tool Care Tool Self Care Eating   Eating Assist Level: Independent    Oral Care    Oral Care Assist Level: Independent    Bathing   Body parts bathed by patient: Right arm;Left arm;Abdomen;Chest;Front perineal area;Buttocks;Right upper leg;Left upper leg;Right lower leg;Left lower leg;Face     Assist Level: Set up assist    Upper Body Dressing(including orthotics)   What is the patient wearing?: Hospital gown only   Assist Level: Set up assist    Lower Body Dressing (excluding footwear)   What is the patient wearing?: Underwear/pull up;Pants Assist for lower body dressing: Set up assist    Putting on/Taking off footwear   What is the patient wearing?: Shoes;Ted hose Assist for footwear: Set up assist       Care Tool Toileting Toileting activity   Assist for toileting: Independent with assistive device     Care Tool Bed Mobility Roll left and right activity   Roll left and right assist level: Independent    Sit to lying activity   Sit to lying assist level: Independent with assistive device    Lying to sitting on side of bed activity   Lying to sitting on side of bed assist level: the ability to  move from lying on the back to sitting on the side of the bed with no back support.: Independent with assistive device     Care Tool Transfers Sit to stand transfer   Sit to stand assist level: Independent with assistive device    Chair/bed transfer   Chair/bed transfer assist level: Independent with assistive device     Toilet transfer   Assist Level: Independent with assistive device      Care Tool Cognition  Expression of Ideas and Wants Expression of Ideas and Wants: 4. Without difficulty (complex and basic) - expresses complex messages without difficulty and with speech that is clear and easy to understand  Understanding Verbal and Non-Verbal Content Understanding Verbal and Non-Verbal Content: 4. Understands (complex and basic) - clear comprehension without cues or repetitions   Memory/Recall Ability Memory/Recall Ability : Current season;Location of own room;Staff names and faces;That he or she is in a hospital/hospital unit   Refer to Care Plan for Long Term Goals  SHORT TERM GOAL WEEK 1 OT Short Term Goal 1 (Week 1): STGs = LTGs  Recommendations for other services: None    Skilled Therapeutic Intervention ADL ADL Eating: Independent Grooming: Independent Upper Body Bathing: Setup Where Assessed-Upper Body Bathing: Sitting at sink Lower Body Bathing: Setup Where Assessed-Lower Body Bathing: Sitting at sink;Standing at sink Upper Body Dressing: Setup Lower Body Dressing: Setup Toileting: Modified independent Where Assessed-Toileting: Bedside Commode Toilet Transfer Method: Stand pivot Toilet Transfer Equipment: Bedside commode    Discharge Criteria: Patient will be discharged from OT if patient refuses treatment 3 consecutive times without medical reason, if treatment goals not met, if there is a change in medical status, if patient makes no progress towards goals or if patient is discharged from hospital.  The above assessment, treatment plan, treatment alternatives and goals were discussed and mutually agreed upon: by patient  Latrell Reitan 03/08/2024, 12:22 PM

## 2024-03-08 NOTE — Progress Notes (Signed)
 PROGRESS NOTE   Subjective/Complaints: She reports she is feeling OK overall. Would like to ensure IR aware she is here regarding care of her drain. She says she is due for wound wound care, wet to dry dressings.  LBM today  ROS: Patient denies fever, new vision changes, dizziness, nausea, vomiting,   shortness of breath or chest pain, headache, or mood change. Loose Bms- improved  Objective:   No results found. Recent Labs    03/07/24 0543 03/08/24 1032  WBC 7.6 7.7  HGB 9.1* 10.3*  HCT 28.7* 33.0*  PLT 323 376   Recent Labs    03/07/24 0543 03/08/24 1032  NA 138 138  K 3.2* 4.0  CL 105 104  CO2 24 25  GLUCOSE 160* 195*  BUN 8 6*  CREATININE 0.73 0.72  CALCIUM 7.9* 8.4*    Intake/Output Summary (Last 24 hours) at 03/08/2024 1302 Last data filed at 03/08/2024 0610 Gross per 24 hour  Intake --  Output 15 ml  Net -15 ml        Physical Exam: Vital Signs Blood pressure (!) 172/82, pulse 76, temperature 98.4 F (36.9 C), temperature source Oral, resp. rate 19, height 5' 5.5 (1.664 m), weight 89.5 kg, SpO2 95%.  Constitutional: No apparent distress. Appropriate appearance for age.  Laying in bed HENT: No JVD. Neck Supple. Trachea midline. Atraumatic, normocephalic. Eyes: PERRLA. EOMI. Visual fields grossly intact.  Cardiovascular: RRR, 2+ b/l LE Edema.   Respiratory: CTAB. No rales, rhonchi, or wheezing. On RA.  Abdomen: + bowel sounds.  No gross tenderness to palpation.     Skin: C/D/I. No apparent lesions. + Midline abdominal incision -covered in dry ABD pad + Right flank/buttocks drain.  C-D-I, small amount of thick white and blood tinged fluid in tubing + Right PICC line, CDI + Left midline, CDI   MSK:      No apparent deformity.     Neurologic exam: Alert and oriented x3, follows commands CN: 2-12 grossly intact.         Strength:                RUE: 5/5 SA, 5/5 EF, 5/5 EE, 5/5 WE, 5/5  FF, 5/5 FA                  LUE: 5/5 SA, 5/5 EF, 5/5 EE, 5/5 WE, 5/5 FF, 5/5 FA                  RLE: 4/5 HF, 4/5 KE, 5/5 DF, 5/5 EHL, 5/5 PF                  LLE:  5/5 HF, 5/5 KE, 5/5 DF, 5/5 EHL, 5/5 PF    Assessment/Plan: 1. Functional deficits which require 3+ hours per day of interdisciplinary therapy in a comprehensive inpatient rehab setting. Physiatrist is providing close team supervision and 24 hour management of active medical problems listed below. Physiatrist and rehab team continue to assess barriers to discharge/monitor patient progress toward functional and medical goals  Care Tool:  Bathing    Body parts bathed by patient: Right arm, Left arm, Abdomen, Chest, Front perineal area, Buttocks,  Right upper leg, Left upper leg, Right lower leg, Left lower leg, Face         Bathing assist Assist Level: Set up assist     Upper Body Dressing/Undressing Upper body dressing   What is the patient wearing?: Hospital gown only    Upper body assist Assist Level: Set up assist    Lower Body Dressing/Undressing Lower body dressing      What is the patient wearing?: Underwear/pull up, Pants     Lower body assist Assist for lower body dressing: Set up assist     Toileting Toileting    Toileting assist Assist for toileting: Independent with assistive device     Transfers Chair/bed transfer  Transfers assist     Chair/bed transfer assist level: Independent with assistive device     Locomotion Ambulation   Ambulation assist      Assist level: Supervision/Verbal cueing Assistive device: Other (comment) (IV pole)     Walk 10 feet activity   Assist     Assist level: Supervision/Verbal cueing Assistive device: Other (comment) (IV pole)   Walk 50 feet activity   Assist    Assist level: Supervision/Verbal cueing Assistive device: Other (comment) (IV)    Walk 150 feet activity   Assist           Walk 10 feet on uneven surface   activity   Assist           Wheelchair     Assist               Wheelchair 50 feet with 2 turns activity    Assist            Wheelchair 150 feet activity     Assist          Blood pressure (!) 172/82, pulse 76, temperature 98.4 F (36.9 C), temperature source Oral, resp. rate 19, height 5' 5.5 (1.664 m), weight 89.5 kg, SpO2 95%.  Medical Problem List and Plan: 1. Functional deficits secondary to debility from small bowel obstruction status post ex lap 10-19 Dr. Vernetta              -patient may not shower             -ELOS/Goals: 10 to 12 days, supervision PT/OT             -Continue CIR   2.  Antithrombotics/Hx DVTs d/t Factor V Leiden: -DVT/anticoagulation:  Pharmaceutical: Lovenox 40 mg daily             -antiplatelet therapy: ASA 81 mg daily --resumed by hospitalist just prior to rehab admission             - Negative bilateral lower extremity duplex 10-21   3. Pain Management: Has not been using PRNs much.  Consolidate to Tylenol 1000 mg every 6 hours as needed and tramadol 50 to 100 mg every 6 hours as needed for moderate to severe pain.  Robaxin 500 mg as needed.   4. Mood/Behavior/Sleep: Xanax  0.25 nightly, buproprion 300 mg daily             -antipsychotic agents: None              - Trazodone PRN added   5. Neuropsych/cognition: This patient is capable of making decisions on her own behalf. 6. Skin/Wound Care:               - Midline abdominal incision, change dressings daily and as needed              -  advised patient to use pillows and try to sleep on her right side to avoid putting pressure on plastic piece adhered to her back.              - Destin ointment to buttocks 4 times daily to prevent MASD  -Wound care orders placed   7. Fluids/Electrolytes/Nutrition:  AM labs pending.  Periodic hypomagnesemia/hypokalemia.   8.  Moderate protein calorie malnutrition.              - Dietary consult              - Soft diet,  probiotics nightly   9.  Stubborn ileus/diarrhea.              - Patient endorses passing whole pills, Lasix  and K Dur.  Dietary order placed and nursing informed to crush pills when possible to assist in absorption.              - Psyllium twice daily              - Per surgery,  avoid Lomotil, imodium              - Monitor fluids closely, may need maintenance IV fluids   10.  Peripheral edema/anasarca             - On Lasix  20 mg daily; 2x weeks and as needed at home             - Potassium supplementation while on Lasix , 20 mill equivalents daily.   11.  Right pelvic abscess. JP Drain placed by IR 10-24             - Cultures pending              - Will need IR to follow-up on drain care, timeline for removal  -IR notified, seen today, appreciate assistance. Will need to contact IR APP or on call IR MD prior to patient d/c    12.  Postop abdominal wall infection.             - Continue Zosyn (started 10/20; will need to follow up with general surgery on duration)   13.  History of diabetes.  Hemoglobin A1c 5.8 on 12-2023.  Metformin  XR 1000 mg daily -- resumed by hospitalist just prior to inpatient rehab admission.              - Check blood sugars twice daily to start  -10/27 stable this am, monitor   CBG (last 3)  Recent Labs    03/08/24 0617  GLUCAP 139*      14.  Hypertension.  Switched from Toprol -XL to Coreg 25 mg twice daily.  Lasix  as above.    03/08/2024    5:08 AM 03/07/2024    7:35 PM 03/07/2024    4:43 PM  Vitals with BMI  Height   5' 5.5  Weight   197 lbs 5 oz  BMI   32.32  Systolic 172 151 832  Diastolic 82 78 73  Pulse 76 78 76  Start amlodipine 5mg  daily for BP control    15.  Allergies.  Has as needed Zyrtec, Flonase .       LOS: 1 days A FACE TO FACE EVALUATION WAS PERFORMED  Murray Collier 03/08/2024, 1:02 PM

## 2024-03-09 DIAGNOSIS — F418 Other specified anxiety disorders: Secondary | ICD-10-CM

## 2024-03-09 LAB — GLUCOSE, CAPILLARY
Glucose-Capillary: 118 mg/dL — ABNORMAL HIGH (ref 70–99)
Glucose-Capillary: 117 mg/dL — ABNORMAL HIGH (ref 70–99)

## 2024-03-09 NOTE — Progress Notes (Signed)
 Occupational Therapy Session Note  Patient Details  Name: Kristine [name] MRN: [id number] Date of Birth: Aug 18, 1955  Today's Date: 03/09/2024 OT Individual Time: 1425-1530 OT Individual Time Calculation (min): 65 min    Short Term Goals: Week 1:  OT Short Term Goal 1 (Week 1): STGs = LTGs  Skilled Therapeutic Interventions/Progress Updates:  Pt greeted sitting in recliner for skilled OT session with focus on functional mobility and higher level balance activities to decrease fall risk.   Pain: Pt with intermediate pain/discomfort in L-knee. OT offering intermediate rest breaks and positioning suggestions throughout session to address pain/fatigue and maximize participation/safety in session.   Functional Transfers: Ambulatory transfers with supervision + use of IV pole for unilateral support.   Self Care Tasks: No needs voiced this session.   Therapeutic Activities: Pt instructed in 3x30 sec cycles of step ups onto 3in step, CGA provided with no UE support. Session then transitioned to sideways and backwards walking, increased cuing provided to maintained B-feet pointed forward. CGA provided.   Pt then completes 2 x 1 min cycles of weighted ball toss onto ball trampoline for BUE strengthening/endurance.   Pt remained completing toileting with 4Ps assessed and immediate needs met. Pt continues to be appropriate for skilled OT intervention to promote further functional independence in ADLs/IADLs.   Therapy Documentation Precautions:  Precautions Precautions: Fall Recall of Precautions/Restrictions: Intact Precaution/Restrictions Comments: PICC; TPN, drains - no shower Restrictions Weight Bearing Restrictions Per Provider Order: No   Therapy/Group: Individual Therapy  Nereida Habermann, OTR/L, MSOT  03/09/2024, 7:55 AM

## 2024-03-09 NOTE — Progress Notes (Signed)
 PROGRESS NOTE   Subjective/Complaints: CT abdomen completed ordered by IR, fluid collection appears improved. Pt asked about use of imodium/lomotil. She has this ordered PRN.  She had several Bms yesterday. LBM today  ROS: Patient denies fever, new vision changes, dizziness, nausea, vomiting,   shortness of breath or chest pain, headache, or mood change. Loose Bms- intermittant  Objective:   CT ABDOMEN PELVIS W CONTRAST Result Date: 03/08/2024 EXAM: CT ABDOMEN AND PELVIS WITH CONTRAST 03/08/2024 07:53:01 PM TECHNIQUE: CT of the abdomen and pelvis was performed with the administration of 75 mL of iohexol (OMNIPAQUE) 350 MG/ML injection. Multiplanar reformatted images are provided for review. Automated exposure control, iterative reconstruction, and/or weight-based adjustment of the mA/kV was utilized to reduce the radiation dose to as low as reasonably achievable. COMPARISON: None available. CLINICAL HISTORY: Intra-abdominal abscess. FINDINGS: LOWER CHEST: No acute abnormality. LIVER: The liver is unremarkable. GALLBLADDER AND BILE DUCTS: Status post cholecystectomy. No biliary ductal dilatation. SPLEEN: No acute abnormality. PANCREAS: No acute abnormality. ADRENAL GLANDS: No acute abnormality. KIDNEYS, URETERS AND BLADDER: Punctate 1 - 2 mm nonobstructing calculus within the lower pole of the left kidney. No ureteral calculi. No hydronephrosis. No perinephric inflammatory stranding or fluid collections were identified. The bladder is unremarkable. GI AND BOWEL: The stomach, small bowel, and large bowel are otherwise unremarkable. Appendix normal. There is no bowel obstruction. PERITONEUM AND RETROPERITONEUM: Interval left transgluteal pigtail drainage catheter placement with near complete evacuation of the loculated fluid collection within the cul-de-sac. Stable 2.1 cm rim enhancing collection within the right adnexa. No new loculated  intraabdominal fluid collections. No free intraperitoneal gas or fluid. Stable mesenteric edema likely postsurgical in nature. VASCULATURE: Aorta is normal in caliber. LYMPH NODES: No lymphadenopathy. REPRODUCTIVE ORGANS: Stable 2.1 cm rim enhancing collection within the right adnexa. BONES AND SOFT TISSUES: Laparotomy incision again identified with superficial dehiscence in the region of the umbilicus and minimal gas fluid seen tracking into the left rectus sheath medially undermining the left abdominal wall subcutaneous fat. No discrete drainable fluid collection. No intraperitoneal extension. Osseous structures are age appropriate. No acute bone abnormality. No lytic or blastic bone lesion. IMPRESSION: 1. Interval left transgluteal pigtail drainage catheter placement with near-complete evacuation of the prior cul-de-sac fluid collection; no new loculated intra-abdominal collections. 2. Stable 2.1 cm rim-enhancing collection within the right adnexa. 3. Laparotomy incision with superficial dehiscence at the umbilicus and minimal gas/fluid tracking into the left rectus sheath with mild subcutaneous undermining; no discrete drainable collection and no intraperitoneal extension. 4. Punctate 12 mm nonobstructing left lower pole renal calculus. Electronically signed by: Dorethia Molt MD 03/08/2024 10:11 PM EDT RP Workstation: [id number]   Recent Labs    03/07/24 0543 03/08/24 1032  WBC 7.6 7.7  HGB 9.1* 10.3*  HCT 28.7* 33.0*  PLT 323 376   Recent Labs    03/07/24 0543 03/08/24 1032  NA 138 138  K 3.2* 4.0  CL 105 104  CO2 24 25  GLUCOSE 160* 195*  BUN 8 6*  CREATININE 0.73 0.72  CALCIUM 7.9* 8.4*    Intake/Output Summary (Last 24 hours) at 03/09/2024 1257 Last data filed at 03/09/2024 1256 Gross per 24 hour  Intake  1260 ml  Output 10 ml  Net 1250 ml        Physical Exam: Vital Signs Blood pressure (!) 142/64, pulse 77, temperature 98.5 F (36.9 C), resp. rate 18, height 5' 5.5  (1.664 m), weight 89.5 kg, SpO2 94%.  Constitutional: No apparent distress. Appropriate appearance for age.  Working with therapy in the gym HENT: No JVD. Neck Supple. Trachea midline. Atraumatic, normocephalic. Eyes: PERRLA. EOMI. Visual fields grossly intact.  Cardiovascular: RRR, 2+ b/l LE Edema.   Respiratory: CTAB. No rales, rhonchi, or wheezing. On RA.  Abdomen: + bowel sounds.  No gross tenderness to palpation.     Skin: C/D/I. No apparent lesions. + Midline abdominal incision -covered in dry ABD pad + Right flank/buttocks drain.  C-D-I, small amount fluid in tubing and drain + Right PICC line, CDI + Left midline, CDI   MSK:      No apparent deformity.     Neurologic exam: Alert and oriented x3, follows commands CN: 2-12 grossly intact.   Moving all 4 to gravity and resistance  Prior exam:      Strength:                RUE: 5/5 SA, 5/5 EF, 5/5 EE, 5/5 WE, 5/5 FF, 5/5 FA                  LUE: 5/5 SA, 5/5 EF, 5/5 EE, 5/5 WE, 5/5 FF, 5/5 FA                  RLE: 4/5 HF, 4/5 KE, 5/5 DF, 5/5 EHL, 5/5 PF                  LLE:  5/5 HF, 5/5 KE, 5/5 DF, 5/5 EHL, 5/5 PF    Assessment/Plan: 1. Functional deficits which require 3+ hours per day of interdisciplinary therapy in a comprehensive inpatient rehab setting. Physiatrist is providing close team supervision and 24 hour management of active medical problems listed below. Physiatrist and rehab team continue to assess barriers to discharge/monitor patient progress toward functional and medical goals  Care Tool:  Bathing    Body parts bathed by patient: Right arm, Left arm, Abdomen, Chest, Front perineal area, Buttocks, Right upper leg, Left upper leg, Right lower leg, Left lower leg, Face         Bathing assist Assist Level: Set up assist     Upper Body Dressing/Undressing Upper body dressing   What is the patient wearing?: Hospital gown only    Upper body assist Assist Level: Set up assist    Lower Body  Dressing/Undressing Lower body dressing      What is the patient wearing?: Underwear/pull up, Pants     Lower body assist Assist for lower body dressing: Set up assist     Toileting Toileting    Toileting assist Assist for toileting: Independent with assistive device     Transfers Chair/bed transfer  Transfers assist     Chair/bed transfer assist level: Independent with assistive device Chair/bed transfer assistive device: Armrests   Locomotion Ambulation   Ambulation assist      Assist level: Supervision/Verbal cueing Assistive device: No Device Max distance: 250   Walk 10 feet activity   Assist     Assist level: Supervision/Verbal cueing Assistive device: No Device   Walk 50 feet activity   Assist    Assist level: Supervision/Verbal cueing Assistive device: No Device    Walk 150 feet activity  Assist    Assist level: Supervision/Verbal cueing Assistive device: Other (comment) (pushing w/c)    Walk 10 feet on uneven surface  activity   Assist     Assist level: Supervision/Verbal cueing     Wheelchair     Assist Is the patient using a wheelchair?: No             Wheelchair 50 feet with 2 turns activity    Assist            Wheelchair 150 feet activity     Assist          Blood pressure (!) 142/64, pulse 77, temperature 98.5 F (36.9 C), resp. rate 18, height 5' 5.5 (1.664 m), weight 89.5 kg, SpO2 94%.  Medical Problem List and Plan: 1. Functional deficits secondary to debility from small bowel obstruction status post ex lap 10-19 Dr. Vernetta              -patient may not shower             -ELOS/Goals: 10 to 12 days, supervision PT/OT             -Continue CIR  -Team conference tomorrow, likely short length of stay   2.  Antithrombotics/Hx DVTs d/t Factor V Leiden: -DVT/anticoagulation:  Pharmaceutical: Lovenox 40 mg daily             -antiplatelet therapy: ASA 81 mg daily --resumed by  hospitalist just prior to rehab admission             - Negative bilateral lower extremity duplex 10-21   3. Pain Management: Has not been using PRNs much.  Consolidate to Tylenol 1000 mg every 6 hours as needed and tramadol 50 to 100 mg every 6 hours as needed for moderate to severe pain.  Robaxin 500 mg as needed.   4. Mood/Behavior/Sleep: Xanax  0.25 nightly, buproprion 300 mg daily             -antipsychotic agents: None              - Trazodone PRN added   5. Neuropsych/cognition: This patient is capable of making decisions on her own behalf. 6. Skin/Wound Care:               - Midline abdominal incision, change dressings daily and as needed              -  advised patient to use pillows and try to sleep on her right side to avoid putting pressure on plastic piece adhered to her back.              - Destin ointment to buttocks 4 times daily to prevent MASD  -Wound care orders placed   7. Fluids/Electrolytes/Nutrition:  AM labs pending.  Periodic hypomagnesemia/hypokalemia.   8.  Moderate protein calorie malnutrition.              - Dietary consult              - Soft diet, probiotics nightly   9.  Stubborn ileus/diarrhea.              - Patient endorses passing whole pills, Lasix  and K Dur.  Dietary order placed and nursing informed to crush pills when possible to assist in absorption.              - Psyllium twice daily              -  Per surgery,  avoid Lomotil, imodium- Looked in to this further, surgery was ok with lomotil trial on 10/19 and she was using this intermittently during acute hospitalization. I think its ok for her to continue this PRN              - Monitor fluids closely, may need maintenance IV fluids   10.  Peripheral edema/anasarca             - On Lasix  20 mg daily; 2x weeks and as needed at home             - Potassium supplementation while on Lasix , 20 mill equivalents daily.   11.  Right pelvic abscess. JP Drain placed by IR 10-24             - Cultures  pending              - Will need IR to follow-up on drain care, timeline for removal  -IR notified, seen today, appreciate assistance. Will need to contact IR APP or on call IR MD prior to patient d/c   -CT scan completed ordered by IR, fluid collection improved, will contact IR closer to DC as above   12.  Postop abdominal wall infection.             - Continue Zosyn (started 10/20; will need to follow up with general surgery on duration)  -Called surgery today   13.  History of diabetes.  Hemoglobin A1c 5.8 on 12-2023.  Metformin  XR 1000 mg daily -- resumed by hospitalist just prior to inpatient rehab admission.              - Check blood sugars twice daily to start  -10/27-28 stable this am, monitor   CBG (last 3)  Recent Labs    03/08/24 0617 03/08/24 1636 03/09/24 0655  GLUCAP 139* 142* 118*      14.  Hypertension.  Switched from Toprol -XL to Coreg 25 mg twice daily.  Lasix  as above.    03/08/2024    8:40 PM 03/08/2024    5:08 AM 03/07/2024    7:35 PM  Vitals with BMI  Systolic 142 172 848  Diastolic 64 82 78  Pulse 77 76 78  Start amlodipine 5mg  daily for BP control  10/28 monitor response to medication change    15.  Allergies.  Has as needed Zyrtec, Flonase .       LOS: 2 days A FACE TO FACE EVALUATION WAS PERFORMED  Murray Collier 03/09/2024, 12:57 PM

## 2024-03-09 NOTE — Consult Note (Signed)
 Neuropsychological Consultation Comprehensive Inpatient Rehab   Patient:   Kristine [name]   DOB:   05/18/1955  MR Number:  [id number]  Location:  [facility] [address] [place] [address] Dept: [phone] Loc: [phone]           Date of Service:   03/09/2024  Start Time:   1 PM End Time:   2 PM  Provider/Observer:  Norleen Asa, Psy.D.       Clinical Neuropsychologist       Billing Code/Service: [phone]   Reason for Consultation: Kristine [name] is a 68 year old female referred for neuropsychological consultation during her ongoing admission to the comprehensive inpatient rehabilitation unit (CIRU) to assess mood and coping following a complicated hospital course.  History of Present Illness: Admitted to the hospital on 02/19/2024 with abdominal pain and elevated creatinine. CT angiography revealed a high-grade mid-small bowel obstruction. Initial conservative management was unsuccessful. A diagnostic laparoscopy on 02/23/2024 was converted to an open laparotomy, which identified significant abdominal adhesions causing a distal small bowel stricture. Post-operative course was complicated by persistent ileus, high-volume diarrhea, and a wound infection with purulent drainage noted on 02/29/2024, treated with broad-spectrum antibiotics. An IR-placed drain was inserted into a pelvic fluid collection on 03/05/2024. Patient has since advanced to a soft diet and was admitted to the [facility] for functional deficits and weakness.  Past Medical History: Anxiety, depression, arthritis, basal cell carcinoma, cataracts, clotting disorder, DVT, esophageal erosions, Factor V Leiden, gastritis, GERD, colon polyps, hyperlipidemia, hypertension, radial head fracture (2011). Reports being pre-diabetic with a rising A1C.  Psychiatric History: History of anxiety and depression. Prescribed Wellbutrin , which was  abruptly stopped upon hospital admission and has since been restarted.  Patient also getting a low-dose alprazolam  at bedtime as well as trazodone to assist in onset of sleep.  The dosage of Wellbutrin  was recently increased prior to admission by her outpatient doctor for weight loss purposes. Reports one episode of tearfulness upon arrival to the [facility] but is otherwise coping. Expresses some anxiety about returning home and not being able to participate in seasonal activities, such as decorating for her favorite time of year.  Psychosocial History: Lives with husband. Has a daughter and a granddaughter. Denies current tobacco or illicit drug use.  Consultation Summary: Consultation was provided at the bedside. The patient is aware of her complex medical course and the associated setbacks. She expressed understanding that recovery will take time and was receptive to being on the rehabilitation unit to maximize functional gains before discharge. Mood appears appropriate to the situation. Acknowledges feeling overwhelmed at times but denies pervasive depressive or anxious symptoms that would interfere with her participation in therapies.  Psychoeducation was provided regarding the normal emotional responses to significant medical events and hospitalization. The importance of the right now focus during recovery was emphasized, advising against projecting current limitations onto the future. Explained the role of the rehabilitation team in getting her to a point where she can continue her recovery safely at home. Advised to notify the team if mood symptoms begin to interfere with her ability to engage in scheduled therapies. Encouraged to adhere to activity restrictions and recommendations provided by the therapy team. Patient was receptive and engaged throughout the consultation.  Recommendations: 1. Continue current medication regimen. No acute psychotropic medication changes are recommended at this  time. 2. Monitor for changes in mood or anxiety that may impede participation in rehabilitation. 3. Continue  with scheduled OT and PT to address functional deficits and weakness. 4. Team will establish a projected discharge date at the upcoming team conference to provide a clear goal. 5. Provide patient with ongoing support and encouragement. Will follow as needed.          Electronically Signed   _______________________ Norleen Asa, Psy.D. Clinical Neuropsychologist

## 2024-03-09 NOTE — Progress Notes (Signed)
 Patient ID: Kristine [name], female   DOB: [birth year], 68 y.o.   MRN: [id number]  Met with pt to see how doing, she reports she is working on her strength and hoping to see a MD to find out answers to the drain question and how long will be on IV antibiotics. Aware team conference tomorrow.

## 2024-03-09 NOTE — Progress Notes (Signed)
 Made on call provider aware of skin tear patient reported during PT session, patient made this nurse aware. Notified oncoming nurse.   Geni Armor, LPN

## 2024-03-09 NOTE — Progress Notes (Signed)
 Occupational Therapy Session Note  Patient Details  Name: CYNITHA BERTE MRN: [id number] Date of Birth: 07-Feb-1956  Today's Date: 03/09/2024 OT Individual Time: 9266-9184 OT Individual Time Calculation (min): 42 min    Short Term Goals: Week 1:  OT Short Term Goal 1 (Week 1): STGs = LTGs  Skilled Therapeutic Interventions/Progress Updates:     Pt received sitting up in bed with IV team RN present in room providing care. Pt presenting to be in good spirits receptive to skilled OT session reporting 0/10 pain- OT offering intermittent rest breaks, repositioning, and therapeutic support to optimize participation in therapy session. Mild headache reported with medications provided prior to OT session. Pt receptive to completing morning routine. UB dressing completed seated in bed with MIN A provided d/t IV line management. Pt weave feet into underwear/pants in long sitting and then stood from EOB to bring pants to waist SBA. She ambulated to sink while managing IV pole and tolerated standing for duration of grooming/hygiene tasks brushing hair and completing oral care with SBA. Pt then requesting to use restroom. Ambulatory transfer to bathroom no AD SBA. 3/3 toileting tasks completed with distant SUP without LOB noted following continent void and BM. Appropriate safety awareness during ADLs. Ambulated back to sink no AD and stood to wash hands. Pt was left resting in recliner with call bell in reach and all needs met.    Therapy Documentation Precautions:  Precautions Precautions: Fall Recall of Precautions/Restrictions: Intact Precaution/Restrictions Comments: PICC; TPN, drains - no shower Restrictions Weight Bearing Restrictions Per Provider Order: No   Therapy/Group: Individual Therapy  Katheryn SHAUNNA Mines 03/09/2024, 12:36 PM

## 2024-03-09 NOTE — Progress Notes (Signed)
 Occupational Therapy Session Note  Patient Details  Name: Kristine [name] MRN: [id number] Date of Birth: 1956/02/13  Today's Date: 03/09/2024 OT Individual Time: 9164-9052 OT Individual Time Calculation (min): 72 min    Short Term Goals: Week 1:  OT Short Term Goal 1 (Week 1): STGs = LTGs  Skilled Therapeutic Interventions/Progress Updates:   Patient agreeable to participate in OT session. Reports 0/10 pain level, however reported discomfort in shoulder blade with exercise due to prior condition. Patient received in room. Patient completed functional mobility to therapy gym to complete exercises. Patient able to complete light housekeeping activities such as picking up objects with a reacher to simulate at home tasks. Patient able to complete with sup A with no AE and good balance. Patient then completed UE strengthening exercises with dowel rod 3# for circuit in all directions. Patient able to then complete UBE Nustep for 2 bouts of 5 minutes due to increased fatigue on level 4 with a pace of 45-55 RPM. Patient then completed functional mobility approx 150 ft back to room to complete UE strengthening via Derrion Tritz theraband. Patient able to complete scapular retractions 2x15 on each side to increase UE strength and decrease painful UE condition. Patient then handed off to nursing for medication in room all needs in reach.  Therapy Documentation Precautions:  Precautions Precautions: Fall Recall of Precautions/Restrictions: Intact Precaution/Restrictions Comments: PICC; TPN, drains - no shower Restrictions Weight Bearing Restrictions Per Provider Order: No  Therapy/Group: Individual Therapy  D'mariea L Havyn Ramo 03/09/2024, 7:22 AM

## 2024-03-09 NOTE — Progress Notes (Signed)
 Physical Therapy Session Note  Patient Details  Name: SHANEISHA BURKEL MRN: [id number] Date of Birth: 1956/03/13  Today's Date: 03/09/2024 PT Individual Time: 1115-1202 PT Individual Time Calculation (min): 47 min   Short Term Goals: Week 1:  PT Short Term Goal 1 (Week 1): = LTGS  Skilled Therapeutic Interventions/Progress Updates:      Pt seated in recliner upon arrival. Pt reports headache but states she has been medicated for it. Pt agreeable to therapy.  Session emphasized activity tolerance and functional strengthening and endurance. Pt requested to use bathroom. Pt ambulated to toilet pushing IV pole with mod I. Pt performed all toileting tasks and hand hygiene with mod I. Pt ambulated ~130 ft pushing IV pole from room to Nustep in ortho gym with supervision/mod I. Utlized Nustep on level 3-4 x 10 min for BLE/BUE strengthening and cardiovascular endurance training. Pt ambulated back to room, same distance and assist as before. Pt instructed in and performed standing the following B LE strengthening exercises standing at sink for UE support: heel raises 2x10 and hip abduction 2x10 with standing rest breaks between sets due to fatigue. PT provided patient with illustrated handout for HEP. Pt performed another short ambulatory transfer to toilet with mod I. Pt seated in recliner with all needs within reach.  Therapy Documentation Precautions:  Precautions Precautions: Fall Recall of Precautions/Restrictions: Intact Precaution/Restrictions Comments: PICC; TPN, drains - no shower Restrictions Weight Bearing Restrictions Per Provider Order: No  Therapy/Group: Individual Therapy  [name], DPT 03/09/2024, 7:48 AM

## 2024-03-09 NOTE — Plan of Care (Signed)
  Problem: Consults Goal: RH GENERAL PATIENT EDUCATION Description: See Patient Education module for education specifics. Outcome: Progressing Goal: Nutrition Consult-if indicated Outcome: Progressing   Problem: RH BOWEL ELIMINATION Goal: RH STG MANAGE BOWEL WITH ASSISTANCE Description: STG Manage Bowel with mod I Assistance. Outcome: Progressing   Problem: RH SKIN INTEGRITY Goal: RH STG SKIN FREE OF INFECTION/BREAKDOWN Description: Manage w min assist Outcome: Progressing Goal: RH STG ABLE TO PERFORM INCISION/WOUND CARE W/ASSISTANCE Description: STG Able To Perform Incision/Wound Care With min Assistance. Outcome: Progressing   Problem: RH SAFETY Goal: RH STG ADHERE TO SAFETY PRECAUTIONS W/ASSISTANCE/DEVICE Description: STG Adhere to Safety Precautions With cues Assistance/Device. Outcome: Progressing   Problem: RH PAIN MANAGEMENT Goal: RH STG PAIN MANAGED AT OR BELOW PT'S PAIN GOAL Description: Pain < 4 with prns Outcome: Progressing   Problem: RH KNOWLEDGE DEFICIT GENERAL Goal: RH STG INCREASE KNOWLEDGE OF SELF CARE AFTER HOSPITALIZATION Description: Patient and spouse will be able to manage care at discharge using educational resources independently Outcome: Progressing

## 2024-03-09 NOTE — Plan of Care (Signed)
  Problem: Consults Goal: RH GENERAL PATIENT EDUCATION Description: See Patient Education module for education specifics. Outcome: Progressing Goal: Nutrition Consult-if indicated Outcome: Progressing   Problem: RH BOWEL ELIMINATION Goal: RH STG MANAGE BOWEL WITH ASSISTANCE Description: STG Manage Bowel with mod I Assistance. Outcome: Progressing   Problem: RH SKIN INTEGRITY Goal: RH STG SKIN FREE OF INFECTION/BREAKDOWN Description: Manage w min assist Outcome: Progressing Goal: RH STG ABLE TO PERFORM INCISION/WOUND CARE W/ASSISTANCE Description: STG Able To Perform Incision/Wound Care With min Assistance. Outcome: Progressing   Problem: RH SAFETY Goal: RH STG ADHERE TO SAFETY PRECAUTIONS W/ASSISTANCE/DEVICE Description: STG Adhere to Safety Precautions With cues Assistance/Device. Outcome: Progressing

## 2024-03-09 NOTE — Discharge Instructions (Signed)
 Inpatient Rehab Discharge Instructions  Kristine [name] Discharge date and time: No discharge date for patient encounter.   Activities/Precautions/ Functional Status: Activity: activity as tolerated Diet: Soft Wound Care: Routine skin checks Functional status:  ___ No restrictions     ___ Walk up steps independently ___ 24/7 supervision/assistance   ___ Walk up steps with assistance ___ Intermittent supervision/assistance  ___ Bathe/dress independently ___ Walk with walker     _x__ Bathe/dress with assistance ___ Walk Independently    ___ Shower independently ___ Walk with assistance    ___ Shower with assistance ___ No alcohol     ___ Return to work/school ________  Special Instructions: No driving smoking or alcohol   My questions have been answered and I understand these instructions. I will adhere to these goals and the provided educational materials after my discharge from the hospital.  Patient/Caregiver Signature _______________________________ Date __________  Clinician Signature _______________________________________ Date __________  Please bring this form and your medication list with you to all your follow-up doctor's appointments.

## 2024-03-10 ENCOUNTER — Other Ambulatory Visit (HOSPITAL_COMMUNITY): Payer: Self-pay | Admitting: Radiology

## 2024-03-10 ENCOUNTER — Inpatient Hospital Stay (HOSPITAL_COMMUNITY)

## 2024-03-10 DIAGNOSIS — Z9889 Other specified postprocedural states: Secondary | ICD-10-CM

## 2024-03-10 DIAGNOSIS — N739 Female pelvic inflammatory disease, unspecified: Secondary | ICD-10-CM

## 2024-03-10 DIAGNOSIS — K9189 Other postprocedural complications and disorders of digestive system: Secondary | ICD-10-CM | POA: Insufficient documentation

## 2024-03-10 DIAGNOSIS — R600 Localized edema: Secondary | ICD-10-CM | POA: Insufficient documentation

## 2024-03-10 HISTORY — PX: IR CV LINE INJECTION: IMG2294

## 2024-03-10 LAB — AEROBIC/ANAEROBIC CULTURE W GRAM STAIN (SURGICAL/DEEP WOUND): Culture: NO GROWTH

## 2024-03-10 LAB — GLUCOSE, CAPILLARY
Glucose-Capillary: 131 mg/dL — ABNORMAL HIGH (ref 70–99)
Glucose-Capillary: 109 mg/dL — ABNORMAL HIGH (ref 70–99)

## 2024-03-10 MED ORDER — RISAQUAD PO CAPS
1.0000 | ORAL_CAPSULE | Freq: Every day | ORAL | Status: DC
  Administered 2024-03-11: 1 via ORAL
  Filled 2024-03-10: qty 1

## 2024-03-10 MED ORDER — PANTOPRAZOLE SODIUM 40 MG PO TBEC
40.0000 mg | DELAYED_RELEASE_TABLET | Freq: Every day | ORAL | Status: DC
  Administered 2024-03-11 – 2024-03-12 (×2): 40 mg via ORAL
  Filled 2024-03-10 (×2): qty 1

## 2024-03-10 MED ORDER — AMLODIPINE BESYLATE 5 MG PO TABS: 5.0000 mg | ORAL_TABLET | Freq: Every day | ORAL | Status: DC

## 2024-03-10 MED ORDER — IOHEXOL 300 MG/ML  SOLN
50.0000 mL | Freq: Once | INTRAMUSCULAR | Status: AC | PRN
  Administered 2024-03-10: 10 mL

## 2024-03-10 MED ORDER — AMLODIPINE BESYLATE 5 MG PO TABS
5.0000 mg | ORAL_TABLET | Freq: Every day | ORAL | Status: DC
  Filled 2024-03-10: qty 1

## 2024-03-10 MED ORDER — VITAMIN D 25 MCG (1000 UNIT) PO TABS
5000.0000 [IU] | ORAL_TABLET | Freq: Every day | ORAL | Status: DC
  Administered 2024-03-11: 5000 [IU] via ORAL
  Filled 2024-03-10: qty 5

## 2024-03-10 NOTE — Discharge Summary (Signed)
 Physician Discharge Summary  Patient ID: Kristine [name] MRN: [id number] DOB/AGE: 1955-06-04 68 y.o.  Admit date: 03/07/2024 Discharge date: 03/12/2024  Discharge Diagnoses:  Principal Problem:   Debility Active Problems:   HYPERCHOLESTEROLEMIA, PURE   Depression with anxiety   GASTROESOPHAGEAL REFLUX DISEASE   Obesity   Prediabetes   Essential hypertension   SBO (small bowel obstruction) (HCC)   Moderate protein-calorie malnutrition   Pelvic abscess in female   H/O drainage of abscess   Mild peripheral edema   Ileus, postoperative (HCC)   Discharged Condition: stable  Significant Diagnostic Studies: IR INJECT INDWELLING DRAINAGE CATHETER Result Date: 03/10/2024 INDICATION: Pelvic abscess EXAM: Fluoroscopic injection of the left trans gluteal pelvic abscess drain MEDICATIONS: The patient is currently admitted to the hospital and receiving intravenous antibiotics. The antibiotics were administered within an appropriate time frame prior to the initiation of the procedure. ANESTHESIA/SEDATION: Total intra-service moderate Sedation Time: None. The patient's level of consciousness and vital signs were monitored continuously by radiology nursing throughout the procedure under my direct supervision. COMPLICATIONS: None immediate. PROCEDURE: Informed written consent was obtained from the patient after a thorough discussion of the procedural risks, benefits and alternatives. All questions were addressed. Maximal Sterile Barrier Technique was utilized including caps, mask, sterile gowns, sterile gloves, sterile drape, hand hygiene and skin antiseptic. A timeout was performed prior to the initiation of the procedure. Previous imaging reviewed. Drain injection performed under fluoroscopy. Images obtained. Stable drain catheter position. There is a smaller collapsed but residual abscess cavity in the cul-de-sac correlating with the recent CT 2 days ago. No visualized fistula. Drain was flushed  and reconnected to external suction bulb. Mildly exudative fluid within the drain bulb. IMPRESSION: 1. Smaller residual abscess cavity in the cul-de-sac. 2. No visualized fistula. 3. Drain flushed and reconnected to external suction bulb. PLAN: Recommend continued daily flushing and external suction bulb. Patient can be scheduled for outpatient drain follow-up in the IR clinic in 10-14 days. Electronically Signed   By: CHRISTELLA.  Shick M.D.   On: 03/10/2024 14:59   CT ABDOMEN PELVIS W CONTRAST Result Date: 03/08/2024 EXAM: CT ABDOMEN AND PELVIS WITH CONTRAST 03/08/2024 07:53:01 PM TECHNIQUE: CT of the abdomen and pelvis was performed with the administration of 75 mL of iohexol (OMNIPAQUE) 350 MG/ML injection. Multiplanar reformatted images are provided for review. Automated exposure control, iterative reconstruction, and/or weight-based adjustment of the mA/kV was utilized to reduce the radiation dose to as low as reasonably achievable. COMPARISON: None available. CLINICAL HISTORY: Intra-abdominal abscess. FINDINGS: LOWER CHEST: No acute abnormality. LIVER: The liver is unremarkable. GALLBLADDER AND BILE DUCTS: Status post cholecystectomy. No biliary ductal dilatation. SPLEEN: No acute abnormality. PANCREAS: No acute abnormality. ADRENAL GLANDS: No acute abnormality. KIDNEYS, URETERS AND BLADDER: Punctate 1 - 2 mm nonobstructing calculus within the lower pole of the left kidney. No ureteral calculi. No hydronephrosis. No perinephric inflammatory stranding or fluid collections were identified. The bladder is unremarkable. GI AND BOWEL: The stomach, small bowel, and large bowel are otherwise unremarkable. Appendix normal. There is no bowel obstruction. PERITONEUM AND RETROPERITONEUM: Interval left transgluteal pigtail drainage catheter placement with near complete evacuation of the loculated fluid collection within the cul-de-sac. Stable 2.1 cm rim enhancing collection within the right adnexa. No new loculated  intraabdominal fluid collections. No free intraperitoneal gas or fluid. Stable mesenteric edema likely postsurgical in nature. VASCULATURE: Aorta is normal in caliber. LYMPH NODES: No lymphadenopathy. REPRODUCTIVE ORGANS: Stable 2.1 cm rim enhancing collection within the right adnexa. BONES AND SOFT TISSUES:  Laparotomy incision again identified with superficial dehiscence in the region of the umbilicus and minimal gas fluid seen tracking into the left rectus sheath medially undermining the left abdominal wall subcutaneous fat. No discrete drainable fluid collection. No intraperitoneal extension. Osseous structures are age appropriate. No acute bone abnormality. No lytic or blastic bone lesion. IMPRESSION: 1. Interval left transgluteal pigtail drainage catheter placement with near-complete evacuation of the prior cul-de-sac fluid collection; no new loculated intra-abdominal collections. 2. Stable 2.1 cm rim-enhancing collection within the right adnexa. 3. Laparotomy incision with superficial dehiscence at the umbilicus and minimal gas/fluid tracking into the left rectus sheath with mild subcutaneous undermining; no discrete drainable collection and no intraperitoneal extension. 4. Punctate 12 mm nonobstructing left lower pole renal calculus. Electronically signed by: Dorethia Molt MD 03/08/2024 10:11 PM EDT RP Workstation: [id number]   CT GUIDED PERITONEAL/RETROPERITONEAL FLUID DRAIN BY PERC CATH Result Date: 03/05/2024 INDICATION: Pelvic abscess EXAM: CT-GUIDED DRAINAGE OF THE POSTERIOR PELVIC ABSCESS MEDICATIONS: The patient is currently admitted to the hospital and receiving intravenous antibiotics. The antibiotics were administered within an appropriate time frame prior to the initiation of the procedure. ANESTHESIA/SEDATION: Moderate (conscious) sedation was employed during this procedure. A total of Versed 2.5 mg and Fentanyl 150 mcg was administered intravenously by the radiology nurse. Total  intra-service moderate Sedation Time: 13 minutes. The patient's level of consciousness and vital signs were monitored continuously by radiology nursing throughout the procedure under my direct supervision. COMPLICATIONS: None immediate. PROCEDURE: Informed written consent was obtained from the patient after a thorough discussion of the procedural risks, benefits and alternatives. All questions were addressed. Maximal Sterile Barrier Technique was utilized including caps, mask, sterile gowns, sterile gloves, sterile drape, hand hygiene and skin antiseptic. A timeout was performed prior to the initiation of the procedure. previous imaging reviewed. patient positioned prone. noncontrast localization CT performed. the posterior pelvic abscess was localized and marked for a left transgluteal approach. Under sterile conditions and local anesthesia, the 18 gauge 15 cm access needle was advanced from a left transgluteal medial approach into the fluid collection. Needle position confirmed with CT. Syringe aspiration yielded purulent fluid. Sample sent for culture. Guidewire inserted followed by tract dilatation to insert a 10 French drain. Drain catheter position confirmed with CT. Syringe aspiration yielded a total of 30 cc purulent fluid. Catheter was secured with a silk suture and a sterile dressing. External suction bulb applied followed by sterile dressing. No immediate complication. Patient tolerated the procedure well. IMPRESSION: Successful CT guided pelvic abscess drain placement as above. Electronically Signed   By: CHRISTELLA.  Shick M.D.   On: 03/05/2024 15:11   CT ABDOMEN PELVIS W CONTRAST Result Date: 03/04/2024 EXAM: CT ABDOMEN AND PELVIS WITH CONTRAST 03/04/2024 12:23:00 PM TECHNIQUE: CT of the abdomen and pelvis was performed with the administration of 75 mL of iohexol (OMNIPAQUE) 350 MG/ML injection. Multiplanar reformatted images are provided for review. Automated exposure control, iterative reconstruction,  and/or weight-based adjustment of the mA/kV was utilized to reduce the radiation dose to as low as reasonably achievable. COMPARISON: 02/29/2024 CLINICAL HISTORY: Intra-abdominal abscess. Abdominal Pain. FINDINGS: LOWER CHEST: No acute abnormality. LIVER: The liver is unremarkable. GALLBLADDER AND BILE DUCTS: Cholecystectomy. No biliary ductal dilatation. SPLEEN: No acute abnormality. PANCREAS: No acute abnormality. ADRENAL GLANDS: No acute abnormality. KIDNEYS, URETERS AND BLADDER: 2 mm left kidney lower pole nonobstructive renal calculus. No stones in the ureters. No hydronephrosis. No perinephric or periureteral stranding. Urinary bladder is unremarkable. GI AND BOWEL: Stomach demonstrates no acute abnormality. Scattered  small bowel wall thickening and mesenteric edema. There are dilated loops of small bowel up to 3.6 cm in diameter with transition to nondilated small bowel in the left abdomen at the site of a small bowel angulation. Distal small bowel loops are decompressed. There is no bowel obstruction. PERITONEUM AND RETROPERITONEUM: Small amount of scattered free intraperitoneal gas, reduced from prior. There is a pelvic fluid collection with some higher density components along the anterior margin of the collection. The collection is 11.9 x 6.2 x 3.6 cm (volume = 140 cm). The collection is roughly similar to prior. VASCULATURE: Aorta is normal in caliber. LYMPH NODES: No lymphadenopathy. REPRODUCTIVE ORGANS: No acute abnormality. BONES AND SOFT TISSUES: Lower lumbar spondylosis and degenerative disc disease. Subcutaneous gas and edema along the left upper thigh, expected given the recent incision. There is a 3.8 x 2.2 x 3.2 cm (volume = 14 cm) collection along the left anterior abdominal wall at the midline incision site, previously 4.0 x 2.4 x 3.5 cm (volume = 18 cm). The collection could represent abscess or hematoma given the recent surgery. IMPRESSION: 1. Small bowel obstruction with transition in  the left abdomen at a site of small bowel angulation. 2. Pelvic fluid collection, roughly similar to prior, measuring 11.9 x 6.2 x 3.6 cm (volume = 140 cm). 3. Left anterior abdominal wall collection at the midline incision site, possibly representing abscess or hematoma, measuring 3.8 x 2.2 x 3.2 cm (volume = 14 cm), previously 4.0 x 2.4 x 3.5 cm (volume = 18 cm). Electronically signed by: Lonni Necessary MD 03/04/2024 04:37 PM EDT RP Workstation: [id number]   VAS US  LOWER EXTREMITY VENOUS (DVT) Result Date: 03/02/2024  Lower Venous DVT Study Patient Name:  DAN DISSINGER Martin  Date of Exam:   03/02/2024 Medical Rec #: [id number]             Accession #:    [id number] Date of Birth: 1955/08/06             Patient Gender: F Patient Age:   25 years Exam Location:  [facility] Procedure:      VAS US  LOWER EXTREMITY VENOUS (DVT) Referring Phys: CHAPMAN ROTA --------------------------------------------------------------------------------  Indications: Swelling.  Risk Factors: None identified. Limitations: Body habitus and poor ultrasound/tissue interface. Comparison Study: No prior studies. Performing Technologist: Cordella Collet RVT  Examination Guidelines: A complete evaluation includes B-mode imaging, spectral Doppler, color Doppler, and power Doppler as needed of all accessible portions of each vessel. Bilateral testing is considered an integral part of a complete examination. Limited examinations for reoccurring indications may be performed as noted. The reflux portion of the exam is performed with the patient in reverse Trendelenburg.  +---------+---------------+---------+-----------+----------+--------------+ RIGHT    CompressibilityPhasicitySpontaneityPropertiesThrombus Aging +---------+---------------+---------+-----------+----------+--------------+ CFV      Full           Yes      Yes                                  +---------+---------------+---------+-----------+----------+--------------+ SFJ      Full                                                        +---------+---------------+---------+-----------+----------+--------------+ FV Prox  Full                                                        +---------+---------------+---------+-----------+----------+--------------+  FV Mid   Full                                                        +---------+---------------+---------+-----------+----------+--------------+ FV DistalFull                                                        +---------+---------------+---------+-----------+----------+--------------+ PFV      Full                                                        +---------+---------------+---------+-----------+----------+--------------+ POP      Full           Yes      Yes                                 +---------+---------------+---------+-----------+----------+--------------+ PTV      Full                                                        +---------+---------------+---------+-----------+----------+--------------+ PERO     Full                                                        +---------+---------------+---------+-----------+----------+--------------+   +---------+---------------+---------+-----------+----------+--------------+ LEFT     CompressibilityPhasicitySpontaneityPropertiesThrombus Aging +---------+---------------+---------+-----------+----------+--------------+ CFV      Full           Yes      Yes                                 +---------+---------------+---------+-----------+----------+--------------+ SFJ      Full                                                        +---------+---------------+---------+-----------+----------+--------------+ FV Prox  Full                                                         +---------+---------------+---------+-----------+----------+--------------+ FV Mid                  Yes      Yes                                 +---------+---------------+---------+-----------+----------+--------------+  FV Distal               Yes      Yes                                 +---------+---------------+---------+-----------+----------+--------------+ PFV      Full                                                        +---------+---------------+---------+-----------+----------+--------------+ POP      Full           Yes      Yes                                 +---------+---------------+---------+-----------+----------+--------------+ PTV      Full                                                        +---------+---------------+---------+-----------+----------+--------------+ PERO     Full                                                        +---------+---------------+---------+-----------+----------+--------------+     Summary: RIGHT: - There is no evidence of deep vein thrombosis in the lower extremity. However, portions of this examination were limited- see technologist comments above.  - No cystic structure found in the popliteal fossa.  LEFT: - There is no evidence of deep vein thrombosis in the lower extremity. However, portions of this examination were limited- see technologist comments above.  - No cystic structure found in the popliteal fossa.  *See table(s) above for measurements and observations. Electronically signed by Debby Robertson on 03/02/2024 at 2:25:20 PM.    Final    CT ABDOMEN PELVIS W CONTRAST Result Date: 03/01/2024 EXAM: CT ABDOMEN AND PELVIS WITH CONTRAST 02/29/2024 10:41:00 PM TECHNIQUE: CT of the abdomen and pelvis was performed with the administration of 75 mL of iohexol (OMNIPAQUE) 350 MG/ML injection. Multiplanar reformatted images are provided for review. Automated exposure control, iterative reconstruction, and/or  weight-based adjustment of the mA/kV was utilized to reduce the radiation dose to as low as reasonably achievable. COMPARISON: CTA chest, abdomen and pelvis 02/20/2024, CT of abdomen and pelvis with contrast 08/31/2003. CLINICAL HISTORY: Intra-abdominal abscess. Chief complaints: Abdominal pain. Exploratory laparotomy on 02/23/2024. FINDINGS: LOWER CHEST: Bilateral symmetric minimal layering pleural effusions. Mild subsegmental atelectasis posterior lung bases. Lung bases are otherwise clear. The cardiac size is normal. No pneumothorax. LIVER: The liver is unremarkable. GALLBLADDER AND BILE DUCTS: The gallbladder is absent. No biliary ductal dilatation. SPLEEN: No acute abnormality. PANCREAS: Partial pancreatic atrophy. No mass or ductal dilatation. ADRENAL GLANDS: No acute abnormality. KIDNEYS, URETERS AND BLADDER: No stones in the kidneys or ureters. No hydronephrosis. No renal mass enhancement. No perinephric or periureteral stranding. Urinary bladder is unremarkable. GI AND BOWEL: The gastric wall and duodenum are unremarkable. Beginning at  the ligament of Treitz, there is small bowel dilatation throughout up to 4 cm, except for normal caliber right lower quadrant segments. There are thickened and angulated segments in the mid to lower abdomen, any one of which could be transitional. The small bowel dilatation could be obstructive or due to ileus or enteritis . The appendix is normal. The wall of the colon is unremarkable. PERITONEUM AND RETROPERITONEUM: There is scattered free intraperitoneal air anteriorly in the pelvis to the left and in the midline. This is probably related to the recent surgery. Correlate clinically to exclude perforated bowel. There is slight enhancement and thickening of the peritoneal reflections along with patchy mesenteric edema in the lower abdomen and pelvis, which also could be postsurgical or inflammatory. There is a thin-walled pelvic fluid collection in between the uterus and  bladder (series 4, axial 78) measuring 8.9 x 7.3 cm and 15 Hounsfield units. . The collection appears uncomplicated. This is most likely a seroma. Early presentation of abscess is possible. There is a small amount of ascites in the perihepatic space and scattered interloop ascites in the mid and lower abdomen. VASCULATURE: Aorta is normal in caliber. LYMPH NODES: No lymphadenopathy. REPRODUCTIVE ORGANS: No acute abnormality. BONES AND SOFT TISSUES: New midline laparotomy skin staples. Small subincisional fluid collection at the superior aspect measuring 3.6 x 2.7 cm and 18 Hounsfield units, probable seroma. Just below the fluid collection, there are a few tiny subcutaneous air pockets, nonspecific and could be related to the recent closure or infectious. There is diffuse increased body wall anasarca, probably from third spacing and fluid overload. There is chronic nonerosive bilateral sacroiliitis. Degenerative change lower lumbar spine with acquired spinal stenosis of L3-L4. No acute or other significant osseous findings. IMPRESSION: 1. Small bowel dilatation up to 4 cm with thickened and angulated segments in the mid to lower abdomen that may represent transitional segments; findings may indicate small-bowel obstruction or postoperative ileus / enteritis .The small bowel is normal in caliber in the right lower quadrant. 2. Scattered free intraperitoneal air, favored postsurgical; recommend close clinical follow up and, if indicated, imaging follow-up to exclude bowel perforation. 3. Thin-walled pelvic fluid collection (8.9 x 7.3 cm, 15 HU) between the uterus and bladder, most consistent with a postoperative seroma; early abscess not excluded and short-interval follow-up is recommended. 4. Patchy mesenteric edema along with slightly enhancing peritoneal reflections, which could be postoperative etiology or inflammatory. 5. Diffuse increased body wall anasarca, likely from third spacing and fluid overload. 6.  Subincisional air and fluid as described above. 7. Small amount of ascites. Electronically signed by: Francis Quam MD 03/01/2024 12:02 AM EDT RP Workstation: [id number]   DG Abd 1 View Result Date: 02/29/2024 CLINICAL DATA:  Abdominal pain. EXAM: ABDOMEN - 1 VIEW COMPARISON:  02/23/2024 FINDINGS: Interval evolution of gaseous small bowel distension with decreased distension in the left and mid abdomen but some gas distended small bowel in the right abdomen appears progressive, measuring 4.7 cm diameter today. NG tube is been removed in the interval. Mild gaseous distention of stomach evident. Probable gas in the cecum/ascending colon without dilatation. Gas is visible in the rectum. IMPRESSION: Interval evolution of gaseous small bowel distension with decreased distension in the left and mid abdomen but some gas distended small bowel in the right abdomen appears progressive. Imaging features may reflect evolving ileus or small bowel obstruction. Electronically Signed   By: Camellia Candle M.D.   On: 02/29/2024 06:36   US  EKG SITE RITE Result Date: 02/27/2024  If Mgm Mirage not attached, placement could not be confirmed due to current cardiac rhythm.  DG Abd Portable 1V Result Date: 02/23/2024 CLINICAL DATA:  Small bowel obstruction EXAM: PORTABLE ABDOMEN - 1 VIEW COMPARISON:  Prior chest x-ray yesterday 02/22/2024 FINDINGS: Persistent gaseous distension of multiple loops of small bowel in the left mid abdomen with a maximal diameter of 4.4 cm. Findings are centrally unchanged. Gastric tube remains in good position with the tip overlying the fundus. No large free air. Multilevel degenerative disc disease. IMPRESSION: Persistent and unchanged small bowel obstruction. Maximal small bowel diameter of 4.4 cm. Gastric tube overlies the stomach. Electronically Signed   By: Wilkie Lent M.D.   On: 02/23/2024 09:07   DG Abd Portable 1V Result Date: 02/22/2024 CLINICAL DATA:  Small-bowel obstruction  EXAM: PORTABLE ABDOMEN - 1 VIEW COMPARISON:  Abdominal radiograph dated 02/21/2024 FINDINGS: Gastric/enteric tube tip projects over the stomach. Slightly decreased but persistent gas-filled dilated small bowel loops within the central abdomen. Dilute contrast material is seen within left hemi abdominal bowel loops. IMPRESSION: Slightly decreased but persistent gas-filled dilated small bowel loops within the central abdomen. Electronically Signed   By: Limin  Xu M.D.   On: 02/22/2024 10:21   DG Abd Portable 1V Result Date: 02/21/2024 EXAM: 1 VIEW XRAY OF THE ABDOMEN 02/21/2024 09:30:00 AM COMPARISON: 02/20/2024 CLINICAL HISTORY: SBO (small bowel obstruction) (HCC) [id number]. Reason for exam: SBO. FINDINGS: LINES, TUBES AND DEVICES: Gastric decompression tube in place in the gastric fundus. New enteric contrast material within the gastric fundus. BOWEL: Dilation of small bowel loops within the central abdomen, up to 4.5 cm in diameter compared to 3.3 cm previously, consistent with small bowel obstruction. SOFT TISSUES: Right upper quadrant surgical clips noted. No opaque urinary calculi. BONES: No acute osseous abnormality. IMPRESSION: 1. Small bowel obstruction with progressive dilation of small bowel loops up to 4.5 cm, increased from 3.3 cm previously. 2. Gastric decompression tube terminates in the gastric fundus. Electronically signed by: Waddell Calk MD 02/21/2024 10:05 AM EDT RP Workstation: [id number]   DG Abd Portable 1V-Small Bowel Obstruction Protocol-initial, 8 hr delay Result Date: 02/20/2024 CLINICAL DATA:  8 hour small-bowel follow up EXAM: PORTABLE ABDOMEN - 1 VIEW COMPARISON:  Film from earlier in the same day. FINDINGS: Gastric catheter is again noted in the stomach. Some retained contrast is noted in the gastric fundus. Administered contrast lies within the mildly dilated small bowel loops centrally. No significant colonic contrast is seen to suggest partial small bowel obstruction. 24 hour  follow-up is recommended. IMPRESSION: Contrast is noted within dilated loops of small bowel centrally. No significant colonic contrast is seen. 24 hour follow-up film is recommended. Electronically Signed   By: Oneil Devonshire M.D.   On: 02/20/2024 19:00   DG Abd 1 View Result Date: 02/20/2024 CLINICAL DATA:  Nasogastric tube placement. EXAM: DG ABDOMEN 1V COMPARISON:  Chest, abdomen and pelvis CTA dated 02/20/2024 FINDINGS: Nasogastric tube tip and side hole in the proximal stomach. Normal bowel-gas pattern. Excreted contrast in the renal collecting systems. Lower lumbar spine degenerative changes. Cholecystectomy clips. IMPRESSION: Nasogastric tube tip and side hole in the proximal stomach. Electronically Signed   By: Elspeth Bathe M.D.   On: 02/20/2024 09:45   DG Chest Port 1 View Result Date: 02/20/2024 EXAM: 1 VIEW(S) XRAY OF THE CHEST 02/20/2024 06:01:34 AM COMPARISON: 06/05/06. CLINICAL HISTORY: abdominal pain, hypoxia. abdominal pain, hypoxia; rover FINDINGS: LUNGS AND PLEURA: No focal pulmonary opacity. No pulmonary edema. No pleural effusion. No pneumothorax.  HEART AND MEDIASTINUM: No acute abnormality of the cardiac and mediastinal silhouettes. BONES AND SOFT TISSUES: No acute osseous abnormality. VISUALIZED UPPER ABDOMEN: Gaseous distention of stomach partially visible. IMPRESSION: 1. No acute cardiopulmonary process. Electronically signed by: Waddell Calk MD 02/20/2024 06:25 AM EDT RP Workstation: [id number]   CT Angio Chest/Abd/Pel for Dissection W and/or W/WO Result Date: 02/20/2024 EXAM: CT CHEST, ABDOMEN AND PELVIS WITH AND WITHOUT CONTRAST 02/20/2024 03:50:09 AM TECHNIQUE: CT of the chest, abdomen and pelvis was performed with and without the administration of intravenous contrast. Multiplanar reformatted images are provided for review. Automated exposure control, iterative reconstruction, and/or weight based adjustment of the mA/kV was utilized to reduce the radiation dose to as low as  reasonably achievable. COMPARISON: None available. CLINICAL HISTORY: Acute aortic syndrome (AAS) suspected; severe bilateral lower abdominal pain, history of factor V leiden. Patient complains of sudden onset abdominal pain with radiation to both sides of lower abdomen and diarrhea x 2-3 hours ago, denies nausea/vomiting. FINDINGS: CHEST: MEDIASTINUM AND LYMPH NODES: No intracranial hematoma, dissection, or aneurysm. No significant atherosclerotic calcification. LUNGS AND PLEURA: No pulmonary embolism to the segmental level. Central pulmonary arteries are of normal caliber. No pleural effusion or pneumothorax. ABDOMEN AND PELVIS: LIVER: The liver is unremarkable. GALLBLADDER AND BILE DUCTS: Status post cholecystectomy. No biliary ductal dilatation. SPLEEN: No acute abnormality. PANCREAS: No acute abnormality. ADRENAL GLANDS: No acute abnormality. KIDNEYS, URETERS AND BLADDER: No stones in the kidneys or ureters. No hydronephrosis. No perinephric or periureteral stranding. Urinary bladder is unremarkable. GI AND BOWEL: There is a high-grade mid small bowel obstruction within the right hemipelvis. The small bowel proximal to this point is fluid-filled and dilated up to 3 cm in diameter. Distally, the bowel is decompressed as is the colon. Appendix is absent. REPRODUCTIVE ORGANS: No acute abnormality. PERITONEUM AND RETROPERITONEUM: Mild ascites. No free intraperitoneal gas. VASCULATURE: Thoracoabdominal aorta is normal in course and caliber. ABDOMINAL AND PELVIS LYMPH NODES: No lymphadenopathy. BONES AND SOFT TISSUES: Osseous structures are age appropriate. No acute bone abnormality. No lytic or blastic bone lesion. IMPRESSION: 1. No evidence of acute aortic syndrome. 2. No pulmonary embolism to the segmental level. 3. High-grade mid small bowel obstruction within the right hemipelvis with proximal small bowel dilation up to 3 cm and decompressed distal bowel and colon. No free intraperitoneal gas. Mild ascites.  Electronically signed by: Dorethia Molt MD 02/20/2024 04:02 AM EDT RP Workstation: [id number]    Labs:  Basic Metabolic Panel: Recent Labs  Lab 03/05/24 0845 03/06/24 0846 03/07/24 0543 03/08/24 1032  NA 139 137 138 138  K 3.6 3.7 3.2* 4.0  CL 105 104 105 104  CO2 25 23 24 25   GLUCOSE 136* 138* 160* 195*  BUN 7* 7* 8 6*  CREATININE 0.80 0.74 0.73 0.72  CALCIUM 8.2* 8.0* 7.9* 8.4*  MG 2.0  --  2.0 1.7    CBC: Recent Labs  Lab 03/05/24 0845 03/07/24 0543 03/08/24 1032  WBC 9.8 7.6 7.7  NEUTROABS  --   --  5.6  HGB 9.7* 9.1* 10.3*  HCT 30.4* 28.7* 33.0*  MCV 89.1 89.1 89.7  PLT 348 323 376    CBG: Recent Labs  Lab 03/08/24 0617 03/08/24 1636 03/09/24 0655 03/09/24 1642 03/10/24 0558  GLUCAP 139* 142* 118* 117* 131*    Brief HPI:   Kristine [name] is a 68 y.o. female  with PMHx of  Anxiety, Arthritis (2025), Basal cell carcinoma, Cataract (2019), Clotting disorder (2009), Depression (1989), DVT (deep venous thrombosis) (  HCC) (05/13/2006), Esophageal erosions, Factor V Leiden, Gastritis (05/13/1988), GERD (gastroesophageal reflux disease), adenomatous polyp of colon (12/06/2022), Hyperlipidemia, Hypertension, and Radial head fracture (05/13/2009).  They presented to the hospital on 10 - 9 - 25 with abdominal pain and cramping starting the day prior; she had had similar episodes in the past lasting 2 to 3 weeks.  CT angio of the chest abdomen and pelvis revealed high-grade mid small bowel obstruction with right hemipelvis and proximal small bowel dilation up to 3 cm and decompressed distal bowel and colon.  CT was negative for PE or aortic syndrome.  Lactic acid negative.  Patient was admitted for further assessment and surgical consultation.  Surgery initially recommended NG tube placement for decompression and conservative care, however repeat x-ray 10-12 and 13 showed dilated loops of small bowel, and 8-hour delay contrast was still in her stomach.  She was taken to  the OR on 10-13 for diagnostic laparoscopy with Dr. Vernetta, unfortunately upon entering the abdomen with the camera and inadvertent small bowel enterotomy was created in the dilated small bowel.  The procedure was converted to an open laparotomy with identified abdominal adhesion creating a significant stricture in the distal small bowel, with stricturoplasty performed.  Postop, her course was complicated by persistent ileus with large volume diarrhea.  On 10-19, she was noted to have purulent drainage from her from her abdominal incision, treated with broad-spectrum antibiotics.  CT 10-19 noting small bowel dilation up to 4 cm, and a thin walled pelvic fluid collection between the uterus and bladder consistent with postoperative seroma.  She is advanced to a soft diet on 10-20 and was weaned off TPN. Repeat CT on 10-23 noted stable pelvic fluid collection, and midline anterior abdominal wall collection consistent with abscess or hematoma measuring 3.8 x 2.2 x 3.2 cm.  IR placed a drain in the pelvic fluid collection on 10-24 - culture pending.  Hospitalization was otherwise complicated by AKI, anasarca/moderate protein calorie malnutrition, hypokalemia, hypomagnesemia, prediabetes, hypertension, factor V Leiden with prior DVTs, GERD, and anxiety and depression. She currently requires min assist from ability and CGA for transfers.    Inpatient Rehabilitation Course: LATAISHA COLAN was admitted to rehab 03/07/2024 for inpatient therapies to consist of PT, ST and OT at least three hours five days a week. Past admission physiatrist, therapy team and rehab RN have worked together to provide customized collaborative inpatient rehab.  Anticoagulation: Lovenox DVT prophylaxis and aspirin 81 mg daily.  Pain Management: Patient has not required much use of PRNs.  Continue Robaxin 500 mg, tramadol 50 to 100 mg every 6 hours as needed for moderate to severe pain and Tylenol 1000 mg every 6 hours as  needed.  Mood/Behavior/Sleep: Continue home bupropion  300 mg daily.  Patient required alprazolam  0.25 mg nightly.   Skin/Wound Care: Midline abdominal incision with daily dressing changes to continue as needed.  Desitin ointment to buttocks 4 times a day to prevent MASD. Wound care/signs and symptoms of infection discussed at discharge.   Right pelvic abscess: JP drains placed by IR 10/24.  CT scan completed revealed improvement of previous fluid collection.  Follow-up at outpatient drain clinic.  Ileus/diarrhea: now stable, continue lomotil only as needed.    Fluid/Nutrition/Electrolytes: Moderate protein calorie malnutrition.  Dietary was consulted and patient continued on soft diet with nightly probiotics.    Peripheral edema/anasarca: Continue Lasix  20 mg and potassium 20 mEq supplementation while on Lasix  as needed.   Postop abdominal wall infection: Completed Zosyn 10/20-10/29.  Hypertension: Toprol -XL discontinued.  Continue Coreg 25 mg twice daily and begin amlodipine 5 mg daily for better control.  Lasix  20 mg to be used as needed for edema.  T2DM: Hemoglobin A1c 5.8 on 12-2023. Metformin  XR 1000 mg daily. Diabetes has been monitored. Diabetic teaching was completed.    Planned Outpatient Follow-Up:  -General surgery -Interventional radiology, drain management -PCP -PM&R     Rehab course: During patient's stay in rehab weekly team conferences were held to monitor patient's progress, set goals and discuss barriers to discharge. At admission, patient required min assist from ability and CGA for transfers.  Occupational Therapy: Patient has met ALL long term goals due to improved activity tolerance, improved balance, and improved coordination/attention.  Patient to discharge at overall modified independent, with good understanding and adherence to precautions. Patient's care partner (Husband) is independent to provide the necessary physical assistance at discharge. She will  benefit from ongoing OT in outpatient setting to continue to advance functional skills in the area of BADL.   Physical Therapy: Patient has met ALL long term goals due to improved activity tolerance, improved balance, improved postural control, increased strength, decreased pain, ability to compensate for deficits, improved attention, improved awareness, and improved coordination.  Patient to discharge at an ambulatory level modified independent. She will benefit from ongoing skilled PT services in outpatient setting to continue to advance safe functional mobility, address ongoing impairments in strength, ROM, balance, endurance, gait, and minimize fall risk.    Discharge plan was discussed with patient and/or family member and they verbalized understanding and agreed with it.      Disposition:  Discharge disposition: 01-Home or Self Care      Diet: Regular soft  Special Instructions:  -Patient will flush drain QD with 5 cc NS, record output QD, dressing changes every 2-3 days or earlier if soiled.   -No driving or operating heavy machinery until cleared by provider  -No smoking or alcohol or illicit drug use    Discharge Instructions     Ambulatory referral to Physical Therapy   Complete by: As directed       Allergies as of 03/12/2024       Reactions   Oxycodone-acetaminophen    Hallucinations   Paroxetine Hives   Sulfa Antibiotics    Gi upset   Amoxicillin-pot Clavulanate Other (See Comments)   REACTION: GI   Calcium    REACTION: muscle aches   Erythromycin Other (See Comments)   REACTION: GI   Guaifenesin    Feels yucky can tolerate lowest dosage in ER   Naproxen    REACTION: GI   Oxycodone-aspirin    REACTION: reaction not known   Sertraline Rash, Other (See Comments)   REACTION: hives        Medication List     STOP taking these medications    ciclopirox 8 % solution Commonly known as: PENLAC   cyclobenzaprine  10 MG tablet Commonly known as:  FLEXERIL    ESOMEPRAZOLE  MAGNESIUM  PO Replaced by: pantoprazole 40 MG tablet   PROBIOTIC PO       TAKE these medications    Acetaminophen Extra Strength 500 MG Tabs Take 2 tablets (1,000 mg total) by mouth every 6 (six) hours as needed for mild pain (pain score 1-3) or headache. What changed: reasons to take this   Acidophilus Caps capsule Take 1 capsule by mouth daily.   ALPRAZolam  0.25 MG tablet Commonly known as: XANAX  Take 1 tablet (0.25 mg total) by mouth at bedtime.  amoxicillin-clavulanate 875-125 MG tablet Commonly known as: AUGMENTIN Take 1 tablet by mouth every 12 (twelve) hours.   aspirin EC 81 MG tablet Take 1 tablet (81 mg total) by mouth daily. Swallow whole.   bisacodyl 10 MG suppository Commonly known as: DULCOLAX Place 1 suppository (10 mg total) rectally daily as needed for moderate constipation.   buPROPion  300 MG 24 hr tablet Commonly known as: WELLBUTRIN  XL Take 1 tablet (300 mg total) by mouth daily. TAKE 1 TABLET BY MOUTH EVERY DAY   diphenoxylate-atropine 2.5-0.025 MG tablet Commonly known as: LOMOTIL Take 2 tablets by mouth 4 (four) times daily as needed for diarrhea or loose stools.   fluconazole 150 MG tablet Commonly known as: DIFLUCAN Take 1 tablet (150 mg total) by mouth once as needed for up to 1 dose.   fluticasone  50 MCG/ACT nasal spray Commonly known as: FLONASE  PLACE 2 SPRAYS INTO THE NOSE DAILY AS NEEDED   furosemide  20 MG tablet Commonly known as: LASIX  Take 1 tablet by mouth daily as needed or twice weekly as needed for swelling What changed:  how much to take how to take this when to take this additional instructions   lisinopril 10 MG tablet Commonly known as: ZESTRIL Take 1 tablet (10 mg total) by mouth daily.   metFORMIN  500 MG 24 hr tablet Commonly known as: GLUCOPHAGE -XR Take 2 tablets (1,000 mg total) by mouth daily.   methocarbamol 500 MG tablet Commonly known as: ROBAXIN Take 1 tablet (500 mg total) by  mouth every 8 (eight) hours as needed for muscle spasms.   metoprolol  succinate 25 MG 24 hr tablet Commonly known as: TOPROL -XL Take 1 tablet (25 mg total) by mouth daily. Take with or immediately following a meal.   Normal Saline Flush 0.9 % Soln Flush drain ONCE daily with 5 cc NS   pantoprazole 40 MG tablet Commonly known as: PROTONIX Take 1 tablet (40 mg total) by mouth daily before breakfast. Replaces: ESOMEPRAZOLE  MAGNESIUM  PO   potassium chloride  10 MEQ tablet Commonly known as: KLOR-CON  Take 1 tablet (10 mEq total) by mouth daily. AND AS NEEDED WITH YOUR LASIX  What changed:  when to take this additional instructions   psyllium 95 % Pack Commonly known as: HYDROCIL/METAMUCIL Take 1 packet by mouth 2 (two) times daily.   simvastatin  10 MG tablet Commonly known as: ZOCOR  TAKE 1 TABLET BY MOUTH EVERY DAY   VITAMIN D3 PO Take 5,000 Units by mouth daily.   ZYRTEC PO Take 1 tablet by mouth daily as needed (for allergies).        Follow-up Information     Urbano Albright, MD Follow up.   Specialty: Physical Medicine and Rehabilitation Why: No formal follow-up needed Contact information: [address] [place] [address] [phone]         Vernetta Berg, MD Follow up.   Specialty: General Surgery Why: Call for appointment Contact information: [address] [place] [address] [phone]         Jenna Cordella LABOR, MD Follow up.   Specialties: Radiology, Interventional Radiology Why: Call for appointment Contact information: [address] [place] [address] [place] [address] [phone]         [facility] Follow up.   Specialty: Radiology Why: IR scheduler will call with appoinment date and time. Please call [phone] or after hours number [phone] with any questions or concerns. Contact information: [address] [place]  [phone]  Signed: Daphne LOISE Satterfield 03/10/2024, 3:48 PM

## 2024-03-10 NOTE — Progress Notes (Signed)
 Occupational Therapy Discharge Summary  Patient Details  Name: Kristine [name] MRN: [id number] Date of Birth: November 14, 1955  Date of Discharge from OT service:March 11, 2024   Patient has met 6 of 6 long term goals due to improved activity tolerance, improved balance, postural control, ability to compensate for deficits, improved attention, improved awareness, and improved coordination.  Patient to discharge at overall Modified Independent level.  Patient's care partner is independent to provide the necessary physical assistance at discharge.    Reasons goals not met: n/a  Recommendation:  No OT recommended   Equipment: No equipment provided  Reasons for discharge: treatment goals met and discharge from hospital  Patient/family agrees with progress made and goals achieved: Yes  OT Discharge Precautions/Restrictions  Precautions Precautions: Fall Recall of Precautions/Restrictions: Intact Precaution/Restrictions Comments: PICC; TPN, drains - no shower Restrictions Weight Bearing Restrictions Per Provider Order: No General   Vital Signs   Pain Pain Assessment Pain Scale: 0-10 Pain Score: 0-No pain ADL ADL Eating: Independent Grooming: Independent Where Assessed-Grooming: Sitting at sink Upper Body Bathing: Independent, Modified independent Where Assessed-Upper Body Bathing: Standing at sink, Sitting at sink Lower Body Bathing: Modified independent Where Assessed-Lower Body Bathing: Sitting at sink, Standing at sink Upper Body Dressing: Modified independent (Device) Where Assessed-Upper Body Dressing: Sitting at sink Lower Body Dressing: Modified independent Where Assessed-Lower Body Dressing: Sitting at sink Toileting: Modified independent Where Assessed-Toileting: Neurosurgeon Method: Proofreader: Gaffer: Modified independent Engineer, Materials Method: Ambulating Vision Baseline Vision/History: 0 No visual deficits;1 Wears glasses Patient Visual Report: No change from baseline Vision Assessment?: No apparent visual deficits Perception  Perception: Within Functional Limits Praxis Praxis: WFL Cognition Cognition Overall Cognitive Status: Within Functional Limits for tasks assessed Memory: Appears intact Awareness: Appears intact Problem Solving: Appears intact Safety/Judgment: Appears intact Sensation Sensation Light Touch: Appears Intact Hot/Cold: Appears Intact Proprioception: Appears Intact Stereognosis: Appears Intact Coordination Gross Motor Movements are Fluid and Coordinated: Yes Fine Motor Movements are Fluid and Coordinated: Yes Motor  Motor Motor: Within Functional Limits Mobility  Bed Mobility Bed Mobility: Rolling Left;Supine to Sit;Sit to Supine Rolling Left: Independent with assistive device Supine to Sit: Independent with assistive device Sit to Supine: Independent with assistive device Transfers Sit to Stand: Independent with assistive device Stand to Sit: Independent with assistive device  Trunk/Postural Assessment  Cervical Assessment Cervical Assessment: Within Functional Limits Thoracic Assessment Thoracic Assessment: Within Functional Limits Lumbar Assessment Lumbar Assessment: Within Functional Limits Postural Control Postural Control: Within Functional Limits  Balance Balance Balance Assessed: Yes Static Sitting Balance Static Sitting - Level of Assistance: 7: Independent Dynamic Sitting Balance Dynamic Sitting - Level of Assistance: 7: Independent Static Standing Balance Static Standing - Level of Assistance: 7: Independent Dynamic Standing Balance Dynamic Standing - Level of Assistance: 6: Modified independent (Device/Increase time) Extremity/Trunk Assessment RUE Assessment RUE Assessment: Within Functional Limits Active Range of  Motion (AROM) Comments: WFL LUE Assessment LUE Assessment: Within Functional Limits Active Range of Motion (AROM) Comments: WFL   D'mariea L Green 03/10/2024, 12:56 PM

## 2024-03-10 NOTE — Progress Notes (Signed)
 Patient ID: Kristine [name], female   DOB: Nov 21, 1955, 68 y.o.   MRN: [id number] Interventional Radiology Procedure Note  Procedure: drain injection    Complications: None  Estimated Blood Loss:  0  Findings: Small residual collapsed cavity(improving abscess) Cont suction bulb F/u in 2 weeks as an outpt with IR    EMERSON FREDERIC SPECKING, MD

## 2024-03-10 NOTE — Progress Notes (Signed)
 Occupational Therapy Session Note  Patient Details  Name: Kristine [name] MRN: [id number] Date of Birth: [birth year]  Today's Date: 03/10/2024 OT Individual Time: 9152-9067 OT Individual Time Calculation (min): 45 min    Short Term Goals: Week 1:  OT Short Term Goal 1 (Week 1): STGs = LTGs  Skilled Therapeutic Interventions/Progress Updates:   Patient agreeable to participate in OT session. Reports 0/10 pain level.   Patient participated in skilled OT session focusing on self care toileting, grooming, functional mobility, and functional activity tolerance. Patient received going to the bathroom. Completed grooming at sink, re organization of room/ light housekeeping, and shoes mod I. Patient then completed functional mobility to gym 150 ft mod I, completed Nustep for 10 minutes to increase activity tolerance. Completed functional mobility back to room 150 ft with mod I. Returned to chair all needs in reach.   Therapy Documentation Precautions:  Precautions Precautions: Fall Recall of Precautions/Restrictions: Intact Precaution/Restrictions Comments: PICC; TPN, drains - no shower Restrictions Weight Bearing Restrictions Per Provider Order: No Therapy/Group: Individual Therapy  [name] 03/10/2024, 7:12 AM

## 2024-03-10 NOTE — Progress Notes (Signed)
 PROGRESS NOTE   Subjective/Complaints: Pt would like some medications changed from AM to PM.  Radiology recommending to continue drain for now, f/u outpatient.  LBM today  ROS: Patient denies fever, chills, nausea, vomiting,   shortness of breath or chest pain, headache, or mood change. No new motor or sensory changes.  Loose Bms- intermittant  Objective:   CT ABDOMEN PELVIS W CONTRAST Result Date: 03/08/2024 EXAM: CT ABDOMEN AND PELVIS WITH CONTRAST 03/08/2024 07:53:01 PM TECHNIQUE: CT of the abdomen and pelvis was performed with the administration of 75 mL of iohexol (OMNIPAQUE) 350 MG/ML injection. Multiplanar reformatted images are provided for review. Automated exposure control, iterative reconstruction, and/or weight-based adjustment of the mA/kV was utilized to reduce the radiation dose to as low as reasonably achievable. COMPARISON: None available. CLINICAL HISTORY: Intra-abdominal abscess. FINDINGS: LOWER CHEST: No acute abnormality. LIVER: The liver is unremarkable. GALLBLADDER AND BILE DUCTS: Status post cholecystectomy. No biliary ductal dilatation. SPLEEN: No acute abnormality. PANCREAS: No acute abnormality. ADRENAL GLANDS: No acute abnormality. KIDNEYS, URETERS AND BLADDER: Punctate 1 - 2 mm nonobstructing calculus within the lower pole of the left kidney. No ureteral calculi. No hydronephrosis. No perinephric inflammatory stranding or fluid collections were identified. The bladder is unremarkable. GI AND BOWEL: The stomach, small bowel, and large bowel are otherwise unremarkable. Appendix normal. There is no bowel obstruction. PERITONEUM AND RETROPERITONEUM: Interval left transgluteal pigtail drainage catheter placement with near complete evacuation of the loculated fluid collection within the cul-de-sac. Stable 2.1 cm rim enhancing collection within the right adnexa. No new loculated intraabdominal fluid collections. No free  intraperitoneal gas or fluid. Stable mesenteric edema likely postsurgical in nature. VASCULATURE: Aorta is normal in caliber. LYMPH NODES: No lymphadenopathy. REPRODUCTIVE ORGANS: Stable 2.1 cm rim enhancing collection within the right adnexa. BONES AND SOFT TISSUES: Laparotomy incision again identified with superficial dehiscence in the region of the umbilicus and minimal gas fluid seen tracking into the left rectus sheath medially undermining the left abdominal wall subcutaneous fat. No discrete drainable fluid collection. No intraperitoneal extension. Osseous structures are age appropriate. No acute bone abnormality. No lytic or blastic bone lesion. IMPRESSION: 1. Interval left transgluteal pigtail drainage catheter placement with near-complete evacuation of the prior cul-de-sac fluid collection; no new loculated intra-abdominal collections. 2. Stable 2.1 cm rim-enhancing collection within the right adnexa. 3. Laparotomy incision with superficial dehiscence at the umbilicus and minimal gas/fluid tracking into the left rectus sheath with mild subcutaneous undermining; no discrete drainable collection and no intraperitoneal extension. 4. Punctate 12 mm nonobstructing left lower pole renal calculus. Electronically signed by: Dorethia Molt MD 03/08/2024 10:11 PM EDT RP Workstation: [id number]   Recent Labs    03/08/24 1032  WBC 7.7  HGB 10.3*  HCT 33.0*  PLT 376   Recent Labs    03/08/24 1032  NA 138  K 4.0  CL 104  CO2 25  GLUCOSE 195*  BUN 6*  CREATININE 0.72  CALCIUM 8.4*    Intake/Output Summary (Last 24 hours) at 03/10/2024 0956 Last data filed at 03/10/2024 0810 Gross per 24 hour  Intake 840 ml  Output 40 ml  Net 800 ml        Physical  Exam: Vital Signs Blood pressure (!) 151/67, pulse 70, temperature 98.4 F (36.9 C), resp. rate 18, height 5' 5.5 (1.664 m), weight 89.5 kg, SpO2 96%.  Constitutional: No apparent distress. Appropriate appearance for age.  Working with  therapy in the gym HENT: [place], AT Eyes: PERRLA. EOMI. Visual fields grossly intact.  Cardiovascular: 1+ b/l LE Edema.   Respiratory: Non-labored breathing Abdomen: ND, NT     Skin: C/D/I. No apparent lesions. + Midline abdominal incision -covered in dry ABD pad + Right flank/buttocks drain.  C-D-I, small amount fluid in tubing and drain + Right PICC line, CDI + Left midline, CDI   MSK:      No apparent deformity.     Neurologic exam: Alert and awake, follows commands CN: 2-12 grossly intact.   Moving all 4 to gravity and resistance  Prior exam:      Strength:                RUE: 5/5 SA, 5/5 EF, 5/5 EE, 5/5 WE, 5/5 FF, 5/5 FA                  LUE: 5/5 SA, 5/5 EF, 5/5 EE, 5/5 WE, 5/5 FF, 5/5 FA                  RLE: 4/5 HF, 4/5 KE, 5/5 DF, 5/5 EHL, 5/5 PF                  LLE:  5/5 HF, 5/5 KE, 5/5 DF, 5/5 EHL, 5/5 PF      Assessment/Plan: 1. Functional deficits which require 3+ hours per day of interdisciplinary therapy in a comprehensive inpatient rehab setting. Physiatrist is providing close team supervision and 24 hour management of active medical problems listed below. Physiatrist and rehab team continue to assess barriers to discharge/monitor patient progress toward functional and medical goals  Care Tool:  Bathing    Body parts bathed by patient: Right arm, Left arm, Abdomen, Chest, Front perineal area, Buttocks, Right upper leg, Left upper leg, Right lower leg, Left lower leg, Face         Bathing assist Assist Level: Set up assist     Upper Body Dressing/Undressing Upper body dressing   What is the patient wearing?: Hospital gown only    Upper body assist Assist Level: Set up assist    Lower Body Dressing/Undressing Lower body dressing      What is the patient wearing?: Underwear/pull up, Pants     Lower body assist Assist for lower body dressing: Set up assist     Toileting Toileting    Toileting assist Assist for toileting: Independent with  assistive device     Transfers Chair/bed transfer  Transfers assist     Chair/bed transfer assist level: Independent with assistive device Chair/bed transfer assistive device: Armrests   Locomotion Ambulation   Ambulation assist      Assist level: Supervision/Verbal cueing Assistive device: No Device Max distance: 250   Walk 10 feet activity   Assist     Assist level: Supervision/Verbal cueing Assistive device: No Device   Walk 50 feet activity   Assist    Assist level: Supervision/Verbal cueing Assistive device: No Device    Walk 150 feet activity   Assist    Assist level: Supervision/Verbal cueing Assistive device: Other (comment) (pushing w/c)    Walk 10 feet on uneven surface  activity   Assist     Assist level: Supervision/Verbal cueing  Wheelchair     Assist Is the patient using a wheelchair?: No             Wheelchair 50 feet with 2 turns activity    Assist            Wheelchair 150 feet activity     Assist          Blood pressure (!) 151/67, pulse 70, temperature 98.4 F (36.9 C), resp. rate 18, height 5' 5.5 (1.664 m), weight 89.5 kg, SpO2 96%.  Medical Problem List and Plan: 1. Functional deficits secondary to debility from small bowel obstruction status post ex lap 10-19 Dr. Vernetta              -patient may not shower             -ELOS/Goals: 10 to 12 days, supervision PT/OT             -Continue CIR  -Team conference today please see physician documentation under team conference tab, met with team  to discuss problems,progress, and goals. Formulized individual treatment plan based on medical history, underlying problem and comorbidities.   -Will check with surgery about staples if ok to come out    2.  Antithrombotics/Hx DVTs d/t Factor V Leiden: -DVT/anticoagulation:  Pharmaceutical: Lovenox 40 mg daily             -antiplatelet therapy: ASA 81 mg daily --resumed by hospitalist just prior  to rehab admission             - Negative bilateral lower extremity duplex 10-21   3. Pain Management: Has not been using PRNs much.  Consolidate to Tylenol 1000 mg every 6 hours as needed and tramadol 50 to 100 mg every 6 hours as needed for moderate to severe pain.  Robaxin 500 mg as needed.  -10/29 pain overall under control   4. Mood/Behavior/Sleep: Xanax  0.25 nightly, buproprion 300 mg daily             -antipsychotic agents: None              - Trazodone PRN added   5. Neuropsych/cognition: This patient is capable of making decisions on her own behalf. 6. Skin/Wound Care:               - Midline abdominal incision, change dressings daily and as needed              -  advised patient to use pillows and try to sleep on her right side to avoid putting pressure on plastic piece adhered to her back.              - Destin ointment to buttocks 4 times daily to prevent MASD  -Wound care orders placed   7. Fluids/Electrolytes/Nutrition:  AM labs pending.  Periodic hypomagnesemia/hypokalemia.   8.  Moderate protein calorie malnutrition.              - Dietary consult              - Soft diet, probiotics nightly   9.  Stubborn ileus/diarrhea.              - Patient endorses passing whole pills, Lasix  and K Dur.  Dietary order placed and nursing informed to crush pills when possible to assist in absorption.              - Psyllium twice daily              -  Per surgery,  avoid Lomotil, imodium- Looked in to this further, surgery was ok with lomotil trial on 10/19 and she was using this intermittently during acute hospitalization. I think its ok for her to continue this PRN              - Monitor fluids closely, may need maintenance IV fluids   10.  Peripheral edema/anasarca             - On Lasix  20 mg daily; 2x weeks and as needed at home             - Potassium supplementation while on Lasix , 20 mill equivalents daily.   11.  Right pelvic abscess. JP Drain placed by IR 10-24              - Cultures pending              - Will need IR to follow-up on drain care, timeline for removal  -IR notified, seen today, appreciate assistance. Will need to contact IR APP or on call IR MD prior to patient d/c   -CT scan completed ordered by IR, fluid collection improved, will contact IR closer to DC as above   12.  Postop abdominal wall infection.             - Continue Zosyn (started 10/20; will need to follow up with general surgery on duration)  -Called surgery 10/28- Burnard Ohara- OK to DC zosyn today   13.  History of diabetes.  Hemoglobin A1c 5.8 on 12-2023.  Metformin  XR 1000 mg daily -- resumed by hospitalist just prior to inpatient rehab admission.              - Check blood sugars twice daily to start  -10/27-29 stable this am, monitor   CBG (last 3)  Recent Labs    03/09/24 0655 03/09/24 1642 03/10/24 0558  GLUCAP 118* 117* 131*      14.  Hypertension.  Switched from Toprol -XL to Coreg 25 mg twice daily.  Lasix  as above.    03/10/2024    5:29 AM 03/09/2024    8:01 PM 03/09/2024    1:11 PM  Vitals with BMI  Systolic 151 147 873  Diastolic 67 70 64  Pulse 70 77 70  Start amlodipine 5mg  daily for BP control  10/28 monitor response to medication change  10/29 BP control improved, continue to monitor   15.  Allergies.  Has as needed Zyrtec, Flonase .       LOS: 3 days A FACE TO FACE EVALUATION WAS PERFORMED  Murray Collier 03/10/2024, 9:56 AM

## 2024-03-10 NOTE — Progress Notes (Signed)
 Occupational Therapy Session Note  Patient Details  Name: REANN DOBIAS MRN: [id number] Date of Birth: 1956-05-04  Today's Date: 03/10/2024 OT Individual Time: 1500-1540 OT Individual Time Calculation (min): 40 min    Short Term Goals: Week 1:  OT Short Term Goal 1 (Week 1): STGs = LTGs  Skilled Therapeutic Interventions/Progress Updates:      Therapy Documentation Precautions:  Precautions Precautions: Fall Recall of Precautions/Restrictions: Intact Precaution/Restrictions Comments: PICC; TPN, drains - no shower Restrictions Weight Bearing Restrictions Per Provider Order: No General: Pt seated in recliner upon OT arrival, agreeable to OT.  Pain: no pain reported  Other Treatments: OT providing therapeutic use of self in order to build rapport and discuss patient current situation and goals for therapy. OT providing education for energy conservation describing what it is, purpose of conserving energy and specific examples of how to conserve energy during ADL and IADL tasks. OT also educating on bathing d/t precautions with tubes and surgical incisions. Pt appreciative for information with good carryover.    Pt seated in recliner at end of session with W/C alarm donned, call light within reach and 4Ps assessed.    Therapy/Group: Individual Therapy  Camie Hoe, OTD, OTR/L 03/10/2024, 4:10 PM

## 2024-03-10 NOTE — Progress Notes (Signed)
 Physical Therapist participated in the interdisciplinary team conference, providing clinical information regarding the patient's current status, treatment goals, and weekly focus, including any barriers that need to be addressed. Please see the Inpatient Rehabilitation Team Conference and Plan of Care Update for further details.     Comer Pines, DPT

## 2024-03-10 NOTE — Patient Care Conference (Signed)
 Inpatient RehabilitationTeam Conference and Plan of Care Update Date: 03/10/2024   Time: 09:54 AM    Patient Name: Kristine [name]      Medical Record Number: [id number]  Date of Birth: [birth year] Sex: Female         Room/Bed: 4M06C/4M06C-01 Payor Info: Payor: MEDICARE / Plan: MEDICARE PART A AND B / Product Type: *No Product type* /    Admit Date/Time:  03/07/2024  4:31 PM  Primary Diagnosis:  Debility  Hospital Problems: Principal Problem:   Debility Active Problems:   HYPERCHOLESTEROLEMIA, PURE   Depression with anxiety   GASTROESOPHAGEAL REFLUX DISEASE   Obesity   Prediabetes   Essential hypertension   SBO (small bowel obstruction) (HCC)   Malnutrition of moderate degree   Pelvic abscess in female    Expected Discharge Date: Expected Discharge Date: 03/12/24  Team Members Present: Physician leading conference: Dr. Murray Collier Social Worker Present: Rhoda Clement, LCSW Nurse Present: Barnie Ronde, RN PT Present: Doreene Orris, PT;Katie Green, PT OT Present: Michaelyn Seip, OT PPS Coordinator present : Eleanor Colon, SLP     Current Status/Progress Goal Weekly Team Focus  Bowel/Bladder   Currently continent with B/B  LBM 10/28   Will remain continent with normal pattern   Assist with toilet needs qshift/prn    Swallow/Nutrition/ Hydration               ADL's   mod I to SUP,   mod I   OT had dc 3-5 days. no OT FU. fam ed? [facility]    Mobility   bed mob mod I; sit to stand/transfers mod I; ambulation (pushing IV pole) sup/mod I; stairs sup   mod I  DC 10/31; FU OPPT; DME - has access to RW and SPC at home/do not anticipate need for anything else; barriers to DC - medical; fam ed - not needed from PT standpoint (Simultaneous filing. User may not have seen previous data.)    Communication                Safety/Cognition/ Behavioral Observations               Pain   No endorsement of pain at this time   Will be free from pain    Assesspain qshift/prn    Skin   Surgical incision to abdomen and left buttocks. Dressing in place   Will be free from infection  Assess for s/s of infection at incision site, promote healing and provide education to prevent infection      Discharge Planning:  HOme with husband who works part time from home. Pt is doing well but need answers regarding length of IV antibiotics and if going home with drain. Await medical answers   Team Discussion: Patient admitted with debility post SBO; exp. Laparotomy. IV abx. Completed 03/10/24.  Penrose drain and staples removed 03/09/24. Continue W-D dressing to abd incision and JP drain. Patient on target to meet rehab goals: yes, currently needs supervision for ADLs and mobility.  *See Care Plan and progress notes for long and short-term goals.   Revisions to Treatment Plan:  CT scan of drain 1028/25 Neuro psych referral   Teaching Needs: Safety, medications, transfers, toileting, skin care/Incision care, etc.   Current Barriers to Discharge: Decreased caregiver support and Wound care  Possible Resolutions to Barriers: Family education OP follow up services     Medical Summary Current Status: debility, wound, diarrhea, edema, pelvic abscess, DM, HTN  Barriers to Discharge:  Complicated Wound;Medical stability;Self-care education   Possible Resolutions to Barriers/Weekly Focus: debility, wound, diarrhea, edema, pelvic abscess, DM, HTN   Continued Need for Acute Rehabilitation Level of Care: The patient requires daily medical management by a physician with specialized training in physical medicine and rehabilitation for the following reasons: Direction of a multidisciplinary physical rehabilitation program to maximize functional independence : Yes Medical management of patient stability for increased activity during participation in an intensive rehabilitation regime.: Yes Analysis of laboratory values and/or radiology reports with any  subsequent need for medication adjustment and/or medical intervention. : Yes   I attest that I was present, lead the team conference, and concur with the assessment and plan of the team.   Fredericka Sober B 03/10/2024, 1:30 PM

## 2024-03-10 NOTE — Progress Notes (Signed)
 Physical Therapy Session Note  Patient Details  Name: Kristine [name] MRN: [id number] Date of Birth: 01/21/1956  Today's Date: 03/10/2024 PT Individual Time: 1000-1056, 8699-8587 PT Individual Time Calculation (min): 56 min, 72 min  Short Term Goals: Week 1:  PT Short Term Goal 1 (Week 1): = LTGS  Skilled Therapeutic Interventions/Progress Updates:     Treatment 1: Pt seated in recliner upon arrival. Pt denies pain and agreeable to therapy.  Session emphasized activity tolerance, functional strength/endurance, and dynamic standing balance/reactionary balance. Pt performed weighted ball toss at rebounder with 2.2# and 4.4# ball standing at various distances. CGA quickly progressing to close supervision. Pt required occasional seated rest breaks during activity due to fatigue. Pt asc/desc 4-6 inch steps x4 trials with B HR initially progressing to single HR with close supervision progressing to supervision. Pt ambulated ~165 ft pushing IV pole from room to main gym with supervision. Pt ambulated ~550 ft without IV pole, no AD, and supervision. Pt requested to use toilet. Pt performed toilet transfer and all toileting tasks and hand hygiene with mod I. Pt standing in room with nursing present upon PT exiting room.  Treatment 2: Pt seated in recliner upon arrival. Pt denies pain and agreeable to therapy.  Session emphasized activity tolerance, functional strength/endurance, and dynamic standing balance. Patient instructed in and performed the following standing B LE strengthening exercises at sink for UE support: 2x10 alternating marching with single UE support, 2x10 hamstring curls with B UE support, and 2x10 hip extension with B UE support. Illustrated HEP handout was issued to pt yesterday. Pt ambulated ~330 ft to day room without AD with supervision. Pt participated in corn hole activity on floor and Airex foam pad with board placed at various distances. Pt participated basketball  dribbling and tossing into hoop placed at various distances, standing on floor and Airex foam pad. Supervision required while standing on floor and close supervision required on foam for safety. Pt ambulated back to room same distance, pushing IV pole due to increased pain in her knee after standing balance activities. Pt seated in recliner and all needs within reach at end of session.  Therapy Documentation Precautions:  Precautions Precautions: Fall Recall of Precautions/Restrictions: Intact Precaution/Restrictions Comments: PICC; TPN, drains - no shower Restrictions Weight Bearing Restrictions Per Provider Order: No  Therapy/Group: Individual Therapy  [name], DPT 03/10/2024, 7:32 AM

## 2024-03-10 NOTE — Progress Notes (Signed)
 Occupational Therapist participated in the interdisciplinary team conference, providing clinical information regarding the patient's current status, treatment goals, and weekly focus, including any barriers that need to be addressed. Please see the Inpatient Rehabilitation Team Conference and Plan of Care Update for further details.   D'mariea Osceola Depaz OTR/L

## 2024-03-10 NOTE — Plan of Care (Signed)
  Problem: Consults Goal: RH GENERAL PATIENT EDUCATION Description: See Patient Education module for education specifics. Outcome: Progressing   Problem: RH SKIN INTEGRITY Goal: RH STG SKIN FREE OF INFECTION/BREAKDOWN Description: Manage w min assist Outcome: Progressing

## 2024-03-10 NOTE — IPOC Note (Signed)
 Overall Plan of Care [facility]) Patient Details Name: Kristine [name] MRN: [id number] DOB: 08-20-1955  Admitting Diagnosis: Debility  Hospital Problems: Principal Problem:   Debility Active Problems:   HYPERCHOLESTEROLEMIA, PURE   Depression with anxiety   GASTROESOPHAGEAL REFLUX DISEASE   Obesity   Prediabetes   Essential hypertension   SBO (small bowel obstruction) (HCC)   Malnutrition of moderate degree   Pelvic abscess in female     Functional Problem List: Nursing Bowel, Pain, Endurance, Medication Management, Safety, Nutrition  PT Balance, Endurance, Pain  OT Endurance, Balance  SLP    TR         Basic ADL's: OT Dressing     Advanced  ADL's: OT Simple Meal Preparation, Light Housekeeping     Transfers: PT Bed to Chair, Car, Furniture  OT       Locomotion: PT Ambulation, Stairs     Additional Impairments: OT None  SLP        TR      Anticipated Outcomes Item Anticipated Outcome  Self Feeding no goal pt is independent  Swallowing      Basic self-care  mod ind  Toileting  no goal, pt is mod ind   Bathroom Transfers mod ind to ambulate to toilet  Bowel/Bladder  manage bowel w mod I assist  Transfers  mod I/independent  Locomotion  independent household distances; mod I longer distances and for stairs  Communication     Cognition     Pain  pain < 4 with prns  Safety/Judgment  manage safety w cues   Therapy Plan: PT Intensity: Minimum of 1-2 x/day ,45 to 90 minutes PT Frequency: 5 out of 7 days PT Duration Estimated Length of Stay: 3-5 day OT Intensity: Minimum of 1-2 x/day, 45 to 90 minutes OT Frequency: 5 out of 7 days OT Duration/Estimated Length of Stay: 3-5 days     Team Interventions: Nursing Interventions Bowel Management, Patient/Family Education, Medication Management, Pain Management, Disease Management/Prevention, Discharge Planning, Psychosocial Support  PT interventions Ambulation/gait training, Designer, jewellery, Community reintegration, Discharge planning, Disease management/prevention, DME/adaptive equipment instruction, Functional mobility training, Functional electrical stimulation, Neuromuscular re-education, Pain management, Patient/family education, Psychosocial support, Skin care/wound management, Splinting/orthotics, Stair training, Therapeutic Activities, Therapeutic Exercise, UE/LE Strength taining/ROM, Wheelchair propulsion/positioning  OT Interventions Warden/ranger, Discharge planning, DME/adaptive equipment instruction, Functional mobility training, Self Care/advanced ADL retraining, Therapeutic Exercise, UE/LE Strength taining/ROM, Therapeutic Activities  SLP Interventions    TR Interventions    SW/CM Interventions Discharge Planning, Psychosocial Support, Patient/Family Education   Barriers to Discharge MD  Medical stability and Wound care  Nursing Home environment access/layout, Decreased caregiver support, Wound Care Multi level main B+B with 6 ste bil rails w spouse who is retired and able to assist.Went out daily, was driving, 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 has DME  PT None    OT None    SLP      SW       Team Discharge Planning: Destination: PT-Home ,OT- Home , SLP-  Projected Follow-up: PT-Outpatient PT, OT-  Home health OT, SLP-  Projected Equipment Needs: PT-None recommended by PT, OT- 3 in 1 bedside comode, SLP-  Equipment Details: PT- , OT-  Patient/family involved in discharge planning: PT- Patient,  OT-Patient, SLP-   MD ELOS: 3-5 Medical Rehab Prognosis:  Excellent Assessment:The patient has been admitted for CIR therapies with the diagnosis of debility from small bowel obstruction status post ex lap 10-19 Dr. Vernetta . The team  will be addressing functional mobility, strength, stamina, balance, safety, adaptive techniques and equipment, self-care, bowel and bladder mgt, patient and caregiver education. Goals have been set at Mod  I. Anticipated discharge destination is home.       See Team Conference Notes for weekly updates to the plan of care

## 2024-03-10 NOTE — Progress Notes (Signed)
 Overall Plan of Care [facility]) Patient Details Name: Kristine [name] MRN: [id number] DOB: 08-20-1955  Admitting Diagnosis: Debility  Hospital Problems: Principal Problem:   Debility Active Problems:   HYPERCHOLESTEROLEMIA, PURE   Depression with anxiety   GASTROESOPHAGEAL REFLUX DISEASE   Obesity   Prediabetes   Essential hypertension   SBO (small bowel obstruction) (HCC)   Malnutrition of moderate degree   Pelvic abscess in female     Functional Problem List: Nursing Bowel, Pain, Endurance, Medication Management, Safety, Nutrition  PT Balance, Endurance, Pain  OT Endurance, Balance  SLP    TR         Basic ADL's: OT Dressing     Advanced  ADL's: OT Simple Meal Preparation, Light Housekeeping     Transfers: PT Bed to Chair, Car, Furniture  OT       Locomotion: PT Ambulation, Stairs     Additional Impairments: OT None  SLP        TR      Anticipated Outcomes Item Anticipated Outcome  Self Feeding no goal pt is independent  Swallowing      Basic self-care  mod ind  Toileting  no goal, pt is mod ind   Bathroom Transfers mod ind to ambulate to toilet  Bowel/Bladder  manage bowel w mod I assist  Transfers  mod I/independent  Locomotion  independent household distances; mod I longer distances and for stairs  Communication     Cognition     Pain  pain < 4 with prns  Safety/Judgment  manage safety w cues   Therapy Plan: PT Intensity: Minimum of 1-2 x/day ,45 to 90 minutes PT Frequency: 5 out of 7 days PT Duration Estimated Length of Stay: 3-5 day OT Intensity: Minimum of 1-2 x/day, 45 to 90 minutes OT Frequency: 5 out of 7 days OT Duration/Estimated Length of Stay: 3-5 days     Team Interventions: Nursing Interventions Bowel Management, Patient/Family Education, Medication Management, Pain Management, Disease Management/Prevention, Discharge Planning, Psychosocial Support  PT interventions Ambulation/gait training, Designer, jewellery, Community reintegration, Discharge planning, Disease management/prevention, DME/adaptive equipment instruction, Functional mobility training, Functional electrical stimulation, Neuromuscular re-education, Pain management, Patient/family education, Psychosocial support, Skin care/wound management, Splinting/orthotics, Stair training, Therapeutic Activities, Therapeutic Exercise, UE/LE Strength taining/ROM, Wheelchair propulsion/positioning  OT Interventions Warden/ranger, Discharge planning, DME/adaptive equipment instruction, Functional mobility training, Self Care/advanced ADL retraining, Therapeutic Exercise, UE/LE Strength taining/ROM, Therapeutic Activities  SLP Interventions    TR Interventions    SW/CM Interventions Discharge Planning, Psychosocial Support, Patient/Family Education   Barriers to Discharge MD  Medical stability and Wound care  Nursing Home environment access/layout, Decreased caregiver support, Wound Care Multi level main B+B with 6 ste bil rails w spouse who is retired and able to assist.Went out daily, was driving, 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 has DME  PT None    OT None    SLP      SW       Team Discharge Planning: Destination: PT-Home ,OT- Home , SLP-  Projected Follow-up: PT-Outpatient PT, OT-  Home health OT, SLP-  Projected Equipment Needs: PT-None recommended by PT, OT- 3 in 1 bedside comode, SLP-  Equipment Details: PT- , OT-  Patient/family involved in discharge planning: PT- Patient,  OT-Patient, SLP-   MD ELOS: 3-5 Medical Rehab Prognosis:  Excellent Assessment:The patient has been admitted for CIR therapies with the diagnosis of debility from small bowel obstruction status post ex lap 10-19 Dr. Vernetta . The team  will be addressing functional mobility, strength, stamina, balance, safety, adaptive techniques and equipment, self-care, bowel and bladder mgt, patient and caregiver education. Goals have been set at Mod  I. Anticipated discharge destination is home.       See Team Conference Notes for weekly updates to the plan of care

## 2024-03-10 NOTE — Plan of Care (Signed)
  Problem: Consults Goal: RH GENERAL PATIENT EDUCATION Description: See Patient Education module for education specifics. Outcome: Progressing Goal: Nutrition Consult-if indicated Outcome: Progressing   Problem: RH BOWEL ELIMINATION Goal: RH STG MANAGE BOWEL WITH ASSISTANCE Description: STG Manage Bowel with mod I Assistance. Outcome: Progressing   Problem: RH SKIN INTEGRITY Goal: RH STG SKIN FREE OF INFECTION/BREAKDOWN Description: Manage w min assist Outcome: Progressing Goal: RH STG ABLE TO PERFORM INCISION/WOUND CARE W/ASSISTANCE Description: STG Able To Perform Incision/Wound Care With min Assistance. Outcome: Progressing   Problem: RH SAFETY Goal: RH STG ADHERE TO SAFETY PRECAUTIONS W/ASSISTANCE/DEVICE Description: STG Adhere to Safety Precautions With cues Assistance/Device. Outcome: Progressing   Problem: RH PAIN MANAGEMENT Goal: RH STG PAIN MANAGED AT OR BELOW PT'S PAIN GOAL Description: Pain < 4 with prns Outcome: Progressing   Problem: RH KNOWLEDGE DEFICIT GENERAL Goal: RH STG INCREASE KNOWLEDGE OF SELF CARE AFTER HOSPITALIZATION Description: Patient and spouse will be able to manage care at discharge using educational resources independently Outcome: Progressing

## 2024-03-10 NOTE — Progress Notes (Signed)
 Patient ID: Kristine [name], female   DOB: February 08, 1956, 68 y.o.   MRN: [id number]  Met with pt to give team conference update regarding goal of supervision/mod/I level and discharge date of 10/31. She has no equipment needs and will need to learn how  to flush ad empty her drain at home until returns to MD in two weeks hopefully to get it out. Discussed OPPT will ask PA to make ambulatory referral to [facility] for OPPT follow up. Pt happy with plan in place.

## 2024-03-11 ENCOUNTER — Other Ambulatory Visit (HOSPITAL_COMMUNITY): Payer: Self-pay

## 2024-03-11 LAB — GLUCOSE, CAPILLARY: Glucose-Capillary: 115 mg/dL — ABNORMAL HIGH (ref 70–99)

## 2024-03-11 MED ORDER — ACETAMINOPHEN 500 MG PO TABS
1000.0000 mg | ORAL_TABLET | Freq: Four times a day (QID) | ORAL | 0 refills | Status: AC | PRN
  Filled 2024-03-11: qty 30, 4d supply, fill #0

## 2024-03-11 MED ORDER — FUROSEMIDE 20 MG PO TABS
20.0000 mg | ORAL_TABLET | ORAL | 0 refills | Status: DC
  Filled 2024-03-11: qty 30, 30d supply, fill #0

## 2024-03-11 MED ORDER — LISINOPRIL 5 MG PO TABS
10.0000 mg | ORAL_TABLET | Freq: Every day | ORAL | Status: DC
  Administered 2024-03-11: 10 mg via ORAL
  Filled 2024-03-11: qty 2

## 2024-03-11 MED ORDER — METHOCARBAMOL 500 MG PO TABS
500.0000 mg | ORAL_TABLET | Freq: Three times a day (TID) | ORAL | 0 refills | Status: AC | PRN
  Filled 2024-03-11: qty 30, 10d supply, fill #0

## 2024-03-11 MED ORDER — PSYLLIUM 95 % PO PACK
1.0000 | PACK | Freq: Two times a day (BID) | ORAL | 0 refills | Status: AC
  Filled 2024-03-11: qty 240, 120d supply, fill #0

## 2024-03-11 MED ORDER — SODIUM CHLORIDE 0.9% FLUSH
INTRAVENOUS | 0 refills | Status: DC
  Filled 2024-03-11: qty 150, 30d supply, fill #0

## 2024-03-11 MED ORDER — ASPIRIN 81 MG PO TBEC
81.0000 mg | DELAYED_RELEASE_TABLET | Freq: Every day | ORAL | 12 refills | Status: AC
  Filled 2024-03-11: qty 30, 30d supply, fill #0

## 2024-03-11 MED ORDER — FLUCONAZOLE 150 MG PO TABS
150.0000 mg | ORAL_TABLET | Freq: Once | ORAL | 0 refills | Status: AC | PRN
  Filled 2024-03-11: qty 1, 1d supply, fill #0

## 2024-03-11 MED ORDER — ACIDOPHILUS PO CAPS
1.0000 | ORAL_CAPSULE | Freq: Every day | ORAL | 0 refills | Status: AC
  Filled 2024-03-11: qty 30, 30d supply, fill #0

## 2024-03-11 MED ORDER — SODIUM CHLORIDE 0.9% FLUSH
5.0000 mL | Freq: Three times a day (TID) | INTRAVENOUS | 0 refills | Status: DC
  Filled 2024-03-11: qty 450, 30d supply, fill #0

## 2024-03-11 MED ORDER — LISINOPRIL 5 MG PO TABS: 5.0000 mg | ORAL_TABLET | Freq: Every day | ORAL | Status: DC

## 2024-03-11 MED ORDER — METOPROLOL SUCCINATE ER 25 MG PO TB24
25.0000 mg | ORAL_TABLET | Freq: Every day | ORAL | Status: DC
  Administered 2024-03-11: 25 mg via ORAL
  Filled 2024-03-11: qty 1

## 2024-03-11 MED ORDER — AMOXICILLIN-POT CLAVULANATE 875-125 MG PO TABS
1.0000 | ORAL_TABLET | Freq: Two times a day (BID) | ORAL | 0 refills | Status: AC
  Filled 2024-03-11: qty 60, 30d supply, fill #0

## 2024-03-11 MED ORDER — ALPRAZOLAM 0.25 MG PO TABS
0.2500 mg | ORAL_TABLET | Freq: Every day | ORAL | 0 refills | Status: AC
  Filled 2024-03-11: qty 30, 30d supply, fill #0

## 2024-03-11 MED ORDER — BISACODYL 10 MG RE SUPP
10.0000 mg | Freq: Every day | RECTAL | 0 refills | Status: AC | PRN
  Filled 2024-03-11: qty 12, 12d supply, fill #0

## 2024-03-11 MED ORDER — SODIUM CHLORIDE 0.9% FLUSH
10.0000 mL | Freq: Two times a day (BID) | INTRAVENOUS | 0 refills | Status: DC
  Filled 2024-03-11: qty 300, 4d supply, fill #0

## 2024-03-11 MED ORDER — LISINOPRIL 10 MG PO TABS
10.0000 mg | ORAL_TABLET | Freq: Every day | ORAL | 0 refills | Status: DC
  Filled 2024-03-11: qty 30, 30d supply, fill #0

## 2024-03-11 MED ORDER — DIPHENOXYLATE-ATROPINE 2.5-0.025 MG PO TABS
2.0000 | ORAL_TABLET | Freq: Four times a day (QID) | ORAL | 0 refills | Status: AC | PRN
  Filled 2024-03-11: qty 30, 4d supply, fill #0

## 2024-03-11 MED ORDER — PANTOPRAZOLE SODIUM 40 MG PO TBEC
40.0000 mg | DELAYED_RELEASE_TABLET | Freq: Every day | ORAL | 0 refills | Status: DC
  Filled 2024-03-11: qty 30, 30d supply, fill #0

## 2024-03-11 MED ORDER — AMOXICILLIN-POT CLAVULANATE 875-125 MG PO TABS
1.0000 | ORAL_TABLET | Freq: Two times a day (BID) | ORAL | Status: DC
Start: 2024-03-11 — End: 2024-03-12
  Administered 2024-03-11 – 2024-03-12 (×3): 1 via ORAL
  Filled 2024-03-11 (×3): qty 1

## 2024-03-11 NOTE — Progress Notes (Signed)
 Occupational Therapy Session Note  Patient Details  Name: Kristine [name] MRN: [id number] Date of Birth: 1956-03-12  Today's Date: 03/11/2024 OT Individual Time: 1020-1115 OT Individual Time Calculation (min): 55 min  Today's Date: 03/11/2024 OT Individual Time: 1415-1500 OT Individual Time Calculation (min): 45 min   Short Term Goals: Week 1:  OT Short Term Goal 1 (Week 1): STGs = LTGs   Skilled Therapeutic Interventions/Progress Updates:    1st session 1:1 Pt received in the recliner. Discussed d/c, retry into the community, energy conservation, activity tolerance. Pt ambulated from room to the gift shop and outdoors. Pt wanted to the push the w/c along the way. Pt discussed fatigue with things as well as wanting to use a cane she had at home. Pt able to navigate around the hospital with one seat break and prolonged standing breaks mod I. Pt returned to the unit and left with PT.   2nd session 1:1 Pt received I the recliner. Revisted the conversation about her wanting to use a cane at home v rollator. Pt trialed use of cane throughout the hospital, ambulating to the [place] tower and then back outside the main entrance and then return to the room with one seated break. Continued to discussed plan for IADLs and attendance at grandchildren's activities. Pt returned to the room and left in the chair in prep for wound care training for her husband.   Therapy Documentation Precautions:  Precautions Precautions: Fall Recall of Precautions/Restrictions: Intact Precaution/Restrictions Comments: PICC; TPN, drains Restrictions Weight Bearing Restrictions Per Provider Order: No    Pain: 1st session: No c/o pain 2nd session: No c/o pain    Therapy/Group: Individual Therapy  [name] [facility] 03/11/2024, 7:43 PM

## 2024-03-11 NOTE — Plan of Care (Signed)
  Problem: RH Balance Goal: LTG Patient will maintain dynamic standing with ADLs (OT) Description: LTG:  Patient will maintain dynamic standing balance with assist during activities of daily living (OT)  Outcome: Completed/Met   Problem: RH Dressing Goal: LTG Patient will perform upper body dressing (OT) Description: LTG Patient will perform upper body dressing with assist, with/without cues (OT). Outcome: Completed/Met Goal: LTG Patient will perform lower body dressing w/assist (OT) Description: LTG: Patient will perform lower body dressing with assist, with/without cues in positioning using equipment (OT) Outcome: Completed/Met   Problem: RH Simple Meal Prep Goal: LTG Patient will perform simple meal prep w/assist (OT) Description: LTG: Patient will perform simple meal prep with assistance, with/without cues (OT). Outcome: Completed/Met   Problem: RH Light Housekeeping Goal: LTG Patient will perform light housekeeping w/assist (OT) Description: LTG: Patient will perform light housekeeping with assistance, with/without cues (OT). Outcome: Completed/Met   Problem: RH Pre-functional/Other (Specify) Goal: RH LTG OT (Specify) 1 Description: RH LTG OT (Specify) 1 Outcome: Completed/Met

## 2024-03-11 NOTE — Progress Notes (Signed)
 Staples removed per order. Patient tolerated well. Dressing in place.    Nat Hacker LPN

## 2024-03-11 NOTE — Progress Notes (Signed)
 Physical Therapy Session Note  Patient Details  Name: Kristine [name] MRN: [id number] Date of Birth: Dec 18, 1955  Today's Date: 03/11/2024 PT Individual Time: 1110-1200, 0900-1000 PT Individual Time Calculation (min): 50 min, 60 min  and Today's Date: 03/11/2024 PT Missed Time: 10 Minutes Missed Time Reason: Other (Comment) (Delay in care due to pt being with OT)  Short Term Goals: Week 1:  PT Short Term Goal 1 (Week 1): = LTGS  Skilled Therapeutic Interventions/Progress Updates:     Treatment 1: Pt supine in bed upon arrival. Denies pain and agreeable to therapy.  Session emphasized activity tolerance and functional strength/endurance during bed mobility, transfers, and ambulation. Pt performed L/R rolling and sit <> supine independently. Pt performed sit <> stand transfers throughout session with mod I. Pt performed short distance ambulatory transfer with mod I. Pt performed car transfer with mod I. Pt reached down to pick up slipper from floor with mod I. Pt ambulated ~350 ft without AD with mod I. Pt asc/desc 12-6 inch steps using single HR with mod I. Pt utilized UBE 5 min fwd/5 min bkwd on level 3 for UE strength and endurance. Pt seated in recliner at end of session. Based on pt's current functional level, she is appropriate to be mod I within her room - nursing notified.  Treatment 2:  Pt received from OT. Denies pain and agreeable to therapy.  Session emphasized activity tolerance and functional strength/endurance during ambulation and balance activity. Pt instructed in and performed TUG x 3 trials (9.26 sec avg - improved since eval). Pt ambulated ~250 ft without AD with mod I. Pt performed weighted ball toss at rebounder with 2.2# and 4.4# ball while standing on foam and in modified SLS (LE on dynadisc) at various distances. CGA/close supervision provided for safety. Pt required occasional seated rest breaks during activity due to fatigue. No LOB noted. Pt ambulated back to  room. All PT-related questions answered. Pt seated in recliner at end of session.  Pt missed 10 min treatment time - delay in care due to pt being with OT.  Therapy Documentation Precautions:  Precautions Precautions: Fall Recall of Precautions/Restrictions: Intact Precaution/Restrictions Comments: PICC; TPN, drains Restrictions Weight Bearing Restrictions Per Provider Order: No  Therapy/Group: Individual Therapy  [name], DPT 03/11/2024, 10:04 AM

## 2024-03-11 NOTE — Progress Notes (Signed)
 Physical Therapy Discharge Summary  Patient Details  Name: Kristine [name] MRN: [id number] Date of Birth: Oct 21, 1955  Date of Discharge from PT service:March 11, 2024  Patient has met 6 of 6 long term goals due to improved activity tolerance, improved balance, increased strength, decreased pain, and ability to compensate for deficits.  Patient to discharge at an ambulatory level Modified Independent.   Patient's care partner is independent to provide the necessary physical assistance at discharge.  Recommendation:  Patient will benefit from ongoing skilled PT services in outpatient setting to continue to advance safe functional mobility, address ongoing impairments in generalized strength and endurance, activity tolerance, balance, pain management and minimize fall risk.  Equipment: No equipment provided  Reasons for discharge: treatment goals met and discharge from hospital  Patient/family agrees with progress made and goals achieved: Yes  PT Discharge Precautions/Restrictions Precautions Precautions: Fall Recall of Precautions/Restrictions: Intact Precaution/Restrictions Comments: PICC; TPN, drains Restrictions Weight Bearing Restrictions Per Provider Order: No Pain Interference Pain Interference Pain Effect on Sleep: 2. Occasionally Pain Interference with Therapy Activities: 1. Rarely or not at all Pain Interference with Day-to-Day Activities: 1. Rarely or not at all Vision/Perception  Vision - History Ability to See in Adequate Light: 0 Adequate Perception Perception: Within Functional Limits Praxis Praxis: WFL  Cognition Overall Cognitive Status: Within Functional Limits for tasks assessed Orientation Level: Oriented X4 Memory: Appears intact Awareness: Appears intact Problem Solving: Appears intact Safety/Judgment: Appears intact Sensation Sensation Light Touch: Appears Intact Proprioception: Appears Intact Coordination Gross Motor Movements are Fluid  and Coordinated: Yes Fine Motor Movements are Fluid and Coordinated: Yes Motor  Motor Motor: Within Functional Limits  Mobility Bed Mobility Bed Mobility: Rolling Right;Rolling Left;Supine to Sit;Sit to Supine Rolling Right: Independent Rolling Left: Independent Supine to Sit: Independent Sit to Supine: Independent Transfers Transfers: Sit to Stand;Stand to Sit Sit to Stand: Independent with assistive device Stand to Sit: Independent with assistive device Stand Pivot Transfers: Independent with assistive device Locomotion  Gait Ambulation: Yes Gait Assistance: Independent with assistive device Gait Distance (Feet): 350 Feet Assistive device: None Gait Gait: Yes Gait Pattern: Impaired Gait Pattern: Step-through pattern;Antalgic;Trunk flexed Gait velocity: WFL Stairs / Additional Locomotion Stairs: Yes Stairs Assistance: Independent with assistive device Stair Management Technique: One rail Right Number of Stairs: 12 Height of Stairs: 6 Ramp: Independent with assistive device Curb: Independent with assistive device Pick up small object from the floor assist level: Independent Wheelchair Mobility Wheelchair Mobility: No  Trunk/Postural Assessment  Cervical Assessment Cervical Assessment: Within Functional Limits Thoracic Assessment Thoracic Assessment: Within Functional Limits Lumbar Assessment Lumbar Assessment: Within Functional Limits Postural Control Postural Control: Within Functional Limits  Balance Balance Balance Assessed: Yes Standardized Balance Assessment Standardized Balance Assessment: Timed Up and Go Test Timed Up and Go Test TUG: Normal TUG Normal TUG (seconds): 9.26 (Avg 3 trials) Static Sitting Balance Static Sitting - Balance Support: Feet supported Static Sitting - Level of Assistance: 7: Independent Dynamic Sitting Balance Dynamic Sitting - Balance Support: Feet supported Dynamic Sitting - Level of Assistance: 7: Independent Static  Standing Balance Static Standing - Balance Support: No upper extremity supported Static Standing - Level of Assistance: 7: Independent Dynamic Standing Balance Dynamic Standing - Balance Support: No upper extremity supported Dynamic Standing - Level of Assistance: 7: Independent Extremity Assessment  RLE Assessment RLE Assessment: Within Functional Limits General Strength Comments: grossly WFL, reports OA worse in R knee - increased pain at times and occasional buckle; decreased muscular endurance LLE Assessment LLE Assessment: Within Functional  Limits General Strength Comments: grossly WFL; knee OA Bilaterally (more in R) with pain at times, decreased muscular endurance   [name], DPT 03/11/2024, 10:01 AM

## 2024-03-11 NOTE — Progress Notes (Signed)
 Inpatient Rehabilitation Care Coordinator Discharge Note   Patient Details  Name: Kristine [name] MRN: [id number] Date of Birth: [birth year]   Discharge location: HOME WITH HUSBAND WHO CAN ASSIST WORKS FROM HOME PART TIME  Length of Stay: 5 DAYS  Discharge activity level: MOD/I LEVEL  Home/community participation: ACTIVE  Patient response un:Yzjouy Literacy - How often do you need to have someone help you when you read instructions, pamphlets, or other written material from your doctor or pharmacy?: Never  Patient response un:Dnrpjo Isolation - How often do you feel lonely or isolated from those around you?: Never  Services provided included: MD, RD, PT, OT, RN, CM, Pharmacy, Neuropsych, SW  Financial Services:  Financial Services Utilized: Medicare    Choices offered to/list presented to: PT  Follow-up services arranged:  Outpatient    Outpatient Servicies: [facility] ON [address]-OPPT WILL CALL TO SET UP FOLLOW UP APPOINTMENTS    NO DME NEEDS  Patient response to transportation need: Is the patient able to respond to transportation needs?: Yes In the past 12 months, has lack of transportation kept you from medical appointments or from getting medications?: No In the past 12 months, has lack of transportation kept you from meetings, work, or from getting things needed for daily living?: No   Patient/Family verbalized understanding of follow-up arrangements:  Yes  Individual responsible for coordination of the follow-up plan: SELF AND [name]-HUSBAND [phone]  Confirmed correct DME delivered: Raymonde Asberry MATSU 03/11/2024    Comments (or additional information):PT DID WELL AND MEDICAL ISSUES ADDRESSED. 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 NO FAMILY EDUCATION NEEDED PER THERAPY TEAM  Summary of Stay    Date/Time Discharge Planning CSW  03/09/24 1458 HOme with husband who works part time from home. Pt is doing well  but need answers regarding length of IV antibiotics and if going home with drain. Await medical answers RGD       Brinae Woods, Asberry MATSU

## 2024-03-11 NOTE — Progress Notes (Signed)
 PROGRESS NOTE   Subjective/Complaints: Reports mild soreness from IR procedure yesterday. Overall feeling well.  LBM yesterday  ROS: Patient denies fever, chills, nausea, vomiting,  shortness of breath or chest pain, headache, or mood change. No new motor or sensory changes.   Loose Bms- improved  Objective:   IR INJECT INDWELLING DRAINAGE CATHETER Result Date: 03/10/2024 INDICATION: Pelvic abscess EXAM: Fluoroscopic injection of the left trans gluteal pelvic abscess drain MEDICATIONS: The patient is currently admitted to the hospital and receiving intravenous antibiotics. The antibiotics were administered within an appropriate time frame prior to the initiation of the procedure. ANESTHESIA/SEDATION: Total intra-service moderate Sedation Time: None. The patient's level of consciousness and vital signs were monitored continuously by radiology nursing throughout the procedure under my direct supervision. COMPLICATIONS: None immediate. PROCEDURE: Informed written consent was obtained from the patient after a thorough discussion of the procedural risks, benefits and alternatives. All questions were addressed. Maximal Sterile Barrier Technique was utilized including caps, mask, sterile gowns, sterile gloves, sterile drape, hand hygiene and skin antiseptic. A timeout was performed prior to the initiation of the procedure. Previous imaging reviewed. Drain injection performed under fluoroscopy. Images obtained. Stable drain catheter position. There is a smaller collapsed but residual abscess cavity in the cul-de-sac correlating with the recent CT 2 days ago. No visualized fistula. Drain was flushed and reconnected to external suction bulb. Mildly exudative fluid within the drain bulb. IMPRESSION: 1. Smaller residual abscess cavity in the cul-de-sac. 2. No visualized fistula. 3. Drain flushed and reconnected to external suction bulb. PLAN: Recommend  continued daily flushing and external suction bulb. Patient can be scheduled for outpatient drain follow-up in the IR clinic in 10-14 days. Electronically Signed   By: CHRISTELLA.  Shick M.D.   On: 03/10/2024 14:59   No results for input(s): WBC, HGB, HCT, PLT in the last 72 hours.  No results for input(s): NA, K, CL, CO2, GLUCOSE, BUN, CREATININE, CALCIUM in the last 72 hours.   Intake/Output Summary (Last 24 hours) at 03/11/2024 1147 Last data filed at 03/11/2024 0815 Gross per 24 hour  Intake 490 ml  Output 0 ml  Net 490 ml        Physical Exam: Vital Signs Blood pressure (!) 130/53, pulse 65, temperature 98.4 F (36.9 C), temperature source Oral, resp. rate 18, height 5' 5.5 (1.664 m), weight 89.5 kg, SpO2 95%.  Constitutional: No apparent distress. Appropriate appearance for age.  Working with therapy in her room HENT: [place], AT Eyes: PERRLA. EOMI. Visual fields grossly intact.  Cardiovascular: 1+ b/l LE Edema.   Respiratory: Non-labored breathing Abdomen: ND, NT     Skin: C/D/I. No apparent lesions. + Midline abdominal incision -covered in dry ABD pad + Right flank/buttocks drain.  C-D-I, small amount fluid in tubing and drain + Right PICC line, CDI + Left midline, CDI   MSK:      No apparent deformity.     Neurologic exam: Alert and awake, follows commands CN: 2-12 grossly intact.   Moving all 4 to gravity and resistance  Prior exam:      Strength:  RUE: 5/5 SA, 5/5 EF, 5/5 EE, 5/5 WE, 5/5 FF, 5/5 FA                  LUE: 5/5 SA, 5/5 EF, 5/5 EE, 5/5 WE, 5/5 FF, 5/5 FA                  RLE: 4/5 HF, 4/5 KE, 5/5 DF, 5/5 EHL, 5/5 PF                  LLE:  5/5 HF, 5/5 KE, 5/5 DF, 5/5 EHL, 5/5 PF      Assessment/Plan: 1. Functional deficits which require 3+ hours per day of interdisciplinary therapy in a comprehensive inpatient rehab setting. Physiatrist is providing close team supervision and 24 hour management of active medical  problems listed below. Physiatrist and rehab team continue to assess barriers to discharge/monitor patient progress toward functional and medical goals  Care Tool:  Bathing    Body parts bathed by patient: Right arm, Left arm, Abdomen, Chest, Front perineal area, Buttocks, Right upper leg, Left upper leg, Right lower leg, Left lower leg, Face         Bathing assist Assist Level: Independent with assistive device     Upper Body Dressing/Undressing Upper body dressing   What is the patient wearing?: Pull over shirt, Hospital gown only    Upper body assist Assist Level: Independent with assistive device    Lower Body Dressing/Undressing Lower body dressing      What is the patient wearing?: Underwear/pull up, Pants     Lower body assist Assist for lower body dressing: Independent with assitive device     Toileting Toileting    Toileting assist Assist for toileting: Independent with assistive device     Transfers Chair/bed transfer  Transfers assist     Chair/bed transfer assist level: Independent with assistive device Chair/bed transfer assistive device: Armrests   Locomotion Ambulation   Ambulation assist      Assist level: Independent with assistive device Assistive device: No Device Max distance: 350   Walk 10 feet activity   Assist     Assist level: Independent Assistive device: No Device   Walk 50 feet activity   Assist    Assist level: Independent with assistive device Assistive device: No Device    Walk 150 feet activity   Assist    Assist level: Independent with assistive device Assistive device: No Device    Walk 10 feet on uneven surface  activity   Assist     Assist level: Independent with assistive device     Wheelchair     Assist Is the patient using a wheelchair?: No             Wheelchair 50 feet with 2 turns activity    Assist            Wheelchair 150 feet activity     Assist           Blood pressure (!) 130/53, pulse 65, temperature 98.4 F (36.9 C), temperature source Oral, resp. rate 18, height 5' 5.5 (1.664 m), weight 89.5 kg, SpO2 95%.  Medical Problem List and Plan: 1. Functional deficits secondary to debility from small bowel obstruction status post ex lap 10-19 Dr. Vernetta              -patient may not shower             -ELOS/Goals: 10 to 12 days, supervision PT/OT             -  Continue CIR  -DC scheduled for tomorrow - continue with current plan  -Discussed with surgery Ozell Shaper- ok remove staples     2.  Antithrombotics/Hx DVTs d/t Factor V Leiden: -DVT/anticoagulation:  Pharmaceutical: Lovenox 40 mg daily             -antiplatelet therapy: ASA 81 mg daily --resumed by hospitalist just prior to rehab admission             - Negative bilateral lower extremity duplex 10-21   3. Pain Management: Has not been using PRNs much.  Consolidate to Tylenol 1000 mg every 6 hours as needed and tramadol 50 to 100 mg every 6 hours as needed for moderate to severe pain.  Robaxin 500 mg as needed.  -10/29-30 pain overall under control   4. Mood/Behavior/Sleep: Xanax  0.25 nightly, buproprion 300 mg daily             -antipsychotic agents: None              - Trazodone PRN added   5. Neuropsych/cognition: This patient is capable of making decisions on her own behalf. 6. Skin/Wound Care:               - Midline abdominal incision, change dressings daily and as needed              -  advised patient to use pillows and try to sleep on her right side to avoid putting pressure on plastic piece adhered to her back.              - Destin ointment to buttocks 4 times daily to prevent MASD  -Wound care orders placed   7. Fluids/Electrolytes/Nutrition:  AM labs pending.  Periodic hypomagnesemia/hypokalemia.   8.  Moderate protein calorie malnutrition.              - Dietary consult              - Soft diet, probiotics nightly   9.  Stubborn ileus/diarrhea.               - Patient endorses passing whole pills, Lasix  and K Dur.  Dietary order placed and nursing informed to crush pills when possible to assist in absorption.              - Psyllium twice daily              - Per surgery,  avoid Lomotil, imodium- Looked in to this further, surgery was ok with lomotil trial on 10/19 and she was using this intermittently during acute hospitalization. I think its ok for her to continue this PRN              - Monitor fluids closely, may need maintenance IV fluids   10.  Peripheral edema/anasarca             - On Lasix  20 mg daily; 2x weeks and as needed at home             - Potassium supplementation while on Lasix , 20 mill equivalents daily.   11.  Right pelvic abscess. JP Drain placed by IR 10-24             - Cultures pending              - Will need IR to follow-up on drain care, timeline for removal  -IR notified, seen today, appreciate assistance. Will need to contact IR APP or on call  IR MD prior to patient d/c   -CT scan completed ordered by IR, fluid collection improved, will contact IR closer to DC as above  -Augmentin for 1 month see #12   12.  Postop abdominal wall infection.             - Continue Zosyn (started 10/20; will need to follow up with general surgery on duration)  -Called surgery 10/28- Burnard Ohara- OK to DC zosyn today  -Dicussed with Dr. Fleeta Rothman ID, will start augmentin for 1 month   13.  History of diabetes.  Hemoglobin A1c 5.8 on 12-2023.  Metformin  XR 1000 mg daily -- resumed by hospitalist just prior to inpatient rehab admission.              - Check blood sugars twice daily to start  -10/27-30 stable this am, monitor   CBG (last 3)  Recent Labs    03/09/24 1642 03/10/24 0558 03/10/24 1632  GLUCAP 117* 131* 109*      14.  Hypertension.  Switched from Toprol -XL to Coreg 25 mg twice daily.  Lasix  as above.    03/11/2024    6:10 AM 03/10/2024    8:50 PM 03/10/2024    4:00 PM  Vitals with BMI  Systolic 130  118 151  Diastolic 53 52 65  Pulse 65 75 71  Start amlodipine 5mg  daily for BP control  10/28 monitor response to medication change  10/29 BP control improved, continue to monitor   10/30 intermittantly elevated but overall controlled, continue current regimen 15.  Allergies.  Has as needed Zyrtec, Flonase .       LOS: 4 days A FACE TO FACE EVALUATION WAS PERFORMED  Murray Collier 03/11/2024, 11:47 AM

## 2024-03-11 NOTE — Plan of Care (Signed)
  Problem: RH Balance Goal: LTG Patient will maintain dynamic standing balance (PT) Description: LTG:  Patient will maintain dynamic standing balance with assistance during mobility activities (PT) Outcome: Completed/Met   Problem: RH Car Transfers Goal: LTG Patient will perform car transfers with assist (PT) Description: LTG: Patient will perform car transfers with assistance (PT). Outcome: Completed/Met   Problem: RH Ambulation Goal: LTG Patient will ambulate in controlled environment (PT) Description: LTG: Patient will ambulate in a controlled environment, # of feet with assistance (PT). Outcome: Completed/Met Goal: LTG Patient will ambulate in home environment (PT) Description: LTG: Patient will ambulate in home environment, # of feet with assistance (PT). Outcome: Completed/Met Goal: LTG Patient will ambulate in community environment (PT) Description: LTG: Patient will ambulate in community environment, # of feet with assistance (PT). Outcome: Completed/Met   Problem: RH Stairs Goal: LTG Patient will ambulate up and down stairs w/assist (PT) Description: LTG: Patient will ambulate up and down # of stairs with assistance (PT) Outcome: Completed/Met

## 2024-03-11 NOTE — Progress Notes (Incomplete)
 Inpatient Rehabilitation Discharge Medication Review by a Pharmacist  A complete drug regimen review was completed for this patient to identify any potential clinically significant medication issues.  High Risk Drug Classes Is patient taking? Indication by Medication  Antipsychotic No   Anticoagulant Yes Lovenox - VTE prophylaxis  Antibiotic Yes  Augmentin x 1 month from 10/30 per ID recommendation - R pelvic abscess   Opioid    Antiplatelet Yes Aspirin - antiplatelet  Hypoglycemics/insulin Yes Metformin  - DM2  Vasoactive Medication Yes Carvedilol - HTN  Chemotherapy No   Other Yes Alprazolam  - anxiety Probiotic, Vitamin D3, KCl - supplements Bupropion  - mood Pantoprazole- reflux  Esomeprazole - reflux  Simvastatin  - cholesterol    Type of Medication Issue Identified Description of Issue Recommendation(s)  Drug Interaction(s) (clinically significant)     Duplicate Therapy     Allergy     No Medication Administration End Date     Incorrect Dose     Additional Drug Therapy Needed     Significant med changes from prior encounter (inform family/care partners about these prior to discharge). Taking Toprol  XL PTA - now on Carvedilol.   PTA Penlac solution not resumed (nonformulary, continue to hold while in CIR). PTA esomeprazole  substituted with pantoprazole per formulary. PTA cyclobenzaprine  not resumed as currently receiving methocarbamol. Communicate relevant medication changes to patient/family members at discharge from CIR.   Restart or discontinue PTA meds not resumed in CIR at discharge if clinically indicated.   Other       Clinically significant medication issues were identified that warrant physician communication and completion of prescribed/recommended actions by midnight of the next day:  No  Name of provider notified for urgent issues identified: N/A  Provider Method of Notification: N/A  Pharmacist comments: N/A  Time spent performing this drug regimen  review (minutes):  20

## 2024-03-11 NOTE — Progress Notes (Signed)
 PROGRESS NOTE   Subjective/Complaints: Reports mild soreness from IR procedure yesterday. Overall feeling well.  LBM yesterday  ROS: Patient denies fever, chills, nausea, vomiting,  shortness of breath or chest pain, headache, or mood change. No new motor or sensory changes.   Loose Bms- improved  Objective:   IR INJECT INDWELLING DRAINAGE CATHETER Result Date: 03/10/2024 INDICATION: Pelvic abscess EXAM: Fluoroscopic injection of the left trans gluteal pelvic abscess drain MEDICATIONS: The patient is currently admitted to the hospital and receiving intravenous antibiotics. The antibiotics were administered within an appropriate time frame prior to the initiation of the procedure. ANESTHESIA/SEDATION: Total intra-service moderate Sedation Time: None. The patient's level of consciousness and vital signs were monitored continuously by radiology nursing throughout the procedure under my direct supervision. COMPLICATIONS: None immediate. PROCEDURE: Informed written consent was obtained from the patient after a thorough discussion of the procedural risks, benefits and alternatives. All questions were addressed. Maximal Sterile Barrier Technique was utilized including caps, mask, sterile gowns, sterile gloves, sterile drape, hand hygiene and skin antiseptic. A timeout was performed prior to the initiation of the procedure. Previous imaging reviewed. Drain injection performed under fluoroscopy. Images obtained. Stable drain catheter position. There is a smaller collapsed but residual abscess cavity in the cul-de-sac correlating with the recent CT 2 days ago. No visualized fistula. Drain was flushed and reconnected to external suction bulb. Mildly exudative fluid within the drain bulb. IMPRESSION: 1. Smaller residual abscess cavity in the cul-de-sac. 2. No visualized fistula. 3. Drain flushed and reconnected to external suction bulb. PLAN: Recommend  continued daily flushing and external suction bulb. Patient can be scheduled for outpatient drain follow-up in the IR clinic in 10-14 days. Electronically Signed   By: CHRISTELLA.  Shick M.D.   On: 03/10/2024 14:59   No results for input(s): WBC, HGB, HCT, PLT in the last 72 hours.  No results for input(s): NA, K, CL, CO2, GLUCOSE, BUN, CREATININE, CALCIUM in the last 72 hours.   Intake/Output Summary (Last 24 hours) at 03/11/2024 1229 Last data filed at 03/11/2024 0815 Gross per 24 hour  Intake 490 ml  Output 0 ml  Net 490 ml        Physical Exam: Vital Signs Blood pressure (!) 130/53, pulse 65, temperature 98.4 F (36.9 C), temperature source Oral, resp. rate 18, height 5' 5.5 (1.664 m), weight 89.5 kg, SpO2 95%.  Constitutional: No apparent distress. Appropriate appearance for age.  Working with therapy in her room HENT: [place], AT Eyes: PERRLA. EOMI. Visual fields grossly intact.  Cardiovascular: 1+ b/l LE Edema.   Respiratory: Non-labored breathing Abdomen: ND, NT     Skin: C/D/I. No apparent lesions. + Midline abdominal incision -covered in dry ABD pad + Right flank/buttocks drain.  C-D-I, small amount fluid in tubing and drain + Right PICC line, CDI + Left midline, CDI   MSK:      No apparent deformity.     Neurologic exam: Alert and awake, follows commands CN: 2-12 grossly intact.   Moving all 4 to gravity and resistance  Prior exam:      Strength:  RUE: 5/5 SA, 5/5 EF, 5/5 EE, 5/5 WE, 5/5 FF, 5/5 FA                  LUE: 5/5 SA, 5/5 EF, 5/5 EE, 5/5 WE, 5/5 FF, 5/5 FA                  RLE: 4/5 HF, 4/5 KE, 5/5 DF, 5/5 EHL, 5/5 PF                  LLE:  5/5 HF, 5/5 KE, 5/5 DF, 5/5 EHL, 5/5 PF      Assessment/Plan: 1. Functional deficits which require 3+ hours per day of interdisciplinary therapy in a comprehensive inpatient rehab setting. Physiatrist is providing close team supervision and 24 hour management of active medical  problems listed below. Physiatrist and rehab team continue to assess barriers to discharge/monitor patient progress toward functional and medical goals  Care Tool:  Bathing    Body parts bathed by patient: Right arm, Left arm, Abdomen, Chest, Front perineal area, Buttocks, Right upper leg, Left upper leg, Right lower leg, Left lower leg, Face         Bathing assist Assist Level: Independent with assistive device     Upper Body Dressing/Undressing Upper body dressing   What is the patient wearing?: Pull over shirt, Hospital gown only    Upper body assist Assist Level: Independent with assistive device    Lower Body Dressing/Undressing Lower body dressing      What is the patient wearing?: Underwear/pull up, Pants     Lower body assist Assist for lower body dressing: Independent with assitive device     Toileting Toileting    Toileting assist Assist for toileting: Independent with assistive device     Transfers Chair/bed transfer  Transfers assist     Chair/bed transfer assist level: Independent with assistive device Chair/bed transfer assistive device: Armrests   Locomotion Ambulation   Ambulation assist      Assist level: Independent with assistive device Assistive device: No Device Max distance: 350   Walk 10 feet activity   Assist     Assist level: Independent Assistive device: No Device   Walk 50 feet activity   Assist    Assist level: Independent with assistive device Assistive device: No Device    Walk 150 feet activity   Assist    Assist level: Independent with assistive device Assistive device: No Device    Walk 10 feet on uneven surface  activity   Assist     Assist level: Independent with assistive device     Wheelchair     Assist Is the patient using a wheelchair?: No             Wheelchair 50 feet with 2 turns activity    Assist            Wheelchair 150 feet activity     Assist           Blood pressure (!) 130/53, pulse 65, temperature 98.4 F (36.9 C), temperature source Oral, resp. rate 18, height 5' 5.5 (1.664 m), weight 89.5 kg, SpO2 95%.  Medical Problem List and Plan: 1. Functional deficits secondary to debility from small bowel obstruction status post ex lap 10-19 Dr. Vernetta              -patient may not shower             -ELOS/Goals: 10 to 12 days, supervision PT/OT             -  Continue CIR  -DC scheduled for tomorrow - continue with current plan  -Discussed with surgery Ozell Shaper- ok remove staples     2.  Antithrombotics/Hx DVTs d/t Factor V Leiden: -DVT/anticoagulation:  Pharmaceutical: Lovenox 40 mg daily             -antiplatelet therapy: ASA 81 mg daily --resumed by hospitalist just prior to rehab admission             - Negative bilateral lower extremity duplex 10-21   3. Pain Management: Has not been using PRNs much.  Consolidate to Tylenol 1000 mg every 6 hours as needed and tramadol 50 to 100 mg every 6 hours as needed for moderate to severe pain.  Robaxin 500 mg as needed.  -10/29-30 pain overall under control   4. Mood/Behavior/Sleep: Xanax  0.25 nightly, buproprion 300 mg daily             -antipsychotic agents: None              - Trazodone PRN added   5. Neuropsych/cognition: This patient is capable of making decisions on her own behalf. 6. Skin/Wound Care:               - Midline abdominal incision, change dressings daily and as needed              -  advised patient to use pillows and try to sleep on her right side to avoid putting pressure on plastic piece adhered to her back.              - Destin ointment to buttocks 4 times daily to prevent MASD  -Wound care orders placed   7. Fluids/Electrolytes/Nutrition:  AM labs pending.  Periodic hypomagnesemia/hypokalemia.   8.  Moderate protein calorie malnutrition.              - Dietary consult              - Soft diet, probiotics nightly   9.  Stubborn ileus/diarrhea.               - Patient endorses passing whole pills, Lasix  and K Dur.  Dietary order placed and nursing informed to crush pills when possible to assist in absorption.              - Psyllium twice daily              - Per surgery,  avoid Lomotil, imodium- Looked in to this further, surgery was ok with lomotil trial on 10/19 and she was using this intermittently during acute hospitalization. I think its ok for her to continue this PRN              - Monitor fluids closely, may need maintenance IV fluids   10.  Peripheral edema/anasarca             - On Lasix  20 mg daily; 2x weeks and as needed at home             - Potassium supplementation while on Lasix , 20 mill equivalents daily.   11.  Right pelvic abscess. JP Drain placed by IR 10-24             - Cultures pending              - Will need IR to follow-up on drain care, timeline for removal  -IR notified, seen today, appreciate assistance. Will need to contact IR APP or on call  IR MD prior to patient d/c   -CT scan completed ordered by IR, fluid collection improved, will contact IR closer to DC as above  -Augmentin for 1 month see #12   12.  Postop abdominal wall infection.             - Continue Zosyn (started 10/20; will need to follow up with general surgery on duration)  -Called surgery 10/28- Burnard Ohara- OK to DC zosyn today  -Dicussed with Dr. Fleeta Rothman ID, will start augmentin for 1 month   13.  History of diabetes.  Hemoglobin A1c 5.8 on 12-2023.  Metformin  XR 1000 mg daily -- resumed by hospitalist just prior to inpatient rehab admission.              - Check blood sugars twice daily to start  -10/27-30 stable this am, monitor   CBG (last 3)  Recent Labs    03/09/24 1642 03/10/24 0558 03/10/24 1632  GLUCAP 117* 131* 109*      14.  Hypertension.  Switched from Toprol -XL to Coreg 25 mg twice daily.  Lasix  as above.    03/11/2024    6:10 AM 03/10/2024    8:50 PM 03/10/2024    4:00 PM  Vitals with BMI  Systolic 130  118 151  Diastolic 53 52 65  Pulse 65 75 71  Start amlodipine 5mg  daily for BP control  10/28 monitor response to medication change  10/29 BP control improved, continue to monitor   10/30 intermittantly elevated but overall controlled, continue current regimen 15.  Allergies.  Has as needed Zyrtec, Flonase .       LOS: 4 days A FACE TO FACE EVALUATION WAS PERFORMED  Murray Collier 03/11/2024, 12:29 PM

## 2024-03-12 ENCOUNTER — Other Ambulatory Visit: Payer: Self-pay | Admitting: Surgery

## 2024-03-12 ENCOUNTER — Other Ambulatory Visit (HOSPITAL_COMMUNITY): Payer: Self-pay

## 2024-03-12 DIAGNOSIS — K9189 Other postprocedural complications and disorders of digestive system: Secondary | ICD-10-CM

## 2024-03-12 DIAGNOSIS — R600 Localized edema: Secondary | ICD-10-CM

## 2024-03-12 DIAGNOSIS — K567 Ileus, unspecified: Secondary | ICD-10-CM

## 2024-03-12 DIAGNOSIS — N739 Female pelvic inflammatory disease, unspecified: Secondary | ICD-10-CM

## 2024-03-12 LAB — COMPREHENSIVE METABOLIC PANEL WITH GFR
ALT: 12 U/L (ref 0–44)
AST: 12 U/L — ABNORMAL LOW (ref 15–41)
Albumin: 2.4 g/dL — ABNORMAL LOW (ref 3.5–5.0)
Alkaline Phosphatase: 66 U/L (ref 38–126)
Anion gap: 11 (ref 5–15)
BUN: 7 mg/dL — ABNORMAL LOW (ref 8–23)
CO2: 24 mmol/L (ref 22–32)
Calcium: 8.5 mg/dL — ABNORMAL LOW (ref 8.9–10.3)
Chloride: 104 mmol/L (ref 98–111)
Creatinine, Ser: 0.62 mg/dL (ref 0.44–1.00)
GFR, Estimated: >60 mL/min (ref >60)
Glucose, Bld: 125 mg/dL — ABNORMAL HIGH (ref 70–99)
Potassium: 3.7 mmol/L (ref 3.5–5.1)
Sodium: 139 mmol/L (ref 135–145)
Total Bilirubin: 0.6 mg/dL (ref 0.0–1.2)
Total Protein: 5.1 g/dL — ABNORMAL LOW (ref 6.5–8.1)

## 2024-03-12 LAB — GLUCOSE, CAPILLARY: Glucose-Capillary: 129 mg/dL — ABNORMAL HIGH (ref 70–99)

## 2024-03-12 MED ORDER — SODIUM CHLORIDE FLUSH 0.9 % IV SOLN
INTRAVENOUS | 0 refills | Status: DC
  Filled 2024-03-12: qty 75, 15d supply, fill #0

## 2024-03-12 NOTE — Progress Notes (Signed)
 PROGRESS NOTE   Subjective/Complaints: LE edema improving Some mild discomfort lower abdomen- continue to monitor. Has not needed recent lomotil.   LBM yesterday  ROS: Patient denies fever, chills, vision changes, nausea, vomiting,  shortness of breath or chest pain, headache, or mood change. No new motor or sensory changes.    Objective:   No results found.  No results for input(s): WBC, HGB, HCT, PLT in the last 72 hours.  Recent Labs    03/12/24 0510  NA 139  K 3.7  CL 104  CO2 24  GLUCOSE 125*  BUN 7*  CREATININE 0.62  CALCIUM 8.5*     Intake/Output Summary (Last 24 hours) at 03/12/2024 1349 Last data filed at 03/12/2024 0500 Gross per 24 hour  Intake 5 ml  Output 0 ml  Net 5 ml        Physical Exam: Vital Signs Blood pressure (!) 153/57, pulse 66, temperature 98.2 F (36.8 C), resp. rate 18, height 5' 5.5 (1.664 m), weight 89.5 kg, SpO2 95%.  Constitutional: No apparent distress. Appropriate appearance for age.  HENT: [place], AT Eyes: PERRLA. EOMI. Visual fields grossly intact.  Cardiovascular: 1+ b/l LE Edema.  (Improved from prior) Respiratory: Non-labored breathing Abdomen: ND, NT     Skin: C/D/I. No apparent lesions. + Midline abdominal incision -covered in dry ABD pad + Right flank/buttocks drain.  C-D-I, small amount fluid in tubing and drain   MSK:      No apparent deformity.     Neurologic exam: Alert and awake, follows commands CN: 2-12 grossly intact.   Moving all 4 to gravity and resistance  Prior exam:      Strength:                RUE: 5/5 SA, 5/5 EF, 5/5 EE, 5/5 WE, 5/5 FF, 5/5 FA                  LUE: 5/5 SA, 5/5 EF, 5/5 EE, 5/5 WE, 5/5 FF, 5/5 FA                  RLE: 4/5 HF, 4/5 KE, 5/5 DF, 5/5 EHL, 5/5 PF                  LLE:  5/5 HF, 5/5 KE, 5/5 DF, 5/5 EHL, 5/5 PF      Assessment/Plan: 1. Functional deficits which require 3+ hours per day of  interdisciplinary therapy in a comprehensive inpatient rehab setting. Physiatrist is providing close team supervision and 24 hour management of active medical problems listed below. Physiatrist and rehab team continue to assess barriers to discharge/monitor patient progress toward functional and medical goals  Care Tool:  Bathing    Body parts bathed by patient: Right arm, Left arm, Abdomen, Chest, Front perineal area, Buttocks, Right upper leg, Left upper leg, Right lower leg, Left lower leg, Face         Bathing assist Assist Level: Independent with assistive device     Upper Body Dressing/Undressing Upper body dressing   What is the patient wearing?: Pull over shirt, Hospital gown only    Upper body assist Assist Level: Independent with  assistive device    Lower Body Dressing/Undressing Lower body dressing      What is the patient wearing?: Underwear/pull up, Pants     Lower body assist Assist for lower body dressing: Independent with assitive device     Toileting Toileting    Toileting assist Assist for toileting: Independent with assistive device     Transfers Chair/bed transfer  Transfers assist     Chair/bed transfer assist level: Independent with assistive device Chair/bed transfer assistive device: Armrests   Locomotion Ambulation   Ambulation assist      Assist level: Independent with assistive device Assistive device: No Device Max distance: 350   Walk 10 feet activity   Assist     Assist level: Independent Assistive device: No Device   Walk 50 feet activity   Assist    Assist level: Independent with assistive device Assistive device: No Device    Walk 150 feet activity   Assist    Assist level: Independent with assistive device Assistive device: No Device    Walk 10 feet on uneven surface  activity   Assist     Assist level: Independent with assistive device     Wheelchair     Assist Is the patient using  a wheelchair?: No             Wheelchair 50 feet with 2 turns activity    Assist            Wheelchair 150 feet activity     Assist          Blood pressure (!) 153/57, pulse 66, temperature 98.2 F (36.8 C), resp. rate 18, height 5' 5.5 (1.664 m), weight 89.5 kg, SpO2 95%.  Medical Problem List and Plan: 1. Functional deficits secondary to debility from small bowel obstruction status post ex lap 10-19 Dr. Vernetta              -patient may not shower             -ELOS/Goals: 10 to 12 days, supervision PT/OT             -Continue CIR  -DC scheduled for tomorrow - continue with current plan  -Discussed with surgery Ozell Shaper- ok remove staples     2.  Antithrombotics/Hx DVTs d/t Factor V Leiden: -DVT/anticoagulation:  Pharmaceutical: Lovenox 40 mg daily             -antiplatelet therapy: ASA 81 mg daily --resumed by hospitalist just prior to rehab admission             - Negative bilateral lower extremity duplex 10-21   3. Pain Management: Has not been using PRNs much.  Consolidate to Tylenol 1000 mg every 6 hours as needed and tramadol 50 to 100 mg every 6 hours as needed for moderate to severe pain.  Robaxin 500 mg as needed.  -10/29-30 pain overall under control   4. Mood/Behavior/Sleep: Xanax  0.25 nightly, buproprion 300 mg daily             -antipsychotic agents: None              - Trazodone PRN added   5. Neuropsych/cognition: This patient is capable of making decisions on her own behalf. 6. Skin/Wound Care:               - Midline abdominal incision, change dressings daily and as needed              -  advised patient to use pillows and try to sleep on her right side to avoid putting pressure on plastic piece adhered to her back.              - Destin ointment to buttocks 4 times daily to prevent MASD  -Wound care orders placed   7. Fluids/Electrolytes/Nutrition:  AM labs pending.  Periodic hypomagnesemia/hypokalemia.   8.  Moderate protein  calorie malnutrition.              - Dietary consult              - Soft diet, probiotics nightly   9.  Stubborn ileus/diarrhea.              - Patient endorses passing whole pills, Lasix  and K Dur.  Dietary order placed and nursing informed to crush pills when possible to assist in absorption.              - Psyllium twice daily              - Per surgery,  avoid Lomotil, imodium- Looked in to this further, surgery was ok with lomotil trial on 10/19 and she was using this intermittently during acute hospitalization. I think its ok for her to continue this PRN            -10/31 Bun Cr stable at 7. 0.62, has not used lomotil, LBM large on 10/29   10.  Peripheral edema/anasarca             - On Lasix  20 mg daily; 2x weeks and as needed at home             - Potassium supplementation while on Lasix , 20 mill equivalents daily.   11.  Right pelvic abscess. JP Drain placed by IR 10-24             - Cultures pending              - Will need IR to follow-up on drain care, timeline for removal  -IR notified, seen today, appreciate assistance. Will need to contact IR APP or on call IR MD prior to patient d/c   -CT scan completed ordered by IR, fluid collection improved, will contact IR closer to DC as above  -Augmentin for 1 month see #12  -F/o Outpatient with IR   12.  Postop abdominal wall infection.             - Continue Zosyn (started 10/20; will need to follow up with general surgery on duration)  -Called surgery 10/28- Burnard Ohara- OK to DC zosyn today  -Dicussed with Dr. Fleeta Rothman ID, will start augmentin for 1 month- appears to be tolerating this OK  -F/U surgery outpatient   13.  History of diabetes.  Hemoglobin A1c 5.8 on 12-2023.  Metformin  XR 1000 mg daily -- resumed by hospitalist just prior to inpatient rehab admission.              - Check blood sugars twice daily to start  -10/27-31 stable this am, monitor   CBG (last 3)  Recent Labs    03/10/24 1632 03/11/24 1633  03/12/24 0628  GLUCAP 109* 115* 129*      14.  Hypertension.  Switched from Toprol -XL to Coreg 25 mg twice daily.  Lasix  as above.    03/12/2024    5:24 AM 03/11/2024    7:54 PM 03/11/2024   12:57 PM  Vitals with BMI  Systolic 153 144 861  Diastolic 57 68 67  Pulse 66 69 71  Start amlodipine 5mg  daily for BP control  10/28 monitor response to medication change  10/29 BP control improved, continue to monitor   10/31 bp medication changed to lisinopril from amlodipine, pt would like to avoid possible edema with amlodipine. She has f/u with PCP 15.  Allergies.  Has as needed Zyrtec, Flonase .       LOS: 5 days A FACE TO FACE EVALUATION WAS PERFORMED  Murray Collier 03/12/2024, 1:49 PM

## 2024-03-12 NOTE — Progress Notes (Signed)
 Inpatient Rehabilitation Discharge Medication Review by a Pharmacist  A complete drug regimen review was completed for this patient to identify any potential clinically significant medication issues.  High Risk Drug Classes Is patient taking? Indication by Medication  Antipsychotic No   Anticoagulant Yes Lovenox - VTE prophylaxis  Antibiotic Yes  Augmentin x 1 month from 10/30 per ID recommendation - R pelvic abscess  Fluconazole - antifungal  Opioid    Antiplatelet Yes Aspirin - antiplatelet  Hypoglycemics/insulin Yes Metformin  - DM2  Vasoactive Medication Yes Lisinopril, toprol , lasix  - HTN  Chemotherapy No   Other Yes Alprazolam  - anxiety Probiotic, psyllium, Vitamin D3, KCl - supplements Bupropion  - mood Pantoprazole- reflux  Simvastatin  - cholesterol Robaxin - muscle spasms Zyrtec, flonase  - allergies    Type of Medication Issue Identified Description of Issue Recommendation(s)  Drug Interaction(s) (clinically significant)     Duplicate Therapy     Allergy     No Medication Administration End Date     Incorrect Dose     Additional Drug Therapy Needed     Significant med changes from prior encounter (inform family/care partners about these prior to discharge).  Stopped: esomeprazole , flexeril , penlac Communicate relevant medication changes to patient/family members at discharge from CIR.   Restart or discontinue PTA meds not resumed in CIR at discharge if clinically indicated.   Other       Clinically significant medication issues were identified that warrant physician communication and completion of prescribed/recommended actions by midnight of the next day:  No  Name of provider notified for urgent issues identified: N/A  Provider Method of Notification: N/A  Pharmacist comments: N/A  Time spent performing this drug regimen review (minutes):  20  Gregorio Cara, PharmD Clinical Pharmacist 03/12/2024 8:17 AM

## 2024-03-15 ENCOUNTER — Telehealth: Payer: Self-pay

## 2024-03-15 NOTE — Telephone Encounter (Signed)
 Spoke to patient via phone. Discussed dressing changes and recent event where the drain was tugged quite a bit when staff on floor was attempting to replace stat lock that had fallen off. She currently does not have a stat lock, does not report significant displacement of ext suture.  Scant output<10 cc daily for 48 hr. Increased discomfort around drain site. Flushes easily still without leakage around insertion. Denies fever, chills, n/v. Collection resolution vs malposition. At this time not scheduled in clinic until 11/13, will coordinate with schedulers for her to be seen sooner than this. Today is 10 days since drain placement. Last injected 10/29 with residual collection (had been <15 cc daily and then increased output day of injections, no malposition at that time). Clinic is able to accommodate this and will call patient with new appt.

## 2024-03-18 ENCOUNTER — Ambulatory Visit
Admission: RE | Admit: 2024-03-18 | Discharge: 2024-03-18 | Disposition: A | Discharge status: Home / Self Care | Source: Ambulatory Visit | Attending: Surgery | Admitting: Surgery

## 2024-03-18 ENCOUNTER — Other Ambulatory Visit: Payer: Self-pay | Admitting: Surgery

## 2024-03-18 ENCOUNTER — Inpatient Hospital Stay: Admission: RE | Admit: 2024-03-18 | Discharge: 2024-03-18 | Attending: Radiology | Admitting: Radiology

## 2024-03-18 ENCOUNTER — Ambulatory Visit: Admitting: Family Medicine

## 2024-03-18 ENCOUNTER — Inpatient Hospital Stay: Admitting: Family Medicine

## 2024-03-18 VITALS — BP 132/80 | HR 83 | Temp 97.8°F | Ht 65.5 in | Wt 176.0 lb

## 2024-03-18 DIAGNOSIS — I10 Essential (primary) hypertension: Secondary | ICD-10-CM

## 2024-03-18 DIAGNOSIS — Z9889 Other specified postprocedural states: Secondary | ICD-10-CM

## 2024-03-18 DIAGNOSIS — N739 Female pelvic inflammatory disease, unspecified: Secondary | ICD-10-CM

## 2024-03-18 DIAGNOSIS — R197 Diarrhea, unspecified: Secondary | ICD-10-CM | POA: Diagnosis not present

## 2024-03-18 DIAGNOSIS — K56609 Unspecified intestinal obstruction, unspecified as to partial versus complete obstruction: Secondary | ICD-10-CM

## 2024-03-18 DIAGNOSIS — R5381 Other malaise: Secondary | ICD-10-CM

## 2024-03-18 DIAGNOSIS — Z23 Encounter for immunization: Secondary | ICD-10-CM

## 2024-03-18 DIAGNOSIS — K9189 Other postprocedural complications and disorders of digestive system: Secondary | ICD-10-CM | POA: Diagnosis not present

## 2024-03-18 DIAGNOSIS — K567 Ileus, unspecified: Secondary | ICD-10-CM

## 2024-03-18 HISTORY — PX: IR RADIOLOGIST EVAL & MGMT: IMG5224

## 2024-03-18 MED ORDER — IOPAMIDOL (ISOVUE-300) INJECTION 61%: 100.0000 mL | Freq: Once | INTRAVENOUS | Status: DC | PRN

## 2024-03-18 NOTE — Assessment & Plan Note (Signed)
 Reviewed hospital records, lab results and studies in detail  S/p surgery for sbo with adhesions   Now on augmentin for 30 d  Out of hosp and rehab  Reassuring exam For gen surg follow up soon

## 2024-03-18 NOTE — Assessment & Plan Note (Addendum)
 Caused by adhesion from past surgery (? Oophrectomy)  Reviewed hospital records, lab results and studies in detail  S/p laparotomy followed by ileus and abscess  Now home post rehab stay and doing much better Gradually advised diet- small amts of protein   Gen surg follow up upcoming

## 2024-03-18 NOTE — Assessment & Plan Note (Signed)
 Doing well  Drain removed today  On augmentin Reviewed hospital records, lab results and studies in detail

## 2024-03-18 NOTE — Progress Notes (Signed)
 Subjective:    Patient ID: Jordan VEAR Cave, female    DOB: 1955/08/10, 68 y.o.   MRN: [id number]  HPI  Wt Readings from Last 3 Encounters:  03/18/24 176 lb (79.8 kg)  03/07/24 197 lb 5 oz (89.5 kg)  02/23/24 180 lb (81.6 kg)   28.84 kg/m  Vitals:   03/18/24 1201 03/18/24 1235  BP: (!) 142/74 132/80  Pulse: 83   Temp: 97.8 F (36.6 C)   SpO2: 99%     Pt presents for follow up after release from rehab/hospital after bowel obstruction  Had PT and OT   SBO- from adhesion  Required open laparotomy (after trying laparoscopy) Complicated by  Post osteoporosis ileus with diarrhea Pelvic abscess (post osteoporosis seroma) -currently has drain   Other complications Anasarca/ edema- lasix   Mod prot calorie malnutrition  Low K, mag  Factor V leiden (known)    Overall much stronger  Uses a cane if needed   Removed her gluteal JP drain earlier today   Augmentin bid for 30 days  Some diarrhea   Thinks diarrhea is due to this  Sees gen surg on the 18th   Some abdominal discomfort  Is bloated    Trying to get protein in  Eating small amounts   Trying to get in metamucil      Lab Results  Component Value Date   NA 139 03/12/2024   K 3.7 03/12/2024   CO2 24 03/12/2024   GLUCOSE 125 (H) 03/12/2024   BUN 7 (L) 03/12/2024   CREATININE 0.62 03/12/2024   CALCIUM 8.5 (L) 03/12/2024   GFR 85.50 09/30/2023   GFRNONAA >60 03/12/2024   Lab Results  Component Value Date   WBC 7.7 03/08/2024   HGB 10.3 (L) 03/08/2024   HCT 33.0 (L) 03/08/2024   MCV 89.7 03/08/2024   PLT 376 03/08/2024   Lab Results  Component Value Date   ALT 12 03/12/2024   AST 12 (L) 03/12/2024   ALKPHOS 66 03/12/2024   BILITOT 0.6 03/12/2024      Patient Active Problem List   Diagnosis Date Noted   Diarrhea 03/18/2024   H/O drainage of abscess 03/10/2024   Mild peripheral edema 03/10/2024   Ileus, postoperative (HCC) 03/10/2024   Pelvic abscess in female 03/08/2024    Debility 03/07/2024   Moderate protein-calorie malnutrition 02/27/2024   SBO (small bowel obstruction) (HCC) 02/20/2024   Back pain 12/23/2023   Adverse effect of metformin  09/30/2023   Hx of adenomatous polyp of colon 12/06/2022   Arm vein blood clot, right 12/26/2020   Estrogen deficiency 10/24/2020   History of pulmonary hypertension 06/08/2019   History of shingles 01/21/2018   Prediabetes 04/21/2015   Stress reaction 04/21/2015   Essential hypertension 04/21/2015   Migraine with aura 06/21/2013   Obesity 06/16/2011   Routine general medical examination at a health care facility 06/14/2011   SCIATICA 05/22/2009   GASTROESOPHAGEAL REFLUX DISEASE 04/14/2009   Factor 5 Leiden mutation, heterozygous 12/07/2007   Depression with anxiety 11/25/2006   IBS 11/25/2006   DVT, HX OF 11/25/2006   MIGRAINES, HX OF 11/25/2006   HYPERCHOLESTEROLEMIA, PURE 10/29/2006   Past Medical History:  Diagnosis Date   Allergy    Anxiety    Arthritis 2025   Toe/knee   Basal cell carcinoma    face   Cataract 2019   Clotting disorder 2009   Factor 5   Depression 1989   DVT (deep venous thrombosis) (HCC) 05/13/2006   Esophageal erosions  Factor V Leiden    On coag x 1 year ending in 09   Gastritis 05/13/1988   hospital- abd pain   GERD (gastroesophageal reflux disease)    Hx of adenomatous polyp of colon 12/06/2022   5 mm rectal adenoma repeat colonoscopy 2031    Hyperlipidemia    Hypertension    Radial head fracture 05/13/2009   Past Surgical History:  Procedure Laterality Date   BIOPSY BREAST Right 10/2022   BREAST SURGERY  2006   Ductal Papilloma/2023 breast biopsy-benign/2024 breast biopsy -benign   CESAREAN SECTION  1987   CHOLECYSTECTOMY  01/27/2003   COLONOSCOPY     DILATION AND CURETTAGE OF UTERUS  08/22/2022   IR CV LINE INJECTION  03/10/2024   IR RADIOLOGIST EVAL & MGMT  03/18/2024   LAPAROSCOPY N/A 02/23/2024   Procedure: LAPAROSCOPY, DIAGNOSTIC;  Surgeon: Vernetta Berg, MD;  Location: [facility] OR;  Service: General;  Laterality: N/A;   LAPAROTOMY N/A 02/23/2024   Procedure: LAPAROTOMY, EXPLORATORY;  Surgeon: Vernetta Berg, MD;  Location: [facility] OR;  Service: General;  Laterality: N/A;   MOHS SURGERY  11/10/2009   basal cell carcinoma near eye   OOPHORECTOMY     SHOULDER SURGERY  04/12/2006   SMALL INTESTINE SURGERY  02/23/2024   Small bowel obstruction   TONSILLECTOMY  05/13/1974   UPPER GASTROINTESTINAL ENDOSCOPY     Social History   Tobacco Use   Smoking status: Never   Smokeless tobacco: Never  Vaping Use   Vaping status: Never Used  Substance Use Topics   Alcohol use: Not Currently    Alcohol/week: 2.0 standard drinks of alcohol    Types: 1 Glasses of wine, 1 Standard drinks or equivalent per week    Comment: occ   Drug use: No   Family History  Problem Relation Age of Onset   Graves' disease Mother    Depression Mother    Osteoporosis Mother    Hearing loss Mother    Hypertension Mother    Stroke Mother    Vision loss Mother    Varicose Veins Mother    Heart attack Father        x 3    Diabetes Father    Heart disease Father    Hypertension Father    Vision loss Father    Breast cancer Sister    Hypertension Sister    Colon polyps Brother    Heart attack Brother        x 2   Diabetes Brother    Obesity Brother    Cancer Brother    Heart disease Brother    Hypertension Brother    Hypertension Brother    Coronary artery disease Brother        with stents    Atrial fibrillation Brother    Hypertension Maternal Grandmother    Stroke Maternal Grandmother    Hypertension Maternal Grandfather    Stroke Maternal Grandfather    Heart disease Paternal Grandmother    Hypertension Paternal Grandmother    Hypertension Paternal Grandfather    Kidney disease Paternal Grandfather    Obesity Paternal Grandfather    Diabetes Paternal Grandfather    Asthma Daughter    Heart disease Paternal Uncle    Heart disease Brother     Hypertension Brother    Heart disease Maternal Uncle    Hypertension Maternal Uncle    Diabetes Paternal Uncle    Heart disease Paternal Uncle    Hypertension Paternal Education Officer, Community  Diabetes Paternal Uncle    Heart disease Paternal Uncle    Hypertension Paternal Uncle    Heart disease Paternal Uncle    Hypertension Paternal Uncle    Diabetes Paternal Uncle    Heart disease Paternal Uncle    Hypertension Paternal Uncle    Cancer Sister    Hypertension Sister    Varicose Veins Sister    Anxiety disorder Maternal Aunt    Hypertension Maternal Aunt    Stroke Maternal Aunt    Hypertension Maternal Aunt    Stroke Maternal Aunt    Hypertension Paternal Uncle    COPD Maternal Uncle    Heart disease Maternal Uncle    Colon cancer Neg Hx    Esophageal cancer Neg Hx    Rectal cancer Neg Hx    Stomach cancer Neg Hx    Allergies  Allergen Reactions   Oxycodone-Acetaminophen     Hallucinations   Paroxetine Hives   Sulfa Antibiotics     Gi upset   Amoxicillin-Pot Clavulanate Other (See Comments)    REACTION: GI   Calcium     REACTION: muscle aches   Erythromycin Other (See Comments)    REACTION: GI   Guaifenesin     Feels yucky can tolerate lowest dosage in ER   Naproxen     REACTION: GI   Oxycodone-Aspirin     REACTION: reaction not known    Sertraline Rash and Other (See Comments)    REACTION: hives   Current Outpatient Medications on File Prior to Visit  Medication Sig Dispense Refill   acetaminophen (TYLENOL) 500 MG tablet Take 2 tablets (1,000 mg total) by mouth every 6 (six) hours as needed for mild pain (pain score 1-3) or headache. 30 tablet 0   ALPRAZolam  (XANAX ) 0.25 MG tablet Take 1 tablet (0.25 mg total) by mouth at bedtime. 30 tablet 0   amoxicillin-clavulanate (AUGMENTIN) 875-125 MG tablet Take 1 tablet by mouth every 12 (twelve) hours. 60 tablet 0   aspirin EC 81 MG tablet Take 1 tablet (81 mg total) by mouth daily. Swallow whole. 30 tablet 12   bisacodyl  (DULCOLAX) 10 MG suppository Place 1 suppository (10 mg total) rectally daily as needed for moderate constipation. 12 suppository 0   buPROPion  (WELLBUTRIN  XL) 300 MG 24 hr tablet Take 1 tablet (300 mg total) by mouth daily. TAKE 1 TABLET BY MOUTH EVERY DAY 90 tablet 3   Cetirizine HCl (ZYRTEC PO) Take 1 tablet by mouth daily as needed (for allergies).     Cholecalciferol (VITAMIN D3 PO) Take 5,000 Units by mouth daily.     diphenoxylate-atropine (LOMOTIL) 2.5-0.025 MG tablet Take 2 tablets by mouth 4 (four) times daily as needed for diarrhea or loose stools. 30 tablet 0   fluconazole (DIFLUCAN) 150 MG tablet Take 1 tablet (150 mg total) by mouth once as needed for up to 1 dose. 1 tablet 0   fluticasone  (FLONASE ) 50 MCG/ACT nasal spray PLACE 2 SPRAYS INTO THE NOSE DAILY AS NEEDED 48 g 3   furosemide  (LASIX ) 20 MG tablet Take 1 tablet by mouth daily as needed or twice weekly as needed for swelling 30 tablet 0   Lactobacillus (ACIDOPHILUS) CAPS capsule Take 1 capsule by mouth daily. 30 capsule 0   lisinopril (ZESTRIL) 10 MG tablet Take 1 tablet (10 mg total) by mouth daily. 30 tablet 0   metFORMIN  (GLUCOPHAGE -XR) 500 MG 24 hr tablet Take 2 tablets (1,000 mg total) by mouth daily. 180 tablet 1   methocarbamol (  ROBAXIN) 500 MG tablet Take 1 tablet (500 mg total) by mouth every 8 (eight) hours as needed for muscle spasms. 30 tablet 0   metoprolol  succinate (TOPROL -XL) 25 MG 24 hr tablet Take 1 tablet (25 mg total) by mouth daily. Take with or immediately following a meal. 90 tablet 3   pantoprazole (PROTONIX) 40 MG tablet Take 1 tablet (40 mg total) by mouth daily before breakfast. 30 tablet 0   potassium chloride  (KLOR-CON ) 10 MEQ tablet Take 1 tablet (10 mEq total) by mouth daily. AND AS NEEDED WITH YOUR LASIX  (Patient taking differently: Take 10 mEq by mouth See admin instructions. Take 1 tablet by mouth along with Lasix  as needed for swelling) 90 tablet 3   psyllium (HYDROCIL/METAMUCIL) 95 % PACK Take  1 packet by mouth 2 (two) times daily. 240 each 0   simvastatin  (ZOCOR ) 10 MG tablet TAKE 1 TABLET BY MOUTH EVERY DAY 90 tablet 2   Current Facility-Administered Medications on File Prior to Visit  Medication Dose Route Frequency Provider Last Rate Last Admin   iopamidol (ISOVUE-300) 61 % injection 100 mL  100 mL Intravenous Once PRN Cornett, Thomas, MD        Review of Systems  Constitutional:  Negative for activity change, appetite change, fatigue, fever and unexpected weight change.  HENT:  Negative for congestion, ear pain, rhinorrhea, sinus pressure and sore throat.   Eyes:  Negative for pain, redness and visual disturbance.  Respiratory:  Negative for cough, shortness of breath and wheezing.   Cardiovascular:  Negative for chest pain and palpitations.  Gastrointestinal:  Positive for diarrhea. Negative for abdominal distention, abdominal pain, blood in stool, constipation, nausea, rectal pain and vomiting.       Abdominal soreness Bloating    Endocrine: Negative for polydipsia and polyuria.  Genitourinary:  Negative for dysuria, frequency and urgency.  Musculoskeletal:  Negative for arthralgias, back pain and myalgias.  Skin:  Negative for pallor and rash.  Allergic/Immunologic: Negative for environmental allergies.  Neurological:  Negative for dizziness, syncope and headaches.       Strength is improving  Hematological:  Negative for adenopathy. Does not bruise/bleed easily.  Psychiatric/Behavioral:  Negative for decreased concentration and dysphoric mood. The patient is not nervous/anxious.        Objective:   Physical Exam Constitutional:      General: She is not in acute distress.    Appearance: Normal appearance. She is well-developed. She is obese. She is not ill-appearing or diaphoretic.  HENT:     Head: Normocephalic and atraumatic.  Eyes:     Conjunctiva/sclera: Conjunctivae normal.     Pupils: Pupils are equal, round, and reactive to light.  Neck:     Thyroid :  No thyromegaly.     Vascular: No carotid bruit or JVD.  Cardiovascular:     Rate and Rhythm: Normal rate and regular rhythm.     Heart sounds: Normal heart sounds.     No gallop.  Pulmonary:     Effort: Pulmonary effort is normal. No respiratory distress.     Breath sounds: Normal breath sounds. No wheezing or rales.  Abdominal:     General: Abdomen is protuberant. Bowel sounds are normal. There is no distension or abdominal bruit.     Palpations: Abdomen is soft. There is no shifting dullness, fluid wave, hepatomegaly, mass or pulsatile mass.     Tenderness: There is no abdominal tenderness. There is no right CVA tenderness or left CVA tenderness.  Comments: Bandage present   Musculoskeletal:     Cervical back: Normal range of motion and neck supple.     Right lower leg: Edema present.     Left lower leg: Edema present.     Comments: Trace ankle edema   Lymphadenopathy:     Cervical: No cervical adenopathy.  Skin:    General: Skin is warm and dry.     Coloration: Skin is not pale.     Findings: No rash.  Neurological:     Mental Status: She is alert.     Motor: No weakness.     Coordination: Coordination normal.     Gait: Gait normal.     Deep Tendon Reflexes: Reflexes are normal and symmetric. Reflexes normal.  Psychiatric:        Mood and Affect: Mood normal.           Assessment & Plan:   Problem List Items Addressed This Visit       Cardiovascular and Mediastinum   Essential hypertension   bp in fair control at this time  BP Readings from Last 1 Encounters:  03/18/24 132/80   No changes needed Most recent labs reviewed  Disc lifstyle change with low sodium diet and exercise  Lisinopril 10 mg daily Metoprolol  xl 25 mg daily      Relevant Orders   Basic metabolic panel with GFR   CBC with Differential/Platelet   Hepatic function panel     Digestive   SBO (small bowel obstruction) (HCC)   Caused by adhesion from past surgery (?  Oophrectomy)  Reviewed hospital records, lab results and studies in detail  S/p laparotomy followed by ileus and abscess  Now home post rehab stay and doing much better Gradually advised diet- small amts of protein   Gen surg follow up upcoming       Relevant Orders   Basic metabolic panel with GFR   CBC with Differential/Platelet   Hepatic function panel   Ileus, postoperative [facility])   Reviewed hospital records, lab results and studies in detail  Much improvement now home from rehab Eating protein in small servings  Continues some diarrhea / c diff testing sent   Plan labs 2-3 weeks for cbc/chem  Anemic post surg        Other   Pelvic abscess in female   Reviewed hospital records, lab results and studies in detail  S/p surgery for sbo with adhesions   Now on augmentin for 30 d  Out of hosp and rehab  Reassuring exam For gen surg follow up soon      H/O drainage of abscess   Doing well  Drain removed today  On augmentin Reviewed hospital records, lab results and studies in detail        Relevant Orders   Basic metabolic panel with GFR   CBC with Differential/Platelet   Hepatic function panel   Diarrhea - Primary   S/p sbo and then ileus  Taking augmentin for pelvic abscess  Reviewed hospital records, lab results and studies in detail    C diff test sent Taking fiber to bulk up stool Staying hydrated Reassuring exam       Relevant Orders   C. difficile GDH and Toxin A/B   Basic metabolic panel with GFR   CBC with Differential/Platelet   Hepatic function panel   Debility   Gained strength in rehab  Improved and at home  Gradually advancing activity      Other  Visit Diagnoses       Need for influenza vaccination       Relevant Orders   Flu vaccine HIGH DOSE PF(Fluzone Trivalent) (Completed)

## 2024-03-18 NOTE — Progress Notes (Signed)
 Chief Complaint: Patient was seen in consultation today for  at the request of Omohundro,Jennifer C  Referring Physician(s): Omohundro,Jennifer C  History of Present Illness: Kristine [name] is a 69 y.o. female with a history of small bowel obstruction requiring exploratory laparotomy with repair of small bowel enterotomy complicated by pelvic fluid collection.  A left transgluteal drain was placed on 03/05/2024.    She presents today for follow-up.  CT scan demonstrates complete resolution of the fluid collection.  Drain injection demonstrates a collapsed abscess cavity with no evidence of fistula.  Patient reports she is feeling well and denies fever or chills.  Drain output is scant and serosanguineous consistent with the flush material.  The drain was transected and easily removed.  Past Medical History:  Diagnosis Date   Anxiety    Arthritis 2025   Toe/knee   Basal cell carcinoma    face   Cataract 2019   Clotting disorder 2009   Factor 5   Depression 1989   DVT (deep venous thrombosis) (HCC) 05/13/2006   Esophageal erosions    Factor V Leiden    On coag x 1 year ending in 09   Gastritis 05/13/1988   hospital- abd pain   GERD (gastroesophageal reflux disease)    Hx of adenomatous polyp of colon 12/06/2022   5 mm rectal adenoma repeat colonoscopy 2031    Hyperlipidemia    Hypertension    Radial head fracture 05/13/2009    Past Surgical History:  Procedure Laterality Date   BIOPSY BREAST Right 10/2022   BREAST SURGERY  2006   Ductal Papilloma/2023 breast biopsy-benign/2024 breast biopsy -benign   CESAREAN SECTION  1987   CHOLECYSTECTOMY  01/27/2003   COLONOSCOPY     DILATION AND CURETTAGE OF UTERUS  08/22/2022   IR CV LINE INJECTION  03/10/2024   IR RADIOLOGIST EVAL & MGMT  03/18/2024   LAPAROSCOPY N/A 02/23/2024   Procedure: LAPAROSCOPY, DIAGNOSTIC;  Surgeon: Vernetta Berg, MD;  Location: [facility] OR;  Service: General;  Laterality: N/A;    LAPAROTOMY N/A 02/23/2024   Procedure: LAPAROTOMY, EXPLORATORY;  Surgeon: Vernetta Berg, MD;  Location: [facility] OR;  Service: General;  Laterality: N/A;   MOHS SURGERY  11/10/2009   basal cell carcinoma near eye   OOPHORECTOMY     SHOULDER SURGERY  04/12/2006   TONSILLECTOMY  05/13/1974   UPPER GASTROINTESTINAL ENDOSCOPY      Allergies: Oxycodone-acetaminophen, Paroxetine, Sulfa antibiotics, Amoxicillin-pot clavulanate, Calcium, Erythromycin, Guaifenesin, Naproxen, Oxycodone-aspirin, and Sertraline  Medications: Prior to Admission medications   Medication Sig Start Date End Date Taking? Authorizing Provider  acetaminophen (TYLENOL) 500 MG tablet Take 2 tablets (1,000 mg total) by mouth every 6 (six) hours as needed for mild pain (pain score 1-3) or headache. 03/11/24   Jerilynn Daphne SAILOR, NP  Lactobacillus (ACIDOPHILUS) CAPS capsule Take 1 capsule by mouth daily. 03/11/24   Jerilynn Daphne SAILOR, NP  ALPRAZolam  (XANAX ) 0.25 MG tablet Take 1 tablet (0.25 mg total) by mouth at bedtime. 03/11/24   Jerilynn Daphne SAILOR, NP  amoxicillin-clavulanate (AUGMENTIN) 875-125 MG tablet Take 1 tablet by mouth every 12 (twelve) hours. 03/11/24   Jerilynn Daphne SAILOR, NP  aspirin EC 81 MG tablet Take 1 tablet (81 mg total) by mouth daily. Swallow whole. 03/11/24   Jerilynn Daphne SAILOR, NP  bisacodyl (DULCOLAX) 10 MG suppository Place 1 suppository (10 mg total) rectally daily as needed for moderate constipation. 03/11/24   Jerilynn Daphne SAILOR, NP  buPROPion  (WELLBUTRIN  XL) 300 MG 24  hr tablet Take 1 tablet (300 mg total) by mouth daily. TAKE 1 TABLET BY MOUTH EVERY DAY 12/23/23   Tower, Laine LABOR, MD  Cetirizine HCl (ZYRTEC PO) Take 1 tablet by mouth daily as needed (for allergies). 03/13/20   [provider]  Cholecalciferol (VITAMIN D3 PO) Take 5,000 Units by mouth daily.    [provider]  diphenoxylate-atropine (LOMOTIL) 2.5-0.025 MG tablet Take 2 tablets by mouth 4 (four) times daily as needed for  diarrhea or loose stools. 03/11/24   Jerilynn Daphne SAILOR, NP  fluconazole (DIFLUCAN) 150 MG tablet Take 1 tablet (150 mg total) by mouth once as needed for up to 1 dose. 03/11/24   Jerilynn Daphne SAILOR, NP  fluticasone  (FLONASE ) 50 MCG/ACT nasal spray PLACE 2 SPRAYS INTO THE NOSE DAILY AS NEEDED 04/03/18   Tower, Laine LABOR, MD  furosemide  (LASIX ) 20 MG tablet Take 1 tablet by mouth daily as needed or twice weekly as needed for swelling 03/11/24   Jerilynn Daphne SAILOR, NP  lisinopril (ZESTRIL) 10 MG tablet Take 1 tablet (10 mg total) by mouth daily. 03/11/24   Jerilynn Daphne SAILOR, NP  metFORMIN  (GLUCOPHAGE -XR) 500 MG 24 hr tablet Take 2 tablets (1,000 mg total) by mouth daily. 01/23/24   Tower, Laine LABOR, MD  methocarbamol (ROBAXIN) 500 MG tablet Take 1 tablet (500 mg total) by mouth every 8 (eight) hours as needed for muscle spasms. 03/11/24   Jerilynn Daphne SAILOR, NP  metoprolol  succinate (TOPROL -XL) 25 MG 24 hr tablet Take 1 tablet (25 mg total) by mouth daily. Take with or immediately following a meal. 09/29/23 02/19/25  Okey Vina GAILS, MD  pantoprazole (PROTONIX) 40 MG tablet Take 1 tablet (40 mg total) by mouth daily before breakfast. 03/11/24   Jerilynn Daphne SAILOR, NP  potassium chloride  (KLOR-CON ) 10 MEQ tablet Take 1 tablet (10 mEq total) by mouth daily. AND AS NEEDED WITH YOUR LASIX  Patient taking differently: Take 10 mEq by mouth See admin instructions. Take 1 tablet by mouth along with Lasix  as needed for swelling 08/20/22   Parthenia Olivia HERO, PA-C  psyllium (HYDROCIL/METAMUCIL) 95 % PACK Take 1 packet by mouth 2 (two) times daily. 03/11/24   Jerilynn Daphne SAILOR, NP  simvastatin  (ZOCOR ) 10 MG tablet TAKE 1 TABLET BY MOUTH EVERY DAY 09/26/23   Tower, Laine LABOR, MD  sodium chloride  flush 0.9 % SOLN injection Flush drain ONCE daily with 5 cc NS 03/12/24   Jerilynn Daphne SAILOR, NP     Family History  Problem Relation Age of Onset   Graves' disease Mother    Depression Mother    Osteoporosis Mother    Hearing loss  Mother    Hypertension Mother    Stroke Mother    Vision loss Mother    Varicose Veins Mother    Heart attack Father        x 3    Diabetes Father    Heart disease Father    Hypertension Father    Vision loss Father    Breast cancer Sister    Hypertension Sister    Colon polyps Brother    Heart attack Brother        x 2   Diabetes Brother    Obesity Brother    Cancer Brother    Heart disease Brother    Hypertension Brother    Hypertension Brother    Coronary artery disease Brother        with stents    Atrial fibrillation Brother  Hypertension Maternal Grandmother    Stroke Maternal Grandmother    Hypertension Maternal Grandfather    Stroke Maternal Grandfather    Heart disease Paternal Grandmother    Hypertension Paternal Grandmother    Hypertension Paternal Grandfather    Kidney disease Paternal Grandfather    Obesity Paternal Grandfather    Asthma Daughter    Heart disease Paternal Uncle    Heart disease Brother    Hypertension Brother    Heart disease Maternal Uncle    Hypertension Maternal Uncle    Diabetes Paternal Uncle    Heart disease Paternal Uncle    Hypertension Paternal Uncle    Diabetes Paternal Uncle    Heart disease Paternal Uncle    Hypertension Paternal Uncle    Heart disease Paternal Uncle    Hypertension Paternal Uncle    Diabetes Paternal Uncle    Heart disease Paternal Uncle    Hypertension Paternal Uncle    Cancer Sister    Hypertension Sister    Varicose Veins Sister    Anxiety disorder Maternal Aunt    Hypertension Maternal Aunt    Stroke Maternal Aunt    Hypertension Maternal Aunt    Stroke Maternal Aunt    Hypertension Paternal Uncle    COPD Maternal Uncle    Heart disease Maternal Uncle    Colon cancer Neg Hx    Esophageal cancer Neg Hx    Rectal cancer Neg Hx    Stomach cancer Neg Hx     Social History   Socioeconomic History   Marital status: Married    Spouse name: Not on file   Number of children: 1   Years of  education: Not on file   Highest education level: Associate degree: occupational, scientist, product/process development, or vocational program  Occupational History   Occupation: Producer, Television/film/video: [employer]  Tobacco Use   Smoking status: Never   Smokeless tobacco: Never  Vaping Use   Vaping status: Never Used  Substance and Sexual Activity   Alcohol use: Not Currently    Alcohol/week: 2.0 standard drinks of alcohol    Types: 1 Glasses of wine, 1 Standard drinks or equivalent per week    Comment: occ   Drug use: No   Sexual activity: Yes    Birth control/protection: Post-menopausal  Other Topics Concern   Not on file  Social History Narrative   Some walking for exercise         Hematology - Dr. Deretha Beers - Dr. Daldorf   [facility]- [facility]- Dr. Bernardo   Social Drivers of Health   Financial Resource Strain: Low Risk  (12/22/2023)   Overall Financial Resource Strain (CARDIA)    Difficulty of Paying Living Expenses: Not hard at all  Food Insecurity: No Food Insecurity (02/20/2024)   Hunger Vital Sign    Worried About Running Out of Food in the Last Year: Never true    Ran Out of Food in the Last Year: Never true  Transportation Needs: No Transportation Needs (02/20/2024)   PRAPARE - Administrator, Civil Service (Medical): No    Lack of Transportation (Non-Medical): No  Physical Activity: Sufficiently Active (12/22/2023)   Exercise Vital Sign    Days of Exercise per Week: 3 days    Minutes of Exercise per Session: 50 min  Stress: No Stress Concern Present (12/22/2023)   [employer] of Occupational Health - Occupational Stress Questionnaire    Feeling of Stress: Only a little  Social Connections: Unknown (02/20/2024)   Social Connection and Isolation Panel    Frequency of Communication with Friends and Family: Twice a week    Frequency of Social Gatherings with Friends and Family: Not on file    Attends Religious Services: Never    Database Administrator or  Organizations: No    Attends Engineer, Structural: Never    Marital Status: Married  Recent Concern: Social Connections - Moderately Isolated (02/20/2024)   Social Connection and Isolation Panel    Frequency of Communication with Friends and Family: Twice a week    Frequency of Social Gatherings with Friends and Family: Twice a week    Attends Religious Services: Never    Diplomatic Services Operational Officer: Not on file    Attends Banker Meetings: Never    Marital Status: Married   Review of Systems: A 12 point ROS discussed and pertinent positives are indicated in the HPI above.  All other systems are negative.  Review of Systems  Vital Signs: There were no vitals taken for this visit.  Advance Care Plan: The advanced care plan/surrogate decision maker was discussed at the time of visit and the patient did not wish to discuss or was not able to name a surrogate decision maker or provide an advance care plan.    Physical Exam Constitutional:      General: She is not in acute distress.    Appearance: Normal appearance.  HENT:     Head: Normocephalic and atraumatic.  Eyes:     General: No scleral icterus. Cardiovascular:     Rate and Rhythm: Normal rate.  Pulmonary:     Effort: Pulmonary effort is normal.  Abdominal:     General: There is no distension.     Palpations: Abdomen is soft.     Tenderness: There is no abdominal tenderness.  Skin:    General: Skin is warm and dry.     Findings: No erythema or rash.  Neurological:     Mental Status: She is alert and oriented to person, place, and time.  Psychiatric:        Mood and Affect: Mood normal.        Behavior: Behavior normal.       Imaging: DG Sinus/Fist Tube Chk-Non GI Result Date: 03/18/2024 INDICATION: 68 year old female with a history of complicated diverticulitis and abscess formation status post placement of a percutaneous drainage catheter. There was fistulous communication  between the drainage catheter and the sigmoid colon on prior evaluation. EXAM: Drain injection MEDICATIONS: None. ANESTHESIA/SEDATION: None. COMPLICATIONS: None immediate. PROCEDURE: Gentle hand injection of contrast material was performed through the drainage catheter under real-time fluoroscopic guidance. This reveals a collapsed abscess cavity. No residual fistula identified. IMPRESSION: Interval resolution of previously identified fistulous communication with the sigmoid colon. Therefore, the drainage catheter was removed. Electronically Signed   By: Wilkie Lent M.D.   On: 03/18/2024 10:05   CT ABDOMEN PELVIS WO CONTRAST Result Date: 03/18/2024 CLINICAL DATA:  Complicated sigmoid diverticulitis with percutaneous drainage catheter in place. EXAM: CT ABDOMEN AND PELVIS WITHOUT CONTRAST TECHNIQUE: Multidetector CT imaging of the abdomen and pelvis was performed following the standard protocol without IV contrast. RADIATION DOSE REDUCTION: This exam was performed according to the departmental dose-optimization program which includes automated exposure control, adjustment of the mA and/or kV according to patient size and/or use of iterative reconstruction technique. COMPARISON:  Prior CT abdomen/pelvis 02/17/2024 FINDINGS: Lower chest: No acute abnormality. Hepatobiliary: No  focal liver abnormality is seen. Status post cholecystectomy. No biliary dilatation. Pancreas: Unremarkable. No pancreatic ductal dilatation or surrounding inflammatory changes. Spleen: Normal in size without focal abnormality. Adrenals/Urinary Tract: Normal adrenal glands. No hydronephrosis or renal contour abnormality to suggest underlying mass. Punctate 2-3 mm stone in the left lower pole collecting system. No stones visible on the right. No ureteral or bladder stones. Stomach/Bowel: Stomach is within normal limits. Appendix appears normal. No evidence of bowel wall thickening, distention, or inflammatory changes. Vascular/Lymphatic:  Limited evaluation in the absence of intravenous contrast. Mild atherosclerotic vascular calcifications. No aneurysm. No suspicious lymphadenopathy. Reproductive: Uterus and bilateral adnexa are unremarkable. Other: Percutaneous drainage catheter via a right transgluteal approach remains in good position. No evidence of undrained abscess. Small amount of thickening in gas in the anterior abdominal wall along the prior incision tract. No undrained abscess. Musculoskeletal: Multilevel degenerative disc disease and facet arthropathy. No acute changes. Stable mild grade 1 anterolisthesis of L3 on L4. IMPRESSION: 1. Well-positioned percutaneous drainage catheter with no evidence of residual or undrained abscess. 2. No evidence of active diverticulitis. 3. Additional ancillary findings as above without interval change. Electronically Signed   By: Wilkie Lent M.D.   On: 03/18/2024 10:04   IR Radiologist Eval & Mgmt Result Date: 03/18/2024 EXAM: ESTABLISHED PATIENT OFFICE VISIT CHIEF COMPLAINT: SEE NOTE IN EPIC HISTORY OF PRESENT ILLNESS: SEE NOTE IN EPIC REVIEW OF SYSTEMS: SEE NOTE IN EPIC PHYSICAL EXAMINATION: SEE NOTE IN EPIC ASSESSMENT AND PLAN: SEE NOTE IN EPIC Electronically Signed   By: Wilkie Lent M.D.   On: 03/18/2024 09:26   IR INJECT INDWELLING DRAINAGE CATHETER Result Date: 03/10/2024 INDICATION: Pelvic abscess EXAM: Fluoroscopic injection of the left trans gluteal pelvic abscess drain MEDICATIONS: The patient is currently admitted to the hospital and receiving intravenous antibiotics. The antibiotics were administered within an appropriate time frame prior to the initiation of the procedure. ANESTHESIA/SEDATION: Total intra-service moderate Sedation Time: None. The patient's level of consciousness and vital signs were monitored continuously by radiology nursing throughout the procedure under my direct supervision. COMPLICATIONS: None immediate. PROCEDURE: Informed written consent was obtained  from the patient after a thorough discussion of the procedural risks, benefits and alternatives. All questions were addressed. Maximal Sterile Barrier Technique was utilized including caps, mask, sterile gowns, sterile gloves, sterile drape, hand hygiene and skin antiseptic. A timeout was performed prior to the initiation of the procedure. Previous imaging reviewed. Drain injection performed under fluoroscopy. Images obtained. Stable drain catheter position. There is a smaller collapsed but residual abscess cavity in the cul-de-sac correlating with the recent CT 2 days ago. No visualized fistula. Drain was flushed and reconnected to external suction bulb. Mildly exudative fluid within the drain bulb. IMPRESSION: 1. Smaller residual abscess cavity in the cul-de-sac. 2. No visualized fistula. 3. Drain flushed and reconnected to external suction bulb. PLAN: Recommend continued daily flushing and external suction bulb. Patient can be scheduled for outpatient drain follow-up in the IR clinic in 10-14 days. Electronically Signed   By: CHRISTELLA.  Shick M.D.   On: 03/10/2024 14:59   CT ABDOMEN PELVIS W CONTRAST Result Date: 03/08/2024 EXAM: CT ABDOMEN AND PELVIS WITH CONTRAST 03/08/2024 07:53:01 PM TECHNIQUE: CT of the abdomen and pelvis was performed with the administration of 75 mL of iohexol (OMNIPAQUE) 350 MG/ML injection. Multiplanar reformatted images are provided for review. Automated exposure control, iterative reconstruction, and/or weight-based adjustment of the mA/kV was utilized to reduce the radiation dose to as low as reasonably achievable. COMPARISON: None  available. CLINICAL HISTORY: Intra-abdominal abscess. FINDINGS: LOWER CHEST: No acute abnormality. LIVER: The liver is unremarkable. GALLBLADDER AND BILE DUCTS: Status post cholecystectomy. No biliary ductal dilatation. SPLEEN: No acute abnormality. PANCREAS: No acute abnormality. ADRENAL GLANDS: No acute abnormality. KIDNEYS, URETERS AND BLADDER: Punctate 1 -  2 mm nonobstructing calculus within the lower pole of the left kidney. No ureteral calculi. No hydronephrosis. No perinephric inflammatory stranding or fluid collections were identified. The bladder is unremarkable. GI AND BOWEL: The stomach, small bowel, and large bowel are otherwise unremarkable. Appendix normal. There is no bowel obstruction. PERITONEUM AND RETROPERITONEUM: Interval left transgluteal pigtail drainage catheter placement with near complete evacuation of the loculated fluid collection within the cul-de-sac. Stable 2.1 cm rim enhancing collection within the right adnexa. No new loculated intraabdominal fluid collections. No free intraperitoneal gas or fluid. Stable mesenteric edema likely postsurgical in nature. VASCULATURE: Aorta is normal in caliber. LYMPH NODES: No lymphadenopathy. REPRODUCTIVE ORGANS: Stable 2.1 cm rim enhancing collection within the right adnexa. BONES AND SOFT TISSUES: Laparotomy incision again identified with superficial dehiscence in the region of the umbilicus and minimal gas fluid seen tracking into the left rectus sheath medially undermining the left abdominal wall subcutaneous fat. No discrete drainable fluid collection. No intraperitoneal extension. Osseous structures are age appropriate. No acute bone abnormality. No lytic or blastic bone lesion. IMPRESSION: 1. Interval left transgluteal pigtail drainage catheter placement with near-complete evacuation of the prior cul-de-sac fluid collection; no new loculated intra-abdominal collections. 2. Stable 2.1 cm rim-enhancing collection within the right adnexa. 3. Laparotomy incision with superficial dehiscence at the umbilicus and minimal gas/fluid tracking into the left rectus sheath with mild subcutaneous undermining; no discrete drainable collection and no intraperitoneal extension. 4. Punctate 12 mm nonobstructing left lower pole renal calculus. Electronically signed by: Dorethia Molt MD 03/08/2024 10:11 PM EDT RP  Workstation: [id number]   CT GUIDED PERITONEAL/RETROPERITONEAL FLUID DRAIN BY PERC CATH Result Date: 03/05/2024 INDICATION: Pelvic abscess EXAM: CT-GUIDED DRAINAGE OF THE POSTERIOR PELVIC ABSCESS MEDICATIONS: The patient is currently admitted to the hospital and receiving intravenous antibiotics. The antibiotics were administered within an appropriate time frame prior to the initiation of the procedure. ANESTHESIA/SEDATION: Moderate (conscious) sedation was employed during this procedure. A total of Versed 2.5 mg and Fentanyl 150 mcg was administered intravenously by the radiology nurse. Total intra-service moderate Sedation Time: 13 minutes. The patient's level of consciousness and vital signs were monitored continuously by radiology nursing throughout the procedure under my direct supervision. COMPLICATIONS: None immediate. PROCEDURE: Informed written consent was obtained from the patient after a thorough discussion of the procedural risks, benefits and alternatives. All questions were addressed. Maximal Sterile Barrier Technique was utilized including caps, mask, sterile gowns, sterile gloves, sterile drape, hand hygiene and skin antiseptic. A timeout was performed prior to the initiation of the procedure. previous imaging reviewed. patient positioned prone. noncontrast localization CT performed. the posterior pelvic abscess was localized and marked for a left transgluteal approach. Under sterile conditions and local anesthesia, the 18 gauge 15 cm access needle was advanced from a left transgluteal medial approach into the fluid collection. Needle position confirmed with CT. Syringe aspiration yielded purulent fluid. Sample sent for culture. Guidewire inserted followed by tract dilatation to insert a 10 French drain. Drain catheter position confirmed with CT. Syringe aspiration yielded a total of 30 cc purulent fluid. Catheter was secured with a silk suture and a sterile dressing. External suction bulb  applied followed by sterile dressing. No immediate complication. Patient tolerated the procedure well.  IMPRESSION: Successful CT guided pelvic abscess drain placement as above. Electronically Signed   By: CHRISTELLA.  Shick M.D.   On: 03/05/2024 15:11   CT ABDOMEN PELVIS W CONTRAST Result Date: 03/04/2024 EXAM: CT ABDOMEN AND PELVIS WITH CONTRAST 03/04/2024 12:23:00 PM TECHNIQUE: CT of the abdomen and pelvis was performed with the administration of 75 mL of iohexol (OMNIPAQUE) 350 MG/ML injection. Multiplanar reformatted images are provided for review. Automated exposure control, iterative reconstruction, and/or weight-based adjustment of the mA/kV was utilized to reduce the radiation dose to as low as reasonably achievable. COMPARISON: 02/29/2024 CLINICAL HISTORY: Intra-abdominal abscess. Abdominal Pain. FINDINGS: LOWER CHEST: No acute abnormality. LIVER: The liver is unremarkable. GALLBLADDER AND BILE DUCTS: Cholecystectomy. No biliary ductal dilatation. SPLEEN: No acute abnormality. PANCREAS: No acute abnormality. ADRENAL GLANDS: No acute abnormality. KIDNEYS, URETERS AND BLADDER: 2 mm left kidney lower pole nonobstructive renal calculus. No stones in the ureters. No hydronephrosis. No perinephric or periureteral stranding. Urinary bladder is unremarkable. GI AND BOWEL: Stomach demonstrates no acute abnormality. Scattered small bowel wall thickening and mesenteric edema. There are dilated loops of small bowel up to 3.6 cm in diameter with transition to nondilated small bowel in the left abdomen at the site of a small bowel angulation. Distal small bowel loops are decompressed. There is no bowel obstruction. PERITONEUM AND RETROPERITONEUM: Small amount of scattered free intraperitoneal gas, reduced from prior. There is a pelvic fluid collection with some higher density components along the anterior margin of the collection. The collection is 11.9 x 6.2 x 3.6 cm (volume = 140 cm). The collection is roughly similar to  prior. VASCULATURE: Aorta is normal in caliber. LYMPH NODES: No lymphadenopathy. REPRODUCTIVE ORGANS: No acute abnormality. BONES AND SOFT TISSUES: Lower lumbar spondylosis and degenerative disc disease. Subcutaneous gas and edema along the left upper thigh, expected given the recent incision. There is a 3.8 x 2.2 x 3.2 cm (volume = 14 cm) collection along the left anterior abdominal wall at the midline incision site, previously 4.0 x 2.4 x 3.5 cm (volume = 18 cm). The collection could represent abscess or hematoma given the recent surgery. IMPRESSION: 1. Small bowel obstruction with transition in the left abdomen at a site of small bowel angulation. 2. Pelvic fluid collection, roughly similar to prior, measuring 11.9 x 6.2 x 3.6 cm (volume = 140 cm). 3. Left anterior abdominal wall collection at the midline incision site, possibly representing abscess or hematoma, measuring 3.8 x 2.2 x 3.2 cm (volume = 14 cm), previously 4.0 x 2.4 x 3.5 cm (volume = 18 cm). Electronically signed by: Lonni Necessary MD 03/04/2024 04:37 PM EDT RP Workstation: [id number]   VAS US  LOWER EXTREMITY VENOUS (DVT) Result Date: 03/02/2024  Lower Venous DVT Study Patient Name:  CAMPBELL AGRAMONTE Bown  Date of Exam:   03/02/2024 Medical Rec #: [id number]             Accession #:    [id number] Date of Birth: 13-Dec-1955             Patient Gender: F Patient Age:   23 years Exam Location:  [facility] Procedure:      VAS US  LOWER EXTREMITY VENOUS (DVT) Referring Phys: CHAPMAN ROTA --------------------------------------------------------------------------------  Indications: Swelling.  Risk Factors: None identified. Limitations: Body habitus and poor ultrasound/tissue interface. Comparison Study: No prior studies. Performing Technologist: Cordella Collet RVT  Examination Guidelines: A complete evaluation includes B-mode imaging, spectral Doppler, color Doppler, and power Doppler as needed of all accessible portions of each  vessel. Bilateral testing is considered an integral part of a complete examination. Limited examinations for reoccurring indications may be performed as noted. The reflux portion of the exam is performed with the patient in reverse Trendelenburg.  +---------+---------------+---------+-----------+----------+--------------+ RIGHT    CompressibilityPhasicitySpontaneityPropertiesThrombus Aging +---------+---------------+---------+-----------+----------+--------------+ CFV      Full           Yes      Yes                                 +---------+---------------+---------+-----------+----------+--------------+ SFJ      Full                                                        +---------+---------------+---------+-----------+----------+--------------+ FV Prox  Full                                                        +---------+---------------+---------+-----------+----------+--------------+ FV Mid   Full                                                        +---------+---------------+---------+-----------+----------+--------------+ FV DistalFull                                                        +---------+---------------+---------+-----------+----------+--------------+ PFV      Full                                                        +---------+---------------+---------+-----------+----------+--------------+ POP      Full           Yes      Yes                                 +---------+---------------+---------+-----------+----------+--------------+ PTV      Full                                                        +---------+---------------+---------+-----------+----------+--------------+ PERO     Full                                                        +---------+---------------+---------+-----------+----------+--------------+   +---------+---------------+---------+-----------+----------+--------------+ LEFT      CompressibilityPhasicitySpontaneityPropertiesThrombus Aging +---------+---------------+---------+-----------+----------+--------------+  CFV      Full           Yes      Yes                                 +---------+---------------+---------+-----------+----------+--------------+ SFJ      Full                                                        +---------+---------------+---------+-----------+----------+--------------+ FV Prox  Full                                                        +---------+---------------+---------+-----------+----------+--------------+ FV Mid                  Yes      Yes                                 +---------+---------------+---------+-----------+----------+--------------+ FV Distal               Yes      Yes                                 +---------+---------------+---------+-----------+----------+--------------+ PFV      Full                                                        +---------+---------------+---------+-----------+----------+--------------+ POP      Full           Yes      Yes                                 +---------+---------------+---------+-----------+----------+--------------+ PTV      Full                                                        +---------+---------------+---------+-----------+----------+--------------+ PERO     Full                                                        +---------+---------------+---------+-----------+----------+--------------+     Summary: RIGHT: - There is no evidence of deep vein thrombosis in the lower extremity. However, portions of this examination were limited- see technologist comments above.  - No cystic structure found in the popliteal fossa.  LEFT: - There is no evidence of deep vein thrombosis in the lower extremity. However, portions of this examination  were limited- see technologist comments above.  - No cystic structure found in the popliteal  fossa.  *See table(s) above for measurements and observations. Electronically signed by Debby Robertson on 03/02/2024 at 2:25:20 PM.    Final    CT ABDOMEN PELVIS W CONTRAST Result Date: 03/01/2024 EXAM: CT ABDOMEN AND PELVIS WITH CONTRAST 02/29/2024 10:41:00 PM TECHNIQUE: CT of the abdomen and pelvis was performed with the administration of 75 mL of iohexol (OMNIPAQUE) 350 MG/ML injection. Multiplanar reformatted images are provided for review. Automated exposure control, iterative reconstruction, and/or weight-based adjustment of the mA/kV was utilized to reduce the radiation dose to as low as reasonably achievable. COMPARISON: CTA chest, abdomen and pelvis 02/20/2024, CT of abdomen and pelvis with contrast 08/31/2003. CLINICAL HISTORY: Intra-abdominal abscess. Chief complaints: Abdominal pain. Exploratory laparotomy on 02/23/2024. FINDINGS: LOWER CHEST: Bilateral symmetric minimal layering pleural effusions. Mild subsegmental atelectasis posterior lung bases. Lung bases are otherwise clear. The cardiac size is normal. No pneumothorax. LIVER: The liver is unremarkable. GALLBLADDER AND BILE DUCTS: The gallbladder is absent. No biliary ductal dilatation. SPLEEN: No acute abnormality. PANCREAS: Partial pancreatic atrophy. No mass or ductal dilatation. ADRENAL GLANDS: No acute abnormality. KIDNEYS, URETERS AND BLADDER: No stones in the kidneys or ureters. No hydronephrosis. No renal mass enhancement. No perinephric or periureteral stranding. Urinary bladder is unremarkable. GI AND BOWEL: The gastric wall and duodenum are unremarkable. Beginning at the ligament of Treitz, there is small bowel dilatation throughout up to 4 cm, except for normal caliber right lower quadrant segments. There are thickened and angulated segments in the mid to lower abdomen, any one of which could be transitional. The small bowel dilatation could be obstructive or due to ileus or enteritis . The appendix is normal. The wall of the colon is  unremarkable. PERITONEUM AND RETROPERITONEUM: There is scattered free intraperitoneal air anteriorly in the pelvis to the left and in the midline. This is probably related to the recent surgery. Correlate clinically to exclude perforated bowel. There is slight enhancement and thickening of the peritoneal reflections along with patchy mesenteric edema in the lower abdomen and pelvis, which also could be postsurgical or inflammatory. There is a thin-walled pelvic fluid collection in between the uterus and bladder (series 4, axial 78) measuring 8.9 x 7.3 cm and 15 Hounsfield units. . The collection appears uncomplicated. This is most likely a seroma. Early presentation of abscess is possible. There is a small amount of ascites in the perihepatic space and scattered interloop ascites in the mid and lower abdomen. VASCULATURE: Aorta is normal in caliber. LYMPH NODES: No lymphadenopathy. REPRODUCTIVE ORGANS: No acute abnormality. BONES AND SOFT TISSUES: New midline laparotomy skin staples. Small subincisional fluid collection at the superior aspect measuring 3.6 x 2.7 cm and 18 Hounsfield units, probable seroma. Just below the fluid collection, there are a few tiny subcutaneous air pockets, nonspecific and could be related to the recent closure or infectious. There is diffuse increased body wall anasarca, probably from third spacing and fluid overload. There is chronic nonerosive bilateral sacroiliitis. Degenerative change lower lumbar spine with acquired spinal stenosis of L3-L4. No acute or other significant osseous findings. IMPRESSION: 1. Small bowel dilatation up to 4 cm with thickened and angulated segments in the mid to lower abdomen that may represent transitional segments; findings may indicate small-bowel obstruction or postoperative ileus / enteritis .The small bowel is normal in caliber in the right lower quadrant. 2. Scattered free intraperitoneal air, favored postsurgical; recommend close clinical follow up  and, if indicated,  imaging follow-up to exclude bowel perforation. 3. Thin-walled pelvic fluid collection (8.9 x 7.3 cm, 15 HU) between the uterus and bladder, most consistent with a postoperative seroma; early abscess not excluded and short-interval follow-up is recommended. 4. Patchy mesenteric edema along with slightly enhancing peritoneal reflections, which could be postoperative etiology or inflammatory. 5. Diffuse increased body wall anasarca, likely from third spacing and fluid overload. 6. Subincisional air and fluid as described above. 7. Small amount of ascites. Electronically signed by: Francis Quam MD 03/01/2024 12:02 AM EDT RP Workstation: [id number]   DG Abd 1 View Result Date: 02/29/2024 CLINICAL DATA:  Abdominal pain. EXAM: ABDOMEN - 1 VIEW COMPARISON:  02/23/2024 FINDINGS: Interval evolution of gaseous small bowel distension with decreased distension in the left and mid abdomen but some gas distended small bowel in the right abdomen appears progressive, measuring 4.7 cm diameter today. NG tube is been removed in the interval. Mild gaseous distention of stomach evident. Probable gas in the cecum/ascending colon without dilatation. Gas is visible in the rectum. IMPRESSION: Interval evolution of gaseous small bowel distension with decreased distension in the left and mid abdomen but some gas distended small bowel in the right abdomen appears progressive. Imaging features may reflect evolving ileus or small bowel obstruction. Electronically Signed   By: Camellia Candle M.D.   On: 02/29/2024 06:36   US  EKG SITE RITE Result Date: 02/27/2024 If Site Rite image not attached, placement could not be confirmed due to current cardiac rhythm.  DG Abd Portable 1V Result Date: 02/23/2024 CLINICAL DATA:  Small bowel obstruction EXAM: PORTABLE ABDOMEN - 1 VIEW COMPARISON:  Prior chest x-ray yesterday 02/22/2024 FINDINGS: Persistent gaseous distension of multiple loops of small bowel in the left mid  abdomen with a maximal diameter of 4.4 cm. Findings are centrally unchanged. Gastric tube remains in good position with the tip overlying the fundus. No large free air. Multilevel degenerative disc disease. IMPRESSION: Persistent and unchanged small bowel obstruction. Maximal small bowel diameter of 4.4 cm. Gastric tube overlies the stomach. Electronically Signed   By: Wilkie Lent M.D.   On: 02/23/2024 09:07   DG Abd Portable 1V Result Date: 02/22/2024 CLINICAL DATA:  Small-bowel obstruction EXAM: PORTABLE ABDOMEN - 1 VIEW COMPARISON:  Abdominal radiograph dated 02/21/2024 FINDINGS: Gastric/enteric tube tip projects over the stomach. Slightly decreased but persistent gas-filled dilated small bowel loops within the central abdomen. Dilute contrast material is seen within left hemi abdominal bowel loops. IMPRESSION: Slightly decreased but persistent gas-filled dilated small bowel loops within the central abdomen. Electronically Signed   By: Limin  Xu M.D.   On: 02/22/2024 10:21   DG Abd Portable 1V Result Date: 02/21/2024 EXAM: 1 VIEW XRAY OF THE ABDOMEN 02/21/2024 09:30:00 AM COMPARISON: 02/20/2024 CLINICAL HISTORY: SBO (small bowel obstruction) (HCC) [id number]. Reason for exam: SBO. FINDINGS: LINES, TUBES AND DEVICES: Gastric decompression tube in place in the gastric fundus. New enteric contrast material within the gastric fundus. BOWEL: Dilation of small bowel loops within the central abdomen, up to 4.5 cm in diameter compared to 3.3 cm previously, consistent with small bowel obstruction. SOFT TISSUES: Right upper quadrant surgical clips noted. No opaque urinary calculi. BONES: No acute osseous abnormality. IMPRESSION: 1. Small bowel obstruction with progressive dilation of small bowel loops up to 4.5 cm, increased from 3.3 cm previously. 2. Gastric decompression tube terminates in the gastric fundus. Electronically signed by: Waddell Calk MD 02/21/2024 10:05 AM EDT RP Workstation: [id number]   DG  Abd Portable 1V-Small Bowel Obstruction Protocol-initial, 8  hr delay Result Date: 02/20/2024 CLINICAL DATA:  8 hour small-bowel follow up EXAM: PORTABLE ABDOMEN - 1 VIEW COMPARISON:  Film from earlier in the same day. FINDINGS: Gastric catheter is again noted in the stomach. Some retained contrast is noted in the gastric fundus. Administered contrast lies within the mildly dilated small bowel loops centrally. No significant colonic contrast is seen to suggest partial small bowel obstruction. 24 hour follow-up is recommended. IMPRESSION: Contrast is noted within dilated loops of small bowel centrally. No significant colonic contrast is seen. 24 hour follow-up film is recommended. Electronically Signed   By: Oneil Devonshire M.D.   On: 02/20/2024 19:00   DG Abd 1 View Result Date: 02/20/2024 CLINICAL DATA:  Nasogastric tube placement. EXAM: DG ABDOMEN 1V COMPARISON:  Chest, abdomen and pelvis CTA dated 02/20/2024 FINDINGS: Nasogastric tube tip and side hole in the proximal stomach. Normal bowel-gas pattern. Excreted contrast in the renal collecting systems. Lower lumbar spine degenerative changes. Cholecystectomy clips. IMPRESSION: Nasogastric tube tip and side hole in the proximal stomach. Electronically Signed   By: Elspeth Bathe M.D.   On: 02/20/2024 09:45   DG Chest Port 1 View Result Date: 02/20/2024 EXAM: 1 VIEW(S) XRAY OF THE CHEST 02/20/2024 06:01:34 AM COMPARISON: 06/05/06. CLINICAL HISTORY: abdominal pain, hypoxia. abdominal pain, hypoxia; rover FINDINGS: LUNGS AND PLEURA: No focal pulmonary opacity. No pulmonary edema. No pleural effusion. No pneumothorax. HEART AND MEDIASTINUM: No acute abnormality of the cardiac and mediastinal silhouettes. BONES AND SOFT TISSUES: No acute osseous abnormality. VISUALIZED UPPER ABDOMEN: Gaseous distention of stomach partially visible. IMPRESSION: 1. No acute cardiopulmonary process. Electronically signed by: Waddell Calk MD 02/20/2024 06:25 AM EDT RP Workstation:  [id number]   CT Angio Chest/Abd/Pel for Dissection W and/or W/WO Result Date: 02/20/2024 EXAM: CT CHEST, ABDOMEN AND PELVIS WITH AND WITHOUT CONTRAST 02/20/2024 03:50:09 AM TECHNIQUE: CT of the chest, abdomen and pelvis was performed with and without the administration of intravenous contrast. Multiplanar reformatted images are provided for review. Automated exposure control, iterative reconstruction, and/or weight based adjustment of the mA/kV was utilized to reduce the radiation dose to as low as reasonably achievable. COMPARISON: None available. CLINICAL HISTORY: Acute aortic syndrome (AAS) suspected; severe bilateral lower abdominal pain, history of factor V leiden. Patient complains of sudden onset abdominal pain with radiation to both sides of lower abdomen and diarrhea x 2-3 hours ago, denies nausea/vomiting. FINDINGS: CHEST: MEDIASTINUM AND LYMPH NODES: No intracranial hematoma, dissection, or aneurysm. No significant atherosclerotic calcification. LUNGS AND PLEURA: No pulmonary embolism to the segmental level. Central pulmonary arteries are of normal caliber. No pleural effusion or pneumothorax. ABDOMEN AND PELVIS: LIVER: The liver is unremarkable. GALLBLADDER AND BILE DUCTS: Status post cholecystectomy. No biliary ductal dilatation. SPLEEN: No acute abnormality. PANCREAS: No acute abnormality. ADRENAL GLANDS: No acute abnormality. KIDNEYS, URETERS AND BLADDER: No stones in the kidneys or ureters. No hydronephrosis. No perinephric or periureteral stranding. Urinary bladder is unremarkable. GI AND BOWEL: There is a high-grade mid small bowel obstruction within the right hemipelvis. The small bowel proximal to this point is fluid-filled and dilated up to 3 cm in diameter. Distally, the bowel is decompressed as is the colon. Appendix is absent. REPRODUCTIVE ORGANS: No acute abnormality. PERITONEUM AND RETROPERITONEUM: Mild ascites. No free intraperitoneal gas. VASCULATURE: Thoracoabdominal aorta is normal  in course and caliber. ABDOMINAL AND PELVIS LYMPH NODES: No lymphadenopathy. BONES AND SOFT TISSUES: Osseous structures are age appropriate. No acute bone abnormality. No lytic or blastic bone lesion. IMPRESSION: 1. No evidence of acute aortic  syndrome. 2. No pulmonary embolism to the segmental level. 3. High-grade mid small bowel obstruction within the right hemipelvis with proximal small bowel dilation up to 3 cm and decompressed distal bowel and colon. No free intraperitoneal gas. Mild ascites. Electronically signed by: Dorethia Molt MD 02/20/2024 04:02 AM EDT RP Workstation: [id number]    Labs:  CBC: Recent Labs    03/04/24 1130 03/05/24 0845 03/07/24 0543 03/08/24 1032  WBC 11.5* 9.8 7.6 7.7  HGB 10.4* 9.7* 9.1* 10.3*  HCT 32.7* 30.4* 28.7* 33.0*  PLT 310 348 323 376    COAGS: Recent Labs    03/05/24 1207  INR 1.0    BMP: Recent Labs    03/06/24 0846 03/07/24 0543 03/08/24 1032 03/12/24 0510  NA 137 138 138 139  K 3.7 3.2* 4.0 3.7  CL 104 105 104 104  CO2 23 24 25 24   GLUCOSE 138* 160* 195* 125*  BUN 7* 8 6* 7*  CALCIUM 8.0* 7.9* 8.4* 8.5*  CREATININE 0.74 0.73 0.72 0.62  GFRNONAA >60 >60 >60 >60    LIVER FUNCTION TESTS: Recent Labs    03/01/24 0358 03/05/24 0845 03/08/24 1032 03/12/24 0510  BILITOT 0.4 0.5 0.4 0.6  AST 22 20 15  12*  ALT 24 20 19 12   ALKPHOS 88 103 95 66  PROT 4.1* 4.1* 5.4* 5.1*  ALBUMIN 1.6* 2.0* 2.4* 2.4*    TUMOR MARKERS: No results for input(s): AFPTM, CEA, CA199, CHROMGRNA in the last 8760 hours.  Assessment and Plan:  68 year old female with a history of Postoperative pelvic abscess status post percutaneous drain placement now demonstrates complete resolution of the abscess cavity and associated fistula.  The drainage catheter was removed.  No further scheduled follow-up.  Electronically Signed: Wilkie MARLA Lent 03/18/2024, 11:20 AM   I spent a total of  15 Minutes in face to face in clinical consultation,  greater than 50% of which was counseling/coordinating care for abscess drain in place.

## 2024-03-18 NOTE — Assessment & Plan Note (Signed)
 Gained strength in rehab  Improved and at home  Gradually advancing activity

## 2024-03-18 NOTE — Patient Instructions (Addendum)
 Let's check for c diff   Schedule non fasting labs at 2-3 weeks   Flu shot today   Stay hydrated Keep working on protein intake (small meals and snacks are fine)    Please call if any new issues or worsening symptoms

## 2024-03-18 NOTE — Assessment & Plan Note (Addendum)
 Reviewed hospital records, lab results and studies in detail  Much improvement now home from rehab Eating protein in small servings  Continues some diarrhea / c diff testing sent   Plan labs 2-3 weeks for cbc/chem  Anemic post surg

## 2024-03-18 NOTE — Assessment & Plan Note (Signed)
 bp in fair control at this time  BP Readings from Last 1 Encounters:  03/18/24 132/80   No changes needed Most recent labs reviewed  Disc lifstyle change with low sodium diet and exercise  Lisinopril 10 mg daily Metoprolol  xl 25 mg daily

## 2024-03-18 NOTE — Assessment & Plan Note (Signed)
 S/p sbo and then ileus  Taking augmentin for pelvic abscess  Reviewed hospital records, lab results and studies in detail    C diff test sent Taking fiber to bulk up stool Staying hydrated Reassuring exam

## 2024-03-25 ENCOUNTER — Other Ambulatory Visit

## 2024-03-26 ENCOUNTER — Ambulatory Visit: Attending: Student

## 2024-03-26 ENCOUNTER — Other Ambulatory Visit: Payer: Self-pay

## 2024-03-26 DIAGNOSIS — R5381 Other malaise: Secondary | ICD-10-CM | POA: Diagnosis not present

## 2024-03-26 DIAGNOSIS — M6281 Muscle weakness (generalized): Secondary | ICD-10-CM | POA: Insufficient documentation

## 2024-03-26 DIAGNOSIS — R6889 Other general symptoms and signs: Secondary | ICD-10-CM | POA: Insufficient documentation

## 2024-03-26 NOTE — Therapy (Signed)
 OUTPATIENT PHYSICAL THERAPY LOWER EXTREMITY EVALUATION   Patient Name: Kristine [name] MRN: [id number] DOB:May 15, 1955, 68 y.o., female Today's Date: 03/27/2024  END OF SESSION:  PT End of Session - 03/27/24 2113     Visit Number 1    Number of Visits 16    Date for Recertification  05/26/24    Authorization Type Medicare A and B    PT Start Time 1145    PT Stop Time 1230    PT Time Calculation (min) 45 min    Activity Tolerance Patient tolerated treatment well          Past Medical History:  Diagnosis Date   Allergy    Anxiety    Arthritis 2025   Toe/knee   Basal cell carcinoma    face   Cataract 2019   Clotting disorder 2009   Factor 5   Depression 1989   DVT (deep venous thrombosis) (HCC) 05/13/2006   Esophageal erosions    Factor V Leiden    On coag x 1 year ending in 09   Gastritis 05/13/1988   hospital- abd pain   GERD (gastroesophageal reflux disease)    Hx of adenomatous polyp of colon 12/06/2022   5 mm rectal adenoma repeat colonoscopy 2031    Hyperlipidemia    Hypertension    Radial head fracture 05/13/2009   Past Surgical History:  Procedure Laterality Date   BIOPSY BREAST Right 10/2022   BREAST SURGERY  2006   Ductal Papilloma/2023 breast biopsy-benign/2024 breast biopsy -benign   CESAREAN SECTION  1987   CHOLECYSTECTOMY  01/27/2003   COLONOSCOPY     DILATION AND CURETTAGE OF UTERUS  08/22/2022   IR CV LINE INJECTION  03/10/2024   IR RADIOLOGIST EVAL & MGMT  03/18/2024   LAPAROSCOPY N/A 02/23/2024   Procedure: LAPAROSCOPY, DIAGNOSTIC;  Surgeon: Vernetta Berg, MD;  Location: [facility] OR;  Service: General;  Laterality: N/A;   LAPAROTOMY N/A 02/23/2024   Procedure: LAPAROTOMY, EXPLORATORY;  Surgeon: Vernetta Berg, MD;  Location: [facility] OR;  Service: General;  Laterality: N/A;   MOHS SURGERY  11/10/2009   basal cell carcinoma near eye   OOPHORECTOMY     SHOULDER SURGERY  04/12/2006   SMALL INTESTINE SURGERY  02/23/2024   Small bowel  obstruction   TONSILLECTOMY  05/13/1974   UPPER GASTROINTESTINAL ENDOSCOPY     Patient Active Problem List   Diagnosis Date Noted   Diarrhea 03/18/2024   H/O drainage of abscess 03/10/2024   Mild peripheral edema 03/10/2024   Ileus, postoperative (HCC) 03/10/2024   Pelvic abscess in female 03/08/2024   Debility 03/07/2024   Moderate protein-calorie malnutrition 02/27/2024   SBO (small bowel obstruction) (HCC) 02/20/2024   Back pain 12/23/2023   Adverse effect of metformin  09/30/2023   Hx of adenomatous polyp of colon 12/06/2022   Arm vein blood clot, right 12/26/2020   Estrogen deficiency 10/24/2020   History of pulmonary hypertension 06/08/2019   History of shingles 01/21/2018   Prediabetes 04/21/2015   Stress reaction 04/21/2015   Essential hypertension 04/21/2015   Migraine with aura 06/21/2013   Obesity 06/16/2011   Routine general medical examination at a health care facility 06/14/2011   SCIATICA 05/22/2009   GASTROESOPHAGEAL REFLUX DISEASE 04/14/2009   Factor 5 Leiden mutation, heterozygous 12/07/2007   Depression with anxiety 11/25/2006   IBS 11/25/2006   DVT, HX OF 11/25/2006   MIGRAINES, HX OF 11/25/2006   HYPERCHOLESTEROLEMIA, PURE 10/29/2006    PCP: [name] LABOR, MD PCP - General  REFERRING PROVIDER: Jerilynn Daphne SAILOR, NP Ref Provider   REFERRING DIAG: R53.81 (ICD-10-CM) - Debility   THERAPY DIAG:  Muscle weakness (generalized)  Decreased activity tolerance  Rationale for Evaluation and Treatment: rehabilitation  ONSET DATE: October 9th to 31st (hospitilization for obstruction and subsequent surgeries)  SUBJECTIVE:   SUBJECTIVE STATEMENT: 22 days in hospital due to bowel obstruction and subsequent surgery. Lower body feels weaker than upper body. Some pain still with open incision site that is healing.   PERTINENT HISTORY: 2 knees with OA, open wound healing on abdomen with pain. Orthopedist says no squats or lunges reportedly due to BL  knees.    PAIN:  Are you having pain? No  PRECAUTIONS: None  RED FLAGS: None   WEIGHT BEARING RESTRICTIONS: No  FALLS:  Has patient fallen in last 6 months? No  OCCUPATION: n/a  PLOF: Independent  PATIENT GOALS: improve ADL completion, HEP  OBJECTIVE:  Note: Objective measures were completed at Evaluation unless otherwise noted.   PATIENT SURVEYS:  PSFS: THE PATIENT SPECIFIC FUNCTIONAL SCALE  Place score of 0-10 (0 = unable to perform activity and 10 = able to perform activity at the same level as before injury or problem)  Activity Date: 03/26/24    Walk long distances 5    2. Stand for extended period 5    3. Climb more than one set of home stairs 5    4.      Total Score 15      Total Score = Sum of activity scores/number of activities  Minimally Detectable Change: 3 points (for single activity); 2 points (for average score)  [facility] (nd). The Patient Specific Functional Scale . Retrieved from [email or web address]   COGNITION: Overall cognitive status: Within functional limits for tasks assessed     SENSATION: WFL   POSTURE: rounded shoulders, forward head, and increased lumbar lordosis  PALPATION: N/a   UE ROM: BL UE WFL  MMT 4- grossly  LOWER EXTREMITY ROM:  Active ROM Right eval Left eval  Hip flexion wfl wfl  Hip extension    Hip abduction wfl wfl  Hip adduction wfl wfl  Hip internal rotation    Hip external rotation    Knee flexion wfl wfl  Knee extension wfl wfl  Ankle dorsiflexion    Ankle plantarflexion    Ankle inversion    Ankle eversion     (Blank rows = not tested)  LOWER EXTREMITY MMT:  MMT Right eval Left eval  Hip flexion 4- 4-  Hip extension    Hip abduction 4- 4-  Hip adduction 4- 4-  Hip internal rotation    Hip external rotation    Knee flexion 4- 4-  Knee extension 4 4  Ankle dorsiflexion    Ankle plantarflexion    Ankle inversion     Ankle eversion     (Blank rows = not tested)  FUNCTIONAL TESTS  5x STS: 13.41 5x STS w/ sitting back down at end (using BL UE for support)  TREATMENT DATE:   TREATMENT 03/26/2024:  Therapeutic Exercise: RTB clamshell x8x3s RTB Hip marches x8x3s Seated LAQ 2x6x3s     Self-care/Home Management: Patient educated on HEP, POC, prognosis, and relevant tissues/anatomy.     PATIENT EDUCATION:  Education details: HEP Person educated: Patient Education method: Solicitor, and Handouts Education comprehension: verbalized understanding and returned demonstration  HOME EXERCISE PROGRAM: 5x/wk, 2x/day RTB clamshell x8x3s RTB Hip marches x8x3s Seated LAQ 2x6x3s   ASSESSMENT:  CLINICAL IMPRESSION: EVAL: Patient is a 68 year old female who presents with general whole body weakness due to 22 day hospital day due to bowel obstruction and subsequent surgeries. Patient presents with deficits in: general body strength (BL LE > BL UE) and functional activity tolerance. As a result, the patient would benefit from skilled PT to address aforementioned deficits via plan below.   OBJECTIVE IMPAIRMENTS: decreased activity tolerance, decreased balance, difficulty walking, decreased strength, improper body mechanics, postural dysfunction, obesity, and pain.   ACTIVITY LIMITATIONS: carrying, lifting, standing, squatting, and stairs  PERSONAL FACTORS: Age, Fitness, Past/current experiences, Time since onset of injury/illness/exacerbation, and 1-2 comorbidities:   are also affecting patient's functional outcome.   REHAB POTENTIAL: Fair    CLINICAL DECISION MAKING: Stable/uncomplicated  EVALUATION COMPLEXITY: Moderate   GOALS: Goals reviewed with patient? No  SHORT TERM GOALS: Target date: 04/17/2024   1) Patient will demonstrate 75% HEP compliance  to show independence with self-management of condition   Baseline: 0% Goal status: INITIAL     LONG TERM GOALS: Target date: 05/26/24   1) Patient will demonstrate 100% HEP compliance to show independence with self-management of condition   Baseline: 0% Goal status: INITIAL    3) Patient will demonstrate a 7 point improvement in PSFS to show improvements in ADL completion and overall QOL    Baseline: 15 Goal status: INITIAL  4) Patient will be able to perform ADLs with at least 85% capacity to demonstrate improvements in overall functional activity tolerance, strength, and QOL    Baseline: 20% Goal status: INITIAL      PLAN:  PT FREQUENCY: 1-2x/week  PT DURATION: 8 weeks  PLANNED INTERVENTIONS: 97110-Therapeutic exercises, 97530- Therapeutic activity, 97112- Neuromuscular re-education, 97535- Self Care, [id number]- Manual therapy, and Patient/Family education  PLAN FOR NEXT SESSION: HEP assessment and progression, symptom modulation, and loading (isolated and/or functional). Manual therapy, aerobic, gait, and NME training as needed. Functional strengthening (lower body> upper body) as tolerated based on sx.    Washington Greener Reina Wilton  PT, DPT  03/27/2024, 9:24 PM

## 2024-03-27 NOTE — Progress Notes (Unsigned)
 Cardiology Office Note   Date:  03/30/2024  ID:  Kristine [name], Kristine [name] 08/27/1955, MRN [id number]  PCP:  [name] LABOR, MD  Cardiologist:   Vina Gull, MD  Patient presents for f/u of paliptations and lipids    History of Present Illness: Kristine [name] is a 68 y.o. female with a history of palpitations, DVT, Facttor V Leiden mutation, GERD, HTN, HL    2017  Echo normal   Myovue normal  Rx with Toprol  for palpitations= 2019 Echo  LVEF and RVEF were normal RV and RA normal in sze  PAP 47   IVC dilated Repeat echo IVC normalized and PAP improved    She watched salt  2021  Calcium score CT   Score:   0    Mild plaqung  at the aortic root   2021  Echo LVEF normal     Diet Breakfast:(7-8:30)   one egg     1/2 english muffin   Coffee/cream Lunch  1/2 turkey sandwich and water    Curator (7-8:30)Turkey on grill Squash   Zucchini   Butter beans     Water Snacks   Yogurt  I saw the pt in 2023    Seen by CHRISTELLA Pavy in 2024 as part of a preop assessment   The pt was hospitalized (10/9-10/26/25 with rehab hosp to 10/29)  for SBO   On 02/23/24 underwent ex lap with repair.  Post op ileus, infection, hypokalemia, anasarca  She says she is doing OK  Tired since discharge   Staying at home   Trying to get more protein in    She deniesCP  Breathing is OK   No palpitations   Outpatient Medications Prior to Visit  Medication Sig Dispense Refill   acetaminophen (TYLENOL) 500 MG tablet Take 2 tablets (1,000 mg total) by mouth every 6 (six) hours as needed for mild pain (pain score 1-3) or headache. 30 tablet 0   ALPRAZolam  (XANAX ) 0.25 MG tablet Take 1 tablet (0.25 mg total) by mouth at bedtime. 30 tablet 0   amoxicillin-clavulanate (AUGMENTIN) 875-125 MG tablet Take 1 tablet by mouth every 12 (twelve) hours. 60 tablet 0   aspirin EC 81 MG tablet Take 1 tablet (81 mg total) by mouth daily. Swallow whole. 30 tablet 12   bisacodyl (DULCOLAX) 10 MG suppository Place 1  suppository (10 mg total) rectally daily as needed for moderate constipation. 12 suppository 0   buPROPion  (WELLBUTRIN  XL) 300 MG 24 hr tablet Take 1 tablet (300 mg total) by mouth daily. TAKE 1 TABLET BY MOUTH EVERY DAY 90 tablet 3   Cetirizine HCl (ZYRTEC PO) Take 1 tablet by mouth daily as needed (for allergies).     Cholecalciferol (VITAMIN D3 PO) Take 5,000 Units by mouth daily.     diphenoxylate-atropine (LOMOTIL) 2.5-0.025 MG tablet Take 2 tablets by mouth 4 (four) times daily as needed for diarrhea or loose stools. 30 tablet 0   fluconazole (DIFLUCAN) 150 MG tablet Take 1 tablet (150 mg total) by mouth once as needed for up to 1 dose. 1 tablet 0   fluticasone  (FLONASE ) 50 MCG/ACT nasal spray PLACE 2 SPRAYS INTO THE NOSE DAILY AS NEEDED 48 g 3   Lactobacillus (ACIDOPHILUS) CAPS capsule Take 1 capsule by mouth daily. 30 capsule 0   metFORMIN  (GLUCOPHAGE -XR) 500 MG 24 hr tablet Take 2 tablets (1,000 mg total) by mouth daily. 180 tablet 1   methocarbamol (ROBAXIN) 500 MG tablet Take  1 tablet (500 mg total) by mouth every 8 (eight) hours as needed for muscle spasms. 30 tablet 0   psyllium (HYDROCIL/METAMUCIL) 95 % PACK Take 1 packet by mouth 2 (two) times daily. 240 each 0   furosemide  (LASIX ) 20 MG tablet Take 1 tablet by mouth daily as needed or twice weekly as needed for swelling 30 tablet 0   lisinopril (ZESTRIL) 10 MG tablet Take 1 tablet (10 mg total) by mouth daily. 30 tablet 0   metoprolol  succinate (TOPROL -XL) 25 MG 24 hr tablet Take 1 tablet (25 mg total) by mouth daily. Take with or immediately following a meal. 90 tablet 3   pantoprazole (PROTONIX) 40 MG tablet Take 1 tablet (40 mg total) by mouth daily before breakfast. 30 tablet 0   potassium chloride  (KLOR-CON ) 10 MEQ tablet Take 1 tablet (10 mEq total) by mouth daily. AND AS NEEDED WITH YOUR LASIX  (Patient taking differently: Take 10 mEq by mouth See admin instructions. Take 1 tablet by mouth along with Lasix  as needed for swelling)  90 tablet 3   simvastatin  (ZOCOR ) 10 MG tablet TAKE 1 TABLET BY MOUTH EVERY DAY 90 tablet 2   No facility-administered medications prior to visit.     Allergies:   Oxycodone-acetaminophen, Paroxetine, Sulfa antibiotics, Amoxicillin-pot clavulanate, Calcium, Erythromycin, Guaifenesin, Naproxen, Oxycodone-aspirin, and Sertraline   Past Medical History:  Diagnosis Date   Allergy    Anxiety    Arthritis 2025   Toe/knee   Basal cell carcinoma    face   Cataract 2019   Clotting disorder 2009   Factor 5   Depression 1989   DVT (deep venous thrombosis) (HCC) 05/13/2006   Esophageal erosions    Factor V Leiden    On coag x 1 year ending in 09   Gastritis 05/13/1988   hospital- abd pain   GERD (gastroesophageal reflux disease)    Hx of adenomatous polyp of colon 12/06/2022   5 mm rectal adenoma repeat colonoscopy 2031    Hyperlipidemia    Hypertension    Radial head fracture 05/13/2009    Past Surgical History:  Procedure Laterality Date   BIOPSY BREAST Right 10/2022   BREAST SURGERY  2006   Ductal Papilloma/2023 breast biopsy-benign/2024 breast biopsy -benign   CESAREAN SECTION  1987   CHOLECYSTECTOMY  01/27/2003   COLONOSCOPY     DILATION AND CURETTAGE OF UTERUS  08/22/2022   IR CV LINE INJECTION  03/10/2024   IR RADIOLOGIST EVAL & MGMT  03/18/2024   LAPAROSCOPY N/A 02/23/2024   Procedure: LAPAROSCOPY, DIAGNOSTIC;  Surgeon: Vernetta Berg, MD;  Location: [facility];  Service: General;  Laterality: N/A;   LAPAROTOMY N/A 02/23/2024   Procedure: LAPAROTOMY, EXPLORATORY;  Surgeon: Vernetta Berg, MD;  Location: [facility];  Service: General;  Laterality: N/A;   MOHS SURGERY  11/10/2009   basal cell carcinoma near eye   OOPHORECTOMY     SHOULDER SURGERY  04/12/2006   SMALL INTESTINE SURGERY  02/23/2024   Small bowel obstruction   TONSILLECTOMY  05/13/1974   UPPER GASTROINTESTINAL ENDOSCOPY       Social History:  The patient  reports that she has never smoked. She has  never used smokeless tobacco. She reports that she does not currently use alcohol after a past usage of about 2.0 standard drinks of alcohol per week. She reports that she does not use drugs.   Family History:  The patient's family history includes Anxiety disorder in her maternal aunt; Asthma in her daughter; Atrial fibrillation  in her brother; Breast cancer in her sister; COPD in her maternal uncle; Cancer in her brother and sister; Colon polyps in her brother; Coronary artery disease in her brother; Depression in her mother; Diabetes in her brother, father, paternal grandfather, paternal uncle, paternal uncle, and paternal uncle; Graves' disease in her mother; Hearing loss in her mother; Heart attack in her brother and father; Heart disease in her brother, brother, father, maternal uncle, maternal uncle, paternal grandmother, paternal uncle, paternal uncle, paternal uncle, paternal uncle, and paternal uncle; Hypertension in her brother, brother, brother, father, maternal aunt, maternal aunt, maternal grandfather, maternal grandmother, maternal uncle, mother, paternal grandfather, paternal grandmother, paternal uncle, paternal uncle, paternal uncle, paternal uncle, paternal uncle, sister, and sister; Kidney disease in her paternal grandfather; Obesity in her brother and paternal grandfather; Osteoporosis in her mother; Stroke in her maternal aunt, maternal aunt, maternal grandfather, maternal grandmother, and mother; Varicose Veins in her mother and sister; Vision loss in her father and mother.    ROS:  Please see the history of present illness. All other systems are reviewed and  Negative to the above problem except as noted.    PHYSICAL EXAM: VS:  BP 132/80 (BP Location: Left Arm, Patient Position: Sitting, Cuff Size: Normal)   Pulse 74   Ht 5' 4.5 (1.638 m)   Wt 176 lb 3.2 oz (79.9 kg)   SpO2 99%   BMI 29.78 kg/m   HZW:Nczmtzphyu 68 yo in no acute distress  HEENT: normal  Neck: no JVD,  carotid bruit Cardiac: RRR; no murmur Respiratory:  clear to auscultation  GI: Deferred  Ext  Triv LE edema  EKG:  EKG is  ordered today  SR 64 bpm   Ca score CT  (04/04/20)   Ca score 0   Scattered calcifications of aortic root    Echo  03/2020  1. Left ventricular ejection fraction by 3D volume is 67 %. The left ventricle has normal function. The left ventricle has no regional wall motion abnormalities. Left ventricular diastolic parameters were normal. The average left ventricular global longitudinal strain is -24.3 %. The global longitudinal strain is normal. 2. Right ventricular systolic function is normal. The right ventricular size is normal. There is normal pulmonary artery systolic pressure. The estimated right ventricular systolic pressure is 33.0 mmHg. 3. Left atrial size was borderline dilated. 4. The mitral valve is normal in structure. Trivial mitral valve regurgitation. No evidence of mitral stenosis. 5. The aortic valve is tricuspid. Aortic valve regurgitation is not visualized. No aortic stenosis is present. 6. The inferior vena cava is normal in size with greater than 50% respiratory variability, suggesting right atrial pressure of 3 mmHg. Lipid Panel    Component Value Date/Time   CHOL 188 09/30/2023 1027   TRIG 121 03/01/2024 0358   HDL 79.10 09/30/2023 1027   CHOLHDL 2 09/30/2023 1027   VLDL 16.0 09/30/2023 1027   LDLCALC 93 09/30/2023 1027   LDLDIRECT 146.1 08/19/2006 0935      Wt Readings from Last 3 Encounters:  03/30/24 176 lb 3.2 oz (79.9 kg)  03/18/24 176 lb (79.8 kg)  03/07/24 197 lb 5 oz (89.5 kg)      ASSESSMENT AND PLAN:  1   Hx palpitations  Pt denies     2 HL   Pt on simvistatin   Last lipids in May 2025 LDLD 93  HDL 79  TRig 121   Follow next year  Ca score 0 in 2021    3  Metabolics  A1C in Aug 5.8   Will need to be followed long term    Her diet is changing  Trying to get protein in    Reviewed diet with her     4 GI  Pt  recovering from recent GI surgery   Slowly improving     Current medicines are reviewed at length with the patient today.  The patient does not have concerns regarding medicines.  Signed, Vina Gull, MD

## 2024-03-30 ENCOUNTER — Encounter: Payer: Self-pay | Admitting: Internal Medicine

## 2024-03-30 ENCOUNTER — Ambulatory Visit: Attending: Internal Medicine | Admitting: Internal Medicine

## 2024-03-30 VITALS — BP 132/80 | HR 74 | Ht 64.5 in | Wt 176.2 lb

## 2024-03-30 DIAGNOSIS — R002 Palpitations: Secondary | ICD-10-CM | POA: Insufficient documentation

## 2024-03-30 MED ORDER — SIMVASTATIN 10 MG PO TABS: 10.0000 mg | ORAL_TABLET | Freq: Every day | ORAL | 3 refills | Status: AC

## 2024-03-30 MED ORDER — LISINOPRIL 10 MG PO TABS: 10.0000 mg | ORAL_TABLET | Freq: Every day | ORAL | 3 refills | Status: AC

## 2024-03-30 MED ORDER — FUROSEMIDE 20 MG PO TABS: 20.0000 mg | ORAL_TABLET | ORAL | 3 refills | Status: AC

## 2024-03-30 MED ORDER — PANTOPRAZOLE SODIUM 40 MG PO TBEC: 40.0000 mg | DELAYED_RELEASE_TABLET | Freq: Every day | ORAL | 3 refills | Status: AC

## 2024-03-30 MED ORDER — POTASSIUM CHLORIDE ER 10 MEQ PO TBCR: 10.0000 meq | EXTENDED_RELEASE_TABLET | Freq: Every day | ORAL | 3 refills | Status: AC

## 2024-03-30 MED ORDER — METOPROLOL SUCCINATE ER 25 MG PO TB24: 25.0000 mg | ORAL_TABLET | Freq: Every day | ORAL | 3 refills | Status: AC

## 2024-03-30 NOTE — Patient Instructions (Addendum)
 Medication Instructions:  Continue all medications *If you need a refill on your cardiac medications before your next appointment, please call your pharmacy*  Lab Work: None ordered  Testing/Procedures: None ordered  Follow-Up: At [facility], you and your health needs are our priority.  As part of our continuing mission to provide you with exceptional heart care, our providers are all part of one team.  This team includes your primary Cardiologist (physician) and Advanced Practice Providers or APPs (Physician Assistants and Nurse Practitioners) who all work together to provide you with the care you need, when you need it.  Your next appointment:  9 months   Call in April to schedule August appointment     Provider:  Dr.Ross    We recommend signing up for the patient portal called MyChart.  Sign up information is provided on this After Visit Summary.  MyChart is used to connect with patients for Virtual Visits (Telemedicine).  Patients are able to view lab/test results, encounter notes, upcoming appointments, etc.  Non-urgent messages can be sent to your provider as well.   To learn more about what you can do with MyChart, go to [email or web address].

## 2024-04-01 ENCOUNTER — Telehealth: Payer: Self-pay | Admitting: Physical Therapy

## 2024-04-02 ENCOUNTER — Other Ambulatory Visit (INDEPENDENT_AMBULATORY_CARE_PROVIDER_SITE_OTHER)

## 2024-04-02 DIAGNOSIS — K56609 Unspecified intestinal obstruction, unspecified as to partial versus complete obstruction: Secondary | ICD-10-CM

## 2024-04-02 DIAGNOSIS — I10 Essential (primary) hypertension: Secondary | ICD-10-CM | POA: Diagnosis not present

## 2024-04-02 DIAGNOSIS — Z9889 Other specified postprocedural states: Secondary | ICD-10-CM

## 2024-04-02 DIAGNOSIS — R197 Diarrhea, unspecified: Secondary | ICD-10-CM

## 2024-04-02 LAB — BASIC METABOLIC PANEL WITH GFR
BUN: 10 mg/dL (ref 6–23)
CO2: 30 meq/L (ref 19–32)
Calcium: 9.5 mg/dL (ref 8.4–10.5)
Chloride: 103 meq/L (ref 96–112)
Creatinine, Ser: 0.65 mg/dL (ref 0.40–1.20)
GFR: 89.72 mL/min (ref >60.00)
Glucose, Bld: 97 mg/dL (ref 70–99)
Potassium: 4.2 meq/L (ref 3.5–5.1)
Sodium: 140 meq/L (ref 135–145)

## 2024-04-02 LAB — CBC WITH DIFFERENTIAL/PLATELET
Basophils Absolute: 0.1 K/uL (ref 0.0–0.1)
Basophils Relative: 1 % (ref 0.0–3.0)
Eosinophils Absolute: 0.4 K/uL (ref 0.0–0.7)
Eosinophils Relative: 4.2 % (ref 0.0–5.0)
HCT: 37.8 % (ref 36.0–46.0)
Hemoglobin: 12.5 g/dL (ref 12.0–15.0)
Lymphocytes Relative: 26.7 % (ref 12.0–46.0)
Lymphs Abs: 2.3 K/uL (ref 0.7–4.0)
MCHC: 33 g/dL (ref 30.0–36.0)
MCV: 85.1 fl (ref 78.0–100.0)
Monocytes Absolute: 0.7 K/uL (ref 0.1–1.0)
Monocytes Relative: 7.7 % (ref 3.0–12.0)
Neutro Abs: 5.2 K/uL (ref 1.4–7.7)
Neutrophils Relative %: 60.4 % (ref 43.0–77.0)
Platelets: 226 K/uL (ref 150.0–400.0)
RBC: 4.44 Mil/uL (ref 3.87–5.11)
RDW: 13.4 % (ref 11.5–15.5)
WBC: 8.6 K/uL (ref 4.0–10.5)

## 2024-04-02 LAB — HEPATIC FUNCTION PANEL
ALT: 12 U/L (ref 0–35)
AST: 14 U/L (ref 0–37)
Albumin: 4.2 g/dL (ref 3.5–5.2)
Alkaline Phosphatase: 73 U/L (ref 39–117)
Bilirubin, Direct: 0.1 mg/dL (ref 0.0–0.3)
Total Bilirubin: 0.6 mg/dL (ref 0.2–1.2)
Total Protein: 6.7 g/dL (ref 6.0–8.3)

## 2024-04-04 ENCOUNTER — Ambulatory Visit: Payer: Self-pay | Admitting: Family Medicine

## 2024-04-05 ENCOUNTER — Encounter: Payer: Self-pay | Admitting: Family Medicine

## 2024-04-13 ENCOUNTER — Encounter: Payer: Self-pay | Admitting: Physical Therapy

## 2024-04-13 ENCOUNTER — Ambulatory Visit: Attending: Student | Admitting: Physical Therapy

## 2024-04-13 DIAGNOSIS — R6889 Other general symptoms and signs: Secondary | ICD-10-CM | POA: Insufficient documentation

## 2024-04-13 DIAGNOSIS — M6281 Muscle weakness (generalized): Secondary | ICD-10-CM | POA: Insufficient documentation

## 2024-04-13 DIAGNOSIS — R5381 Other malaise: Secondary | ICD-10-CM | POA: Insufficient documentation

## 2024-04-13 NOTE — Therapy (Signed)
 OUTPATIENT PHYSICAL THERAPY LOWER EXTREMITY TREATMENT   Patient Name: Kristine [name] MRN: [id number] DOB:Feb 19, 1956, 68 y.o., female Today's Date: 04/13/2024  END OF SESSION:  PT End of Session - 04/13/24 0937     Visit Number 2    Number of Visits 16    Date for Recertification  05/26/24    Authorization Type Medicare A and B    PT Start Time 0933    PT Stop Time 1015    PT Time Calculation (min) 42 min          Past Medical History:  Diagnosis Date   Allergy    Anxiety    Arthritis 2025   Toe/knee   Basal cell carcinoma    face   Cataract 2019   Clotting disorder 2009   Factor 5   Depression 1989   DVT (deep venous thrombosis) (HCC) 05/13/2006   Esophageal erosions    Factor V Leiden    On coag x 1 year ending in 09   Gastritis 05/13/1988   hospital- abd pain   GERD (gastroesophageal reflux disease)    Hx of adenomatous polyp of colon 12/06/2022   5 mm rectal adenoma repeat colonoscopy 2031    Hyperlipidemia    Hypertension    Radial head fracture 05/13/2009   Past Surgical History:  Procedure Laterality Date   BIOPSY BREAST Right 10/2022   BREAST SURGERY  2006   Ductal Papilloma/2023 breast biopsy-benign/2024 breast biopsy -benign   CESAREAN SECTION  1987   CHOLECYSTECTOMY  01/27/2003   COLONOSCOPY     DILATION AND CURETTAGE OF UTERUS  08/22/2022   IR CV LINE INJECTION  03/10/2024   IR RADIOLOGIST EVAL & MGMT  03/18/2024   LAPAROSCOPY N/A 02/23/2024   Procedure: LAPAROSCOPY, DIAGNOSTIC;  Surgeon: Vernetta Berg, MD;  Location: [facility] OR;  Service: General;  Laterality: N/A;   LAPAROTOMY N/A 02/23/2024   Procedure: LAPAROTOMY, EXPLORATORY;  Surgeon: Vernetta Berg, MD;  Location: [facility] OR;  Service: General;  Laterality: N/A;   MOHS SURGERY  11/10/2009   basal cell carcinoma near eye   OOPHORECTOMY     SHOULDER SURGERY  04/12/2006   SMALL INTESTINE SURGERY  02/23/2024   Small bowel obstruction   TONSILLECTOMY  05/13/1974   UPPER  GASTROINTESTINAL ENDOSCOPY     Patient Active Problem List   Diagnosis Date Noted   Diarrhea 03/18/2024   H/O drainage of abscess 03/10/2024   Mild peripheral edema 03/10/2024   Ileus, postoperative (HCC) 03/10/2024   Pelvic abscess in female 03/08/2024   Debility 03/07/2024   Moderate protein-calorie malnutrition 02/27/2024   SBO (small bowel obstruction) (HCC) 02/20/2024   Back pain 12/23/2023   Adverse effect of metformin  09/30/2023   Hx of adenomatous polyp of colon 12/06/2022   Arm vein blood clot, right 12/26/2020   Estrogen deficiency 10/24/2020   History of pulmonary hypertension 06/08/2019   History of shingles 01/21/2018   Prediabetes 04/21/2015   Stress reaction 04/21/2015   Essential hypertension 04/21/2015   Migraine with aura 06/21/2013   Obesity 06/16/2011   Routine general medical examination at a health care facility 06/14/2011   SCIATICA 05/22/2009   GASTROESOPHAGEAL REFLUX DISEASE 04/14/2009   Factor 5 Leiden mutation, heterozygous 12/07/2007   Depression with anxiety 11/25/2006   IBS 11/25/2006   DVT, HX OF 11/25/2006   MIGRAINES, HX OF 11/25/2006   HYPERCHOLESTEROLEMIA, PURE 10/29/2006    PCP: [name] LABOR, MD PCP - General   REFERRING PROVIDER: Jerilynn Daphne SAILOR, NP Ref  Provider   REFERRING DIAG: R53.81 (ICD-10-CM) - Debility   THERAPY DIAG:  Muscle weakness (generalized)  Decreased activity tolerance  Rationale for Evaluation and Treatment: rehabilitation  ONSET DATE: October 9th to 31st (hospitilization for obstruction and subsequent surgeries)  SUBJECTIVE:   SUBJECTIVE STATEMENT: I stepped into my SUV when I was leaving last visit and strained my right calf so I did not start the exercises for 1 week.   EVAL: 22 days in hospital due to bowel obstruction and subsequent surgery. Lower body feels weaker than upper body. Some pain still with open incision site that is healing.   PERTINENT HISTORY: 2 knees with OA, open wound healing  on abdomen with pain. Orthopedist says no squats or lunges reportedly due to BL knees.    PAIN:  Are you having pain? No  PRECAUTIONS: None  RED FLAGS: None   WEIGHT BEARING RESTRICTIONS: No  FALLS:  Has patient fallen in last 6 months? No  OCCUPATION: n/a  PLOF: Independent  PATIENT GOALS: improve ADL completion, HEP  OBJECTIVE:  Note: Objective measures were completed at Evaluation unless otherwise noted.   PATIENT SURVEYS:  PSFS: THE PATIENT SPECIFIC FUNCTIONAL SCALE  Place score of 0-10 (0 = unable to perform activity and 10 = able to perform activity at the same level as before injury or problem)  Activity Date: 03/26/24    Walk long distances 5    2. Stand for extended period 5    3. Climb more than one set of home stairs 5    4.      Total Score 15      Total Score = Sum of activity scores/number of activities  Minimally Detectable Change: 3 points (for single activity); 2 points (for average score)  [facility] (nd). The Patient Specific Functional Scale . Retrieved from [employer][email or web address]   COGNITION: Overall cognitive status: Within functional limits for tasks assessed     SENSATION: WFL   POSTURE: rounded shoulders, forward head, and increased lumbar lordosis  PALPATION: N/a   UE ROM: BL UE WFL  MMT 4- grossly  LOWER EXTREMITY ROM:  Active ROM Right eval Left eval  Hip flexion wfl wfl  Hip extension    Hip abduction wfl wfl  Hip adduction wfl wfl  Hip internal rotation    Hip external rotation    Knee flexion wfl wfl  Knee extension wfl wfl  Ankle dorsiflexion    Ankle plantarflexion    Ankle inversion    Ankle eversion     (Blank rows = not tested)  LOWER EXTREMITY MMT:  MMT Right eval Left eval  Hip flexion 4- 4-  Hip extension    Hip abduction 4- 4-  Hip adduction 4- 4-  Hip internal rotation    Hip external rotation    Knee flexion 4- 4-  Knee  extension 4 4  Ankle dorsiflexion    Ankle plantarflexion    Ankle inversion    Ankle eversion     (Blank rows = not tested)  FUNCTIONAL TESTS  5x STS: 13.41 5x STS w/ sitting back down at end (using BL UE for support)  TREATMENT DATE:   [facility] Adult PT Treatment:                                                DATE: 04/13/24 Therapeutic Exercise: Nustep L4 UE/LE x 5 minutes RTB clam  3 x 8 RTB march 3 x 8 LAQ 3 x 8 added RTB  Seated heel and toe raises Runners calf stretch  Mini Bridge 8 x 2  Update HEP      TREATMENT 03/26/2024:  Therapeutic Exercise: RTB clamshell x8x3s RTB Hip marches x8x3s Seated LAQ 2x6x3s     Self-care/Home Management: Patient educated on HEP, POC, prognosis, and relevant tissues/anatomy.     PATIENT EDUCATION:  Education details: HEP Person educated: Patient Education method: Programmer, Multimedia, Facilities Manager, and Handouts Education comprehension: verbalized understanding and returned demonstration  HOME EXERCISE PROGRAM: 5x/wk, 2x/day RTB clamshell x8x3s RTB Hip marches x8x3s Seated LAQ 2x6x3s   ASSESSMENT:  CLINICAL IMPRESSION: Pt reports right calf strain after leaving PT and getting into vehicle. Started her HEP 1 week ago and has been compliant. Calf is better. Added seated heel and toe raises today as well as a small bridge and updated HEP. Will assess response and continue. She did well on Nustep with new issues.   EVAL: Patient is a 68 year old female who presents with general whole body weakness due to 22 day hospital day due to bowel obstruction and subsequent surgeries. Patient presents with deficits in: general body strength (BL LE > BL UE) and functional activity tolerance. As a result, the patient would benefit from skilled PT to address aforementioned deficits via plan below.   OBJECTIVE IMPAIRMENTS:  decreased activity tolerance, decreased balance, difficulty walking, decreased strength, improper body mechanics, postural dysfunction, obesity, and pain.   ACTIVITY LIMITATIONS: carrying, lifting, standing, squatting, and stairs  PERSONAL FACTORS: Age, Fitness, Past/current experiences, Time since onset of injury/illness/exacerbation, and 1-2 comorbidities:   are also affecting patient's functional outcome.   REHAB POTENTIAL: Fair    CLINICAL DECISION MAKING: Stable/uncomplicated  EVALUATION COMPLEXITY: Moderate   GOALS: Goals reviewed with patient? No  SHORT TERM GOALS: Target date: 04/17/2024   1) Patient will demonstrate 75% HEP compliance to show independence with self-management of condition   Baseline: 0% Goal status: INITIAL     LONG TERM GOALS: Target date: 05/26/24   1) Patient will demonstrate 100% HEP compliance to show independence with self-management of condition   Baseline: 0% Goal status: INITIAL    3) Patient will demonstrate a 7 point improvement in PSFS to show improvements in ADL completion and overall QOL    Baseline: 15 Goal status: INITIAL  4) Patient will be able to perform ADLs with at least 85% capacity to demonstrate improvements in overall functional activity tolerance, strength, and QOL    Baseline: 20% Goal status: INITIAL      PLAN:  PT FREQUENCY: 1-2x/week  PT DURATION: 8 weeks  PLANNED INTERVENTIONS: 97110-Therapeutic exercises, 97530- Therapeutic activity, 97112- Neuromuscular re-education, 97535- Self Care, [id number]- Manual therapy, and Patient/Family education  PLAN FOR NEXT SESSION: HEP assessment and progression, symptom modulation, and loading (isolated and/or functional). Manual therapy, aerobic, gait, and NME training as needed. Functional strengthening (lower body> upper body) as tolerated based on sx.    Washington Greener Sicat  PT, DPT  04/13/2024, 1:30 PM

## 2024-04-15 ENCOUNTER — Other Ambulatory Visit: Payer: Self-pay

## 2024-04-15 ENCOUNTER — Encounter: Payer: Self-pay | Admitting: Physical Therapy

## 2024-04-15 ENCOUNTER — Ambulatory Visit: Admitting: Physical Therapy

## 2024-04-15 DIAGNOSIS — M6281 Muscle weakness (generalized): Secondary | ICD-10-CM | POA: Diagnosis not present

## 2024-04-15 DIAGNOSIS — R6889 Other general symptoms and signs: Secondary | ICD-10-CM

## 2024-04-15 DIAGNOSIS — R197 Diarrhea, unspecified: Secondary | ICD-10-CM

## 2024-04-15 NOTE — Therapy (Signed)
 OUTPATIENT PHYSICAL THERAPY LOWER EXTREMITY TREATMENT   Patient Name: Kristine [name] MRN: [id number] DOB:1955/07/03, 68 y.o., female Today's Date: 04/15/2024  END OF SESSION:  PT End of Session - 04/15/24 1105     Visit Number 3    Number of Visits 16    Date for Recertification  05/26/24    Authorization Type Medicare A and B    PT Start Time 1103    PT Stop Time 1145    PT Time Calculation (min) 42 min          Past Medical History:  Diagnosis Date   Allergy    Anxiety    Arthritis 2025   Toe/knee   Basal cell carcinoma    face   Cataract 2019   Clotting disorder 2009   Factor 5   Depression 1989   DVT (deep venous thrombosis) (HCC) 05/13/2006   Esophageal erosions    Factor V Leiden    On coag x 1 year ending in 09   Gastritis 05/13/1988   hospital- abd pain   GERD (gastroesophageal reflux disease)    Hx of adenomatous polyp of colon 12/06/2022   5 mm rectal adenoma repeat colonoscopy 2031    Hyperlipidemia    Hypertension    Radial head fracture 05/13/2009   Past Surgical History:  Procedure Laterality Date   BIOPSY BREAST Right 10/2022   BREAST SURGERY  2006   Ductal Papilloma/2023 breast biopsy-benign/2024 breast biopsy -benign   CESAREAN SECTION  1987   CHOLECYSTECTOMY  01/27/2003   COLONOSCOPY     DILATION AND CURETTAGE OF UTERUS  08/22/2022   IR CV LINE INJECTION  03/10/2024   IR RADIOLOGIST EVAL & MGMT  03/18/2024   LAPAROSCOPY N/A 02/23/2024   Procedure: LAPAROSCOPY, DIAGNOSTIC;  Surgeon: Vernetta Berg, MD;  Location: [facility] OR;  Service: General;  Laterality: N/A;   LAPAROTOMY N/A 02/23/2024   Procedure: LAPAROTOMY, EXPLORATORY;  Surgeon: Vernetta Berg, MD;  Location: [facility] OR;  Service: General;  Laterality: N/A;   MOHS SURGERY  11/10/2009   basal cell carcinoma near eye   OOPHORECTOMY     SHOULDER SURGERY  04/12/2006   SMALL INTESTINE SURGERY  02/23/2024   Small bowel obstruction   TONSILLECTOMY  05/13/1974   UPPER  GASTROINTESTINAL ENDOSCOPY     Patient Active Problem List   Diagnosis Date Noted   Diarrhea 03/18/2024   H/O drainage of abscess 03/10/2024   Mild peripheral edema 03/10/2024   Ileus, postoperative (HCC) 03/10/2024   Pelvic abscess in female 03/08/2024   Debility 03/07/2024   Moderate protein-calorie malnutrition 02/27/2024   SBO (small bowel obstruction) (HCC) 02/20/2024   Back pain 12/23/2023   Adverse effect of metformin  09/30/2023   Hx of adenomatous polyp of colon 12/06/2022   Arm vein blood clot, right 12/26/2020   Estrogen deficiency 10/24/2020   History of pulmonary hypertension 06/08/2019   History of shingles 01/21/2018   Prediabetes 04/21/2015   Stress reaction 04/21/2015   Essential hypertension 04/21/2015   Migraine with aura 06/21/2013   Obesity 06/16/2011   Routine general medical examination at a health care facility 06/14/2011   SCIATICA 05/22/2009   GASTROESOPHAGEAL REFLUX DISEASE 04/14/2009   Factor 5 Leiden mutation, heterozygous 12/07/2007   Depression with anxiety 11/25/2006   IBS 11/25/2006   DVT, HX OF 11/25/2006   MIGRAINES, HX OF 11/25/2006   HYPERCHOLESTEROLEMIA, PURE 10/29/2006    PCP: [name] LABOR, MD PCP - General   REFERRING PROVIDER: Jerilynn Daphne SAILOR, NP Ref  Provider   REFERRING DIAG: R53.81 (ICD-10-CM) - Debility   THERAPY DIAG:  Muscle weakness (generalized)  Decreased activity tolerance  Rationale for Evaluation and Treatment: rehabilitation  ONSET DATE: October 9th to 31st (hospitilization for obstruction and subsequent surgeries)  SUBJECTIVE:   SUBJECTIVE STATEMENT: I was a little achy all over after last session and a little increased back pain. I started driving. I went up and down the stairs at home without issue.   EVAL: 22 days in hospital due to bowel obstruction and subsequent surgery. Lower body feels weaker than upper body. Some pain still with open incision site that is healing.   PERTINENT HISTORY: 2  knees with OA, open wound healing on abdomen with pain. Orthopedist says no squats or lunges reportedly due to BL knees.    PAIN:  Are you having pain? No  PRECAUTIONS: None  RED FLAGS: None   WEIGHT BEARING RESTRICTIONS: No  FALLS:  Has patient fallen in last 6 months? No  OCCUPATION: n/a  PLOF: Independent  PATIENT GOALS: improve ADL completion, HEP  OBJECTIVE:  Note: Objective measures were completed at Evaluation unless otherwise noted.   PATIENT SURVEYS:  PSFS: THE PATIENT SPECIFIC FUNCTIONAL SCALE  Place score of 0-10 (0 = unable to perform activity and 10 = able to perform activity at the same level as before injury or problem)  Activity Date: 03/26/24    Walk long distances 5    2. Stand for extended period 5    3. Climb more than one set of home stairs 5    4.      Total Score 15      Total Score = Sum of activity scores/number of activities  Minimally Detectable Change: 3 points (for single activity); 2 points (for average score)  [employer] [facility] (nd). The Patient Specific Functional Scale . Retrieved from [email or web address]   COGNITION: Overall cognitive status: Within functional limits for tasks assessed     SENSATION: WFL   POSTURE: rounded shoulders, forward head, and increased lumbar lordosis  PALPATION: N/a   UE ROM: BL UE WFL  MMT 4- grossly  LOWER EXTREMITY ROM:  Active ROM Right eval Left eval  Hip flexion wfl wfl  Hip extension    Hip abduction wfl wfl  Hip adduction wfl wfl  Hip internal rotation    Hip external rotation    Knee flexion wfl wfl  Knee extension wfl wfl  Ankle dorsiflexion    Ankle plantarflexion    Ankle inversion    Ankle eversion     (Blank rows = not tested)  LOWER EXTREMITY MMT:  MMT Right eval Left eval  Hip flexion 4- 4-  Hip extension    Hip abduction 4- 4-  Hip adduction 4- 4-  Hip internal rotation    Hip external  rotation    Knee flexion 4- 4-  Knee extension 4 4  Ankle dorsiflexion    Ankle plantarflexion    Ankle inversion    Ankle eversion     (Blank rows = not tested)  FUNCTIONAL TESTS  5x STS: 13.41 5 x STS w/ sitting back down at end (using BL UE for support)  TREATMENT DATE:   [facility] Adult PT Treatment:                                                DATE: 04/15/24 Therapeutic Exercise: Nustep L4 x 6 minutes  Standing heel and toe raises Standing hip abductions x 8 each Standing March x 8 each  STS 5 x 2 from elevated seated  PPT / mini bridge 3 sec x 10  Ball squeeze 30 sec x 2 - (3 sec x 10) SL Clam AROM x 10 each  Seated scap squeeze 5 sec x 5      [facility] Adult PT Treatment:                                                DATE: 04/13/24 Therapeutic Exercise: Nustep L4 UE/LE x 5 minutes RTB clam  3 x 8 RTB march 3 x 8 LAQ 3 x 8 added RTB  Seated heel and toe raises Runners calf stretch  Mini Bridge 8 x 2  Update HEP     [facility] Adult PT Treatment:                                                DATE: 04/13/24 Therapeutic Exercise: Nustep L4 UE/LE x 5 minutes RTB clam  3 x 8 RTB march 3 x 8 LAQ 3 x 8 added RTB  Seated heel and toe raises Runners calf stretch  Mini Bridge 8 x 2  Update HEP      TREATMENT 03/26/2024:  Therapeutic Exercise: RTB clamshell x8x3s RTB Hip marches x8x3s Seated LAQ 2x6x3s     Self-care/Home Management: Patient educated on HEP, POC, prognosis, and relevant tissues/anatomy.     PATIENT EDUCATION:  Education details: HEP Person educated: Patient Education method: Programmer, Multimedia, Facilities Manager, and Handouts Education comprehension: verbalized understanding and returned demonstration  HOME EXERCISE PROGRAM: 5x/wk, 2x/day RTB clamshell x8x3s RTB Hip marches x8x3s Seated LAQ 2x6x3s    ASSESSMENT:  CLINICAL IMPRESSION: Pt reports she was a little achy after last session, took some tylenol  and reports no pain on arrival. Progressed to closed chain activity. She is limited by knee pain R>L. Did well with STS although does feel increased knee pain with controlled descent. Will assess response and progress as tolerated.   EVAL: Patient is a 68 year old female who presents with general whole body weakness due to 22 day hospital day due to bowel obstruction and subsequent surgeries. Patient presents with deficits in: general body strength (BL LE > BL UE) and functional activity tolerance. As a result, the patient would benefit from skilled PT to address aforementioned deficits via plan below.   OBJECTIVE IMPAIRMENTS: decreased activity tolerance, decreased balance, difficulty walking, decreased strength, improper body mechanics, postural dysfunction, obesity, and pain.   ACTIVITY LIMITATIONS: carrying, lifting, standing, squatting, and stairs  PERSONAL FACTORS: Age, Fitness, Past/current experiences, Time since onset of injury/illness/exacerbation, and 1-2 comorbidities:   are also affecting patient's functional outcome.   REHAB POTENTIAL: Fair    CLINICAL DECISION MAKING: Stable/uncomplicated  EVALUATION COMPLEXITY: Moderate   GOALS: Goals reviewed with  patient? No  SHORT TERM GOALS: Target date: 04/17/2024   1) Patient will demonstrate 75% HEP compliance to show independence with self-management of condition   Baseline: 0% Goal status: INITIAL     LONG TERM GOALS: Target date: 05/26/24   1) Patient will demonstrate 100% HEP compliance to show independence with self-management of condition   Baseline: 0% Goal status: INITIAL    3) Patient will demonstrate a 7 point improvement in PSFS to show improvements in ADL completion and overall QOL    Baseline: 15 Goal status: INITIAL  4) Patient will be able to perform ADLs with at least 85% capacity to  demonstrate improvements in overall functional activity tolerance, strength, and QOL    Baseline: 20% Goal status: INITIAL      PLAN:  PT FREQUENCY: 1-2x/week  PT DURATION: 8 weeks  PLANNED INTERVENTIONS: 97110-Therapeutic exercises, 97530- Therapeutic activity, 97112- Neuromuscular re-education, 97535- Self Care, [id number]- Manual therapy, and Patient/Family education  PLAN FOR NEXT SESSION: HEP assessment and progression, symptom modulation, and loading (isolated and/or functional). Manual therapy, aerobic, gait, and NME training as needed. Functional strengthening (lower body> upper body) as tolerated based on sx.    Harlene Persons, PTA 04/15/24 2:28 PM Phone: [phone] Fax: [phone]

## 2024-04-16 LAB — C. DIFFICILE GDH AND TOXIN A/B
GDH ANTIGEN: NOT DETECTED
MICRO NUMBER:: 17313556
SPECIMEN QUALITY:: ADEQUATE
TOXIN A AND B: NOT DETECTED

## 2024-04-19 ENCOUNTER — Ambulatory Visit

## 2024-04-19 DIAGNOSIS — M6281 Muscle weakness (generalized): Secondary | ICD-10-CM | POA: Diagnosis not present

## 2024-04-19 DIAGNOSIS — R5381 Other malaise: Secondary | ICD-10-CM

## 2024-04-19 DIAGNOSIS — R6889 Other general symptoms and signs: Secondary | ICD-10-CM

## 2024-04-19 NOTE — Therapy (Signed)
 OUTPATIENT PHYSICAL THERAPY LOWER EXTREMITY TREATMENT   Patient Name: Kristine [name] MRN: [id number] DOB:Sep 19, 1955, 68 y.o., female Today's Date: 04/19/2024  END OF SESSION:  PT End of Session - 04/19/24 1402     Visit Number 4    Number of Visits 16    Date for Recertification  05/26/24    Authorization Type Medicare A and B    PT Start Time 1400    PT Stop Time 1440    PT Time Calculation (min) 40 min    Activity Tolerance Patient tolerated treatment well           Past Medical History:  Diagnosis Date   Allergy    Anxiety    Arthritis 2025   Toe/knee   Basal cell carcinoma    face   Cataract 2019   Clotting disorder 2009   Factor 5   Depression 1989   DVT (deep venous thrombosis) (HCC) 05/13/2006   Esophageal erosions    Factor V Leiden    On coag x 1 year ending in 09   Gastritis 05/13/1988   hospital- abd pain   GERD (gastroesophageal reflux disease)    Hx of adenomatous polyp of colon 12/06/2022   5 mm rectal adenoma repeat colonoscopy 2031    Hyperlipidemia    Hypertension    Radial head fracture 05/13/2009   Past Surgical History:  Procedure Laterality Date   BIOPSY BREAST Right 10/2022   BREAST SURGERY  2006   Ductal Papilloma/2023 breast biopsy-benign/2024 breast biopsy -benign   CESAREAN SECTION  1987   CHOLECYSTECTOMY  01/27/2003   COLONOSCOPY     DILATION AND CURETTAGE OF UTERUS  08/22/2022   IR CV LINE INJECTION  03/10/2024   IR RADIOLOGIST EVAL & MGMT  03/18/2024   LAPAROSCOPY N/A 02/23/2024   Procedure: LAPAROSCOPY, DIAGNOSTIC;  Surgeon: Vernetta Berg, MD;  Location: [facility] OR;  Service: General;  Laterality: N/A;   LAPAROTOMY N/A 02/23/2024   Procedure: LAPAROTOMY, EXPLORATORY;  Surgeon: Vernetta Berg, MD;  Location: [facility] OR;  Service: General;  Laterality: N/A;   MOHS SURGERY  11/10/2009   basal cell carcinoma near eye   OOPHORECTOMY     SHOULDER SURGERY  04/12/2006   SMALL INTESTINE SURGERY  02/23/2024   Small bowel  obstruction   TONSILLECTOMY  05/13/1974   UPPER GASTROINTESTINAL ENDOSCOPY     Patient Active Problem List   Diagnosis Date Noted   Diarrhea 03/18/2024   H/O drainage of abscess 03/10/2024   Mild peripheral edema 03/10/2024   Ileus, postoperative (HCC) 03/10/2024   Pelvic abscess in female 03/08/2024   Debility 03/07/2024   Moderate protein-calorie malnutrition 02/27/2024   SBO (small bowel obstruction) (HCC) 02/20/2024   Back pain 12/23/2023   Adverse effect of metformin  09/30/2023   Hx of adenomatous polyp of colon 12/06/2022   Arm vein blood clot, right 12/26/2020   Estrogen deficiency 10/24/2020   History of pulmonary hypertension 06/08/2019   History of shingles 01/21/2018   Prediabetes 04/21/2015   Stress reaction 04/21/2015   Essential hypertension 04/21/2015   Migraine with aura 06/21/2013   Obesity 06/16/2011   Routine general medical examination at a health care facility 06/14/2011   SCIATICA 05/22/2009   GASTROESOPHAGEAL REFLUX DISEASE 04/14/2009   Factor 5 Leiden mutation, heterozygous 12/07/2007   Depression with anxiety 11/25/2006   IBS 11/25/2006   DVT, HX OF 11/25/2006   MIGRAINES, HX OF 11/25/2006   HYPERCHOLESTEROLEMIA, PURE 10/29/2006    PCP: [name] LABOR, MD PCP -  General   REFERRING PROVIDER: Jerilynn Daphne SAILOR, NP Ref Provider   REFERRING DIAG: R53.81 (ICD-10-CM) - Debility   THERAPY DIAG:  Muscle weakness (generalized)  Decreased activity tolerance  Debility  Rationale for Evaluation and Treatment: rehabilitation  ONSET DATE: October 9th to 31st (hospitilization for obstruction and subsequent surgeries)  SUBJECTIVE:   SUBJECTIVE STATEMENT: Doing well with HEP and feeling pretty good. 2/10 pain w/ R knee. Everything else is feeling ok.  EVAL: 22 days in hospital due to bowel obstruction and subsequent surgery. Lower body feels weaker than upper body. Some pain still with open incision site that is healing.   PERTINENT HISTORY: 2  knees with OA, open wound healing on abdomen with pain. Orthopedist says no squats or lunges reportedly due to BL knees.    PAIN:  Are you having pain? No  PRECAUTIONS: None  RED FLAGS: None   WEIGHT BEARING RESTRICTIONS: No  FALLS:  Has patient fallen in last 6 months? No  OCCUPATION: n/a  PLOF: Independent  PATIENT GOALS: improve ADL completion, HEP  OBJECTIVE:  Note: Objective measures were completed at Evaluation unless otherwise noted.   PATIENT SURVEYS:  PSFS: THE PATIENT SPECIFIC FUNCTIONAL SCALE  Place score of 0-10 (0 = unable to perform activity and 10 = able to perform activity at the same level as before injury or problem)  Activity Date: 03/26/24    Walk long distances 5    2. Stand for extended period 5    3. Climb more than one set of home stairs 5    4.      Total Score 15      Total Score = Sum of activity scores/number of activities  Minimally Detectable Change: 3 points (for single activity); 2 points (for average score)  [facility] (nd). The Patient Specific Functional Scale . Retrieved from [email or web address]   COGNITION: Overall cognitive status: Within functional limits for tasks assessed     SENSATION: WFL   POSTURE: rounded shoulders, forward head, and increased lumbar lordosis  PALPATION: N/a   UE ROM: BL UE WFL  MMT 4- grossly  LOWER EXTREMITY ROM:  Active ROM Right eval Left eval  Hip flexion wfl wfl  Hip extension    Hip abduction wfl wfl  Hip adduction wfl wfl  Hip internal rotation    Hip external rotation    Knee flexion wfl wfl  Knee extension wfl wfl  Ankle dorsiflexion    Ankle plantarflexion    Ankle inversion    Ankle eversion     (Blank rows = not tested)  LOWER EXTREMITY MMT:  MMT Right eval Left eval  Hip flexion 4- 4-  Hip extension    Hip abduction 4- 4-  Hip adduction 4- 4-  Hip internal rotation    Hip external  rotation    Knee flexion 4- 4-  Knee extension 4 4  Ankle dorsiflexion    Ankle plantarflexion    Ankle inversion    Ankle eversion     (Blank rows = not tested)  FUNCTIONAL TESTS  5x STS: 13.41 5 x STS w/ sitting back down at end (using BL UE for support)  TREATMENT DATE:   [facility] Adult PT Treatment:                                                DATE: 04/19/24 Therapeutic Exercise/Activity: HEP reassessment and update 2# scaption x8x3s, 3# x6x3s 3# shoulder press 2x8 RTB row x6x3s RTB chest press x6x3s Red clamshell x8x3s, x6x3s GTB STS x6 lowest table RTB LAQx4x3s, YTB x4x3s     [facility] Adult PT Treatment:                                                DATE: 04/15/24 Therapeutic Exercise: Nustep L4 x 6 minutes  Standing heel and toe raises Standing hip abductions x 8 each Standing March x 8 each  STS 5 x 2 from elevated seated  PPT / mini bridge 3 sec x 10  Ball squeeze 30 sec x 2 - (3 sec x 10) SL Clam AROM x 10 each  Seated scap squeeze 5 sec x 5      [facility] Adult PT Treatment:                                                DATE: 04/13/24 Therapeutic Exercise: Nustep L4 UE/LE x 5 minutes RTB clam  3 x 8 RTB march 3 x 8 LAQ 3 x 8 added RTB  Seated heel and toe raises Runners calf stretch  Mini Bridge 8 x 2  Update HEP     [facility] Adult PT Treatment:                                                DATE: 04/13/24 Therapeutic Exercise: Nustep L4 UE/LE x 5 minutes RTB clam  3 x 8 RTB march 3 x 8 LAQ 3 x 8 added RTB  Seated heel and toe raises Runners calf stretch  Mini Bridge 8 x 2  Update HEP      TREATMENT 03/26/2024:  Therapeutic Exercise: RTB clamshell x8x3s RTB Hip marches x8x3s Seated YTB LAQ 2x6x3s  3# scaption 2x4x3s RTB row x6x3s RTB chest press x4x3s    Self-care/Home Management: Patient educated on HEP, POC,  prognosis, and relevant tissues/anatomy.     PATIENT EDUCATION:  Education details: HEP Person educated: Patient Education method: Programmer, Multimedia, Facilities Manager, and Handouts Education comprehension: verbalized understanding and returned demonstration  HOME EXERCISE PROGRAM: 5x/wk, 2x/day GTB clamshell x8x3s RTB Hip marches x8x3s Seated YTB LAQ 2x4x3s  3# scaption raise 2x4x3s RTB row x6x3s RTB chest press x4x3s  ASSESSMENT:  CLINICAL IMPRESSION: Patient tolerated treatment with no significant increases in pain with progressions in whole body isolated and functional strengthening. Current deficits include: functional activity tolerance and whole body strength. As a result, patient would continue to benefit from skilled PT to address said deficits via plan below.    EVAL: Patient is a 69 year old female who presents with general whole body weakness due to 22 day hospital day due to bowel obstruction and subsequent  surgeries. Patient presents with deficits in: general body strength (BL LE > BL UE) and functional activity tolerance. As a result, the patient would benefit from skilled PT to address aforementioned deficits via plan below.   OBJECTIVE IMPAIRMENTS: decreased activity tolerance, decreased balance, difficulty walking, decreased strength, improper body mechanics, postural dysfunction, obesity, and pain.   ACTIVITY LIMITATIONS: carrying, lifting, standing, squatting, and stairs  PERSONAL FACTORS: Age, Fitness, Past/current experiences, Time since onset of injury/illness/exacerbation, and 1-2 comorbidities:   are also affecting patient's functional outcome.   REHAB POTENTIAL: Fair    CLINICAL DECISION MAKING: Stable/uncomplicated  EVALUATION COMPLEXITY: Moderate   GOALS: Goals reviewed with patient? No  SHORT TERM GOALS: Target date: 04/17/2024   1) Patient will demonstrate 75% HEP compliance to show independence with self-management of condition   Baseline:  0% Goal status: INITIAL     LONG TERM GOALS: Target date: 05/26/24   1) Patient will demonstrate 100% HEP compliance to show independence with self-management of condition   Baseline: 0% Goal status: INITIAL    3) Patient will demonstrate a 7 point improvement in PSFS to show improvements in ADL completion and overall QOL    Baseline: 15 Goal status: INITIAL  4) Patient will be able to perform ADLs with at least 85% capacity to demonstrate improvements in overall functional activity tolerance, strength, and QOL    Baseline: 20% Goal status: INITIAL      PLAN:  PT FREQUENCY: 1-2x/week  PT DURATION: 8 weeks  PLANNED INTERVENTIONS: 97110-Therapeutic exercises, 97530- Therapeutic activity, 97112- Neuromuscular re-education, 97535- Self Care, [id number]- Manual therapy, and Patient/Family education  PLAN FOR NEXT SESSION: HEP assessment and progression, symptom modulation, and loading (isolated and/or functional). Manual therapy, aerobic, gait, and NME training as needed. Functional strengthening (lower body> upper body) as tolerated based on sx.    [place] Odessia Scot  PT, DPT

## 2024-04-22 ENCOUNTER — Ambulatory Visit

## 2024-04-22 DIAGNOSIS — M6281 Muscle weakness (generalized): Secondary | ICD-10-CM | POA: Diagnosis not present

## 2024-04-22 DIAGNOSIS — R6889 Other general symptoms and signs: Secondary | ICD-10-CM

## 2024-04-22 DIAGNOSIS — R5381 Other malaise: Secondary | ICD-10-CM

## 2024-04-22 NOTE — Therapy (Signed)
 OUTPATIENT PHYSICAL THERAPY LOWER EXTREMITY TREATMENT   Patient Name: Kristine [name] MRN: [id number] DOB:Oct 27, 1955, 68 y.o., female Today's Date: 04/22/2024  END OF SESSION:  PT End of Session - 04/22/24 1450     Visit Number 5    Number of Visits 16    Date for Recertification  05/26/24    Authorization Type Medicare A and B    PT Start Time 1445    PT Stop Time 1525    PT Time Calculation (min) 40 min    Activity Tolerance Patient tolerated treatment well            Past Medical History:  Diagnosis Date   Allergy    Anxiety    Arthritis 2025   Toe/knee   Basal cell carcinoma    face   Cataract 2019   Clotting disorder 2009   Factor 5   Depression 1989   DVT (deep venous thrombosis) (HCC) 05/13/2006   Esophageal erosions    Factor V Leiden    On coag x 1 year ending in 09   Gastritis 05/13/1988   hospital- abd pain   GERD (gastroesophageal reflux disease)    Hx of adenomatous polyp of colon 12/06/2022   5 mm rectal adenoma repeat colonoscopy 2031    Hyperlipidemia    Hypertension    Radial head fracture 05/13/2009   Past Surgical History:  Procedure Laterality Date   BIOPSY BREAST Right 10/2022   BREAST SURGERY  2006   Ductal Papilloma/2023 breast biopsy-benign/2024 breast biopsy -benign   CESAREAN SECTION  1987   CHOLECYSTECTOMY  01/27/2003   COLONOSCOPY     DILATION AND CURETTAGE OF UTERUS  08/22/2022   IR CV LINE INJECTION  03/10/2024   IR RADIOLOGIST EVAL & MGMT  03/18/2024   LAPAROSCOPY N/A 02/23/2024   Procedure: LAPAROSCOPY, DIAGNOSTIC;  Surgeon: Vernetta Berg, MD;  Location: [facility] OR;  Service: General;  Laterality: N/A;   LAPAROTOMY N/A 02/23/2024   Procedure: LAPAROTOMY, EXPLORATORY;  Surgeon: Vernetta Berg, MD;  Location: [facility] OR;  Service: General;  Laterality: N/A;   MOHS SURGERY  11/10/2009   basal cell carcinoma near eye   OOPHORECTOMY     SHOULDER SURGERY  04/12/2006   SMALL INTESTINE SURGERY  02/23/2024   Small bowel  obstruction   TONSILLECTOMY  05/13/1974   UPPER GASTROINTESTINAL ENDOSCOPY     Patient Active Problem List   Diagnosis Date Noted   Diarrhea 03/18/2024   H/O drainage of abscess 03/10/2024   Mild peripheral edema 03/10/2024   Ileus, postoperative (HCC) 03/10/2024   Pelvic abscess in female 03/08/2024   Debility 03/07/2024   Moderate protein-calorie malnutrition 02/27/2024   SBO (small bowel obstruction) (HCC) 02/20/2024   Back pain 12/23/2023   Adverse effect of metformin  09/30/2023   Hx of adenomatous polyp of colon 12/06/2022   Arm vein blood clot, right 12/26/2020   Estrogen deficiency 10/24/2020   History of pulmonary hypertension 06/08/2019   History of shingles 01/21/2018   Prediabetes 04/21/2015   Stress reaction 04/21/2015   Essential hypertension 04/21/2015   Migraine with aura 06/21/2013   Obesity 06/16/2011   Routine general medical examination at a health care facility 06/14/2011   SCIATICA 05/22/2009   GASTROESOPHAGEAL REFLUX DISEASE 04/14/2009   Factor 5 Leiden mutation, heterozygous 12/07/2007   Depression with anxiety 11/25/2006   IBS 11/25/2006   DVT, HX OF 11/25/2006   MIGRAINES, HX OF 11/25/2006   HYPERCHOLESTEROLEMIA, PURE 10/29/2006    PCP: [name] LABOR, MD PCP -  General   REFERRING PROVIDER: Jerilynn Daphne SAILOR, NP Ref Provider   REFERRING DIAG: R53.81 (ICD-10-CM) - Debility   THERAPY DIAG:  Muscle weakness (generalized)  Decreased activity tolerance  Debility  Rationale for Evaluation and Treatment: rehabilitation  ONSET DATE: October 9th to 31st (hospitilization for obstruction and subsequent surgeries)  SUBJECTIVE:   SUBJECTIVE STATEMENT: Doing well with HEP and feeling pretty good. Was sore after doing HEP. Feeling more tired today.   EVAL: 22 days in hospital due to bowel obstruction and subsequent surgery. Lower body feels weaker than upper body. Some pain still with open incision site that is healing.   PERTINENT HISTORY: 2  knees with OA, open wound healing on abdomen with pain. Orthopedist says no squats or lunges reportedly due to BL knees.   PAIN:  Are you having pain? No  PRECAUTIONS: None  RED FLAGS: None   WEIGHT BEARING RESTRICTIONS: No  FALLS:  Has patient fallen in last 6 months? No  OCCUPATION: n/a  PLOF: Independent  PATIENT GOALS: improve ADL completion, HEP  OBJECTIVE:  Note: Objective measures were completed at Evaluation unless otherwise noted.     PATIENT SURVEYS:  PSFS: THE PATIENT SPECIFIC FUNCTIONAL SCALE  Place score of 0-10 (0 = unable to perform activity and 10 = able to perform activity at the same level as before injury or problem)  Activity Date: 03/26/24    Walk long distances 5    2. Stand for extended period 5    3. Climb more than one set of home stairs 5    4.      Total Score 15      Total Score = Sum of activity scores/number of activities  Minimally Detectable Change: 3 points (for single activity); 2 points (for average score)  [employer] [facility] (nd). The Patient Specific Functional Scale . Retrieved from [email or web address]   COGNITION: Overall cognitive status: Within functional limits for tasks assessed     SENSATION: WFL   POSTURE: rounded shoulders, forward head, and increased lumbar lordosis  PALPATION: N/a   UE ROM: BL UE WFL  MMT 4- grossly  LOWER EXTREMITY ROM:  Active ROM Right eval Left eval  Hip flexion wfl wfl  Hip extension    Hip abduction wfl wfl  Hip adduction wfl wfl  Hip internal rotation    Hip external rotation    Knee flexion wfl wfl  Knee extension wfl wfl  Ankle dorsiflexion    Ankle plantarflexion    Ankle inversion    Ankle eversion     (Blank rows = not tested)  LOWER EXTREMITY MMT:  MMT Right eval Left eval  Hip flexion 4- 4-  Hip extension    Hip abduction 4- 4-  Hip adduction 4- 4-  Hip internal rotation    Hip external  rotation    Knee flexion 4- 4-  Knee extension 4 4  Ankle dorsiflexion    Ankle plantarflexion    Ankle inversion    Ankle eversion     (Blank rows = not tested)  FUNCTIONAL TESTS  5x STS: 13.41 5 x STS w/ sitting back down at end (using BL UE for support)  TREATMENT DATE:   [facility] Adult PT Treatment:                                                DATE: 04/22/24 Therapeutic Exercise/Activity: HEP reassessment and update Seated Clamshell x6x3s GTB, blue TB 2x6x3s Seated LAQ x8x3s, x6x3s RTB x4x3s, x6x3s Seated marches 2x4x3s STS x5 lowest table RTB x6 CW/CCW circles RTB row x6x3s   Did not do: 2# scaption x8x3s, 3# x6x3s 3# shoulder press 2x8 RTB chest press x6x3s   [facility] Adult PT Treatment:                                                DATE: 04/15/24 Therapeutic Exercise: Nustep L4 x 6 minutes  Standing heel and toe raises Standing hip abductions x 8 each Standing March x 8 each  STS 5 x 2 from elevated seated  PPT / mini bridge 3 sec x 10  Ball squeeze 30 sec x 2 - (3 sec x 10) SL Clam AROM x 10 each  Seated scap squeeze 5 sec x 5      [facility] Adult PT Treatment:                                                DATE: 04/13/24 Therapeutic Exercise: Nustep L4 UE/LE x 5 minutes RTB clam  3 x 8 RTB march 3 x 8 LAQ 3 x 8 added RTB  Seated heel and toe raises Runners calf stretch  Mini Bridge 8 x 2  Update HEP     [facility] Adult PT Treatment:                                                DATE: 04/13/24 Therapeutic Exercise: Nustep L4 UE/LE x 5 minutes RTB clam  3 x 8 RTB march 3 x 8 LAQ 3 x 8 added RTB  Seated heel and toe raises Runners calf stretch  Mini Bridge 8 x 2  Update HEP      TREATMENT 03/26/2024:  Therapeutic Exercise: RTB clamshell x8x3s RTB Hip marches x8x3s Seated YTB LAQ 2x6x3s  3# scaption 2x4x3s RTB row  x6x3s RTB chest press x4x3s    Self-care/Home Management: Patient educated on HEP, POC, prognosis, and relevant tissues/anatomy.     PATIENT EDUCATION:  Education details: HEP Person educated: Patient Education method: Programmer, Multimedia, Facilities Manager, and Handouts Education comprehension: verbalized understanding and returned demonstration  HOME EXERCISE PROGRAM: 5x/wk, 2x/day GTB clamshell x8x3s RTB Hip marches x8x3s Seated YTB LAQ 2x4x3s  3# scaption raise 2x4x3s RTB row x6x3s RTB chest press x4x3s  ASSESSMENT:  CLINICAL IMPRESSION: Patient tolerated treatment with no significant increases in pain with progressions in whole body isolated and functional strengthening. Current deficits include: functional activity tolerance and whole body strength. As a result, patient would continue to benefit from skilled PT to address said deficits via plan below.    EVAL: Patient is a 68 year old female who presents with general  whole body weakness due to 22 day hospital day due to bowel obstruction and subsequent surgeries. Patient presents with deficits in: general body strength (BL LE > BL UE) and functional activity tolerance. As a result, the patient would benefit from skilled PT to address aforementioned deficits via plan below.   OBJECTIVE IMPAIRMENTS: decreased activity tolerance, decreased balance, difficulty walking, decreased strength, improper body mechanics, postural dysfunction, obesity, and pain.   ACTIVITY LIMITATIONS: carrying, lifting, standing, squatting, and stairs  PERSONAL FACTORS: Age, Fitness, Past/current experiences, Time since onset of injury/illness/exacerbation, and 1-2 comorbidities:   are also affecting patient's functional outcome.   REHAB POTENTIAL: Fair    CLINICAL DECISION MAKING: Stable/uncomplicated  EVALUATION COMPLEXITY: Moderate   GOALS: Goals reviewed with patient? No  SHORT TERM GOALS: Target date: 04/17/2024   1) Patient will demonstrate  75% HEP compliance to show independence with self-management of condition   Baseline: 0% Goal status: INITIAL     LONG TERM GOALS: Target date: 05/26/24   1) Patient will demonstrate 100% HEP compliance to show independence with self-management of condition   Baseline: 0% Goal status: INITIAL    3) Patient will demonstrate a 7 point improvement in PSFS to show improvements in ADL completion and overall QOL    Baseline: 15 Goal status: INITIAL  4) Patient will be able to perform ADLs with at least 85% capacity to demonstrate improvements in overall functional activity tolerance, strength, and QOL    Baseline: 20% Goal status: INITIAL      PLAN:  PT FREQUENCY: 1-2x/week  PT DURATION: 8 weeks  PLANNED INTERVENTIONS: 97110-Therapeutic exercises, 97530- Therapeutic activity, 97112- Neuromuscular re-education, 97535- Self Care, [id number]- Manual therapy, and Patient/Family education  PLAN FOR NEXT SESSION: HEP assessment and progression, symptom modulation, and loading (isolated and/or functional). Manual therapy, aerobic, gait, and NME training as needed. Functional strengthening (lower body> upper body) as tolerated based on sx.    [place] Odessia Scot  PT, DPT

## 2024-04-29 ENCOUNTER — Ambulatory Visit: Admitting: Physical Therapy

## 2024-04-29 ENCOUNTER — Encounter: Payer: Self-pay | Admitting: Physical Therapy

## 2024-04-29 DIAGNOSIS — M6281 Muscle weakness (generalized): Secondary | ICD-10-CM

## 2024-04-29 DIAGNOSIS — R6889 Other general symptoms and signs: Secondary | ICD-10-CM

## 2024-04-29 NOTE — Therapy (Signed)
 OUTPATIENT PHYSICAL THERAPY LOWER EXTREMITY TREATMENT   Patient Name: Kristine [name] MRN: [id number] DOB:Oct 03, 1955, 68 y.o., female Today's Date: 04/29/2024  END OF SESSION:  PT End of Session - 04/29/24 1328     Visit Number 6    Number of Visits 16    Date for Recertification  05/26/24    Authorization Type Medicare A and B    PT Start Time 1325    PT Stop Time 1400    PT Time Calculation (min) 35 min            Past Medical History:  Diagnosis Date   Allergy    Anxiety    Arthritis 2025   Toe/knee   Basal cell carcinoma    face   Cataract 2019   Clotting disorder 2009   Factor 5   Depression 1989   DVT (deep venous thrombosis) (HCC) 05/13/2006   Esophageal erosions    Factor V Leiden    On coag x 1 year ending in 09   Gastritis 05/13/1988   hospital- abd pain   GERD (gastroesophageal reflux disease)    Hx of adenomatous polyp of colon 12/06/2022   5 mm rectal adenoma repeat colonoscopy 2031    Hyperlipidemia    Hypertension    Radial head fracture 05/13/2009   Past Surgical History:  Procedure Laterality Date   BIOPSY BREAST Right 10/2022   BREAST SURGERY  2006   Ductal Papilloma/2023 breast biopsy-benign/2024 breast biopsy -benign   CESAREAN SECTION  1987   CHOLECYSTECTOMY  01/27/2003   COLONOSCOPY     DILATION AND CURETTAGE OF UTERUS  08/22/2022   IR CV LINE INJECTION  03/10/2024   IR RADIOLOGIST EVAL & MGMT  03/18/2024   LAPAROSCOPY N/A 02/23/2024   Procedure: LAPAROSCOPY, DIAGNOSTIC;  Surgeon: Vernetta Berg, MD;  Location: [facility] OR;  Service: General;  Laterality: N/A;   LAPAROTOMY N/A 02/23/2024   Procedure: LAPAROTOMY, EXPLORATORY;  Surgeon: Vernetta Berg, MD;  Location: [facility] OR;  Service: General;  Laterality: N/A;   MOHS SURGERY  11/10/2009   basal cell carcinoma near eye   OOPHORECTOMY     SHOULDER SURGERY  04/12/2006   SMALL INTESTINE SURGERY  02/23/2024   Small bowel obstruction   TONSILLECTOMY  05/13/1974   UPPER  GASTROINTESTINAL ENDOSCOPY     Patient Active Problem List   Diagnosis Date Noted   Diarrhea 03/18/2024   H/O drainage of abscess 03/10/2024   Mild peripheral edema 03/10/2024   Ileus, postoperative (HCC) 03/10/2024   Pelvic abscess in female 03/08/2024   Debility 03/07/2024   Moderate protein-calorie malnutrition 02/27/2024   SBO (small bowel obstruction) (HCC) 02/20/2024   Back pain 12/23/2023   Adverse effect of metformin  09/30/2023   Hx of adenomatous polyp of colon 12/06/2022   Arm vein blood clot, right 12/26/2020   Estrogen deficiency 10/24/2020   History of pulmonary hypertension 06/08/2019   History of shingles 01/21/2018   Prediabetes 04/21/2015   Stress reaction 04/21/2015   Essential hypertension 04/21/2015   Migraine with aura 06/21/2013   Obesity 06/16/2011   Routine general medical examination at a health care facility 06/14/2011   SCIATICA 05/22/2009   GASTROESOPHAGEAL REFLUX DISEASE 04/14/2009   Factor 5 Leiden mutation, heterozygous 12/07/2007   Depression with anxiety 11/25/2006   IBS 11/25/2006   DVT, HX OF 11/25/2006   MIGRAINES, HX OF 11/25/2006   HYPERCHOLESTEROLEMIA, PURE 10/29/2006    PCP: [name] LABOR, MD PCP - General   REFERRING PROVIDER: Jerilynn Daphne SAILOR,  NP Ref Provider   REFERRING DIAG: R53.81 (ICD-10-CM) - Debility   THERAPY DIAG:  Muscle weakness (generalized)  Decreased activity tolerance  Rationale for Evaluation and Treatment: rehabilitation  ONSET DATE: October 9th to 31st (hospitilization for obstruction and subsequent surgeries)  SUBJECTIVE:   SUBJECTIVE STATEMENT: Doing well with HEP. My arms are weak when I need to reach for things over head.   EVAL: 22 days in hospital due to bowel obstruction and subsequent surgery. Lower body feels weaker than upper body. Some pain still with open incision site that is healing.   PERTINENT HISTORY: 2 knees with OA, open wound healing on abdomen with pain. Orthopedist says no  squats or lunges reportedly due to BL knees.   PAIN:  Are you having pain? No  PRECAUTIONS: None  RED FLAGS: None   WEIGHT BEARING RESTRICTIONS: No  FALLS:  Has patient fallen in last 6 months? No  OCCUPATION: n/a  PLOF: Independent  PATIENT GOALS: improve ADL completion, HEP  OBJECTIVE:  Note: Objective measures were completed at Evaluation unless otherwise noted.     PATIENT SURVEYS:  PSFS: THE PATIENT SPECIFIC FUNCTIONAL SCALE  Place score of 0-10 (0 = unable to perform activity and 10 = able to perform activity at the same level as before injury or problem)  Activity Date: 03/26/24    Walk long distances 5    2. Stand for extended period 5    3. Climb more than one set of home stairs 5    4.      Total Score 15      Total Score = Sum of activity scores/number of activities  Minimally Detectable Change: 3 points (for single activity); 2 points (for average score)  [facility] (nd). The Patient Specific Functional Scale . Retrieved from [employer].com.pt   COGNITION: Overall cognitive status: Within functional limits for tasks assessed     SENSATION: WFL   POSTURE: rounded shoulders, forward head, and increased lumbar lordosis  PALPATION: N/a   UE ROM: BL UE WFL  MMT 4- grossly  LOWER EXTREMITY ROM:  Active ROM Right eval Left eval  Hip flexion wfl wfl  Hip extension    Hip abduction wfl wfl  Hip adduction wfl wfl  Hip internal rotation    Hip external rotation    Knee flexion wfl wfl  Knee extension wfl wfl  Ankle dorsiflexion    Ankle plantarflexion    Ankle inversion    Ankle eversion     (Blank rows = not tested)  LOWER EXTREMITY MMT:  MMT Right eval Left eval  Hip flexion 4- 4-  Hip extension    Hip abduction 4- 4-  Hip adduction 4- 4-  Hip internal rotation    Hip external rotation    Knee flexion 4- 4-  Knee extension 4 4  Ankle dorsiflexion     Ankle plantarflexion    Ankle inversion    Ankle eversion     (Blank rows = not tested)  FUNCTIONAL TESTS  5x STS: 13.41 5 x STS w/ sitting back down at end (using BL UE for support)  TREATMENT DATE:   [facility] Adult PT Treatment:                                                DATE: 04/29/24 Therapeutic Exercise: YTB shoulder star pattern supine 6 x 2 3 sec  YTB yellow band pullovers 6 x 2  YTB Yellow band ER 3 sec 6 x 2  Standing Row Red  x 8 Seated Row 10# x 8    [facility] Adult PT Treatment:                                                DATE: 04/22/24 Therapeutic Exercise/Activity: HEP reassessment and update Seated Clamshell x6x3s GTB, blue TB 2x6x3s Seated LAQ x8x3s, x6x3s RTB x4x3s, x6x3s Seated marches 2x4x3s STS x5 lowest table RTB x6 CW/CCW circles RTB row x6x3s   Did not do: 2# scaption x8x3s, 3# x6x3s 3# shoulder press 2x8 RTB chest press x6x3s   [facility] Adult PT Treatment:                                                DATE: 04/15/24 Therapeutic Exercise: Nustep L4 x 6 minutes  Standing heel and toe raises Standing hip abductions x 8 each Standing March x 8 each  STS 5 x 2 from elevated seated  PPT / mini bridge 3 sec x 10  Ball squeeze 30 sec x 2 - (3 sec x 10) SL Clam AROM x 10 each  Seated scap squeeze 5 sec x 5      [facility] Adult PT Treatment:                                                DATE: 04/13/24 Therapeutic Exercise: Nustep L4 UE/LE x 5 minutes RTB clam  3 x 8 RTB march 3 x 8 LAQ 3 x 8 added RTB  Seated heel and toe raises Runners calf stretch  Mini Bridge 8 x 2  Update HEP     [facility] Adult PT Treatment:                                                DATE: 04/13/24 Therapeutic Exercise: Nustep L4 UE/LE x 5 minutes RTB clam  3 x 8 RTB march 3 x 8 LAQ 3 x 8 added RTB  Seated heel and toe raises Runners calf  stretch  Mini Bridge 8 x 2  Update HEP      TREATMENT 03/26/2024:  Therapeutic Exercise: RTB clamshell x8x3s RTB Hip marches x8x3s Seated YTB LAQ 2x6x3s  3# scaption 2x4x3s RTB row x6x3s RTB chest press x4x3s    Self-care/Home Management: Patient educated on HEP, POC, prognosis, and relevant tissues/anatomy.     PATIENT EDUCATION:  Education details: HEP Person educated: Patient Education method: Explanation, Facilities Manager, and Handouts Education comprehension: verbalized understanding  and returned demonstration  HOME EXERCISE PROGRAM: 5x/wk, 2x/day GTB clamshell x8x3s RTB Hip marches x8x3s Seated YTB LAQ 2x4x3s  3# scaption raise 2x4x3s RTB row x6x3s RTB chest press x4x3s YTB supine star pattern 8 x 2 YTB supine Shoulder ER 8 x 2    ASSESSMENT:  CLINICAL IMPRESSION: Pt arrives reporting weakness in shoulders limits her ability with OH reaching and activity. Began supine scapular bands and updated HEP.    Patient tolerated treatment with no significant increases in pain with progressions in whole body isolated and functional strengthening. Current deficits include: functional activity tolerance and whole body strength. As a result, patient would continue to benefit from skilled PT to address said deficits via plan below.    EVAL: Patient is a 68 year old female who presents with general whole body weakness due to 22 day hospital day due to bowel obstruction and subsequent surgeries. Patient presents with deficits in: general body strength (BL LE > BL UE) and functional activity tolerance. As a result, the patient would benefit from skilled PT to address aforementioned deficits via plan below.   OBJECTIVE IMPAIRMENTS: decreased activity tolerance, decreased balance, difficulty walking, decreased strength, improper body mechanics, postural dysfunction, obesity, and pain.   ACTIVITY LIMITATIONS: carrying, lifting, standing, squatting, and stairs  PERSONAL  FACTORS: Age, Fitness, Past/current experiences, Time since onset of injury/illness/exacerbation, and 1-2 comorbidities:   are also affecting patient's functional outcome.   REHAB POTENTIAL: Fair    CLINICAL DECISION MAKING: Stable/uncomplicated  EVALUATION COMPLEXITY: Moderate   GOALS: Goals reviewed with patient? No  SHORT TERM GOALS: Target date: 04/17/2024   1) Patient will demonstrate 75% HEP compliance to show independence with self-management of condition   Baseline: 0% Goal status: INITIAL     LONG TERM GOALS: Target date: 05/26/24   1) Patient will demonstrate 100% HEP compliance to show independence with self-management of condition   Baseline: 0% Goal status: INITIAL    3) Patient will demonstrate a 7 point improvement in PSFS to show improvements in ADL completion and overall QOL    Baseline: 15 Goal status: INITIAL  4) Patient will be able to perform ADLs with at least 85% capacity to demonstrate improvements in overall functional activity tolerance, strength, and QOL    Baseline: 20% Goal status: INITIAL      PLAN:  PT FREQUENCY: 1-2x/week  PT DURATION: 8 weeks  PLANNED INTERVENTIONS: 97110-Therapeutic exercises, 97530- Therapeutic activity, 97112- Neuromuscular re-education, 97535- Self Care, [id number]- Manual therapy, and Patient/Family education  PLAN FOR NEXT SESSION: HEP assessment and progression, symptom modulation, and loading (isolated and/or functional). Manual therapy, aerobic, gait, and NME training as needed. Functional strengthening (lower body> upper body) as tolerated based on sx.   Harlene Persons, PTA 04/29/2024 4:04 PM Phone: [phone] Fax: [phone]

## 2024-05-04 ENCOUNTER — Ambulatory Visit

## 2024-05-04 DIAGNOSIS — M6281 Muscle weakness (generalized): Secondary | ICD-10-CM

## 2024-05-04 DIAGNOSIS — R6889 Other general symptoms and signs: Secondary | ICD-10-CM

## 2024-05-04 DIAGNOSIS — R5381 Other malaise: Secondary | ICD-10-CM

## 2024-05-04 NOTE — Therapy (Signed)
 " OUTPATIENT PHYSICAL THERAPY LOWER EXTREMITY TREATMENT   Patient Name: Kristine [name] MRN: [id number] DOB:[birth year], 68 y.o., female Today's Date: 05/04/2024  END OF SESSION:  PT End of Session - 05/04/24 1146     Visit Number 7    Number of Visits 16    Date for Recertification  05/26/24    Authorization Type Medicare A and B    PT Start Time 1100    PT Stop Time 1140    PT Time Calculation (min) 40 min    Activity Tolerance Patient tolerated treatment well             Past Medical History:  Diagnosis Date   Allergy    Anxiety    Arthritis 2025   Toe/knee   Basal cell carcinoma    face   Cataract 2019   Clotting disorder 2009   Factor 5   Depression 1989   DVT (deep venous thrombosis) (HCC) 05/13/2006   Esophageal erosions    Factor V Leiden    On coag x 1 year ending in 09   Gastritis 05/13/1988   hospital- abd pain   GERD (gastroesophageal reflux disease)    Hx of adenomatous polyp of colon 12/06/2022   5 mm rectal adenoma repeat colonoscopy 2031    Hyperlipidemia    Hypertension    Radial head fracture 05/13/2009   Past Surgical History:  Procedure Laterality Date   BIOPSY BREAST Right 10/2022   BREAST SURGERY  2006   Ductal Papilloma/2023 breast biopsy-benign/2024 breast biopsy -benign   CESAREAN SECTION  1987   CHOLECYSTECTOMY  01/27/2003   COLONOSCOPY     DILATION AND CURETTAGE OF UTERUS  08/22/2022   IR CV LINE INJECTION  03/10/2024   IR RADIOLOGIST EVAL & MGMT  03/18/2024   LAPAROSCOPY N/A 02/23/2024   Procedure: LAPAROSCOPY, DIAGNOSTIC;  Surgeon: Vernetta Berg, MD;  Location: [facility] OR;  Service: General;  Laterality: N/A;   LAPAROTOMY N/A 02/23/2024   Procedure: LAPAROTOMY, EXPLORATORY;  Surgeon: Vernetta Berg, MD;  Location: [facility] OR;  Service: General;  Laterality: N/A;   MOHS SURGERY  11/10/2009   basal cell carcinoma near eye   OOPHORECTOMY     SHOULDER SURGERY  04/12/2006   SMALL INTESTINE SURGERY  02/23/2024   Small  bowel obstruction   TONSILLECTOMY  05/13/1974   UPPER GASTROINTESTINAL ENDOSCOPY     Patient Active Problem List   Diagnosis Date Noted   Diarrhea 03/18/2024   H/O drainage of abscess 03/10/2024   Mild peripheral edema 03/10/2024   Ileus, postoperative (HCC) 03/10/2024   Pelvic abscess in female 03/08/2024   Debility 03/07/2024   Moderate protein-calorie malnutrition 02/27/2024   SBO (small bowel obstruction) (HCC) 02/20/2024   Back pain 12/23/2023   Adverse effect of metformin  09/30/2023   Hx of adenomatous polyp of colon 12/06/2022   Arm vein blood clot, right 12/26/2020   Estrogen deficiency 10/24/2020   History of pulmonary hypertension 06/08/2019   History of shingles 01/21/2018   Prediabetes 04/21/2015   Stress reaction 04/21/2015   Essential hypertension 04/21/2015   Migraine with aura 06/21/2013   Obesity 06/16/2011   Routine general medical examination at a health care facility 06/14/2011   SCIATICA 05/22/2009   GASTROESOPHAGEAL REFLUX DISEASE 04/14/2009   Factor 5 Leiden mutation, heterozygous 12/07/2007   Depression with anxiety 11/25/2006   IBS 11/25/2006   DVT, HX OF 11/25/2006   MIGRAINES, HX OF 11/25/2006   HYPERCHOLESTEROLEMIA, PURE 10/29/2006    PCP: [name] LABOR,  MD PCP - General   REFERRING PROVIDER: Jerilynn Daphne SAILOR, NP Ref Provider   REFERRING DIAG: R53.81 (ICD-10-CM) - Debility   THERAPY DIAG:  Muscle weakness (generalized)  Decreased activity tolerance  Debility  Rationale for Evaluation and Treatment: rehabilitation  ONSET DATE: October 9th to 31st (hospitilization for obstruction and subsequent surgeries)  SUBJECTIVE:   SUBJECTIVE STATEMENT: 2/10 pain with both knees in patellar region esp with stairs. HEP been going well (upper body exercises going well). R calf/peroneal area has been bugging her still when stepping up.   EVAL: 22 days in hospital due to bowel obstruction and subsequent surgery. Lower body feels weaker than  upper body. Some pain still with open incision site that is healing.   PERTINENT HISTORY: 2 knees with OA, open wound healing on abdomen with pain. Orthopedist says no squats or lunges reportedly due to BL knees.   PAIN:  Are you having pain? No  PRECAUTIONS: None  RED FLAGS: None   WEIGHT BEARING RESTRICTIONS: No  FALLS:  Has patient fallen in last 6 months? No  OCCUPATION: n/a  PLOF: Independent  PATIENT GOALS: improve ADL completion, HEP  OBJECTIVE:  Note: Objective measures were completed at Evaluation unless otherwise noted.     PATIENT SURVEYS:  PSFS: THE PATIENT SPECIFIC FUNCTIONAL SCALE  Place score of 0-10 (0 = unable to perform activity and 10 = able to perform activity at the same level as before injury or problem)  Activity Date: 03/26/24    Walk long distances 5    2. Stand for extended period 5    3. Climb more than one set of home stairs 5    4.      Total Score 15      Total Score = Sum of activity scores/number of activities  Minimally Detectable Change: 3 points (for single activity); 2 points (for average score)  [facility] (nd). The Patient Specific Functional Scale . Retrieved from [email or web address]   COGNITION: Overall cognitive status: Within functional limits for tasks assessed     SENSATION: WFL   POSTURE: rounded shoulders, forward head, and increased lumbar lordosis  PALPATION: N/a   UE ROM: BL UE WFL  MMT 4- grossly  LOWER EXTREMITY ROM:  Active ROM Right eval Left eval  Hip flexion wfl wfl  Hip extension    Hip abduction wfl wfl  Hip adduction wfl wfl  Hip internal rotation    Hip external rotation    Knee flexion wfl wfl  Knee extension wfl wfl  Ankle dorsiflexion    Ankle plantarflexion    Ankle inversion    Ankle eversion     (Blank rows = not tested)  LOWER EXTREMITY MMT:  MMT Right eval Left eval  Hip flexion 4- 4-  Hip  extension    Hip abduction 4- 4-  Hip adduction 4- 4-  Hip internal rotation    Hip external rotation    Knee flexion 4- 4-  Knee extension 4 4  Ankle dorsiflexion    Ankle plantarflexion    Ankle inversion    Ankle eversion     (Blank rows = not tested)  FUNCTIONAL TESTS  5x STS: 13.41 5 x STS w/ sitting back down at end (using BL UE for support)  TREATMENT DATE:   Therapeutic Exercise/Activity HEP reassessment and update RTB eversion 2x8x3s BL 5# leg extension machine DC (p!) RTB LAQ x8x3s, GTB x6x3s Seated row 15# x8x3s Lat pulldown 15# x8x3s Chest press 5# x8   [facility] Adult PT Treatment:                                                DATE: 04/29/24 Therapeutic Exercise: YTB shoulder star pattern supine 6 x 2 3 sec  YTB yellow band pullovers 6 x 2  YTB Yellow band ER 3 sec 6 x 2  Standing Row Red  x 8 Seated Row 10# x 8    [facility] Adult PT Treatment:                                                DATE: 04/22/24 Therapeutic Exercise/Activity: HEP reassessment and update Seated Clamshell x6x3s GTB, blue TB 2x6x3s Seated LAQ x8x3s, x6x3s RTB x4x3s, x6x3s Seated marches 2x4x3s STS x5 lowest table RTB x6 CW/CCW circles RTB row x6x3s   Did not do: 2# scaption x8x3s, 3# x6x3s 3# shoulder press 2x8 RTB chest press x6x3s   [facility] Adult PT Treatment:                                                DATE: 04/15/24 Therapeutic Exercise: Nustep L4 x 6 minutes  Standing heel and toe raises Standing hip abductions x 8 each Standing March x 8 each  STS 5 x 2 from elevated seated  PPT / mini bridge 3 sec x 10  Ball squeeze 30 sec x 2 - (3 sec x 10) SL Clam AROM x 10 each  Seated scap squeeze 5 sec x 5      [facility] Adult PT Treatment:                                                DATE: 04/13/24 Therapeutic Exercise: Nustep L4 UE/LE x 5  minutes RTB clam  3 x 8 RTB march 3 x 8 LAQ 3 x 8 added RTB  Seated heel and toe raises Runners calf stretch  Mini Bridge 8 x 2  Update HEP     [facility] Adult PT Treatment:                                                DATE: 04/13/24 Therapeutic Exercise: Nustep L4 UE/LE x 5 minutes RTB clam  3 x 8 RTB march 3 x 8 LAQ 3 x 8 added RTB  Seated heel and toe raises Runners calf stretch  Mini Bridge 8 x 2  Update HEP      TREATMENT 03/26/2024:  Therapeutic Exercise: RTB clamshell x8x3s RTB Hip marches x8x3s Seated YTB LAQ 2x6x3s  3# scaption 2x4x3s RTB row x6x3s RTB chest press x4x3s  Self-care/Home Management: Patient educated on HEP, POC, prognosis, and relevant tissues/anatomy.     PATIENT EDUCATION:  Education details: HEP Person educated: Patient Education method: Programmer, Multimedia, Facilities Manager, and Handouts Education comprehension: verbalized understanding and returned demonstration  HOME EXERCISE PROGRAM: 5x/wk, 2x/day GTB clamshell x8x3s RTB Hip marches x8x3s Seated GTB LAQ 2x4x3s  Seated RTB eversion 2x8x3s  3# scaption raise 2x4x3s RTB row x6x3s RTB chest press x4x3s YTB supine star pattern 8 x 2 YTB supine Shoulder ER 8 x 2    ASSESSMENT:  CLINICAL IMPRESSION: Patient tolerated treatment with no significant increases in pain with progressions in BL ankle/calf, quad, UE, and upper back loading via isolated and functional loading. Current deficits include: functional activity tolerance and strength. As a result, patient would continue to benefit from skilled PT to address said deficits via plan below.      Patient tolerated treatment with no significant increases in pain with progressions in whole body isolated and functional strengthening. Current deficits include: functional activity tolerance and whole body strength. As a result, patient would continue to benefit from skilled PT to address said deficits via plan below.    EVAL: Patient is a 68  year old female who presents with general whole body weakness due to 22 day hospital day due to bowel obstruction and subsequent surgeries. Patient presents with deficits in: general body strength (BL LE > BL UE) and functional activity tolerance. As a result, the patient would benefit from skilled PT to address aforementioned deficits via plan below.   OBJECTIVE IMPAIRMENTS: decreased activity tolerance, decreased balance, difficulty walking, decreased strength, improper body mechanics, postural dysfunction, obesity, and pain.   ACTIVITY LIMITATIONS: carrying, lifting, standing, squatting, and stairs  PERSONAL FACTORS: Age, Fitness, Past/current experiences, Time since onset of injury/illness/exacerbation, and 1-2 comorbidities:   are also affecting patient's functional outcome.   REHAB POTENTIAL: Fair    CLINICAL DECISION MAKING: Stable/uncomplicated  EVALUATION COMPLEXITY: Moderate   GOALS: Goals reviewed with patient? No  SHORT TERM GOALS: Target date: 04/17/2024   1) Patient will demonstrate 75% HEP compliance to show independence with self-management of condition   Baseline: 0% Goal status: INITIAL     LONG TERM GOALS: Target date: 05/26/24   1) Patient will demonstrate 100% HEP compliance to show independence with self-management of condition   Baseline: 0% Goal status: INITIAL    3) Patient will demonstrate a 7 point improvement in PSFS to show improvements in ADL completion and overall QOL    Baseline: 15 Goal status: INITIAL  4) Patient will be able to perform ADLs with at least 85% capacity to demonstrate improvements in overall functional activity tolerance, strength, and QOL    Baseline: 20% Goal status: INITIAL      PLAN:  PT FREQUENCY: 1-2x/week  PT DURATION: 8 weeks  PLANNED INTERVENTIONS: 97110-Therapeutic exercises, 97530- Therapeutic activity, 97112- Neuromuscular re-education, 97535- Self Care, [id number]- Manual therapy, and  Patient/Family education  PLAN FOR NEXT SESSION: HEP assessment and progression, symptom modulation, and loading (isolated and/or functional). Manual therapy, aerobic, gait, and NME training as needed. Functional strengthening (lower body> upper body) as tolerated based on sx.   Kristine [name]  PT, DPT   "

## 2024-05-07 ENCOUNTER — Encounter: Payer: Self-pay | Admitting: Family Medicine

## 2024-05-07 MED ORDER — ACYCLOVIR 5 % EX OINT: 1.0000 | TOPICAL_OINTMENT | CUTANEOUS | 3 refills | Status: AC

## 2024-05-10 ENCOUNTER — Ambulatory Visit

## 2024-05-10 DIAGNOSIS — R6889 Other general symptoms and signs: Secondary | ICD-10-CM

## 2024-05-10 DIAGNOSIS — M6281 Muscle weakness (generalized): Secondary | ICD-10-CM | POA: Diagnosis not present

## 2024-05-10 DIAGNOSIS — R5381 Other malaise: Secondary | ICD-10-CM

## 2024-05-10 NOTE — Therapy (Signed)
 " OUTPATIENT PHYSICAL THERAPY LOWER EXTREMITY TREATMENT   Patient Name: Kristine [name] MRN: [id number] DOB:30-Jan-1956, 68 y.o., female Today's Date: 05/10/2024  END OF SESSION:  PT End of Session - 05/10/24 1140     Visit Number 8    Number of Visits 16    Date for Recertification  05/26/24    Authorization Type Medicare A and B    PT Start Time 1100    PT Stop Time 1140    PT Time Calculation (min) 40 min    Activity Tolerance Patient tolerated treatment well              Past Medical History:  Diagnosis Date   Allergy    Anxiety    Arthritis 2025   Toe/knee   Basal cell carcinoma    face   Cataract 2019   Clotting disorder 2009   Factor 5   Depression 1989   DVT (deep venous thrombosis) (HCC) 05/13/2006   Esophageal erosions    Factor V Leiden    On coag x 1 year ending in 09   Gastritis 05/13/1988   hospital- abd pain   GERD (gastroesophageal reflux disease)    Hx of adenomatous polyp of colon 12/06/2022   5 mm rectal adenoma repeat colonoscopy 2031    Hyperlipidemia    Hypertension    Radial head fracture 05/13/2009   Past Surgical History:  Procedure Laterality Date   BIOPSY BREAST Right 10/2022   BREAST SURGERY  2006   Ductal Papilloma/2023 breast biopsy-benign/2024 breast biopsy -benign   CESAREAN SECTION  1987   CHOLECYSTECTOMY  01/27/2003   COLONOSCOPY     DILATION AND CURETTAGE OF UTERUS  08/22/2022   IR CV LINE INJECTION  03/10/2024   IR RADIOLOGIST EVAL & MGMT  03/18/2024   LAPAROSCOPY N/A 02/23/2024   Procedure: LAPAROSCOPY, DIAGNOSTIC;  Surgeon: Vernetta Berg, MD;  Location: [facility] OR;  Service: General;  Laterality: N/A;   LAPAROTOMY N/A 02/23/2024   Procedure: LAPAROTOMY, EXPLORATORY;  Surgeon: Vernetta Berg, MD;  Location: [facility] OR;  Service: General;  Laterality: N/A;   MOHS SURGERY  11/10/2009   basal cell carcinoma near eye   OOPHORECTOMY     SHOULDER SURGERY  04/12/2006   SMALL INTESTINE SURGERY  02/23/2024   Small  bowel obstruction   TONSILLECTOMY  05/13/1974   UPPER GASTROINTESTINAL ENDOSCOPY     Patient Active Problem List   Diagnosis Date Noted   Diarrhea 03/18/2024   H/O drainage of abscess 03/10/2024   Mild peripheral edema 03/10/2024   Ileus, postoperative (HCC) 03/10/2024   Pelvic abscess in female 03/08/2024   Debility 03/07/2024   Moderate protein-calorie malnutrition 02/27/2024   SBO (small bowel obstruction) (HCC) 02/20/2024   Back pain 12/23/2023   Adverse effect of metformin  09/30/2023   Hx of adenomatous polyp of colon 12/06/2022   Arm vein blood clot, right 12/26/2020   Estrogen deficiency 10/24/2020   History of pulmonary hypertension 06/08/2019   History of shingles 01/21/2018   Prediabetes 04/21/2015   Stress reaction 04/21/2015   Essential hypertension 04/21/2015   Migraine with aura 06/21/2013   Obesity 06/16/2011   Routine general medical examination at a health care facility 06/14/2011   SCIATICA 05/22/2009   GASTROESOPHAGEAL REFLUX DISEASE 04/14/2009   Factor 5 Leiden mutation, heterozygous 12/07/2007   Depression with anxiety 11/25/2006   IBS 11/25/2006   DVT, HX OF 11/25/2006   MIGRAINES, HX OF 11/25/2006   HYPERCHOLESTEROLEMIA, PURE 10/29/2006    PCP: Randeen Hardy  A, MD PCP - General   REFERRING PROVIDER: Jerilynn Daphne SAILOR, NP Ref Provider   REFERRING DIAG: R53.81 (ICD-10-CM) - Debility   THERAPY DIAG:  Decreased activity tolerance  Muscle weakness (generalized)  Debility  Rationale for Evaluation and Treatment: rehabilitation  ONSET DATE: October 9th to 31st (hospitilization for obstruction and subsequent surgeries)  SUBJECTIVE:   SUBJECTIVE STATEMENT: 2/10 pain with both knees in patellar region. HEP been going well (upper body exercises going well and knees not hurting as bad with exercises). R calf/peroneal area hasn't been hurting past 3 days; didn't feel like the leg has been giving out on stairs.   EVAL: 22 days in hospital due to  bowel obstruction and subsequent surgery. Lower body feels weaker than upper body. Some pain still with open incision site that is healing.   PERTINENT HISTORY: 2 knees with OA, open wound healing on abdomen with pain. Orthopedist says no squats or lunges reportedly due to BL knees.   PAIN:  Are you having pain? No  PRECAUTIONS: None  RED FLAGS: None   WEIGHT BEARING RESTRICTIONS: No  FALLS:  Has patient fallen in last 6 months? No  OCCUPATION: n/a  PLOF: Independent  PATIENT GOALS: improve ADL completion, HEP  OBJECTIVE:  Note: Objective measures were completed at Evaluation unless otherwise noted.     PATIENT SURVEYS:  PSFS: THE PATIENT SPECIFIC FUNCTIONAL SCALE  Place score of 0-10 (0 = unable to perform activity and 10 = able to perform activity at the same level as before injury or problem)  Activity Date: 03/26/24    Walk long distances 5    2. Stand for extended period 5    3. Climb more than one set of home stairs 5    4.      Total Score 15      Total Score = Sum of activity scores/number of activities  Minimally Detectable Change: 3 points (for single activity); 2 points (for average score)  [facility] (nd). The Patient Specific Functional Scale . Retrieved from [email or web address]   COGNITION: Overall cognitive status: Within functional limits for tasks assessed     SENSATION: WFL   POSTURE: rounded shoulders, forward head, and increased lumbar lordosis  PALPATION: N/a   UE ROM: BL UE WFL  MMT 4- grossly  LOWER EXTREMITY ROM:  Active ROM Right eval Left eval  Hip flexion wfl wfl  Hip extension    Hip abduction wfl wfl  Hip adduction wfl wfl  Hip internal rotation    Hip external rotation    Knee flexion wfl wfl  Knee extension wfl wfl  Ankle dorsiflexion    Ankle plantarflexion    Ankle inversion    Ankle eversion     (Blank rows = not tested)  LOWER  EXTREMITY MMT:  MMT Right eval Left eval  Hip flexion 4- 4-  Hip extension    Hip abduction 4- 4-  Hip adduction 4- 4-  Hip internal rotation    Hip external rotation    Knee flexion 4- 4-  Knee extension 4 4  Ankle dorsiflexion    Ankle plantarflexion    Ankle inversion    Ankle eversion     (Blank rows = not tested)  FUNCTIONAL TESTS  5x STS: 13.41 5 x STS w/ sitting back down at end (using BL UE for support)  TREATMENT DATE:   05/10/24 Therapeutic Exercise/Activity HEP reassessment and update Nustep L4 x6 min Leg curl x8x3s 10#, x8x3s 20# Chest press 15# 2x8x3s Lat pulldown 20# 2x6 LAQ GTB x8x3s     Therapeutic Exercise/Activity HEP reassessment and update RTB eversion 2x8x3s BL 5# leg extension machine DC (p!) RTB LAQ x8x3s, GTB x6x3s Seated row 15# x8x3s Lat pulldown 15# x8x3s Chest press 5# x8   [facility] Adult PT Treatment:                                                DATE: 04/29/24 Therapeutic Exercise: YTB shoulder star pattern supine 6 x 2 3 sec  YTB yellow band pullovers 6 x 2  YTB Yellow band ER 3 sec 6 x 2  Standing Row Red  x 8 Seated Row 10# x 8    [facility] Adult PT Treatment:                                                DATE: 04/22/24 Therapeutic Exercise/Activity: HEP reassessment and update Seated Clamshell x6x3s GTB, blue TB 2x6x3s Seated LAQ x8x3s, x6x3s RTB x4x3s, x6x3s Seated marches 2x4x3s STS x5 lowest table RTB x6 CW/CCW circles RTB row x6x3s   Did not do: 2# scaption x8x3s, 3# x6x3s 3# shoulder press 2x8 RTB chest press x6x3s   [facility] Adult PT Treatment:                                                DATE: 04/15/24 Therapeutic Exercise: Nustep L4 x 6 minutes  Standing heel and toe raises Standing hip abductions x 8 each Standing March x 8 each  STS 5 x 2 from elevated seated  PPT / mini  bridge 3 sec x 10  Ball squeeze 30 sec x 2 - (3 sec x 10) SL Clam AROM x 10 each  Seated scap squeeze 5 sec x 5        Self-care/Home Management: Patient educated on HEP, POC, prognosis, and relevant tissues/anatomy.     PATIENT EDUCATION:  Education details: HEP Person educated: Patient Education method: Programmer, Multimedia, Facilities Manager, and Handouts Education comprehension: verbalized understanding and returned demonstration  HOME EXERCISE PROGRAM: 5x/wk, 2x/day GTB clamshell x8x3s RTB Hip marches x8x3s Seated GTB LAQ 2x4x3s  Seated RTB eversion 2x8x3s  3# scaption raise 2x4x3s RTB row x6x3s RTB chest press x4x3s YTB supine star pattern 8 x 2 YTB supine Shoulder ER 8 x 2    ASSESSMENT:  CLINICAL IMPRESSION: Patient tolerated treatment with no significant increases in pain with progressions in BL ankle/calf, quad, UE, and upper back loading via isolated and functional loading. Current deficits include: functional activity tolerance and strength. As a result, patient would continue to benefit from skilled PT to address said deficits via plan below.      Patient tolerated treatment with no significant increases in pain with progressions in whole body isolated and functional strengthening. Current deficits include: functional activity tolerance and whole body strength. As a result, patient would continue to benefit from skilled PT to address said deficits via plan below.  EVAL: Patient is a 68 year old female who presents with general whole body weakness due to 22 day hospital day due to bowel obstruction and subsequent surgeries. Patient presents with deficits in: general body strength (BL LE > BL UE) and functional activity tolerance. As a result, the patient would benefit from skilled PT to address aforementioned deficits via plan below.   OBJECTIVE IMPAIRMENTS: decreased activity tolerance, decreased balance, difficulty walking, decreased strength, improper body  mechanics, postural dysfunction, obesity, and pain.   ACTIVITY LIMITATIONS: carrying, lifting, standing, squatting, and stairs  PERSONAL FACTORS: Age, Fitness, Past/current experiences, Time since onset of injury/illness/exacerbation, and 1-2 comorbidities:   are also affecting patient's functional outcome.   REHAB POTENTIAL: Fair    CLINICAL DECISION MAKING: Stable/uncomplicated  EVALUATION COMPLEXITY: Moderate   GOALS: Goals reviewed with patient? No  SHORT TERM GOALS: Target date: 04/17/2024   1) Patient will demonstrate 75% HEP compliance to show independence with self-management of condition   Baseline: 0% Goal status: INITIAL     LONG TERM GOALS: Target date: 05/26/24   1) Patient will demonstrate 100% HEP compliance to show independence with self-management of condition   Baseline: 0% Goal status: INITIAL    3) Patient will demonstrate a 7 point improvement in PSFS to show improvements in ADL completion and overall QOL    Baseline: 15 Goal status: INITIAL  4) Patient will be able to perform ADLs with at least 85% capacity to demonstrate improvements in overall functional activity tolerance, strength, and QOL    Baseline: 20% Goal status: INITIAL      PLAN:  PT FREQUENCY: 1-2x/week  PT DURATION: 8 weeks  PLANNED INTERVENTIONS: 97110-Therapeutic exercises, 97530- Therapeutic activity, 97112- Neuromuscular re-education, 97535- Self Care, [id number]- Manual therapy, and Patient/Family education  PLAN FOR NEXT SESSION: HEP assessment and progression, symptom modulation, and loading (isolated and/or functional). Manual therapy, aerobic, gait, and NME training as needed. Functional strengthening (lower body> upper body) as tolerated based on sx.   [place] Odessia Scot  PT, DPT   "

## 2024-05-17 ENCOUNTER — Ambulatory Visit: Attending: Student

## 2024-05-17 DIAGNOSIS — R5381 Other malaise: Secondary | ICD-10-CM | POA: Diagnosis present

## 2024-05-17 DIAGNOSIS — R6889 Other general symptoms and signs: Secondary | ICD-10-CM | POA: Diagnosis present

## 2024-05-17 DIAGNOSIS — M6281 Muscle weakness (generalized): Secondary | ICD-10-CM | POA: Insufficient documentation

## 2024-05-17 NOTE — Therapy (Signed)
 " OUTPATIENT PHYSICAL THERAPY LOWER EXTREMITY TREATMENT   Patient Name: Kristine [name] MRN: [id number] DOB:02-Sep-1955, 69 y.o., female Today's Date: 05/17/2024  END OF SESSION:  PT End of Session - 05/17/24 1533     Visit Number 9    Number of Visits 16    Date for Recertification  05/26/24    Authorization Type Medicare A and B    PT Start Time 1532    PT Stop Time 1615    PT Time Calculation (min) 43 min    Activity Tolerance Patient tolerated treatment well              Past Medical History:  Diagnosis Date   Allergy    Anxiety    Arthritis 2025   Toe/knee   Basal cell carcinoma    face   Cataract 2019   Clotting disorder 2009   Factor 5   Depression 1989   DVT (deep venous thrombosis) (HCC) 05/13/2006   Esophageal erosions    Factor V Leiden    On coag x 1 year ending in 09   Gastritis 05/13/1988   hospital- abd pain   GERD (gastroesophageal reflux disease)    Hx of adenomatous polyp of colon 12/06/2022   5 mm rectal adenoma repeat colonoscopy 2031    Hyperlipidemia    Hypertension    Radial head fracture 05/13/2009   Past Surgical History:  Procedure Laterality Date   BIOPSY BREAST Right 10/2022   BREAST SURGERY  2006   Ductal Papilloma/2023 breast biopsy-benign/2024 breast biopsy -benign   CESAREAN SECTION  1987   CHOLECYSTECTOMY  01/27/2003   COLONOSCOPY     DILATION AND CURETTAGE OF UTERUS  08/22/2022   IR CV LINE INJECTION  03/10/2024   IR RADIOLOGIST EVAL & MGMT  03/18/2024   LAPAROSCOPY N/A 02/23/2024   Procedure: LAPAROSCOPY, DIAGNOSTIC;  Surgeon: Vernetta Berg, MD;  Location: [facility] OR;  Service: General;  Laterality: N/A;   LAPAROTOMY N/A 02/23/2024   Procedure: LAPAROTOMY, EXPLORATORY;  Surgeon: Vernetta Berg, MD;  Location: [facility] OR;  Service: General;  Laterality: N/A;   MOHS SURGERY  11/10/2009   basal cell carcinoma near eye   OOPHORECTOMY     SHOULDER SURGERY  04/12/2006   SMALL INTESTINE SURGERY  02/23/2024   Small  bowel obstruction   TONSILLECTOMY  05/13/1974   UPPER GASTROINTESTINAL ENDOSCOPY     Patient Active Problem List   Diagnosis Date Noted   Diarrhea 03/18/2024   H/O drainage of abscess 03/10/2024   Mild peripheral edema 03/10/2024   Ileus, postoperative (HCC) 03/10/2024   Pelvic abscess in female 03/08/2024   Debility 03/07/2024   Moderate protein-calorie malnutrition 02/27/2024   SBO (small bowel obstruction) (HCC) 02/20/2024   Back pain 12/23/2023   Adverse effect of metformin  09/30/2023   Hx of adenomatous polyp of colon 12/06/2022   Arm vein blood clot, right 12/26/2020   Estrogen deficiency 10/24/2020   History of pulmonary hypertension 06/08/2019   History of shingles 01/21/2018   Prediabetes 04/21/2015   Stress reaction 04/21/2015   Essential hypertension 04/21/2015   Migraine with aura 06/21/2013   Obesity 06/16/2011   Routine general medical examination at a health care facility 06/14/2011   SCIATICA 05/22/2009   GASTROESOPHAGEAL REFLUX DISEASE 04/14/2009   Factor 5 Leiden mutation, heterozygous 12/07/2007   Depression with anxiety 11/25/2006   IBS 11/25/2006   DVT, HX OF 11/25/2006   MIGRAINES, HX OF 11/25/2006   HYPERCHOLESTEROLEMIA, PURE 10/29/2006    PCP: Randeen Hardy  A, MD PCP - General   REFERRING PROVIDER: Jerilynn Daphne SAILOR, NP Ref Provider   REFERRING DIAG: R53.81 (ICD-10-CM) - Debility   THERAPY DIAG:  Decreased activity tolerance  Muscle weakness (generalized)  Debility  Rationale for Evaluation and Treatment: rehabilitation  ONSET DATE: October 9th to 31st (hospitilization for obstruction and subsequent surgeries)  SUBJECTIVE:   SUBJECTIVE STATEMENT: 2/10 pain with both knees in patellar region. HEP been going well (upper body exercises going well and knees not hurting as bad with exercises). Got sick after last visit and not able to do too much.   EVAL: 22 days in hospital due to bowel obstruction and subsequent surgery. Lower body  feels weaker than upper body. Some pain still with open incision site that is healing.   PERTINENT HISTORY: 2 knees with OA, open wound healing on abdomen with pain. Orthopedist says no squats or lunges reportedly due to BL knees.   PAIN:  Are you having pain? No  PRECAUTIONS: None  RED FLAGS: None   WEIGHT BEARING RESTRICTIONS: No  FALLS:  Has patient fallen in last 6 months? No  OCCUPATION: n/a  PLOF: Independent  PATIENT GOALS: improve ADL completion, HEP  OBJECTIVE:  Note: Objective measures were completed at Evaluation unless otherwise noted.     PATIENT SURVEYS:  PSFS: THE PATIENT SPECIFIC FUNCTIONAL SCALE  Place score of 0-10 (0 = unable to perform activity and 10 = able to perform activity at the same level as before injury or problem)  Activity Date: 03/26/24    Walk long distances 5    2. Stand for extended period 5    3. Climb more than one set of home stairs 5    4.      Total Score 15      Total Score = Sum of activity scores/number of activities  Minimally Detectable Change: 3 points (for single activity); 2 points (for average score)  [facility] (nd). The Patient Specific Functional Scale . Retrieved from [email or web address]   COGNITION: Overall cognitive status: Within functional limits for tasks assessed     SENSATION: WFL   POSTURE: rounded shoulders, forward head, and increased lumbar lordosis  PALPATION: N/a   UE ROM: BL UE WFL  MMT 4- grossly  LOWER EXTREMITY ROM:  Active ROM Right eval Left eval  Hip flexion wfl wfl  Hip extension    Hip abduction wfl wfl  Hip adduction wfl wfl  Hip internal rotation    Hip external rotation    Knee flexion wfl wfl  Knee extension wfl wfl  Ankle dorsiflexion    Ankle plantarflexion    Ankle inversion    Ankle eversion     (Blank rows = not tested)  LOWER EXTREMITY MMT:  MMT Right eval Left eval  Hip flexion  4- 4-  Hip extension    Hip abduction 4- 4-  Hip adduction 4- 4-  Hip internal rotation    Hip external rotation    Knee flexion 4- 4-  Knee extension 4 4  Ankle dorsiflexion    Ankle plantarflexion    Ankle inversion    Ankle eversion     (Blank rows = not tested)  FUNCTIONAL TESTS  5x STS: 13.41 5 x STS w/ sitting back down at end (using BL UE for support)  TREATMENT DATE:   05/17/24: Therapeutic Exercise/Activity:  6 min nustep L5 HEP reassessment and update Chest press x5 #, x8x3s 15#, 2x6x3s 20# Row machine 15# x6x3s, 20# x6x3s Lat pull down 25# x6 Leg press 15# x8, 35# x6   05/10/24 Therapeutic Exercise/Activity HEP reassessment and update Nustep L4 x6 min Leg curl x8x3s 10#, x8x3s 20# Chest press 15# 2x8x3s Lat pulldown 20# 2x6 LAQ GTB x8x3s     Therapeutic Exercise/Activity HEP reassessment and update RTB eversion 2x8x3s BL 5# leg extension machine DC (p!) RTB LAQ x8x3s, GTB x6x3s Seated row 15# x8x3s Lat pulldown 15# x8x3s Chest press 5# x8   [facility] Adult PT Treatment:                                                DATE: 04/29/24 Therapeutic Exercise: YTB shoulder star pattern supine 6 x 2 3 sec  YTB yellow band pullovers 6 x 2  YTB Yellow band ER 3 sec 6 x 2  Standing Row Red  x 8 Seated Row 10# x 8    [facility] Adult PT Treatment:                                                DATE: 04/22/24 Therapeutic Exercise/Activity: HEP reassessment and update Seated Clamshell x6x3s GTB, blue TB 2x6x3s Seated LAQ x8x3s, x6x3s RTB x4x3s, x6x3s Seated marches 2x4x3s STS x5 lowest table RTB x6 CW/CCW circles RTB row x6x3s   Did not do: 2# scaption x8x3s, 3# x6x3s 3# shoulder press 2x8 RTB chest press x6x3s   [facility] Adult PT Treatment:                                                DATE: 04/15/24 Therapeutic Exercise: Nustep  L4 x 6 minutes  Standing heel and toe raises Standing hip abductions x 8 each Standing March x 8 each  STS 5 x 2 from elevated seated  PPT / mini bridge 3 sec x 10  Ball squeeze 30 sec x 2 - (3 sec x 10) SL Clam AROM x 10 each  Seated scap squeeze 5 sec x 5        Self-care/Home Management: Patient educated on HEP, POC, prognosis, and relevant tissues/anatomy.     PATIENT EDUCATION:  Education details: HEP Person educated: Patient Education method: Programmer, Multimedia, Facilities Manager, and Handouts Education comprehension: verbalized understanding and returned demonstration  HOME EXERCISE PROGRAM: 5x/wk, 2x/day GTB clamshell x8x3s RTB Hip marches x8x3s Seated GTB LAQ 2x4x3s  Seated RTB eversion 2x8x3s  3# scaption raise 2x4x3s RTB row x6x3s RTB chest press x4x3s YTB supine star pattern 8 x 2 YTB supine Shoulder ER 8 x 2    ASSESSMENT:  CLINICAL IMPRESSION: Patient tolerated treatment with no significant increases in pain with progressions in BL ankle/calf, quad, UE, and upper back loading via isolated and functional loading. Current deficits include: functional activity tolerance and strength. As a result, patient would continue to benefit from skilled PT to address said deficits via plan below.        EVAL: Patient is a 69 year old  female who presents with general whole body weakness due to 22 day hospital day due to bowel obstruction and subsequent surgeries. Patient presents with deficits in: general body strength (BL LE > BL UE) and functional activity tolerance. As a result, the patient would benefit from skilled PT to address aforementioned deficits via plan below.   OBJECTIVE IMPAIRMENTS: decreased activity tolerance, decreased balance, difficulty walking, decreased strength, improper body mechanics, postural dysfunction, obesity, and pain.   ACTIVITY LIMITATIONS: carrying, lifting, standing, squatting, and stairs  PERSONAL FACTORS: Age, Fitness, Past/current  experiences, Time since onset of injury/illness/exacerbation, and 1-2 comorbidities:   are also affecting patient's functional outcome.   REHAB POTENTIAL: Fair    CLINICAL DECISION MAKING: Stable/uncomplicated  EVALUATION COMPLEXITY: Moderate   GOALS: Goals reviewed with patient? No  SHORT TERM GOALS: Target date: 04/17/2024   1) Patient will demonstrate 75% HEP compliance to show independence with self-management of condition   Baseline: 0% Goal status: INITIAL     LONG TERM GOALS: Target date: 05/26/24   1) Patient will demonstrate 100% HEP compliance to show independence with self-management of condition   Baseline: 0% Goal status: INITIAL    3) Patient will demonstrate a 7 point improvement in PSFS to show improvements in ADL completion and overall QOL    Baseline: 15 Goal status: INITIAL  4) Patient will be able to perform ADLs with at least 85% capacity to demonstrate improvements in overall functional activity tolerance, strength, and QOL    Baseline: 20% Goal status: INITIAL      PLAN:  PT FREQUENCY: 1-2x/week  PT DURATION: 8 weeks  PLANNED INTERVENTIONS: 97110-Therapeutic exercises, 97530- Therapeutic activity, 97112- Neuromuscular re-education, 97535- Self Care, [id number]- Manual therapy, and Patient/Family education  PLAN FOR NEXT SESSION: HEP assessment and progression, symptom modulation, and loading (isolated and/or functional). Manual therapy, aerobic, gait, and NME training as needed. Functional strengthening (lower body> upper body) as tolerated based on sx.   [place] Odessia Scot  PT, DPT   "

## 2024-05-18 ENCOUNTER — Other Ambulatory Visit (HOSPITAL_COMMUNITY): Payer: Self-pay

## 2024-05-19 ENCOUNTER — Ambulatory Visit

## 2024-05-25 ENCOUNTER — Ambulatory Visit

## 2024-05-25 DIAGNOSIS — M6281 Muscle weakness (generalized): Secondary | ICD-10-CM

## 2024-05-25 DIAGNOSIS — R5381 Other malaise: Secondary | ICD-10-CM

## 2024-05-25 DIAGNOSIS — R6889 Other general symptoms and signs: Secondary | ICD-10-CM

## 2024-05-25 NOTE — Therapy (Signed)
 " OUTPATIENT PHYSICAL THERAPY LOWER EXTREMITY TREATMENT PHYSICAL THERAPY DISCHARGE SUMMARY  Visits from Start of Care: 10  Current functional level related to goals / functional outcomes: N/a   Remaining deficits: N/a   Education / Equipment: HEP   Patient agrees to discharge. Patient goals were met. Patient is being discharged due to meeting the stated rehab goals.   Patient Name: Kristine [name] MRN: [id number] DOB:January 11, 1956, 69 y.o., female Today's Date: 05/25/2024  END OF SESSION:  PT End of Session - 05/25/24 1155     Visit Number 10    Number of Visits 16    Date for Recertification  05/26/24    Authorization Type Medicare A and B    PT Start Time 1150    PT Stop Time 1220    PT Time Calculation (min) 30 min    Activity Tolerance Patient tolerated treatment well               Past Medical History:  Diagnosis Date   Allergy    Anxiety    Arthritis 2025   Toe/knee   Basal cell carcinoma    face   Cataract 2019   Clotting disorder 2009   Factor 5   Depression 1989   DVT (deep venous thrombosis) (HCC) 05/13/2006   Esophageal erosions    Factor V Leiden    On coag x 1 year ending in 09   Gastritis 05/13/1988   hospital- abd pain   GERD (gastroesophageal reflux disease)    Hx of adenomatous polyp of colon 12/06/2022   5 mm rectal adenoma repeat colonoscopy 2031    Hyperlipidemia    Hypertension    Radial head fracture 05/13/2009   Past Surgical History:  Procedure Laterality Date   BIOPSY BREAST Right 10/2022   BREAST SURGERY  2006   Ductal Papilloma/2023 breast biopsy-benign/2024 breast biopsy -benign   CESAREAN SECTION  1987   CHOLECYSTECTOMY  01/27/2003   COLONOSCOPY     DILATION AND CURETTAGE OF UTERUS  08/22/2022   IR CV LINE INJECTION  03/10/2024   IR RADIOLOGIST EVAL & MGMT  03/18/2024   LAPAROSCOPY N/A 02/23/2024   Procedure: LAPAROSCOPY, DIAGNOSTIC;  Surgeon: Vernetta Berg, MD;  Location: [facility] OR;  Service: General;   Laterality: N/A;   LAPAROTOMY N/A 02/23/2024   Procedure: LAPAROTOMY, EXPLORATORY;  Surgeon: Vernetta Berg, MD;  Location: [facility] OR;  Service: General;  Laterality: N/A;   MOHS SURGERY  11/10/2009   basal cell carcinoma near eye   OOPHORECTOMY     SHOULDER SURGERY  04/12/2006   SMALL INTESTINE SURGERY  02/23/2024   Small bowel obstruction   TONSILLECTOMY  05/13/1974   UPPER GASTROINTESTINAL ENDOSCOPY     Patient Active Problem List   Diagnosis Date Noted   Diarrhea 03/18/2024   H/O drainage of abscess 03/10/2024   Mild peripheral edema 03/10/2024   Ileus, postoperative (HCC) 03/10/2024   Pelvic abscess in female 03/08/2024   Debility 03/07/2024   Moderate protein-calorie malnutrition 02/27/2024   SBO (small bowel obstruction) (HCC) 02/20/2024   Back pain 12/23/2023   Adverse effect of metformin  09/30/2023   Hx of adenomatous polyp of colon 12/06/2022   Arm vein blood clot, right 12/26/2020   Estrogen deficiency 10/24/2020   History of pulmonary hypertension 06/08/2019   History of shingles 01/21/2018   Prediabetes 04/21/2015   Stress reaction 04/21/2015   Essential hypertension 04/21/2015   Migraine with aura 06/21/2013   Obesity 06/16/2011   Routine general medical examination at a  health care facility 06/14/2011   SCIATICA 05/22/2009   GASTROESOPHAGEAL REFLUX DISEASE 04/14/2009   Factor 5 Leiden mutation, heterozygous 12/07/2007   Depression with anxiety 11/25/2006   IBS 11/25/2006   DVT, HX OF 11/25/2006   MIGRAINES, HX OF 11/25/2006   HYPERCHOLESTEROLEMIA, PURE 10/29/2006    PCP: [name] LABOR, MD PCP - General   REFERRING PROVIDER: Jerilynn Daphne SAILOR, NP Ref Provider   REFERRING DIAG: R53.81 (ICD-10-CM) - Debility   THERAPY DIAG:  Decreased activity tolerance  Muscle weakness (generalized)  Debility  Rationale for Evaluation and Treatment: rehabilitation  ONSET DATE: October 9th to 31st (hospitilization for obstruction and subsequent  surgeries)  SUBJECTIVE:   SUBJECTIVE STATEMENT: Doing well today and feeling ready for discharge  EVAL: 22 days in hospital due to bowel obstruction and subsequent surgery. Lower body feels weaker than upper body. Some pain still with open incision site that is healing.   PERTINENT HISTORY: 2 knees with OA, open wound healing on abdomen with pain. Orthopedist says no squats or lunges reportedly due to BL knees.   PAIN:  Are you having pain? No  PRECAUTIONS: None  RED FLAGS: None   WEIGHT BEARING RESTRICTIONS: No  FALLS:  Has patient fallen in last 6 months? No  OCCUPATION: n/a  PLOF: Independent  PATIENT GOALS: improve ADL completion, HEP  OBJECTIVE:  Note: Objective measures were completed at Evaluation unless otherwise noted.     PATIENT SURVEYS:  PSFS: THE PATIENT SPECIFIC FUNCTIONAL SCALE  Place score of 0-10 (0 = unable to perform activity and 10 = able to perform activity at the same level as before injury or problem)  Activity Date: 03/26/24, 05/25/24    Walk long distances 5    2. Stand for extended period 5    3. Climb more than one set of home stairs 5    4.      Total Score 15, 22      Total Score = Sum of activity scores/number of activities  Minimally Detectable Change: 3 points (for single activity); 2 points (for average score)  [facility] (nd). The Patient Specific Functional Scale . Retrieved from [email or web address]   COGNITION: Overall cognitive status: Within functional limits for tasks assessed     SENSATION: WFL   POSTURE: rounded shoulders, forward head, and increased lumbar lordosis  PALPATION: N/a   UE ROM: BL UE WFL  MMT 4- grossly, 4/4+ grossly   LOWER EXTREMITY ROM:  Active ROM Right eval Left eval  Hip flexion wfl wfl  Hip extension    Hip abduction wfl wfl  Hip adduction wfl wfl  Hip internal rotation    Hip external rotation    Knee  flexion wfl wfl  Knee extension wfl wfl  Ankle dorsiflexion    Ankle plantarflexion    Ankle inversion    Ankle eversion     (Blank rows = not tested)  LOWER EXTREMITY MMT:   Reassessed 05/25/24  MMT Right eval Left eval  Hip flexion 4-, 4 4-, 4  Hip extension    Hip abduction 4-, 4 4-, 4  Hip adduction 4-, 4+ 4-, 4+  Hip internal rotation    Hip external rotation    Knee flexion 4-, 4+ 4-, 4  Knee extension 4, 4+ 4, 4+   Ankle dorsiflexion    Ankle plantarflexion    Ankle inversion    Ankle eversion     (Blank rows = not tested)  FUNCTIONAL TESTS  5x STS: 13.41  5 x STS w/ sitting back down at end (using BL UE for support)                                                                                                                                  TREATMENT DATE:   05/25/24: Therapeutic Activity:  Discharge; functional reassessment and goal update   05/17/24: Therapeutic Exercise/Activity:  6 min nustep L5 HEP reassessment and update Chest press x5 #, x8x3s 15#, 2x6x3s 20# Row machine 15# x6x3s, 20# x6x3s Lat pull down 25# x6 Leg press 15# x8, 35# x6   05/10/24 Therapeutic Exercise/Activity HEP reassessment and update Nustep L4 x6 min Leg curl x8x3s 10#, x8x3s 20# Chest press 15# 2x8x3s Lat pulldown 20# 2x6 LAQ GTB x8x3s     Therapeutic Exercise/Activity HEP reassessment and update RTB eversion 2x8x3s BL 5# leg extension machine DC (p!) RTB LAQ x8x3s, GTB x6x3s Seated row 15# x8x3s Lat pulldown 15# x8x3s Chest press 5# x8   [facility] Adult PT Treatment:                                                DATE: 04/29/24 Therapeutic Exercise: YTB shoulder star pattern supine 6 x 2 3 sec  YTB yellow band pullovers 6 x 2  YTB Yellow band ER 3 sec 6 x 2  Standing Row Red  x 8 Seated Row 10# x 8    [facility] Adult PT Treatment:                                                DATE: 04/22/24 Therapeutic Exercise/Activity: HEP reassessment and  update Seated Clamshell x6x3s GTB, blue TB 2x6x3s Seated LAQ x8x3s, x6x3s RTB x4x3s, x6x3s Seated marches 2x4x3s STS x5 lowest table RTB x6 CW/CCW circles RTB row x6x3s   Did not do: 2# scaption x8x3s, 3# x6x3s 3# shoulder press 2x8 RTB chest press x6x3s   [facility] Adult PT Treatment:                                                DATE: 04/15/24 Therapeutic Exercise: Nustep L4 x 6 minutes  Standing heel and toe raises Standing hip abductions x 8 each Standing March x 8 each  STS 5 x 2 from elevated seated  PPT / mini bridge 3 sec x 10  Ball squeeze 30 sec x 2 - (3 sec x 10) SL Clam AROM x 10 each  Seated scap squeeze 5 sec x 5        Self-care/Home Management:  Patient educated on HEP, POC, prognosis, and relevant tissues/anatomy.     PATIENT EDUCATION:  Education details: HEP Person educated: Patient Education method: Programmer, Multimedia, Facilities Manager, and Handouts Education comprehension: verbalized understanding and returned demonstration  HOME EXERCISE PROGRAM: 5x/wk, 2x/day GTB clamshell x8x3s RTB Hip marches x8x3s Seated GTB LAQ 2x4x3s  Seated RTB eversion 2x8x3s  3# scaption raise 2x4x3s RTB row x6x3s RTB chest press x4x3s YTB supine star pattern 8 x 2 YTB supine Shoulder ER 8 x 2    ASSESSMENT:  CLINICAL IMPRESSION: Patient improved in functional strength and activity tolerance for both BL UE and LE. All goals met and pt is pleased with progress. Pt discharged from PT.       EVAL: Patient is a 69 year old female who presents with general whole body weakness due to 22 day hospital day due to bowel obstruction and subsequent surgeries. Patient presents with deficits in: general body strength (BL LE > BL UE) and functional activity tolerance. As a result, the patient would benefit from skilled PT to address aforementioned deficits via plan below.   OBJECTIVE IMPAIRMENTS: decreased activity tolerance, decreased balance, difficulty walking, decreased  strength, improper body mechanics, postural dysfunction, obesity, and pain.   ACTIVITY LIMITATIONS: carrying, lifting, standing, squatting, and stairs  PERSONAL FACTORS: Age, Fitness, Past/current experiences, Time since onset of injury/illness/exacerbation, and 1-2 comorbidities:   are also affecting patient's functional outcome.   REHAB POTENTIAL: Fair    CLINICAL DECISION MAKING: Stable/uncomplicated  EVALUATION COMPLEXITY: Moderate   GOALS: Goals reviewed with patient? No  SHORT TERM GOALS: Target date: 04/17/2024   1) Patient will demonstrate 75% HEP compliance to show independence with self-management of condition   Baseline: 0% Goal status: MET     LONG TERM GOALS: Target date: 05/26/24   1) Patient will demonstrate 100% HEP compliance to show independence with self-management of condition   Baseline: 0% Goal status: MET    3) Patient will demonstrate a 7 point improvement in PSFS to show improvements in ADL completion and overall QOL    Baseline: 15 Goal status: MET  4) Patient will be able to perform ADLs with at least 85% capacity to demonstrate improvements in overall functional activity tolerance, strength, and QOL    Baseline: 20% Goal status: MET      PLAN:  PT FREQUENCY: 1-2x/week  PT DURATION: 8 weeks  PLANNED INTERVENTIONS: 97110-Therapeutic exercises, 97530- Therapeutic activity, [id number]- Neuromuscular re-education, 97535- Self Care, [id number]- Manual therapy, and Patient/Family education  PLAN FOR NEXT SESSION: Pt DC   [place] Odessia Scot  PT, DPT   "

## 2024-05-26 ENCOUNTER — Ambulatory Visit

## 2024-10-22 ENCOUNTER — Ambulatory Visit
